# Patient Record
Sex: Female | Born: 1960 | Race: Black or African American | Hispanic: No | Smoking: Former smoker
Health system: Southern US, Community
[De-identification: ages and names within clinical notes are randomized; demographics above are authoritative.]

## PROBLEM LIST (undated history)

## (undated) DIAGNOSIS — I341 Nonrheumatic mitral (valve) prolapse: Secondary | ICD-10-CM

## (undated) DIAGNOSIS — R7303 Prediabetes: Secondary | ICD-10-CM

## (undated) DIAGNOSIS — G459 Transient cerebral ischemic attack, unspecified: Secondary | ICD-10-CM

## (undated) DIAGNOSIS — M5136 Other intervertebral disc degeneration, lumbar region: Secondary | ICD-10-CM

## (undated) DIAGNOSIS — I10 Essential (primary) hypertension: Secondary | ICD-10-CM

## (undated) DIAGNOSIS — M51369 Other intervertebral disc degeneration, lumbar region without mention of lumbar back pain or lower extremity pain: Secondary | ICD-10-CM

## (undated) DIAGNOSIS — K219 Gastro-esophageal reflux disease without esophagitis: Secondary | ICD-10-CM

## (undated) DIAGNOSIS — R569 Unspecified convulsions: Secondary | ICD-10-CM

## (undated) HISTORY — PX: JOINT REPLACEMENT: SHX530

## (undated) HISTORY — PX: BACK SURGERY: SHX140

## (undated) HISTORY — PX: ABDOMINAL HYSTERECTOMY: SHX81

## (undated) HISTORY — PX: KNEE SURGERY: SHX244

## (undated) HISTORY — PX: CHOLECYSTECTOMY: SHX55

---

## 1998-04-01 ENCOUNTER — Emergency Department (HOSPITAL_COMMUNITY): Admission: EM | Admit: 1998-04-01 | Discharge: 1998-04-01 | Payer: Self-pay | Admitting: Emergency Medicine

## 1998-04-13 ENCOUNTER — Other Ambulatory Visit: Admission: RE | Admit: 1998-04-13 | Discharge: 1998-04-13 | Payer: Self-pay | Admitting: Anesthesiology

## 1998-05-29 ENCOUNTER — Ambulatory Visit (HOSPITAL_COMMUNITY): Admission: RE | Admit: 1998-05-29 | Discharge: 1998-05-29 | Payer: Self-pay | Admitting: *Deleted

## 1998-06-04 ENCOUNTER — Encounter: Admission: RE | Admit: 1998-06-04 | Discharge: 1998-09-02 | Payer: Self-pay | Admitting: Family Medicine

## 1998-06-25 ENCOUNTER — Encounter: Payer: Self-pay | Admitting: Emergency Medicine

## 1998-06-25 ENCOUNTER — Emergency Department (HOSPITAL_COMMUNITY): Admission: EM | Admit: 1998-06-25 | Discharge: 1998-06-25 | Payer: Self-pay | Admitting: *Deleted

## 1998-11-10 ENCOUNTER — Emergency Department (HOSPITAL_COMMUNITY): Admission: EM | Admit: 1998-11-10 | Discharge: 1998-11-10 | Payer: Self-pay

## 1998-11-14 ENCOUNTER — Ambulatory Visit (HOSPITAL_COMMUNITY): Admission: RE | Admit: 1998-11-14 | Discharge: 1998-11-14 | Payer: Self-pay | Admitting: Family Medicine

## 1998-11-14 ENCOUNTER — Encounter: Payer: Self-pay | Admitting: Family Medicine

## 1998-12-10 ENCOUNTER — Ambulatory Visit (HOSPITAL_COMMUNITY): Admission: RE | Admit: 1998-12-10 | Discharge: 1998-12-10 | Payer: Self-pay | Admitting: Family Medicine

## 1999-05-31 ENCOUNTER — Ambulatory Visit (HOSPITAL_COMMUNITY): Admission: RE | Admit: 1999-05-31 | Discharge: 1999-06-01 | Payer: Self-pay | Admitting: Orthopaedic Surgery

## 1999-07-24 ENCOUNTER — Emergency Department (HOSPITAL_COMMUNITY): Admission: EM | Admit: 1999-07-24 | Discharge: 1999-07-24 | Payer: Self-pay | Admitting: Emergency Medicine

## 1999-07-24 ENCOUNTER — Encounter: Payer: Self-pay | Admitting: Emergency Medicine

## 1999-07-31 ENCOUNTER — Encounter: Admission: RE | Admit: 1999-07-31 | Discharge: 1999-10-29 | Payer: Self-pay | Admitting: Orthopaedic Surgery

## 1999-08-04 ENCOUNTER — Encounter: Payer: Self-pay | Admitting: Emergency Medicine

## 1999-08-04 ENCOUNTER — Emergency Department (HOSPITAL_COMMUNITY): Admission: EM | Admit: 1999-08-04 | Discharge: 1999-08-04 | Payer: Self-pay | Admitting: Emergency Medicine

## 2000-01-10 ENCOUNTER — Emergency Department (HOSPITAL_COMMUNITY): Admission: EM | Admit: 2000-01-10 | Discharge: 2000-01-10 | Payer: Self-pay | Admitting: Emergency Medicine

## 2000-01-10 ENCOUNTER — Encounter: Payer: Self-pay | Admitting: Emergency Medicine

## 2000-05-28 ENCOUNTER — Emergency Department (HOSPITAL_COMMUNITY): Admission: EM | Admit: 2000-05-28 | Discharge: 2000-05-28 | Payer: Self-pay | Admitting: Emergency Medicine

## 2001-07-17 ENCOUNTER — Encounter: Payer: Self-pay | Admitting: *Deleted

## 2001-07-17 ENCOUNTER — Emergency Department (HOSPITAL_COMMUNITY): Admission: EM | Admit: 2001-07-17 | Discharge: 2001-07-17 | Payer: Self-pay | Admitting: *Deleted

## 2001-07-21 ENCOUNTER — Encounter: Admission: RE | Admit: 2001-07-21 | Discharge: 2001-08-18 | Payer: Self-pay | Admitting: Orthopaedic Surgery

## 2002-04-18 ENCOUNTER — Other Ambulatory Visit: Admission: RE | Admit: 2002-04-18 | Discharge: 2002-04-18 | Payer: Self-pay | Admitting: Internal Medicine

## 2002-08-06 ENCOUNTER — Encounter: Payer: Self-pay | Admitting: Emergency Medicine

## 2002-08-06 ENCOUNTER — Emergency Department (HOSPITAL_COMMUNITY): Admission: EM | Admit: 2002-08-06 | Discharge: 2002-08-06 | Payer: Self-pay | Admitting: Emergency Medicine

## 2003-03-01 ENCOUNTER — Encounter
Admission: RE | Admit: 2003-03-01 | Discharge: 2003-05-09 | Payer: Self-pay | Admitting: Physical Medicine and Rehabilitation

## 2003-03-20 ENCOUNTER — Emergency Department (HOSPITAL_COMMUNITY): Admission: EM | Admit: 2003-03-20 | Discharge: 2003-03-20 | Payer: Self-pay | Admitting: Emergency Medicine

## 2003-04-10 ENCOUNTER — Encounter: Payer: Self-pay | Admitting: Internal Medicine

## 2003-04-10 ENCOUNTER — Ambulatory Visit (HOSPITAL_COMMUNITY): Admission: RE | Admit: 2003-04-10 | Discharge: 2003-04-10 | Payer: Self-pay | Admitting: Internal Medicine

## 2003-05-27 ENCOUNTER — Emergency Department (HOSPITAL_COMMUNITY): Admission: EM | Admit: 2003-05-27 | Discharge: 2003-05-27 | Payer: Self-pay | Admitting: Emergency Medicine

## 2003-11-17 ENCOUNTER — Other Ambulatory Visit: Admission: RE | Admit: 2003-11-17 | Discharge: 2003-11-17 | Payer: Self-pay | Admitting: Family Medicine

## 2003-11-21 ENCOUNTER — Encounter
Admission: RE | Admit: 2003-11-21 | Discharge: 2003-11-21 | Payer: Self-pay | Admitting: Physical Medicine and Rehabilitation

## 2003-11-23 ENCOUNTER — Ambulatory Visit (HOSPITAL_COMMUNITY): Admission: RE | Admit: 2003-11-23 | Discharge: 2003-11-23 | Payer: Self-pay | Admitting: Family Medicine

## 2004-01-13 ENCOUNTER — Emergency Department (HOSPITAL_COMMUNITY): Admission: EM | Admit: 2004-01-13 | Discharge: 2004-01-14 | Payer: Self-pay | Admitting: Emergency Medicine

## 2004-03-15 ENCOUNTER — Emergency Department (HOSPITAL_COMMUNITY): Admission: EM | Admit: 2004-03-15 | Discharge: 2004-03-15 | Payer: Self-pay | Admitting: *Deleted

## 2004-04-14 ENCOUNTER — Emergency Department (HOSPITAL_COMMUNITY): Admission: EM | Admit: 2004-04-14 | Discharge: 2004-04-14 | Payer: Self-pay | Admitting: Emergency Medicine

## 2004-06-21 ENCOUNTER — Ambulatory Visit: Payer: Self-pay | Admitting: *Deleted

## 2004-08-05 ENCOUNTER — Ambulatory Visit: Payer: Self-pay | Admitting: Nurse Practitioner

## 2004-09-03 ENCOUNTER — Ambulatory Visit: Payer: Self-pay | Admitting: Nurse Practitioner

## 2004-09-03 ENCOUNTER — Ambulatory Visit: Payer: Self-pay | Admitting: *Deleted

## 2004-11-11 ENCOUNTER — Ambulatory Visit: Payer: Self-pay | Admitting: Nurse Practitioner

## 2004-11-19 ENCOUNTER — Ambulatory Visit: Payer: Self-pay | Admitting: Nurse Practitioner

## 2004-12-05 ENCOUNTER — Ambulatory Visit: Payer: Self-pay | Admitting: Nurse Practitioner

## 2004-12-24 ENCOUNTER — Ambulatory Visit: Payer: Self-pay | Admitting: Nurse Practitioner

## 2005-01-08 ENCOUNTER — Ambulatory Visit (HOSPITAL_COMMUNITY): Admission: RE | Admit: 2005-01-08 | Discharge: 2005-01-08 | Payer: Self-pay | Admitting: Family Medicine

## 2005-05-01 ENCOUNTER — Ambulatory Visit: Payer: Self-pay | Admitting: Nurse Practitioner

## 2005-05-03 ENCOUNTER — Ambulatory Visit (HOSPITAL_COMMUNITY): Admission: RE | Admit: 2005-05-03 | Discharge: 2005-05-03 | Payer: Self-pay | Admitting: Internal Medicine

## 2005-06-05 ENCOUNTER — Ambulatory Visit: Payer: Self-pay | Admitting: Nurse Practitioner

## 2005-06-11 ENCOUNTER — Ambulatory Visit: Payer: Self-pay | Admitting: Nurse Practitioner

## 2005-06-18 ENCOUNTER — Ambulatory Visit (HOSPITAL_COMMUNITY): Admission: RE | Admit: 2005-06-18 | Discharge: 2005-06-18 | Payer: Self-pay | Admitting: Internal Medicine

## 2005-06-27 ENCOUNTER — Ambulatory Visit: Payer: Self-pay | Admitting: Nurse Practitioner

## 2005-07-21 ENCOUNTER — Encounter: Admission: RE | Admit: 2005-07-21 | Discharge: 2005-10-19 | Payer: Self-pay | Admitting: Sports Medicine

## 2005-09-04 ENCOUNTER — Ambulatory Visit: Payer: Self-pay | Admitting: Nurse Practitioner

## 2005-09-30 ENCOUNTER — Ambulatory Visit: Payer: Self-pay | Admitting: Nurse Practitioner

## 2005-10-04 ENCOUNTER — Ambulatory Visit (HOSPITAL_COMMUNITY): Admission: RE | Admit: 2005-10-04 | Discharge: 2005-10-04 | Payer: Self-pay | Admitting: Internal Medicine

## 2005-10-08 ENCOUNTER — Ambulatory Visit: Payer: Self-pay | Admitting: Nurse Practitioner

## 2006-01-06 ENCOUNTER — Ambulatory Visit: Payer: Self-pay | Admitting: Nurse Practitioner

## 2006-05-22 ENCOUNTER — Ambulatory Visit: Payer: Self-pay | Admitting: Nurse Practitioner

## 2006-05-24 ENCOUNTER — Emergency Department (HOSPITAL_COMMUNITY): Admission: EM | Admit: 2006-05-24 | Discharge: 2006-05-24 | Payer: Self-pay | Admitting: Emergency Medicine

## 2006-05-26 ENCOUNTER — Ambulatory Visit: Payer: Self-pay | Admitting: Nurse Practitioner

## 2006-06-06 ENCOUNTER — Emergency Department (HOSPITAL_COMMUNITY): Admission: EM | Admit: 2006-06-06 | Discharge: 2006-06-06 | Payer: Self-pay | Admitting: Emergency Medicine

## 2006-07-07 ENCOUNTER — Ambulatory Visit: Payer: Self-pay | Admitting: Nurse Practitioner

## 2006-08-12 ENCOUNTER — Ambulatory Visit: Payer: Self-pay | Admitting: Family Medicine

## 2006-09-11 ENCOUNTER — Ambulatory Visit: Payer: Self-pay | Admitting: Internal Medicine

## 2006-10-29 ENCOUNTER — Ambulatory Visit (HOSPITAL_COMMUNITY): Admission: RE | Admit: 2006-10-29 | Discharge: 2006-10-29 | Payer: Self-pay | Admitting: Obstetrics and Gynecology

## 2006-11-02 ENCOUNTER — Ambulatory Visit: Payer: Self-pay | Admitting: Obstetrics and Gynecology

## 2006-11-02 ENCOUNTER — Inpatient Hospital Stay (HOSPITAL_COMMUNITY): Admission: RE | Admit: 2006-11-02 | Discharge: 2006-11-04 | Payer: Self-pay | Admitting: Obstetrics and Gynecology

## 2006-11-02 ENCOUNTER — Encounter (INDEPENDENT_AMBULATORY_CARE_PROVIDER_SITE_OTHER): Payer: Self-pay | Admitting: Specialist

## 2006-11-11 ENCOUNTER — Ambulatory Visit: Payer: Self-pay | Admitting: Nurse Practitioner

## 2006-11-18 ENCOUNTER — Ambulatory Visit: Payer: Self-pay | Admitting: Obstetrics and Gynecology

## 2006-11-23 ENCOUNTER — Ambulatory Visit: Payer: Self-pay | Admitting: Nurse Practitioner

## 2006-12-07 ENCOUNTER — Emergency Department (HOSPITAL_COMMUNITY): Admission: EM | Admit: 2006-12-07 | Discharge: 2006-12-07 | Payer: Self-pay | Admitting: *Deleted

## 2006-12-16 ENCOUNTER — Ambulatory Visit: Payer: Self-pay | Admitting: Obstetrics & Gynecology

## 2007-02-10 ENCOUNTER — Ambulatory Visit: Payer: Self-pay | Admitting: Nurse Practitioner

## 2007-02-26 DIAGNOSIS — J309 Allergic rhinitis, unspecified: Secondary | ICD-10-CM | POA: Insufficient documentation

## 2007-02-26 DIAGNOSIS — F419 Anxiety disorder, unspecified: Secondary | ICD-10-CM | POA: Insufficient documentation

## 2007-02-26 DIAGNOSIS — K219 Gastro-esophageal reflux disease without esophagitis: Secondary | ICD-10-CM | POA: Insufficient documentation

## 2007-03-11 ENCOUNTER — Ambulatory Visit: Payer: Self-pay | Admitting: Internal Medicine

## 2007-04-05 ENCOUNTER — Encounter: Admission: RE | Admit: 2007-04-05 | Discharge: 2007-05-13 | Payer: Self-pay | Admitting: Sports Medicine

## 2007-05-11 ENCOUNTER — Ambulatory Visit: Payer: Self-pay | Admitting: Internal Medicine

## 2007-05-12 ENCOUNTER — Ambulatory Visit: Payer: Self-pay | Admitting: Family Medicine

## 2007-06-02 ENCOUNTER — Encounter (INDEPENDENT_AMBULATORY_CARE_PROVIDER_SITE_OTHER): Payer: Self-pay | Admitting: *Deleted

## 2007-06-04 ENCOUNTER — Ambulatory Visit: Payer: Self-pay | Admitting: Internal Medicine

## 2007-07-12 ENCOUNTER — Ambulatory Visit: Payer: Self-pay | Admitting: Family Medicine

## 2007-08-09 ENCOUNTER — Ambulatory Visit: Payer: Self-pay | Admitting: Internal Medicine

## 2007-09-05 ENCOUNTER — Emergency Department (HOSPITAL_COMMUNITY): Admission: EM | Admit: 2007-09-05 | Discharge: 2007-09-05 | Payer: Self-pay | Admitting: Emergency Medicine

## 2007-09-12 ENCOUNTER — Emergency Department (HOSPITAL_COMMUNITY): Admission: EM | Admit: 2007-09-12 | Discharge: 2007-09-12 | Payer: Self-pay | Admitting: Emergency Medicine

## 2007-10-21 ENCOUNTER — Ambulatory Visit: Payer: Self-pay | Admitting: Internal Medicine

## 2007-11-10 ENCOUNTER — Ambulatory Visit: Payer: Self-pay | Admitting: Family Medicine

## 2007-12-31 ENCOUNTER — Ambulatory Visit: Payer: Self-pay | Admitting: Internal Medicine

## 2008-06-23 ENCOUNTER — Ambulatory Visit: Payer: Self-pay | Admitting: *Deleted

## 2008-06-23 ENCOUNTER — Observation Stay (HOSPITAL_COMMUNITY): Admission: EM | Admit: 2008-06-23 | Discharge: 2008-06-24 | Payer: Self-pay | Admitting: Emergency Medicine

## 2008-07-02 ENCOUNTER — Emergency Department (HOSPITAL_COMMUNITY): Admission: EM | Admit: 2008-07-02 | Discharge: 2008-07-02 | Payer: Self-pay | Admitting: Emergency Medicine

## 2008-07-20 ENCOUNTER — Emergency Department (HOSPITAL_COMMUNITY): Admission: EM | Admit: 2008-07-20 | Discharge: 2008-07-20 | Payer: Self-pay | Admitting: Family Medicine

## 2008-08-07 ENCOUNTER — Ambulatory Visit: Payer: Self-pay | Admitting: Family Medicine

## 2008-08-07 ENCOUNTER — Encounter: Payer: Self-pay | Admitting: Family Medicine

## 2008-08-07 DIAGNOSIS — I10 Essential (primary) hypertension: Secondary | ICD-10-CM | POA: Insufficient documentation

## 2008-08-07 LAB — CONVERTED CEMR LAB
ALT: 13 units/L (ref 0–35)
AST: 12 units/L (ref 0–37)
Albumin: 4.8 g/dL (ref 3.5–5.2)
Alkaline Phosphatase: 61 units/L (ref 39–117)
BUN: 13 mg/dL (ref 6–23)
CO2: 24 meq/L (ref 19–32)
Calcium: 9.7 mg/dL (ref 8.4–10.5)
Chloride: 101 meq/L (ref 96–112)
Cholesterol: 245 mg/dL — ABNORMAL HIGH (ref 0–200)
Creatinine, Ser: 0.7 mg/dL (ref 0.40–1.20)
Glucose, Bld: 100 mg/dL — ABNORMAL HIGH (ref 70–99)
HDL: 45 mg/dL (ref >39)
LDL Cholesterol: 154 mg/dL — ABNORMAL HIGH (ref 0–99)
Potassium: 4.3 meq/L (ref 3.5–5.3)
Sodium: 138 meq/L (ref 135–145)
Total Bilirubin: 0.6 mg/dL (ref 0.3–1.2)
Total CHOL/HDL Ratio: 5.4
Total Protein: 7.3 g/dL (ref 6.0–8.3)
Triglycerides: 232 mg/dL — ABNORMAL HIGH (ref <150)
VLDL: 46 mg/dL — ABNORMAL HIGH (ref 0–40)

## 2008-08-14 ENCOUNTER — Telehealth: Payer: Self-pay | Admitting: Family Medicine

## 2008-08-14 ENCOUNTER — Encounter: Payer: Self-pay | Admitting: *Deleted

## 2008-08-17 ENCOUNTER — Telehealth: Payer: Self-pay | Admitting: Family Medicine

## 2008-08-18 ENCOUNTER — Emergency Department (HOSPITAL_COMMUNITY): Admission: EM | Admit: 2008-08-18 | Discharge: 2008-08-18 | Payer: Self-pay | Admitting: Emergency Medicine

## 2008-08-30 ENCOUNTER — Encounter: Payer: Self-pay | Admitting: Family Medicine

## 2008-09-13 ENCOUNTER — Encounter: Payer: Self-pay | Admitting: Family Medicine

## 2008-10-03 ENCOUNTER — Telehealth: Payer: Self-pay | Admitting: *Deleted

## 2008-10-06 ENCOUNTER — Ambulatory Visit (HOSPITAL_COMMUNITY): Admission: RE | Admit: 2008-10-06 | Discharge: 2008-10-06 | Payer: Self-pay | Admitting: Family Medicine

## 2009-01-08 ENCOUNTER — Ambulatory Visit: Payer: Self-pay | Admitting: Family Medicine

## 2009-02-13 ENCOUNTER — Ambulatory Visit: Payer: Self-pay | Admitting: Family Medicine

## 2009-02-13 DIAGNOSIS — E669 Obesity, unspecified: Secondary | ICD-10-CM | POA: Insufficient documentation

## 2009-02-15 DIAGNOSIS — M129 Arthropathy, unspecified: Secondary | ICD-10-CM | POA: Insufficient documentation

## 2009-02-16 ENCOUNTER — Ambulatory Visit: Payer: Self-pay | Admitting: Cardiology

## 2009-02-23 ENCOUNTER — Encounter: Payer: Self-pay | Admitting: Cardiology

## 2009-02-23 ENCOUNTER — Ambulatory Visit: Payer: Self-pay

## 2009-03-16 ENCOUNTER — Telehealth (INDEPENDENT_AMBULATORY_CARE_PROVIDER_SITE_OTHER): Payer: Self-pay | Admitting: Family Medicine

## 2009-03-23 ENCOUNTER — Ambulatory Visit: Payer: Self-pay | Admitting: Family Medicine

## 2009-05-03 ENCOUNTER — Ambulatory Visit: Payer: Self-pay | Admitting: Family Medicine

## 2009-05-03 DIAGNOSIS — R51 Headache: Secondary | ICD-10-CM | POA: Insufficient documentation

## 2009-05-03 DIAGNOSIS — R519 Headache, unspecified: Secondary | ICD-10-CM | POA: Insufficient documentation

## 2009-07-06 ENCOUNTER — Telehealth: Payer: Self-pay | Admitting: *Deleted

## 2010-06-05 ENCOUNTER — Ambulatory Visit: Payer: Self-pay | Admitting: Family Medicine

## 2010-06-05 DIAGNOSIS — N951 Menopausal and female climacteric states: Secondary | ICD-10-CM | POA: Insufficient documentation

## 2010-06-06 ENCOUNTER — Encounter: Payer: Self-pay | Admitting: Family Medicine

## 2010-06-06 LAB — CONVERTED CEMR LAB
Progesterone: 1 ng/mL
TSH: 2.576 microintl units/mL (ref 0.350–4.500)

## 2010-06-13 ENCOUNTER — Telehealth: Payer: Self-pay | Admitting: Family Medicine

## 2010-07-03 ENCOUNTER — Ambulatory Visit: Payer: Self-pay | Admitting: Family Medicine

## 2010-07-03 DIAGNOSIS — H538 Other visual disturbances: Secondary | ICD-10-CM | POA: Insufficient documentation

## 2010-07-05 ENCOUNTER — Encounter: Payer: Self-pay | Admitting: Family Medicine

## 2010-07-11 ENCOUNTER — Ambulatory Visit: Payer: Self-pay | Admitting: Family Medicine

## 2010-07-11 DIAGNOSIS — G4700 Insomnia, unspecified: Secondary | ICD-10-CM | POA: Insufficient documentation

## 2010-07-11 HISTORY — DX: Insomnia, unspecified: G47.00

## 2010-07-26 ENCOUNTER — Encounter: Payer: Self-pay | Admitting: Family Medicine

## 2010-07-26 ENCOUNTER — Telehealth: Payer: Self-pay | Admitting: Family Medicine

## 2010-09-20 ENCOUNTER — Ambulatory Visit: Admission: RE | Admit: 2010-09-20 | Discharge: 2010-09-20 | Payer: Self-pay | Source: Home / Self Care

## 2010-09-20 DIAGNOSIS — H1045 Other chronic allergic conjunctivitis: Secondary | ICD-10-CM | POA: Insufficient documentation

## 2010-10-01 ENCOUNTER — Ambulatory Visit: Admit: 2010-10-01 | Payer: Self-pay | Admitting: Family Medicine

## 2010-10-06 ENCOUNTER — Encounter: Payer: Self-pay | Admitting: Neurosurgery

## 2010-10-06 ENCOUNTER — Encounter: Payer: Self-pay | Admitting: Internal Medicine

## 2010-10-06 ENCOUNTER — Encounter: Payer: Self-pay | Admitting: Family Medicine

## 2010-10-06 ENCOUNTER — Encounter: Payer: Self-pay | Admitting: *Deleted

## 2010-10-14 ENCOUNTER — Encounter
Admission: RE | Admit: 2010-10-14 | Discharge: 2010-10-15 | Payer: Self-pay | Source: Home / Self Care | Attending: Sports Medicine | Admitting: Sports Medicine

## 2010-10-17 NOTE — Miscellaneous (Signed)
Summary: VIP  Patient: Mariah [name] Note: All result statuses are Final unless otherwise noted.  Tests: (1) VIP (Medications)   LLIMPORTMEDS              "Result Below..."       RESULT: PROTONIX TBEC 40 MG*TAKE ONE (1) TABLET BY MOUTH EVERY DAY*07/13/2007*Last Refill: [id number]*******   LLIMPORTALLS              "Result Below..."       RESULT: SULFITE, SULFATE***  Note: An exclamation mark (!) indicates a result that was not dispersed into the flowsheet. Document Creation Date: 07/15/2007 3:05 PM _______________________________________________________________________  (1) Order result status: Final Collection or observation date-time: 06/02/2007 Requested date-time: 06/02/2007 Receipt date-time:  Reported date-time: 06/02/2007 Referring Physician:   Ordering Physician:   Specimen Source:  Source: [facility] [place] Order Number:  Lab site:

## 2010-10-17 NOTE — Assessment & Plan Note (Signed)
Summary: nutr. appt per saunders/eo   Vital Signs:  Patient profile:   50 year old female Height:      65 inches Weight:      226 pounds BMI:     37.74  Vitals Entered By: Wyona Almas PHD (July 11, 2010 2:03 PM)  Primary Care Provider:  Angelena Sole   History of Present Illness: 1. F/U on obesity and nutrition.  Spent > 30 minutes in counseling, education, and discussing the treatment plan.  She has been compliant with the diet for the most part over the past 7 days  24 hour recall: Breakfast: 4 egg whites / 2 slices bacon + water + coffee with [place] creamer Snack: Pistachios Lunch: Skipped Snack: None Dinner: baked chicken with vegetables  Cravings: starchy foods  2. Cough:  She has also had a cough over the past couple days and is looking for something to help break up the congestion in her chest  ROS: denies wheezing, fever, or shortness of breath  3. Insomnia:  Pt has trouble staying asleep at night.  She has no problem falling asleep but wakes up in the middle of the night because of her back pain and has trouble falling back asleep.  ROS: denies racing thoughts  Current Problems (verified): 1)  Cough  (ICD-786.2) 2)  Insomnia Unspecified  (ICD-780.52) 3)  Shoulder Pain, Right  (ICD-719.41) 4)  Other Specified Visual Disturbances  (ICD-368.8) 5)  Sinusitis, Chronic  (ICD-473.9) 6)  Hot Flashes  (ICD-627.2) 7)  Headache  (ICD-784.0) 8)  Hypertension, Benign  (ICD-401.1) 9)  Arthritis  (ICD-716.90) 10)  Obesity  (ICD-278.00) 11)  Gerd  (ICD-530.81) 12)  Neck Pain, Chronic  (ICD-723.1) 13)  Low Back Pain, Chronic  (ICD-724.2) 14)  Anxiety State Nec  (ICD-300.09) 15)  Allergic Rhinitis, Seasonal, Mild  (ICD-477.9)  Current Medications (verified): 1)  Xanax 1 Mg  Tabs (Alprazolam) .... Three Times A Day As Needed For Anxiety 2)  Flonase 50 Mcg/act  Susp (Fluticasone Propionate) .Marland Kitchen.. 1 Squirt Each Nares Two Times A Day As Needed For Allergies 3)   Hydrochlorothiazide 25 Mg Tabs (Hydrochlorothiazide) .... Take 1 Tab By Mouth in The Morning 4)  Atenolol 50 Mg Tabs (Atenolol) .... Take One Tab By Mouth Daily 5)  Flexeril 10 Mg Tabs (Cyclobenzaprine Hcl) .... Take 1 Tab By Mouth Three Times A Day As Needed For Muscle Spasm 6)  Omeprazole 20 Mg Tbec (Omeprazole) .... Take 1-2 Tabs By Mouth Daily 7)  Multivitamins  Tabs (Multiple Vitamin) .... Take 1 Tab By Mouth Daily 8)  Claritin 10 Mg Tabs (Loratadine) 9)  Wal-Tussin 100 Mg/11ml Syrp (Guaifenesin) .Marland Kitchen.. 1 Teaspoon By Mouth Twice A Day As Needed For Cough Dispo: 1 Bottle  Allergies: 1)  ! Sulfa 2)  ! * Avelox 3)  ! * Humibid 4)  ! Lisinopril (Lisinopril)  Social History: Reviewed history from 07/03/2010 and no changes required. Daughter [place] Not employed Limited exercise - use to walk 3 miles per day but cannot now because of pain Tobacco Use - No.  Alcohol Use - yes Disabled   Physical Exam  General:  Alert, well-hydrated, and obese  Lungs:  normal respiratory effort.  normal breath sounds, no crackles, and no wheezes.   Heart:  normal rate and regular rhythm.   Abdomen:  obese Extremities:  No lower extremity edema   Impression & Recommendations:  Problem # 1:  OBESITY (ICD-278.00) Assessment Improved  3 lb weight loss since last week with the  diet.  Made some further recommendations.  Please see instructions.  Orders: [facility]- Est  Level 4 ([address])  Problem # 2:  COUGH (ICD-786.2) Assessment: New  Minor cough.  No red flags.  Conservative management.  Orders: [facility]- Est  Level 4 ([address])  Problem # 3:  INSOMNIA UNSPECIFIED (ICD-780.52) Assessment: New  Recommended her to not take the Percocet before bed but wait until she wakes up because of pain and then take it.  Hopefully this will help her fall asleep sooner.  Orders: [facility]- Est  Level 4 ([address])  Complete Medication List: 1)  Xanax 1 Mg Tabs (Alprazolam) .... Three times a day as needed for anxiety 2)   Flonase 50 Mcg/act Susp (Fluticasone propionate) .Marland Kitchen.. 1 squirt each nares two times a day as needed for allergies 3)  Hydrochlorothiazide 25 Mg Tabs (Hydrochlorothiazide) .... Take 1 tab by mouth in the morning 4)  Atenolol 50 Mg Tabs (Atenolol) .... Take one tab by mouth daily 5)  Flexeril 10 Mg Tabs (Cyclobenzaprine hcl) .... Take 1 tab by mouth three times a day as needed for muscle spasm 6)  Omeprazole 20 Mg Tbec (Omeprazole) .... Take 1-2 tabs by mouth daily 7)  Multivitamins Tabs (Multiple vitamin) .... Take 1 tab by mouth daily 8)  Claritin 10 Mg Tabs (Loratadine) 9)  Wal-tussin 100 Mg/33ml Syrp (Guaifenesin) .Marland Kitchen.. 1 teaspoon by mouth twice a day as needed for cough dispo: 1 bottle  Patient Instructions: 1)  Good job on your diet.  I'm proud of your weight loss. 2)  Keep it up. 3)  We have recommended some changes to your diet including: 4)  -Eating meals more frequently 5)  -Here is your new meal schedule: breakfast 7:30, snack 10:00, lunch 1:00, Snack 4:00, and Dinner 7:00 6)  Do not skip lunch 7)  Try and cut out the creamer and juice 8)  One meal a week you can eat what you want 9)  Please schedule a follow up in 2 weeks to check on your weight loss Prescriptions: WAL-TUSSIN 100 MG/5ML SYRP (GUAIFENESIN) 1 teaspoon by mouth twice a day as needed for cough Dispo: 1 bottle  #1 x 3   Entered and Authorized by:   Angelena Sole MD   Signed by:   Angelena Sole MD on 07/11/2010   Method used:   Electronically to        [facility] [phone]* (retail)       [address]       [place], [place]  [address]       Ph: [phone]       Fax: [phone]   RxID:   [id number]    Orders Added: 1)  [facility]- Est  Level 4 [[address]]

## 2010-10-17 NOTE — Progress Notes (Signed)
Summary: pharmacy info  Medications Added OMEPRAZOLE 20 MG TBEC (OMEPRAZOLE) Take 1-2 tabs by mouth daily      medicaid denied nexium for pt. denial form to md chart box. she did not meet criteria.Kennon Rounds CALDWELL RN  August 17, 2008 11:14 AM  Phone Note Outgoing Call Call back at [facility] Phone [phone]   Call placed by: Angelena Sole MD,  August 21, 2008 2:02 PM Summary of Call: Told pt. about medicaid denial form and that I would be switching her Nexium to a generic form of that.  Requested her to call the clinic with the pharmacy that she uses and that I will then send the prescription there.  Follow-up for Phone Call        [facility] [address] Follow-up by: De Nurse,  August 21, 2008 2:09 PM  Additional Follow-up for Phone Call Additional follow up Details #1::        Please inform pt. that prescription has been sent to her pharmacy    New/Updated Medications: OMEPRAZOLE 20 MG TBEC (OMEPRAZOLE) Take 1-2 tabs by mouth daily   Prescriptions: OMEPRAZOLE 20 MG TBEC (OMEPRAZOLE) Take 1-2 tabs by mouth daily  #60 x 2   Entered and Authorized by:   Angelena Sole MD   Signed by:   Angelena Sole MD on 08/22/2008   Method used:   Electronically to        [facility] [phone]* (retail)       [address]       [place], [place]  [address]       Ph: [phone]       Fax: [phone]   RxID:   [id number]

## 2010-10-17 NOTE — Assessment & Plan Note (Signed)
Summary: DIZZINESS/ & PAIN OVER LF EYE OFF AND ON/[facility]   Vital Signs:  Patient profile:   50 year old female Height:      65 inches Weight:      225.8 pounds BMI:     37.71 Temp:     98.2 degrees F oral Pulse rate:   71 / minute BP sitting:   126 / 69  (left arm) Cuff size:   large  Vitals Entered By: Garen Grams LPN (September 20, 2010 9:06 AM) CC: frequent headaches Is Patient Diabetic? No Pain Assessment Patient in pain? no        Primary Care Provider:  Angelena Sole  CC:  frequent headaches.  History of Present Illness: 1. Frequent headaches:  Pt has been having headaches off and on for the past couple of weeks.  Located on the left side of her head.  They come and go.  Only last for about 4 minutes and then go away on there own.  Sometimes she has to take BC's and they help.  She has been under a lot of stress because she has recently closed on a house.  ROS: denies numbness, weakness, gait problems, being off balance.  Denies photophobia, phonophobia, n/v  2. Left eye complaints:  She complains of waking up with "junk" in her left eye and feeling like her left eye is puffy.  It is normal today but she is not sure why it keeps happening.   She was having visual disturbances in that eye at last visit and was sent to an opthalmologist but she hasn't had any problems since then.  ROS: denies vision changes  Habits & Providers  Alcohol-Tobacco-Diet     Tobacco Status: never     Year Quit: 1997     Pack years: 20  Current Medications (verified): 1)  Xanax 1 Mg  Tabs (Alprazolam) .... Three Times A Day As Needed For Anxiety 2)  Flonase 50 Mcg/act  Susp (Fluticasone Propionate) .Marland Kitchen.. 1 Squirt Each Nares Two Times A Day As Needed For Allergies 3)  Hydrochlorothiazide 25 Mg Tabs (Hydrochlorothiazide) .... Take 1 Tab By Mouth in The Morning 4)  Atenolol 50 Mg Tabs (Atenolol) .... Take One Tab By Mouth Daily 5)  Flexeril 10 Mg Tabs (Cyclobenzaprine Hcl) .... Take 1 Tab By  Mouth Three Times A Day As Needed For Muscle Spasm 6)  Omeprazole 20 Mg Tbec (Omeprazole) .... Take 1-2 Tabs By Mouth Daily 7)  Multivitamins  Tabs (Multiple Vitamin) .... Take 1 Tab By Mouth Daily 8)  Eye Allergy Relief 0.025-0.3 % Soln (Naphazoline-Pheniramine) .... 2 Drops Into Affected Eye Twice A Day  Allergies: 1)  ! Sulfa 2)  ! * Avelox 3)  ! * Humibid 4)  ! Lisinopril (Lisinopril)  Past History:  Past Medical History: Reviewed history from 02/16/2009 and no changes required. Current Problems:  PALPITATIONS (ICD-785.1) HYPERTENSION, BENIGN (ICD-401.1) Mild hyperlipidemia ARTHRITIS (ICD-716.90) OBESITY (ICD-278.00) ACUTE BRONCHITIS (ICD-466.0) OTHER SCREENING MAMMOGRAM (ICD-V76.12) SCREENING FOR DIABETES MELLITUS (ICD-V77.1) GERD (ICD-530.81) NECK PAIN, CHRONIC (ICD-723.1) LOW BACK PAIN, CHRONIC (ICD-724.2) ANXIETY STATE NEC (ICD-300.09) ALLERGIC RHINITIS, SEASONAL, MILD (ICD-477.9) mumur  Mitral valve prolapse  Social History: Reviewed history from 07/03/2010 and no changes required. Daughter [place] Not employed Limited exercise - use to walk 3 miles per day but cannot now because of pain Tobacco Use - No.  Alcohol Use - yes Disabled   Physical Exam  General:  Alert, well-hydrated, and obese. No acute distress Head:  normocephalic and  atraumatic.   Eyes:  vision grossly intact.  PERRL.  Fundoscopic exam benign Ears:  R ear normal and L ear normal.   Nose:  no external deformity and no nasal discharge.   Mouth:  good dentition and pharynx pink and moist.   Neck:  supple, full ROM, and no masses.  No carotid bruits Lungs:  normal respiratory effort.  normal breath sounds, no crackles, and no wheezes.   Heart:  normal rate and regular rhythm.   Neurologic:  alert & oriented X3, cranial nerves II-XII intact, strength normal in all extremities, sensation intact to light touch, and gait normal.     Impression & Recommendations:  Problem # 1:  HEADACHE  (ICD-784.0) Assessment New  Migraine vs tension type headaches.  She has been under a lot of stress recently with closing on a house.  No red flags.  Tylenol / Ibuprofen as needed. Her updated medication list for this problem includes:    Atenolol 50 Mg Tabs (Atenolol) .Marland Kitchen... Take one tab by mouth daily  Orders: [facility]- Est  Level 4 ([address])  Problem # 2:  OTHER CHRONIC ALLERGIC CONJUNCTIVITIS (ICD-372.14) Assessment: New  Unsure if this is an allergic conjunctivitis or not.  Will treat with antihistamine eye drops.    Orders: [facility]- Est  Level 4 ([address])  Problem # 3:  OTHER SPECIFIED VISUAL DISTURBANCES (ICD-368.8) Assessment: Improved She has not had more of the monocular visual disturbance but the ophtlamologist wanted to get carotid dopplers so will send her for this.  Will also have her follow up with the Opthalmologist as requested. Orders: Carotid Doppler (Carotid doppler) [facility]- Est  Level 4 ([address])  Complete Medication List: 1)  Xanax 1 Mg Tabs (Alprazolam) .... Three times a day as needed for anxiety 2)  Flonase 50 Mcg/act Susp (Fluticasone propionate) .Marland Kitchen.. 1 squirt each nares two times a day as needed for allergies 3)  Hydrochlorothiazide 25 Mg Tabs (Hydrochlorothiazide) .... Take 1 tab by mouth in the morning 4)  Atenolol 50 Mg Tabs (Atenolol) .... Take one tab by mouth daily 5)  Flexeril 10 Mg Tabs (Cyclobenzaprine hcl) .... Take 1 tab by mouth three times a day as needed for muscle spasm 6)  Omeprazole 20 Mg Tbec (Omeprazole) .... Take 1-2 tabs by mouth daily 7)  Multivitamins Tabs (Multiple vitamin) .... Take 1 tab by mouth daily 8)  Eye Allergy Relief 0.025-0.3 % Soln (Naphazoline-pheniramine) .... 2 drops into affected eye twice a day  Patient Instructions: 1)  It was nice to see you today 2)  I am not sure what is causing your headache and left eye complaints 3)  Try and get back on your diet 4)  I am going to send in a prescription for eye drops 5)  I am also going  to send you for a doppler exam of your neck to make sure that your carotid arteries are okay 6)  The ophtalmologist wanted to see you back as a follow up so please call and schedule an appointment after you have the carotid study done 7)  Please schedule an appointment in 4-6 weeks to check on you Prescriptions: EYE ALLERGY RELIEF 0.025-0.3 % SOLN (NAPHAZOLINE-PHENIRAMINE) 2 drops into affected eye twice a day  #1 x 1   Entered and Authorized by:   Angelena Sole MD   Signed by:   Angelena Sole MD on 09/20/2010   Method used:   Electronically to        [facility] [phone]* (  retail)       [address]       [place], [place]  [address]       Ph: [phone]       Fax: [phone]   RxID:   [id number] EYE ALLERGY RELIEF 0.025-0.3 % SOLN (NAPHAZOLINE-PHENIRAMINE) 2 drops into affected eye twice a day  #1 x 1   Entered and Authorized by:   Angelena Sole MD   Signed by:   Angelena Sole MD on 09/20/2010   Method used:   Print then Give to Patient   RxID:   [id number] Prudy Feeler 1 MG  TABS (ALPRAZOLAM) three times a day as needed for anxiety  #30 x 0   Entered and Authorized by:   Angelena Sole MD   Signed by:   Angelena Sole MD on 09/20/2010   Method used:   Print then Give to Patient   RxID:   [id number]    Orders Added: 1)  Carotid Doppler [Carotid doppler] 2)  [facility]- Est  Level 4 [[address]]

## 2010-10-17 NOTE — Assessment & Plan Note (Signed)
Summary: f/u eo   Vital Signs:  Patient profile:   50 year old female Height:      65 inches Weight:      229.5 pounds BMI:     38.33 Temp:     97.9 degrees F oral Pulse rate:   59 / minute BP sitting:   148 / 74  (left arm) Cuff size:   large  Vitals Entered By: Garen Grams LPN (July 03, 2010 9:45 AM) CC: f/u hot flashes, weight gain, sinusitis, vision problems Is Patient Diabetic? No Pain Assessment Patient in pain? no        Primary Care Provider:  Angelena Sole  CC:  f/u hot flashes, weight gain, sinusitis, and vision problems.  History of Present Illness: 1. Hot flashes:  They are persistent.  Still getting them a couple of times a week.    2. Obesity:  Continues to gain weight.  Has tried cutting back on potatoes and bread but still eating them.  Doesn't exercise  3. Right shoulder pain:  Has had this for years.  Sees Dr. Farris Has (Ortho) for her chronic neck pain and has told him about this.  States that x-rays just show arthritis.  4. Vision changes:  Sometimes she sees starts and occassionally she has had a vail come into her left peripheral field.  5. Chronic sinusitis:  she is still feeling sinus congestion and pain.  She tried the Afrin and Saline nasal spray without any benefit.  ROS:  Denies fevers, weight loss, chills, hearing loss, sore throat, teeth pain           Endorses headaches  Habits & Providers  Alcohol-Tobacco-Diet     Tobacco Status: never     Year Quit: 1997     Pack years: 20  Current Medications (verified): 1)  Xanax 1 Mg  Tabs (Alprazolam) .... Three Times A Day As Needed For Anxiety 2)  Flonase 50 Mcg/act  Susp (Fluticasone Propionate) .Marland Kitchen.. 1 Squirt Each Nares Two Times A Day As Needed For Allergies 3)  Hydrochlorothiazide 25 Mg Tabs (Hydrochlorothiazide) .... Take 1 Tab By Mouth in The Morning 4)  Atenolol 50 Mg Tabs (Atenolol) .... Take One Tab By Mouth Daily 5)  Flexeril 10 Mg Tabs (Cyclobenzaprine Hcl) .... Take 1 Tab By  Mouth Three Times A Day As Needed For Muscle Spasm 6)  Omeprazole 20 Mg Tbec (Omeprazole) .... Take 1-2 Tabs By Mouth Daily 7)  Multivitamins  Tabs (Multiple Vitamin) .... Take 1 Tab By Mouth Daily 8)  Claritin 10 Mg Tabs (Loratadine) 9)  Amoxicillin-Pot Clavulanate 1000-62.5 Mg Xr12h-Tab (Amoxicillin-Pot Clavulanate) .Marland Kitchen.. 1 Tab By Mouth Twice A Day For 10 Days  Allergies: 1)  ! Sulfa 2)  ! * Avelox 3)  ! * Humibid 4)  ! Lisinopril (Lisinopril)  Past History:  Past Medical History: Reviewed history from 02/16/2009 and no changes required. Current Problems:  PALPITATIONS (ICD-785.1) HYPERTENSION, BENIGN (ICD-401.1) Mild hyperlipidemia ARTHRITIS (ICD-716.90) OBESITY (ICD-278.00) ACUTE BRONCHITIS (ICD-466.0) OTHER SCREENING MAMMOGRAM (ICD-V76.12) SCREENING FOR DIABETES MELLITUS (ICD-V77.1) GERD (ICD-530.81) NECK PAIN, CHRONIC (ICD-723.1) LOW BACK PAIN, CHRONIC (ICD-724.2) ANXIETY STATE NEC (ICD-300.09) ALLERGIC RHINITIS, SEASONAL, MILD (ICD-477.9) mumur  Mitral valve prolapse  Social History: Reviewed history from 02/16/2009 and no changes required. Daughter [place] Not employed Limited exercise - use to walk 3 miles per day but cannot now because of pain Tobacco Use - No.  Alcohol Use - yes Disabled   Physical Exam  General:  Vitals reviewed.  alert, well-hydrated, and  overweight-appearing.   Head:  normocephalic and atraumatic.  Eyes:  vision grossly intact.  PERRL.  Fundoscopic exam benign.  Retina appears intact. Ears:  R ear normal and L ear normal.   Nose:  no external deformity and no nasal discharge.  Mildly TTP to the maxillary and frontal sinuses. Mouth:  good dentition and pharynx pink and moist.   Neck:  supple, full ROM, and no masses.   Lungs:  normal respiratory effort.  normal breath sounds, no crackles, and no wheezes.   Heart:  normal rate and regular rhythm.   Extremities:  No lower extremity edema Neurologic:  alert & oriented X3 and cranial nerves  II-XII intact.   Psych:  not anxious appearing and not depressed appearing.     Impression & Recommendations:  Problem # 1:  HOT FLASHES (ICD-627.2) Assessment Unchanged  Still persists.  Likely from too much estrogen related to her obesity.  Will continue to work on losing weight.  May benefit from some phytoestrogens.  Orders: [facility]- Est  Level 4 ([address])  Problem # 2:  SINUSITIS, CHRONIC (ICD-473.9) Assessment: Unchanged  Persists.  Will try antibiotics in case there is a chronic infection.   The following medications were removed from the medication list:    12 Hour Nasal Spray 0.05 % Soln (Oxymetazoline hcl) .Marland Kitchen... 2-3 sprays in each nostril for 4 days    [place] Spray 0.65 % Soln (Saline) .Marland Kitchen... 2-3 sprays three times a day, start in 5 days Her updated medication list for this problem includes:    Flonase 50 Mcg/act Susp (Fluticasone propionate) .Marland Kitchen... 1 squirt each nares two times a day as needed for allergies    Amoxicillin-pot Clavulanate 1000-62.5 Mg Xr12h-tab (Amoxicillin-pot clavulanate) .Marland Kitchen... 1 tab by mouth twice a day for 10 days  Orders: [facility]- Est  Level 4 ([address])  Problem # 3:  OTHER SPECIFIED VISUAL DISTURBANCES (ICD-368.8) Assessment: New Fundoscopic exam looks okay.  Will refer to Ophthalmology for evaluation and treatment. Orders: Ophthalmology Referral (Ophthalmology) [facility]- Est  Level 4 ([address])  Problem # 4:  OBESITY (ICD-278.00) Assessment: Deteriorated Still gaining weight.  Recommended a stricter diet.  Will have her come back and see me in nutrition clinic where we can focus more on the diet. Orders: [facility]- Est  Level 4 ([address])  Complete Medication List: 1)  Xanax 1 Mg Tabs (Alprazolam) .... Three times a day as needed for anxiety 2)  Flonase 50 Mcg/act Susp (Fluticasone propionate) .Marland Kitchen.. 1 squirt each nares two times a day as needed for allergies 3)  Hydrochlorothiazide 25 Mg Tabs (Hydrochlorothiazide) .... Take 1 tab by mouth in the morning 4)  Atenolol  50 Mg Tabs (Atenolol) .... Take one tab by mouth daily 5)  Flexeril 10 Mg Tabs (Cyclobenzaprine hcl) .... Take 1 tab by mouth three times a day as needed for muscle spasm 6)  Omeprazole 20 Mg Tbec (Omeprazole) .... Take 1-2 tabs by mouth daily 7)  Multivitamins Tabs (Multiple vitamin) .... Take 1 tab by mouth daily 8)  Claritin 10 Mg Tabs (Loratadine) 9)  Amoxicillin-pot Clavulanate 1000-62.5 Mg Xr12h-tab (Amoxicillin-pot clavulanate) .Marland Kitchen.. 1 tab by mouth twice a day for 10 days  Patient Instructions: 1)  It was good to see you again today 2)  We will continue to work on your weight loss 3)  Try the following diet 4)  -Breakfast: Egg whites 5)  -Lunch: Salad with chicken 6)  -Dinner: Vegetables with meat of choic 7)  -Snack: Almonds or pistachios 8)  We  will get you set up to see an eye doctor 9)  We will also try an antibiotic to help you with your sinuses 10)  Please schedule an appointment with me in the Nutrition Clinic on October 27th Prescriptions: AMOXICILLIN-POT CLAVULANATE 1000-62.5 MG XR12H-TAB (AMOXICILLIN-POT CLAVULANATE) 1 tab by mouth twice a day for 10 days  #20 x 0   Entered and Authorized by:   Angelena Sole MD   Signed by:   Angelena Sole MD on 07/03/2010   Method used:   Electronically to        [facility] [phone]* (retail)       [address]       [place], [place]  [address]       Ph: [phone]       Fax: [phone]   RxID:   [id number]    Orders Added: 1)  Ophthalmology Referral [Ophthalmology] 2)  [facility]- Est  Level 4 [[address]]

## 2010-10-17 NOTE — Consult Note (Signed)
Summary: [facility] Ophthalmology   Imported By: Knox Royalty 07/18/2010 10:02:46  _____________________________________________________________________  External Attachment:    Type:   Image     Comment:   External Document

## 2010-10-17 NOTE — Assessment & Plan Note (Signed)
Summary: f/u visit/bmc   Vital Signs:  Patient profile:   50 year old female Height:      65 inches Weight:      224.6 pounds BMI:     37.51 Temp:     98.5 degrees F oral Pulse rate:   72 / minute BP sitting:   132 / 85  (left arm) Cuff size:   large  Vitals Entered By: Garen Grams LPN (June 05, 2010 3:08 PM) Is Patient Diabetic? No Pain Assessment Patient in pain? no        Primary Care Jaclyne Haverstick:  Angelena Sole   History of Present Illness: 1. Anxiety - Overall it is well controlled - She does need a refill on her Xanax - Stress fairly well controlled - Her daughter and granddaughter just moved out  Denies excessive energy or compulsive behavior  2. Hot flashes - Had TAH / BSO in 2007 - Has had hot flashes since then - Periods where she gets real hot and sweats  ROS: denies heart racing  3. Chronic Sinusitis: - Worse in the morning - Been dealing with this for months - Has to blow her nose in the morning  ROS: denies fevers, headaches, or sinus pain  4. Finger nodules - Noticed nodules on both middle fingers a couple of months ago. - They come and go - They are not painful, red, or limit her motion  ROS: denies other joint pain or skin issues  5. Obesity - Has been gaining weight - Would like to lose some weight - Has been eating a lot of potatoes and bread  Habits & Providers  Alcohol-Tobacco-Diet     Tobacco Status: never     Year Quit: 1997     Pack years: 20  Current Medications (verified): 1)  Xanax 1 Mg  Tabs (Alprazolam) .... Three Times A Day As Needed For Anxiety 2)  Flonase 50 Mcg/act  Susp (Fluticasone Propionate) .Marland Kitchen.. 1 Squirt Each Nares Two Times A Day As Needed For Allergies 3)  Hydrochlorothiazide 25 Mg Tabs (Hydrochlorothiazide) .... Take 1 Tab By Mouth in The Morning 4)  Atenolol 50 Mg Tabs (Atenolol) .... Take One Tab By Mouth Daily 5)  Flexeril 10 Mg Tabs (Cyclobenzaprine Hcl) .... Take 1 Tab By Mouth Three Times A  Day As Needed For Muscle Spasm 6)  Omeprazole 20 Mg Tbec (Omeprazole) .... Take 1-2 Tabs By Mouth Daily 7)  Multivitamins  Tabs (Multiple Vitamin) .... Take 1 Tab By Mouth Daily 8)  Claritin 10 Mg Tabs (Loratadine) 9)  12 Hour Nasal Spray 0.05 % Soln (Oxymetazoline Hcl) .... 2-3 Sprays in Each Nostril For 4 Days 10)  Altamist Spray 0.65 % Soln (Saline) .... 2-3 Sprays Three Times A Day, Start in 5 Days  Allergies: 1)  ! Sulfa 2)  ! * Avelox 3)  ! * Humibid 4)  ! Lisinopril (Lisinopril)  Social History: Reviewed history from 02/16/2009 and no changes required. Daughter [place] Not employed Limited exercise - use to walk 3 miles per day but cannot now because of pain pt smokes weed Tobacco Use - No.  Alcohol Use - yes Disabled   Physical Exam  General:  Vitals reviewed.  alert, well-hydrated, and overweight-appearing.   Eyes:  normal conjunctiva Ears:  R ear normal and L ear normal.   Nose:  no nasal pain on palpation.  no nasal discharge.  some nasal erythema Mouth:  good dentition and pharynx pink and moist.   Neck:  No  deformities, masses, or tenderness noted. Lungs:  normal respiratory effort.   Heart:  normal rate and regular rhythm.   Abdomen:  soft and non-tender.   Obese Extremities:  No lower extremity edema Skin:  two small cysts at the DIP joint of both middle fingers.  Appear pretty hard, not fluid filled. Psych:  Not anxious or depressed appearing   Impression & Recommendations:  Problem # 1:  HOT FLASHES (ICD-627.2) Assessment New Persists since 2007.  Will check hormone levels.  Pt would benefit from a low GI diet to help restore hormone balance.  Will work on treating symptoms if hormone levels are off. Orders: Miscellaneous Lab Charge-[facility] [phone]) TSH-[facility] [id number]) [facility]- Est  Level 4 (91478)  Problem # 2:  ANXIETY STATE NEC (ICD-300.09) Assessment: Unchanged well controlled with occassional Xanax. Her updated medication list for this problem  includes:    Xanax 1 Mg Tabs (Alprazolam) .Marland Kitchen... Three times a day as needed for anxiety  Problem # 3:  SINUSITIS, CHRONIC (ICD-473.9) Assessment: New  she has been using her Flonase.  Will add Afrin for the next 5 days and then go to a saline nasal spray. Her updated medication list for this problem includes:    Flonase 50 Mcg/act Susp (Fluticasone propionate) .Marland Kitchen... 1 squirt each nares two times a day as needed for allergies    12 Hour Nasal Spray 0.05 % Soln (Oxymetazoline hcl) .Marland Kitchen... 2-3 sprays in each nostril for 4 days    Altamist Spray 0.65 % Soln (Saline) .Marland Kitchen... 2-3 sprays three times a day, start in 5 days  Orders: [facility]- Est  Level 4 (29562)  Problem # 4:  GANGLION CYST (ICD-727.43) Assessment: New  Offered pt steroid injection.  She denied.  Will continue to monitor.  Orders: [facility]- Est  Level 4 (13086)  Problem # 5:  OBESITY (ICD-278.00) Assessment: Deteriorated  Advised pt to cut out any potatoes and bread and we will reevaluate at next visit.  Orders: [facility]- Est  Level 4 (99214)  Complete Medication List: 1)  Xanax 1 Mg Tabs (Alprazolam) .... Three times a day as needed for anxiety 2)  Flonase 50 Mcg/act Susp (Fluticasone propionate) .Marland Kitchen.. 1 squirt each nares two times a day as needed for allergies 3)  Hydrochlorothiazide 25 Mg Tabs (Hydrochlorothiazide) .... Take 1 tab by mouth in the morning 4)  Atenolol 50 Mg Tabs (Atenolol) .... Take one tab by mouth daily 5)  Flexeril 10 Mg Tabs (Cyclobenzaprine hcl) .... Take 1 tab by mouth three times a day as needed for muscle spasm 6)  Omeprazole 20 Mg Tbec (Omeprazole) .... Take 1-2 tabs by mouth daily 7)  Multivitamins Tabs (Multiple vitamin) .... Take 1 tab by mouth daily 8)  Claritin 10 Mg Tabs (Loratadine) 9)  12 Hour Nasal Spray 0.05 % Soln (Oxymetazoline hcl) .... 2-3 sprays in each nostril for 4 days 10)  Altamist Spray 0.65 % Soln (Saline) .... 2-3 sprays three times a day, start in 5 days  Patient Instructions: 1)  It  was good to see you again today 2)  I have prescribed Afrin for your sinuses.  Use this for the next 4 days and then start using a Saline nasal spray after that 3)  I will also check your hormone levels to see why you are still having the hot flashes 4)  I am not sure what is causing the bumps on your fingers, but I do not think that they are anything to worry about.  We will just  keep an eye on them for now.  If they get bigger or more bothersome we can send you to a dermatologist for evaluation 5)  Please schedule a follow up appointment in 4 weeks Prescriptions: ALTAMIST SPRAY 0.65 % SOLN (SALINE) 2-3 sprays three times a day, start in 5 days  #1 x 3   Entered and Authorized by:   Angelena Sole MD   Signed by:   Angelena Sole MD on 06/05/2010   Method used:   Print then Give to Patient   RxID:   [id number] 12 HOUR NASAL SPRAY 0.05 % SOLN (OXYMETAZOLINE HCL) 2-3 sprays in each nostril for 4 days  #1 x 0   Entered and Authorized by:   Angelena Sole MD   Signed by:   Angelena Sole MD on 06/05/2010   Method used:   Print then Give to Patient   RxID:   [id number] Prudy Feeler 1 MG  TABS (ALPRAZOLAM) three times a day as needed for anxiety  #30 x 0   Entered and Authorized by:   Angelena Sole MD   Signed by:   Angelena Sole MD on 06/05/2010   Method used:   Print then Give to Patient   RxID:   [id number]

## 2010-10-17 NOTE — Assessment & Plan Note (Signed)
Summary: np6/ PALP/PT HAS MEDICADE/G D   Primary Provider:  Angelena Sole  CC:  lightheaded.  History of Present Illness: 50 yo female with a past medical history of mitral valve relapse who last July with her palpitations. She did have her last echocardiogram performed in August of 2003. At that time her LV function was normal. There is mild left atrial enlargement. Mitral valve prolapse could not be excluded. There was mild mitral regurgitation. The patient states that over the past several months she has had intermittent palpitations. These are described as a "skip". They last for several minutes and resolve spontaneously. There is no associated chest pain, shortness of breath, presyncope or syncope. She does not have sustained palpitations. She also has noticed mild pedal edema but she denies any dyspnea on exertion, orthopnea, PND or exertional chest pain. Because of the above we were asked to further evaluate.  Current Medications (verified): 1)  Xanax 1 Mg  Tabs (Alprazolam) .... Three Times A Day As Needed For Anxiety 2)  Flonase 50 Mcg/act  Susp (Fluticasone Propionate) .Marland Kitchen.. 1 Squirt Each Nares Two Times A Day As Needed For Allergies 3)  Percocet 10-325 Mg Tabs (Oxycodone-Acetaminophen) .... Take 1 Tab By Mouth 2-3 Times/day 4)  Hydrochlorothiazide 25 Mg Tabs (Hydrochlorothiazide) .... Take 1 Tab By Mouth in The Morning 5)  Atenolol 50 Mg Tabs (Atenolol) .... Take 1 Tab By Mouth Every Day 6)  Flexeril 10 Mg Tabs (Cyclobenzaprine Hcl) .... Take 1 Tab By Mouth Three Times A Day As Needed For Muscle Spasm 7)  Omeprazole 20 Mg Tbec (Omeprazole) .... Take 1-2 Tabs By Mouth Daily  Allergies: 1)  ! Sulfa 2)  ! * Avelox 3)  ! * Humibid 4)  ! Lisinopril (Lisinopril)  Past History:  Past Medical History: Current Problems:  PALPITATIONS (ICD-785.1) HYPERTENSION, BENIGN (ICD-401.1) Mild hyperlipidemia ARTHRITIS (ICD-716.90) OBESITY (ICD-278.00) ACUTE BRONCHITIS (ICD-466.0) OTHER  SCREENING MAMMOGRAM (ICD-V76.12) SCREENING FOR DIABETES MELLITUS (ICD-V77.1) GERD (ICD-530.81) NECK PAIN, CHRONIC (ICD-723.1) LOW BACK PAIN, CHRONIC (ICD-724.2) ANXIETY STATE NEC (ICD-300.09) ALLERGIC RHINITIS, SEASONAL, MILD (ICD-477.9) mumur  Mitral valve prolapse  Past Surgical History: R knee arthroscopy C3-5 Fusion L5-S1 Laminectomy  tubal ligation   hysterectomy  Cholecystectomy Tonsillectomy  Family History: Reviewed history from 08/07/2008 and no changes required. Dad: Lung Cancer Mom: HTN, DMII, DJD  Social History: Reviewed history from 08/07/2008 and no changes required. Daughter [place] Not employed Limited exercise - use to walk 3 miles per day but cannot now because of pain pt smokes weed Tobacco Use - No.  Alcohol Use - yes Disabled   Review of Systems       She does have occasional back pain but no fevers or chills, productive cough, hemoptysis, dysphasia, odynophagia, melena, hematochezia, dysuria, hematuria, rash, seizure activity, orthopnea, PND, pedal edema, claudication. Remaining systems are negative.   Vital Signs:  Patient profile:   50 year old female Height:      65 inches Weight:      225 pounds BMI:     37.58 Pulse rate:   67 / minute BP sitting:   130 / 90  (left arm)  Vitals Entered By: Kem Parkinson (February 16, 2009 11:22 AM)  Physical Exam  General:  Well developed/well nourished in NAD Skin warm/dry, tattoo noted. Patient not depressed No peripheral clubbing Back-status post surgery HEENT-normal/normal eyelids Neck supple/normal carotid upstroke bilaterally; no bruits; no JVD; no thyromegaly chest - CTA/ normal expansion CV - RRR/normal S1 and S2; 1-2/6 systolic murmur at  the left sternal border. Mild increase with Valsalva. No rubs or gallops.  PMI nondisplaced Abdomen -NT/ND, no HSM, no mass, + bowel sounds, no bruit 2+ femoral pulses, no bruits Ext-no edema, chords, 2+ DP Neuro-grossly  nonfocal     EKG  Procedure date:  02/16/2009  Findings:      Normal sinus rhythm at a rate of 67. Axis is normal. Nonspecific T-wave changes.  Impression & Recommendations:  Problem # 1:  PALPITATIONS (ICD-785.1) The patient sounds to be having potentially premature atrial contractions or ventricular contractions. We will increase her atenolol to 75 mg p.o. daily. If her symptoms persist we will schedule a CardioNet monitor. Her updated medication list for this problem includes:    Atenolol 50 Mg Tabs (Atenolol) .Marland Kitchen... Take one and one half tablets by mouth daily  Problem # 2:  MITRAL VALVE PROLAPSE (ICD-424.0) Repeat echocardiogram. Her updated medication list for this problem includes:    Hydrochlorothiazide 25 Mg Tabs (Hydrochlorothiazide) .Marland Kitchen... Take 1 tab by mouth in the morning    Atenolol 50 Mg Tabs (Atenolol) .Marland Kitchen... Take one and one half tablets by mouth daily  Orders: Echocardiogram (Echo)  Problem # 3:  HYPERTENSION, BENIGN (ICD-401.1) Blood pressure mildly elevated. We are increasing the atenolol. Her updated medication list for this problem includes:    Hydrochlorothiazide 25 Mg Tabs (Hydrochlorothiazide) .Marland Kitchen... Take 1 tab by mouth in the morning    Atenolol 50 Mg Tabs (Atenolol) .Marland Kitchen... Take one and one half tablets by mouth daily  Orders: EKG w/ Interpretation (93000)  Problem # 4:  GERD (ICD-530.81)  Her updated medication list for this problem includes:    Omeprazole 20 Mg Tbec (Omeprazole) .Marland Kitchen... Take 1-2 tabs by mouth daily  Problem # 5:  ANXIETY STATE NEC (ICD-300.09) Management per primary care.  Patient Instructions: 1)  Your physician has requested that you have an echocardiogram.  Echocardiography is a painless test that uses sound waves to create images of your heart. It provides your doctor with information about the size and shape of your heart and how well your heart's chambers and valves are working.  This procedure takes approximately one hour.  There are no restrictions for this procedure. 2)  Your physician recommends that you schedule a follow-up appointment in: 6 weeks 3)  Your physician has recommended you make the following change in your medication: Increase Atenolol to 75 mg daily Prescriptions: ATENOLOL 50 MG TABS (ATENOLOL) Take one and one half tablets by mouth daily  #45 x 6   Entered by:   Dossie Arbour, RN, BSN   Authorized by:   Ferman Hamming, MD, [facility]   Signed by:   Dossie Arbour, RN, BSN on 02/16/2009   Method used:   Electronically to        [facility] [phone]* (retail)       [address]       [place], [place]  [address]       Ph: [phone]       Fax: [phone]   RxID:   [id number]

## 2010-10-17 NOTE — Assessment & Plan Note (Signed)
Summary: bpck,tcb   Vital Signs:  Patient profile:   50 year old female Height:      65.5 inches Weight:      220 pounds BMI:     36.18 BSA:     2.07 Temp:     98.2 degrees F Pulse rate:   96 / minute BP sitting:   130 / 83  Vitals Entered By: Jone Baseman CMA (January 08, 2009 10:07 AM) CC: bp meds refill, sinus congestion, anxiety med refill Is Patient Diabetic? No Pain Assessment Patient in pain? no       Last HDL:  45 (08/07/2008 8:27:00 PM) HDL Next Due:  1 yr Last LDL:  154 (08/07/2008 8:27:00 PM) LDL Next Due:  1 yr   History of Present Illness: HTN: doing well on meds.  no side effects.  no chest pain, no sob, no edema.  no palpitations.  upto date on kidney function screening.  Sinus congstion: started with pollen coming out.  does have a productive cough at times.  feels like she has congestion deep in her chest and in her sinuses.  not taking any meds to help.  denies fevers or sick contacts.  has been on zyrtec in the past but hasn't taken any since this started.  denies runny nose or tooth pain.   anxiety med: needs refill on xanax.  rarely uses - not even every week but is afraid to not have any on hand and has run out since last refill.  works well when needed.  would like refill today.  Current Medications (verified): 1)  Xanax 1 Mg  Tabs (Alprazolam) .... Three Times A Day As Needed For Anxiety 2)  Flonase 50 Mcg/act  Susp (Fluticasone Propionate) .Marland Kitchen.. 1 Squirt Each Nares Two Times A Day As Needed For Allergies 3)  Percocet 10-325 Mg Tabs (Oxycodone-Acetaminophen) .... Take 1 Tab By Mouth 2-3 Times/day 4)  Hydrochlorothiazide 25 Mg Tabs (Hydrochlorothiazide) .... Take 1 Tab By Mouth in The Morning 5)  Atenolol 50 Mg Tabs (Atenolol) .... Take 1 Tab By Mouth Every Day 6)  Flexeril 10 Mg Tabs (Cyclobenzaprine Hcl) .... Take 1 Tab By Mouth Three Times A Day As Needed For Muscle Spasm 7)  Omeprazole 20 Mg Tbec (Omeprazole) .... Take 1-2 Tabs By Mouth  Daily  Allergies (verified): 1)  ! Sulfa 2)  ! * Avelox 3)  ! * Humibid 4)  ! Lisinopril (Lisinopril)  Past History:  Past medical, surgical, family and social histories (including risk factors) reviewed, and no changes noted (except as noted below).  Past Medical History:    Reviewed history from 08/07/2008 and no changes required:    HTN    Degenerative Disc Disease    Mitral Valve Prolapse    Chronic neck, low back, right knee pain    GERD  Past Surgical History:    Reviewed history from 08/07/2008 and no changes required:    R knee arthroscopy    C3-5 Fusion    L5-S1 Laminectomy  Family History:    Reviewed history from 08/07/2008 and no changes required:       Dad: Lung Cancer       Mom: HTN, DMII, DJD  Social History:    Reviewed history from 08/07/2008 and no changes required:       Daughter [place]       Not employed       Limited exercise - use to walk 3 miles per day but cannot now because of  pain       Does not Smoke  Review of Systems       per HPI  Physical Exam  General:  alert and overweight-appearing.   Head:  Normocephalic and atraumatic without obvious abnormalities. No apparent alopecia or balding. Eyes:  No corneal or conjunctival inflammation noted. EOMI. Perrla. Vision grossly normal. Ears:  no external deformities.   Nose:  External nasal examination shows no deformity or inflammation. Nasal mucosa are pink and moist without lesions or exudates. Mouth:  Oral mucosa and oropharynx without lesions or exudates.  Teeth in good repair. Neck:  No deformities, masses, or tenderness noted. Lungs:  Normal respiratory effort, chest expands symmetrically. Lungs are clear to auscultation, no crackles or wheezes. Heart:  RRR.  2/6 Systolic murmur heard throughout precordium with midsystolic click. Extremities:  no edema   Impression & Recommendations:  Problem # 1:  HYPERTENSION, BENIGN (ICD-401.1) Assessment Unchanged  refilled meds.  doing well.  f/u in 3 months with dr Lelon Perla.  Her updated medication list for this problem includes:    Hydrochlorothiazide 25 Mg Tabs (Hydrochlorothiazide) .Marland Kitchen... Take 1 tab by mouth in the morning    Atenolol 50 Mg Tabs (Atenolol) .Marland Kitchen... Take 1 tab by mouth every day  Orders: [facility]- Est  Level 4 ([address]109)  Problem # 2:  ALLERGIC RHINITIS, SEASONAL, MILD (ICD-477.9) Assessment: Deteriorated  i think this is the cause for her congestion/sinus problems.  advised zyrtec otc and mucinex for 2 weeks.    Her updated medication list for this problem includes:    Flonase 50 Mcg/act Susp (Fluticasone propionate) .Marland Kitchen... 1 squirt each nares two times a day as needed for allergies  Orders: [facility]- Est  Level 4 (60454)  Problem # 3:  ANXIETY STATE NEC (ICD-300.09) Assessment: Unchanged  refilled xanax for #30.   Her updated medication list for this problem includes:    Xanax 1 Mg Tabs (Alprazolam) .Marland Kitchen... Three times a day as needed for anxiety  Orders: [facility]- Est  Level 4 (09811)  Complete Medication List: 1)  Xanax 1 Mg Tabs (Alprazolam) .... Three times a day as needed for anxiety 2)  Flonase 50 Mcg/act Susp (Fluticasone propionate) .Marland Kitchen.. 1 squirt each nares two times a day as needed for allergies 3)  Percocet 10-325 Mg Tabs (Oxycodone-acetaminophen) .... Take 1 tab by mouth 2-3 times/day 4)  Hydrochlorothiazide 25 Mg Tabs (Hydrochlorothiazide) .... Take 1 tab by mouth in the morning 5)  Atenolol 50 Mg Tabs (Atenolol) .... Take 1 tab by mouth every day 6)  Flexeril 10 Mg Tabs (Cyclobenzaprine hcl) .... Take 1 tab by mouth three times a day as needed for muscle spasm 7)  Omeprazole 20 Mg Tbec (Omeprazole) .... Take 1-2 tabs by mouth daily  Patient Instructions: 1)  Please follow up in 3 months with Dr Lelon Perla to see how things are going with your blood pressure. 2)  for your sinus and cough, start taking zyrtec (generic is fine) daily for the next 2 weeks and mucinex (generic is also fine) as directed on the  bottle for the next 2 weeks.  Then you can just take it as needed.  Be sure to drink plenty of water with the mucinex - this is how it works best. 3)  I have sent your refills to [facility] on [address]   4)  It was great to see you today! Prescriptions: XANAX 1 MG  TABS (ALPRAZOLAM) three times a day as needed for anxiety  #30 x 0  Entered and Authorized by:   Ancil Boozer  MD   Signed by:   Ancil Boozer  MD on 01/08/2009   Method used:   Print then Give to Patient   RxID:   [id number] ATENOLOL 50 MG TABS (ATENOLOL) take 1 tab by mouth every day  #30 x 3   Entered and Authorized by:   Ancil Boozer  MD   Signed by:   Ancil Boozer  MD on 01/08/2009   Method used:   Electronically to        [facility] [phone]* (retail)       [address]       [place], [place]  [address]       Ph: [phone]       Fax: [phone]   RxID:   [id number] HYDROCHLOROTHIAZIDE 25 MG TABS (HYDROCHLOROTHIAZIDE) take 1 tab by mouth in the morning  #30 x 3   Entered and Authorized by:   Ancil Boozer  MD   Signed by:   Ancil Boozer  MD on 01/08/2009   Method used:   Electronically to        [facility] [phone]* (retail)       [address]       [place], [place]  [address]       Ph: [phone]       Fax: [phone]   RxID:   [id number]

## 2010-10-17 NOTE — Miscellaneous (Signed)
  Clinical Lists Changes  Problems: Removed problem of COUGH (ICD-786.2) Removed problem of SHOULDER PAIN, RIGHT (ICD-719.41) Removed problem of SINUSITIS, CHRONIC (ICD-473.9) Removed problem of NECK PAIN, CHRONIC (ICD-723.1) Removed problem of LOW BACK PAIN, CHRONIC (ICD-724.2)

## 2010-10-17 NOTE — Progress Notes (Signed)
Summary: Mariah [name]  Phone Note Call from Patient   Caller: Patient Summary of Call: pt had appt this AM and had to Mariah [name] due to conflict. Initial call taken by: De Nurse,  March 16, 2009 9:54 AM

## 2010-10-17 NOTE — Assessment & Plan Note (Signed)
Summary: f/u,df   Vital Signs:  Patient profile:   50 year old female Height:      65.5 inches Weight:      222.1 pounds BMI:     36.53 Temp:     98.2 degrees F oral Pulse rate:   60 / minute BP sitting:   136 / 84  (left arm) Cuff size:   large  Vitals Entered By: Garen Grams LPN (February 13, 4258 2:31 PM) CC: sinus and chest congestion   Primary Care Mariah [name]:  Angelena Sole  CC:  sinus and chest congestion.  History of Present Illness: 1. Palpitations: Pt. has been having intermittent palpitations that she noticed last week.  They were there for 4 days and happened every day.  It lasted for 2 minutes.  Felt like her heart was racing.  Denies any chest pain, shortness of breath, light-headedness, or dizziness.  Denies weight loss.  Pt. does have a hx. or HTN and atypical chest pain (negative CEs)  2. Sinus and chest congestion: Pt. got out of the shower last week and got chilled by having wet hair.  Since then she has had sinus and chest congestion.  She has also had a dry cough and coughing spells.  She denies any fevers, shortness of breath, pleuritic chest pain, fatigue.  Has tried Mucinex and Halls which do not seem to help.  Denies sore throat, rhinorrhea, nausea, vomitting.  3. Obesity: Has been gaining weight.  Plans to increase her physical activity now that [place] is able to exercise as well.  Plans to start walking.  Also is going to cut out all carbs.  Habits & Providers  Alcohol-Tobacco-Diet     Tobacco Status: never  Current Medications (verified): 1)  Xanax 1 Mg  Tabs (Alprazolam) .... Three Times A Day As Needed For Anxiety 2)  Flonase 50 Mcg/act  Susp (Fluticasone Propionate) .Marland Kitchen.. 1 Squirt Each Nares Two Times A Day As Needed For Allergies 3)  Percocet 10-325 Mg Tabs (Oxycodone-Acetaminophen) .... Take 1 Tab By Mouth 2-3 Times/day 4)  Hydrochlorothiazide 25 Mg Tabs (Hydrochlorothiazide) .... Take 1 Tab By Mouth in The Morning 5)  Atenolol 50 Mg Tabs (Atenolol)  .... Take 1 Tab By Mouth Every Day 6)  Flexeril 10 Mg Tabs (Cyclobenzaprine Hcl) .... Take 1 Tab By Mouth Three Times A Day As Needed For Muscle Spasm 7)  Omeprazole 20 Mg Tbec (Omeprazole) .... Take 1-2 Tabs By Mouth Daily  Allergies: 1)  ! Sulfa 2)  ! * Avelox 3)  ! * Humibid 4)  ! Lisinopril (Lisinopril)  Past History:  Family History: Last updated: 08/07/2008 Dad: Lung Cancer Mom: HTN, DMII, DJD  Social History: Smoking Status:  never  Physical Exam  General:  alert and overweight-appearing.   Head:  normocephalic and atraumatic.   Eyes:  vision grossly intact, pupils equal, and pupils round.   Nose:  no external deformity and no nasal discharge.   Mouth:  pharynx pink and moist.   Lungs:  normal respiratory effort, normal breath sounds, no dullness, and no crackles.   Heart:  normal rate and regular rhythm.   Abdomen:  obese Extremities:  No edema Psych:  not depressed appearing.     Impression & Recommendations:  Problem # 1:  PALPITATIONS (ICD-785.1) Assessment New Denies current palpitations so will not put on Holter monitor or get EKG.  Hx. of atypical chest pain.  Will refer to Cardiology Her updated medication list for this problem includes:  Atenolol 50 Mg Tabs (Atenolol) .Marland Kitchen... Take 1 tab by mouth every day  Orders: Cardiology Referral (Cardiology) [facility]- Est  Level 4 ([address]782)  Problem # 2:  ACUTE BRONCHITIS (ICD-466.0) Assessment: New  Should resolve on its own.  Pt. told to RTC if she develops fevers, chest pain, or shortness of breath.  Orders: [facility]- Est  Level 4 (95621)  Problem # 3:  OBESITY (ICD-278.00) Assessment: New  Spent 15 minutes counseling pt. on diet and exercise.  Will recheck in 4 weeks.  Orders: [facility]- Est  Level 4 ([address]214)  Complete Medication List: 1)  Xanax 1 Mg Tabs (Alprazolam) .... Three times a day as needed for anxiety 2)  Flonase 50 Mcg/act Susp (Fluticasone propionate) .Marland Kitchen.. 1 squirt each nares two times a day as needed  for allergies 3)  Percocet 10-325 Mg Tabs (Oxycodone-acetaminophen) .... Take 1 tab by mouth 2-3 times/day 4)  Hydrochlorothiazide 25 Mg Tabs (Hydrochlorothiazide) .... Take 1 tab by mouth in the morning 5)  Atenolol 50 Mg Tabs (Atenolol) .... Take 1 tab by mouth every day 6)  Flexeril 10 Mg Tabs (Cyclobenzaprine hcl) .... Take 1 tab by mouth three times a day as needed for muscle spasm 7)  Omeprazole 20 Mg Tbec (Omeprazole) .... Take 1-2 tabs by mouth daily  Patient Instructions: 1)  Please schedule a follow up appt. in 4 weeks 2)  See the Cardiologist for your palpitations 3)  The chest congestions should get better in the next couple of weeks. 4)  Try limiting the amount of carbs you eat and switch to good carbs like brown rice, sweet potatoes, and oat meal.

## 2010-10-17 NOTE — Letter (Signed)
Summary: Lipid Letter  [name]  [address]   [place], [place] [address]   Phone: [phone]  Fax: [phone]    08/14/2008  Mariah [name] [address] [place], [place]  [address]  Dear Mariah [name]:  We have carefully reviewed your last lipid profile from  and the results are noted below with a summary of recommendations for lipid management.    Cholesterol:   245         Goal: < 200   HDL "good" Cholesterol: 46       Goal: > 40   LDL "bad" Cholesterol: 154       Goal: < 130   Triglycerides:   232         Goal: < 150        TLC Diet (Therapeutic Lifestyle Change): Saturated Fats & Transfatty acids should be kept < 7% of total calories ***Reduce Saturated Fats Polyunstaurated Fat can be up to 10% of total calories Monounsaturated Fat Fat can be up to 20% of total calories Total Fat should be no greater than 25-35% of total calories Carbohydrates should be 50-60% of total calories Protein should be approximately 15% of total calories Fiber should be at least 20-30 grams a day ***Increased fiber may help lower LDL Total Cholesterol should be < 200mg /day Consider adding plant stanol/sterols to diet (example: Benacol spread) ***A higher intake of unsaturated fat may reduce Triglycerides and Increase HDL    Adjunctive Measures (may lower LIPIDS and reduce risk of Heart Attack) include: Aerobic Exercise (20-30 minutes 3-4 times a week) Limit Alcohol Consumption Weight Reduction Aspirin 75-81 mg a day by mouth (if not allergic or contraindicated) Vitamin E 400 IU a day by mouth Folic Acid 1mg  a day by mouth Dietary Fiber 20-30 grams a day by mouth     Current Medications: 1)    Nexium 40 Mg  Cpdr (Esomeprazole magnesium) .Marland Kitchen.. 1 by mouth once daily 2)    Xanax 1 Mg  Tabs (Alprazolam) .... Three times a day as needed for anxiety 3)    Flonase 50 Mcg/act  Susp (Fluticasone propionate) .Marland Kitchen.. 1 squirt each nares two times a day as needed for allergies 4)     Percocet 10-325 Mg Tabs (Oxycodone-acetaminophen) .... Take 1 tab by mouth 2-3 times/day 5)    Hydrochlorothiazide 25 Mg Tabs (Hydrochlorothiazide) .... Take 1 tab by mouth in the morning 6)    Atenolol 50 Mg Tabs (Atenolol) .... Take 1 tab by mouth every day 7)    Flexeril 10 Mg Tabs (Cyclobenzaprine hcl) .... Take 1 tab by mouth three times a day as needed for muscle spasm  If you have any questions, please call. We appreciate being able to work with you.   Sincerely,   Angelena Sole, MD  [name]   Appended Document: Lipid Letter mailed.

## 2010-10-17 NOTE — Miscellaneous (Signed)
Summary: [facility]  Records from previous office are in your box.  Please mark the pages you would like scanned, then return to front office Admin Team.

## 2010-10-17 NOTE — Letter (Signed)
Summary: [facility] Records  [facility]   Imported By: Bradly Bienenstock 09/13/2008 16:12:49  _____________________________________________________________________  External Attachment:    Type:   Image     Comment:   External Document

## 2010-10-17 NOTE — Progress Notes (Signed)
Summary: Refill  Phone Note Refill Request Call back at [phone]   Refills Requested: Medication #1:  HYDROCHLOROTHIAZIDE 25 MG TABS take 1 tab by mouth in the morning Pt's pharmacy sent a fax to request refill for med.  Pt also want to have a longer supply prescribed for the Atenolol and HCTZ.    Initial call taken by: Abundio Miu,  July 26, 2010 2:49 PM    Prescriptions: ATENOLOL 50 MG TABS (ATENOLOL) Take one tab by mouth daily  #90 x 3   Entered and Authorized by:   Angelena Sole MD   Signed by:   Angelena Sole MD on 07/26/2010   Method used:   Electronically to        [facility] [phone]* (retail)       [address]       [place], [place]  [address]       Ph: [phone]       Fax: [phone]   RxID:   [id number] HYDROCHLOROTHIAZIDE 25 MG TABS (HYDROCHLOROTHIAZIDE) take 1 tab by mouth in the morning  #90 x 3   Entered and Authorized by:   Angelena Sole MD   Signed by:   Angelena Sole MD on 07/26/2010   Method used:   Electronically to        [facility] [phone]* (retail)       [address]       [place], [place]  [address]       Ph: [phone]       Fax: [phone]   RxID:   [id number]

## 2010-10-17 NOTE — Progress Notes (Signed)
   Phone Note Outgoing Call Call back at [facility] Phone [phone]   Call placed by: Angelena Sole MD,  August 14, 2008 12:26 PM Reason for Call: Discuss lab or test results Summary of Call: Discussed cholesterol lab results and that her total cholesterol and LDL were too high.  I recommended starting a medication but she wanted to try diet before starting another medication.  I told her that I would send her some information about the right things to eat and that we would recheck her lipids in 6 weeks. She also said that she thinks that she may be getting a UTI so I told her to call and schedule a work-in appt.

## 2010-10-17 NOTE — Assessment & Plan Note (Signed)
Summary: f/up,tcb   Vital Signs:  Patient profile:   50 year old female Height:      65 inches Weight:      225.1 pounds BMI:     37.59 Temp:     97.8 degrees F oral Pulse rate:   62 / minute BP sitting:   138 / 90  (left arm) Cuff size:   large  Vitals Entered By: Garen Grams LPN (March 23, 1609 9:14 AM) CC: Palpitations, Obesity Pain Assessment Patient in pain? no        Primary Care Provider:  Angelena Sole  CC:  Palpitations and Obesity.  History of Present Illness: 1. Palpitations:  Pt. here for follow up on palpitations.  Was having them more frequently than normal at last visit.  Sent to Cardiology for evaluation.  Echo was done and was normal.  Was not put on a monitor.  They increased her Atenolol to 75 mg daily but she didn't take the increased dose because she was afraid that her prescription would run out and Medicare wouldn't cover a refill.  She hasn't had any palpitations since her visit to the Cardiologist.  Denies chest pain, DOE, weight loss.  2. Obesity:  She is trying the diet recommendations that we discussed last time.  Her weight continues to go up.  She is eating a lot of fruit.  Has switched to more complex carbs.  Eating carbs in the evening.  Has not started exercising regularly yet.  3. Sinus congestion:  Improved.  Not having as much cough and congestion as she was.  Denies fevers, productive cough, shortness of breath, wheezing.   Physical Exam: Vital signs reviewed Gen: alert in no acute distress, obese CV: regular rate and rhythm without murmurs Resp: clear to auscultation bilaterally, normal work of breathing Abd: soft, non-tender, non-distended Ext: no lower extremity edema Psych: not depressed appearing   Habits & Providers  Alcohol-Tobacco-Diet     Tobacco Status: never  Allergies: 1)  ! Sulfa 2)  ! * Avelox 3)  ! * Humibid 4)  ! Lisinopril (Lisinopril)   Impression & Recommendations:  Problem # 1:  PALPITATIONS  (ICD-785.1) Assessment Improved  Pt. only taking the 50 mg of Atenolol daily eventhough Cards recommended increasing to 75mg .  Denies any palpitations for the past couple of weeks.  Will increase to 75mg  if palpitations recur Her updated medication list for this problem includes:    Atenolol 50 Mg Tabs (Atenolol) .Marland Kitchen... Take one tab by mouth daily  Orders: [facility]- Est  Level 4 ([address])  Problem # 2:  ACUTE BRONCHITIS (ICD-466.0) Assessment: Improved  Orders: [facility]- Est  Level 4 ([address])  Problem # 3:  OBESITY (ICD-278.00) Assessment: Deteriorated  Encouraged pt. to decrease the amount of fruit because of the sugar, to start taking a multivitamin, and to cut out carbs in the evening.  Orders: [facility]- Est  Level 4 ([address])  Medications Added to Medication List This Visit: 1)  Atenolol 50 Mg Tabs (Atenolol) .... Take one tab by mouth daily 2)  Multivitamins Tabs (Multiple vitamin) .... Take 1 tab by mouth daily  Complete Medication List: 1)  Xanax 1 Mg Tabs (Alprazolam) .... Three times a day as needed for anxiety 2)  Flonase 50 Mcg/act Susp (Fluticasone propionate) .Marland Kitchen.. 1 squirt each nares two times a day as needed for allergies 3)  Percocet 10-325 Mg Tabs (Oxycodone-acetaminophen) .... Take 1 tab by mouth 2-3 times/day 4)  Hydrochlorothiazide 25 Mg Tabs (Hydrochlorothiazide) .Marland KitchenMarland KitchenMarland Kitchen  Take 1 tab by mouth in the morning 5)  Atenolol 50 Mg Tabs (Atenolol) .... Take one tab by mouth daily 6)  Flexeril 10 Mg Tabs (Cyclobenzaprine hcl) .... Take 1 tab by mouth three times a day as needed for muscle spasm 7)  Omeprazole 20 Mg Tbec (Omeprazole) .... Take 1-2 tabs by mouth daily 8)  Multivitamins Tabs (Multiple vitamin) .... Take 1 tab by mouth daily

## 2010-10-17 NOTE — Assessment & Plan Note (Signed)
Summary: new pt/daughter sees Dr. Oneta Rack refill/[facility]   Vital Signs:  Patient Profile:   50 Years Old Female Height:     66 inches Weight:      212 pounds Temp:     98.2 degrees F Pulse rate:   66 / minute BP sitting:   135 / 72  Vitals Entered By: Jone Baseman CMA (August 07, 2008 9:17 AM)                 PCP:  Angelena Sole  Chief Complaint:  New patient visit for blood pressure.  History of Present Illness: Pt is here to discuss a preventive care and current problems  1. Preventive care:  Pt. wants to discuss mammogram, cervical cancer screening, weight loss and exercise.  Pt. will be given information for mammogram.  Pt. had a total hysterectomy in '07 so she does not need a pap smear.  Pt. has not been able to exercise because of leg and back pain.  She used to walk 3 miles a day.  Told her to start walking as much as she can tolerate (she thinks about 1.5 miles).  She is also in the process of starting water aerobics ([facility] is processing the Rx.)  2. Blood pressure:  Pt. has been taking her blood pressure medications as prescribed from [facility] without any problems.  She doesn't check her blood pressure regularly.  Today it was 135/72.  She denies any chest pain, headache, edema, shortness of breath.  3. GERD: Pt. has had GERD controlled with Nexium.  She has been taking and tolerating her medications without problems.  She denies any current abdominal pain or acid taste in her mouth.  4. Chronic neck, low back, and knee pain:  She is followed by [facility] for these problems.  Her medications include Percocet and Flexeril that are prescribed there.  Her pain is constant.  Her prior operations were reviewed.  She has had joint injections in both knees.  Will continue to have her follow up there.    Current Allergies: ! SULFA ! * AVELOX ! * HUMIBID ! LISINOPRIL (LISINOPRIL)  Past Medical History:    HTN    Degenerative Disc Disease  Mitral Valve Prolapse    Chronic neck, low back, right knee pain    GERD  Past Surgical History:    R knee arthroscopy    C3-5 Fusion    L5-S1 Laminectomy   Family History:    Dad: Lung Cancer    Mom: HTN, DMII, DJD  Social History:    Daughter [place]    Not employed    Limited exercise - use to walk 3 miles per day but cannot now because of pain    Does not Smoke   Risk Factors:  Tobacco use:  quit    Year quit:  1997    Pack-years:  20  PAP Smear History:     Date of Last PAP Smear:  08/07/2008  PAP Smear History:     Date of Last PAP Smear:  08/07/2008    Results:  hx. of hysterectomy    Physical Exam  General:     alert and overweight-appearing.   Eyes:     vision grossly intact, pupils equal, pupils round, and pupils reactive to light.   Nose:     no external deformity and no nasal discharge.   Neck:     No deformities, masses, or tenderness noted. Chest Wall:     No deformities,  masses, or tenderness noted. Lungs:     Normal respiratory effort, chest expands symmetrically. Lungs are clear to auscultation, no crackles or wheezes. Heart:     Normal rate and regular rhythm. S1 and S2 normal without gallop, murmur, click, rub or other extra sounds. Abdomen:     Obese, soft, non-tender, + bowel sounds Msk:     Tender to palpation over cervical and lumbar spine.  R knee and L knee with full ROM, no crepitus Pulses:     Dorsalis pedis equal bilaterally Skin:     Pt. has areas of Acanthosis Nigracans under her arms, on her abdomen, and back.    Impression & Recommendations:  Problem # 1:  HYPERTENSION, BENIGN (ICD-401.1) Assessment: Unchanged Will continue current medications.  Will check CMET and Lipid panel. Her updated medication list for this problem includes:    Hydrochlorothiazide 25 Mg Tabs (Hydrochlorothiazide) .Marland Kitchen... Take 1 tab by mouth in the morning    Atenolol 50 Mg Tabs (Atenolol) .Marland Kitchen... Take 1 tab by mouth every day  Orders: Comp  Met-[facility] [phone]) Lipid-[facility] ([address]1478)   Problem # 2:  SCREENING FOR DIABETES MELLITUS (ICD-V77.1) Assessment: New Pt. has a family history of DMII as well as areas of acanthosis nigracans concerning for DMII or prediabetes. Orders: A1C-[facility] ([address])   Problem # 3:  GERD (ICD-530.81) Assessment: Unchanged Continue Current treatment Her updated medication list for this problem includes:    Nexium 40 Mg Cpdr (Esomeprazole magnesium) .Marland Kitchen... 1 by mouth once daily   Problem # 4:  Screening Breast Cancer (ICD-V76.10) Assessment: New Will give pt. information to schedule Mammogram  Problem # 5:  LOW BACK PAIN, CHRONIC (ICD-724.2) Assessment: Unchanged Will continue current treatment per Ortho The following medications were removed from the medication list:    Vicodin Es 7.5-750 Mg Tabs (Hydrocodone-acetaminophen) .Marland Kitchen... 1 by mouth three times a day for pain  Her updated medication list for this problem includes:    Percocet 10-325 Mg Tabs (Oxycodone-acetaminophen) .Marland Kitchen... Take 1 tab by mouth 2-3 times/day    Flexeril 10 Mg Tabs (Cyclobenzaprine hcl) .Marland Kitchen... Take 1 tab by mouth three times a day as needed for muscle spasm   Problem # 6:  Preventive Health Care (ICD-V70.0) Assessment: Comment Only Pt. will get mammogram, no need for pap smear given total hysterectomy, not yet at age for colonoscopy.  Will check CMET, Lipids, HgA1C.  Continue to encourage exercise and proper diet.  Pt. should continue to walk as tolerated and start water aerobics when Rx. is processed.  Complete Medication List: 1)  Nexium 40 Mg Cpdr (Esomeprazole magnesium) .Marland Kitchen.. 1 by mouth once daily 2)  Xanax 1 Mg Tabs (Alprazolam) .... Three times a day as needed for anxiety 3)  Flonase 50 Mcg/act Susp (Fluticasone propionate) .Marland Kitchen.. 1 squirt each nares two times a day as needed for allergies 4)  Percocet 10-325 Mg Tabs (Oxycodone-acetaminophen) .... Take 1 tab by mouth 2-3 times/day 5)  Hydrochlorothiazide 25 Mg Tabs  (Hydrochlorothiazide) .... Take 1 tab by mouth in the morning 6)  Atenolol 50 Mg Tabs (Atenolol) .... Take 1 tab by mouth every day 7)  Flexeril 10 Mg Tabs (Cyclobenzaprine hcl) .... Take 1 tab by mouth three times a day as needed for muscle spasm   Patient Instructions: 1)  Please schedule a follow-up appointment in 6 months. 2)  Limit your Sodium (Salt). 3)  Check your Blood Pressure regularly. If it is above 150: you should make an appointment. 4)  It is important that  you exercise regularly at least 20 minutes 5 times a week. If you develop chest pain, have severe difficulty breathing, or feel very tired , stop exercising immediately and seek medical attention. 5)  Schedule your mammogram.   Prescriptions: ATENOLOL 50 MG TABS (ATENOLOL) take 1 tab by mouth every day  #30 x 2   Entered and Authorized by:   Angelena Sole MD   Signed by:   Angelena Sole MD on 08/07/2008   Method used:   Electronically to        [facility]* (retail)       [address]       [place], [place]  [address]       Ph: [phone]       Fax: [phone]   RxID:   [id number] HYDROCHLOROTHIAZIDE 25 MG TABS (HYDROCHLOROTHIAZIDE) take 1 tab by mouth in the morning  #30 x 2   Entered and Authorized by:   Angelena Sole MD   Signed by:   Angelena Sole MD on 08/07/2008   Method used:   Electronically to        [facility]* (retail)       [address]       [place], [place]  [address]       Ph: [phone]       Fax: [phone]   RxID:   [id number] NEXIUM 40 MG  CPDR (ESOMEPRAZOLE MAGNESIUM) 1 by mouth once daily  #30 x 2   Entered and Authorized by:   Angelena Sole MD   Signed by:   Angelena Sole MD on 08/07/2008   Method used:   Electronically to        [facility]* (retail)       [address]       [place], [place]  [address]       Ph: [phone]       Fax: [phone]   RxID:   [id number]  ] PAP Result Date:  08/07/2008 PAP Result:  hx. of  hysterectomy PAP Next Due:  Not Indicated

## 2010-10-17 NOTE — Progress Notes (Signed)
Summary: mammogram  Phone Note Call from Patient Call back at [facility] Phone [phone]   Summary of Call: Pt tried scheduling a mammogram and states they told her we would have to make it for her. Initial call taken by: Haydee Salter,  October 03, 2008 10:13 AM  Follow-up for Phone Call        LMOVM of pt to have her call back. Where did she call to get mammogram so I can call and clarify. Follow-up by: [facility] CMA,  October 03, 2008 11:15 AM  Additional Follow-up for Phone Call Additional follow up Details #1::        [facility] Additional Follow-up by: Haydee Salter,  October 03, 2008 11:18 AM  New Problems: OTHER SCREENING MAMMOGRAM (ICD-V76.12)   Additional Follow-up for Phone Call Additional follow up Details #2::    appt made.  See order Follow-up by: JESSICA [name] CMA,  October 03, 2008 11:46 AM  New Problems: OTHER SCREENING MAMMOGRAM (ICD-V76.12)

## 2010-10-17 NOTE — Progress Notes (Signed)
Summary: refill  Phone Note Refill Request Message from:  Patient  Refills Requested: Medication #1:  HYDROCHLOROTHIAZIDE 25 MG TABS take 1 tab by mouth in the morning pt states that pharmacy has faxed 2 times  Initial call taken by: De Nurse,  July 06, 2009 8:50 AM  Follow-up for Phone Call        filled. unable to reach pt to tell her it has been done Follow-up by: Golden Circle RN,  July 06, 2009 8:56 AM    Prescriptions: HYDROCHLOROTHIAZIDE 25 MG TABS (HYDROCHLOROTHIAZIDE) take 1 tab by mouth in the morning  #30 x 1   Entered by:   Golden Circle RN   Authorized by:   Angelena Sole MD   Signed by:   Golden Circle RN on 07/06/2009   Method used:   Electronically to        [facility] [phone]* (retail)       [address]       [place], [place]  [address]       Ph: [phone]       Fax: [phone]   RxID:   [id number]

## 2010-10-17 NOTE — Progress Notes (Signed)
  Phone Note Outgoing Call   Summary of Call: LM for patient about lab results.  Requested for her to call and schedule an appointment Initial call taken by: Angelena Sole MD,  June 13, 2010 9:50 AM     Appended Document:  pt has an 10/19.  does she need to come in sooner?  I told her I would call her back when I hear from you.  Appended Document:  No that appointment is fine

## 2010-12-26 ENCOUNTER — Telehealth: Payer: Self-pay | Admitting: Family Medicine

## 2010-12-26 NOTE — Telephone Encounter (Signed)
Pt need  something called to pharmacy for sinus congestion.  Have a rx on file for Flonase, but is concerned that she may have developed an immunity to meds.

## 2010-12-27 NOTE — Telephone Encounter (Signed)
She can take OTC meds.  If she wants something called in, she will need an appt.

## 2011-01-01 ENCOUNTER — Ambulatory Visit: Payer: Self-pay | Admitting: Family Medicine

## 2011-01-03 ENCOUNTER — Encounter: Payer: Self-pay | Admitting: Family Medicine

## 2011-01-03 ENCOUNTER — Ambulatory Visit (INDEPENDENT_AMBULATORY_CARE_PROVIDER_SITE_OTHER): Payer: PRIVATE HEALTH INSURANCE | Admitting: Family Medicine

## 2011-01-03 DIAGNOSIS — M542 Cervicalgia: Secondary | ICD-10-CM | POA: Insufficient documentation

## 2011-01-03 DIAGNOSIS — J309 Allergic rhinitis, unspecified: Secondary | ICD-10-CM

## 2011-01-03 DIAGNOSIS — M545 Low back pain, unspecified: Secondary | ICD-10-CM

## 2011-01-03 DIAGNOSIS — J329 Chronic sinusitis, unspecified: Secondary | ICD-10-CM

## 2011-01-03 MED ORDER — FLUTICASONE PROPIONATE 50 MCG/ACT NA SUSP: 1.0000 | Freq: Every day | NASAL | Status: DC

## 2011-01-03 MED ORDER — AZITHROMYCIN 250 MG PO TABS: ORAL_TABLET | ORAL | Status: DC

## 2011-01-03 MED ORDER — CYCLOBENZAPRINE HCL 10 MG PO TABS: 10.0000 mg | ORAL_TABLET | Freq: Three times a day (TID) | ORAL | Status: DC | PRN

## 2011-01-03 MED ORDER — CETIRIZINE HCL 10 MG PO TABS: 10.0000 mg | ORAL_TABLET | Freq: Every day | ORAL | Status: DC

## 2011-01-03 NOTE — Assessment & Plan Note (Signed)
Continue Zyrtec and Flonase.

## 2011-01-03 NOTE — Patient Instructions (Signed)
We will get x-rays of your back and neck I have sent in a refill for Flexeril I have also sent in a prescription for Azithromycin for sinusitis and Flonase for allergies. Take Zyrtec and Saline mist everyday Please schedule a follow up in 1 month to check on your back and neck

## 2011-01-03 NOTE — Assessment & Plan Note (Signed)
Pt with a hx of sinusitis.  She feels like she has another infection.  Will treat with Azithromycin.
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
## 2011-01-31 NOTE — Group Therapy Note (Signed)
NAME:  TIARI, ANDRINGA NO.:  [id number]   MEDICAL RECORD NO.:  [id number]          PATIENT TYPE:  WOC   LOCATION:  [facility]                   FACILITY:  [facility]   PHYSICIAN:  Argentina Donovan, MD        DATE OF BIRTH:  1961/02/05   DATE OF SERVICE:  11/18/2006                                  CLINIC NOTE   The patient is a 50 year old African-American female who underwent a  vaginal hysterectomy for adenomyosis 2 weeks ago.  Has an unremarkable  postoperative course with exception that she has had right frontal and  temporal headaches continually since the surgery.  She wakes up with it,  goes to bed with it and ibuprofen does not seem to help. I am going to  empirically treat her as a sinus headache and then if that does not to  help we are going to give her a Medrol pack, ampicillin, Afrin spray.  We are going to see back in 2 weeks for her routine follow-up and pelvic  exam and be able to tell whether or not she is any better at that time.   IMPRESSION:  Satisfactory progress post hysterectomy and possible  sinusitis.           ______________________________  Argentina Donovan, MD     PR/MEDQ  D:  11/18/2006  T:  11/18/2006  Job:  [id number]

## 2011-01-31 NOTE — H&P (Signed)
Mariah [name], Mariah [name]             ACCOUNT NO.:  [id number]   MEDICAL RECORD NO.:  [id number]           PATIENT TYPE:  AMB   LOCATION:                                [facility]   PHYSICIAN:  Phil D. Okey Dupre, M.D.     DATE OF BIRTH:  06-09-1961   DATE OF ADMISSION:  10/29/2006  DATE OF DISCHARGE:                              HISTORY & PHYSICAL   CHIEF COMPLAINT:  Severe pain with periods, continual low abdominal  pain, depression illness. The patient is a 50 year old African-American  gravida 5 para 2-0-3-2 who is having progressively worse abdominal pain  with her menstrual cycles. She has regular periods every month. When she  has her period, there is not much bleeding until she stands up and walks  and then it pours out. She has severe pain in the lower abdomen,  especially with her period but even between and it aches deep into her  thighs and down into the vaginal area and is very uncomfortable and  makes it difficult for her to stand. Her last Pap smear was in February  07 and was normal and she is being admitted and scheduled for a total  abdominal hysterectomy and possible vaginal hysterectomy.   OBSTETRICAL HISTORY:  She is gravida 5 para 2-0-3-2 and has 2 vaginal  deliveries. The youngest child is 61 years old.   PAST SURGICAL HISTORY:  She has had back surgery x3, one on C3, 4, and 5  and on at C6, also on L5-S1. She had a tubal ligation in 1992. Has  laparoscopic cholecystectomy in 1992. She had a meniscus knee repair in  2007.   FAMILY HISTORY:  Her mother and grandmother both had diabetes.  Grandfather had coronary artery disease. Her father had lung cancer was  a heavy smoker.   SOCIAL HISTORY:  She does not smoke tobacco but does smoke marijuana.  She drinks socially, 3-4 drinks a week but no illicit drugs other than  marijuana.   PAST MEDICAL HISTORY:  She has had some complaints of arthritis. Was  told she had a heart murmur; although, I heard none.   ALLERGIES:  SHE IS ALLERGIC TO SULFA.   MEDICATIONS:  The patient is currently on Xanax, hydrocodone, Zyrtec,  Flonase, and Percocet.   PHYSICAL EXAMINATION:  VITAL SIGNS:  Blood pressure is 120/77, pulse 77  per minute, temperature 98.9. The patient weighs 209 pounds and is 5  feet 4 inches tall.  GENERAL:  Well-developed, well-nourished African-American female in no  acute distress.  HEENT:  Within normal limits.  NECK:  Supple. Thyroid symmetrical with no dominant masses.  BACK:  Erect.  LUNGS:  Clear to auscultation and percussion.  HEART:  No murmur. Normal sinus rhythm.  ABDOMEN:  Soft, flat, nontender with no obvious masses.  PELVIC EXAM:  Normal. External genitalia is normal. BUS within normal  limits. The vagina is clean and well rugaeted. The cervix is clean and  parous. The uterus is retroverted and seems to be about 12-15 week size.  Difficult to really outline because of the abdominal girth and is  irregular noticed most in the posterior cul-de-sac. No adnexal masses  could be palpated because of the habitus of the patient.  EXTREMITIES:  No edema. No varices.  NEUROLOGIC:  DTRs within normal limits.   IMPRESSION:  Symptomatic leiomyomata uteri.   PLAN:  Total abdominal hysterectomy. Did a long consult with the patient  today. We discussed possible complications, especially those related to  anesthesia, injury to urinary tract or bowel, hemorrhage, and  postoperative infection. We also discussed whether or not to take out  the ovaries and we decided to keep them unless they tend to be diseased.           ______________________________  Javier Glazier Okey Dupre, M.D.     PDR/MEDQ  D:  10/29/2006  T:  10/29/2006  Job:  [id number]

## 2011-01-31 NOTE — Group Therapy Note (Signed)
NAME:  Mariah [name], Mariah [name]             ACCOUNT NO.:  [id number]   MEDICAL RECORD NO.:  [id number]          PATIENT TYPE:  WOC   LOCATION:  [facility]                   FACILITY:  [facility]   PHYSICIAN:  Montey Hora, M.D.    DATE OF BIRTH:  10-15-1960   DATE OF SERVICE:  08/12/2006                                  CLINIC NOTE   CHIEF COMPLAINT:  Low abdominal pain and fibroids.   HISTORY OF PRESENT ILLNESS:  This is a 50 year old lady who, over the  last few months, has noticed a progressively worse abdominal pain with  her menstrual cycles.  Her LMP was August 05, 2006.  She has been  having regular cycles every month.  The bleeding is light to moderate,  lasting only 3 to 4 days.  The pain, however, starts a day or two prior  to the cycle and lasts for a day or two after.  It aches deeply into her  thighs and her vaginal area, and her abdomen is very uncomfortable.  She  denies any spotting during the interval between menstrual cycles and no  bleeding with intercourse.  Her last Pap was February 2007 at  [facility] and was normal.  She has a remote history of an abnormal  Pap, but did not require any procedures, and she has had many normal  Paps since then.  She was aware that she had fibroids, but they really  had not been causing any problems until recently.   MENSTRUAL HISTORY:  Significant for onset at age 54.  She has always had  severe dysmenorrhea.  Contraception right now bilateral tubal ligation  as well as condoms.   OBSTETRICAL HISTORY:  She is a G5, P2, 0-3-2.  She had two vaginal  deliveries.   PAST SURGICAL HISTORY:  Remarkable for back surgery x3, one on C3-4-5,  one on C6, and also on L5-S1.  She had a tubal ligation in 1992, a  laparoscopic cholecystectomy in 1992, and a meniscal laparoscopic knee  repair in 2007.   FAMILY HISTORY:  Significant for diabetes in her mother and grandmother.  Paternal grandmother had a heart attack.  Father had lung cancer and was  a smoker.  She had a brother who had a blood clot and died of a  pulmonary embolus.   SOCIAL HISTORY:  No smoking, but she does drink 3 or 4 drinks a week,  and does drink 4 to 5 caffeinated beverages a week.  She denies any  sexual or physical abuse, no IV or addicting drug use other than the  medicines that are prescribed for her by the pain clinic.   PAST MEDICAL HISTORY:  Significant for a heart murmur and high  cholesterol.  She also has some arthritis.   CURRENT MEDICATIONS:  Xanax, hydrocodone, Zyrtec, Flonase and Percocet.  Pain medicines as above written by Dr. Ethelene Hal at the pain clinic.   ALLERGIES:  SULFA.   PHYSICAL EXAMINATION:  Pulse is 77, blood pressure is 128/77, weight is  209.  GENERAL:  WD,     WN, NAD.  HEART:  RRR with no murmur.  LUNGS:  CTAB, normal  respiratory effort.  ABDOMEN:  Soft and nontender, no HSM.  No obvious masses through the  abdomen.  PELVIC:  Exam shows a normal multiparous cervix in an anterior position.  The uterus is retroverted and is 12 to 15 week size.  It is noticeably  more irregular on the posterior surface of the uterus.  There is also  some suggestion of an anterior mass beneath the bladder ovary.  There  are no adnexal masses or tenderness bilaterally.   ASSESSMENT:  Severe dysmenorrhea with obviously fibroid uterus.  A  previous CT scan failed to delineate the actual size of the uterus.  We  will order a pelvic ultrasound to rule out any other ovarian or  associated pathology, and to have better measurements of the size of the  uterus.  The patient smoke today with Dr. Okey Dupre regarding a probable  abdominal hysterectomy.  He gave her some of the pros and cons of having  her ovaries removed at the same time.  She was instructed to think about  that prior to her next appointment.   PLAN:  To have the surgery scheduled and have her return to the clinic  for evaluation prior to the surgery.            ______________________________  Montey Hora, M.D.     KR/MEDQ  D:  08/12/2006  T:  08/13/2006  Job:  [id number]

## 2011-01-31 NOTE — Op Note (Signed)
Mariah [name], [name]             ACCOUNT NO.:  [id number]   MEDICAL RECORD NO.:  [id number]          PATIENT TYPE:  INP   LOCATION:  9319                          [facility]ACILITY:  [facility]   PHYSICIAN:  Mariah [name], M.D.     DATE OF BIRTH:  01-Dec-1960   DATE OF PROCEDURE:  11/02/2006  DATE OF DISCHARGE:                               OPERATIVE REPORT   PREOPERATIVE DIAGNOSIS:  Symptomatic fibroid uterus.   POSTOPERATIVE DIAGNOSIS:  Symptomatic fibroid uterus.   PROCEDURE:  Total vaginal hysterectomy.   SURGEON:  Javier Glazier. Okey [name], M.D.   ASSISTANT:  Shelbie Proctor. Shawnie Pons, M.D.   ANESTHESIA:  General and local.   SPECIMENS:  Uterus to pathology.   ESTIMATED BLOOD LOSS:  200 mL.   COMPLICATIONS:  None.   REASON FOR PRESENTATION:  The patient is a 50 year old gravida 5, para 2-  0-3-2, with two previous vaginal deliveries, who had increasing lower  abdominal pain and dysmenorrhea.  The patient also had some abnormal  vaginal bleeding as well that was heavy at times.  The patient desired  permanent treatment.   PROCEDURE:  The patient was taken to the operating room and placed in  dorsal lithotomy in Scurry stirrups.  She was prepped and draped in the  usual sterile fashion.  A weighted speculum was placed inside the  vagina, cervix visualized, grasped with two double-toothed tenaculums  superiorly and inferiorly.  Lidocaine with epinephrine 20 mL were then  injected circumferentially about the cervix.  A circumferential incision  was then made with a knife and the vaginal mucosa dissected off the  cervix bluntly.  The posterior peritoneum was entered sharply with Mayo  scissors and the posterior peritoneum tagged to the vagina with figure-  of-eight.  The uterosacral ligaments were identified, clamped, cut, and  suture ligated with a Heaney-type stitch and held.  The Gyrus was then  used to take several bites on either side of the broad ligament to  include the uterine arteries.  Attempt was  made to flip the uterus;  however, it could not easily be brought out of the incision and there  were multiple fibroids noted, so a coring method of the uterus was  undertaken.  The patient's left tubo-ovarian pedicle was the first  identified, a free tie placed about this and then the Gyrus used to  cauterize that pedicle, and this was cut.  Excellent hemostasis was  noted.  There was a large fibroid coming off the fundus to the patient's  right side, which made that side more difficult to get to the tubo-  ovarian pedicle, so a free tie was placed about this but the pedicle was  so large that a Heaney clamp was used to grasp this pedicle, which was  cut and the uterus was freed.  The uterus weighed 223 g and the specimen  was sent to pathology.  A Heaney-type stitch was then used to secure the  tubo-ovarian pedicle on that side and it was felt to be hemostatic.  The  uterosacral on the patient's left side, the suture had inadvertently  been cut  and so a tonsil was used to re-grab it and a figure-of-eight  used to achieve hemostasis when the uterosacral was tied to the vaginal  cuff at the end.  Some locked running sutures were used to achieve  hemostasis, which was done.  All pedicles were looked at again prior to  the end of the case.  They were all felt to be hemostatic and all  sutures were cut.  The vagina was closed, incorporating the uterosacral  ligaments on either end of the incision horizontally.  Locked running #1  chromic suture was used for this process.  At the end excellent  hemostasis was noted again.  A Foley catheter was placed inside the  bladder and clear yellow urine noted.  The vagina was packed with  iodoform gauze.  The patient tolerated the procedure well.  All  instrument, needle and lap counts were correct x2.  The patient was  awakened and taken to recovery in stable condition.     ______________________________  Shelbie Proctor Shawnie Pons, M.D.     ______________________________  Javier Glazier. Okey [name], M.D.    TSP/MEDQ  D:  11/02/2006  T:  11/02/2006  Job:  [id number]

## 2011-01-31 NOTE — Discharge Summary (Signed)
NAMEJORENE, Mariah [name]             ACCOUNT NO.:  [id number]   MEDICAL RECORD NO.:  [id number]          PATIENT TYPE:  INP   LOCATION:  9319                          [facility]   PHYSICIAN:  Phil D. Okey Dupre, M.D.     DATE OF BIRTH:  [birth year]   DATE OF ADMISSION:  11/02/2006  DATE OF DISCHARGE:  11/04/2006                               DISCHARGE SUMMARY   The patient is a 50 year old African-American female who underwent total  vaginal hysterectomy for symptomatic fibroids on the day of admission  and did extremely well postoperatively with the exception of some  elevations in blood pressure with the systolics in the 150s to 160s,  although for the most part the diastolic never got above 90.  She was  started then on hydrochlorothiazide.  Her postoperative course was  afebrile.  On physical examination on discharge her lungs were clear to  auscultation and percussion.  The heart no murmur, normal sinus rhythm.  Abdomen was soft, flat, nontender, no masses, no organomegaly, no  rebounds, no guarding, no genital bleeding.  Extremities were normal  with no Homans sign.  The patient has been given strict followup  directions as to activity, followup in the clinic, diet, and has been  discharged on Slow Fe for hemoglobin of 11, Percocet and Motrin 600 for  pain, and hydrochlorothiazide.  Will recheck her blood pressure when she  comes in in 2 weeks to the GYN clinic as appointment has been made, and  adjust medication as needed.  We also counseled her to get an internal  medicine specialist, which she plans on doing.   DISCHARGE DIAGNOSIS:  Pathology pending, symptomatic fibroid tumors.           ______________________________  Javier Glazier Okey Dupre, M.D.     PDR/MEDQ  D:  11/05/2006  T:  11/05/2006  Job:  [id number]

## 2011-01-31 NOTE — Group Therapy Note (Signed)
NAME:  Mariah [name], Mariah [name]             ACCOUNT NO.:  [id number]   MEDICAL RECORD NO.:  [id number]          PATIENT TYPE:  WOC   LOCATION:  [facility]                   FACILITY:  [facility]   PHYSICIAN:  Elsie Lincoln, MD      DATE OF BIRTH:  [birth year]   DATE OF SERVICE:  12/16/2006                                  CLINIC NOTE   The patient is a 50 year old female who presents status post  hysterectomy for bad uterine abdominal pain.  The patient had a benign  postoperative course.  Her pathology was benign.  She has never had any  freezing or burning of her cervix or abnormal Pap smear to her  knowledge, though she no longer needs Pap smears.  The patient is eager  to go back to sexual activity if she can today, because she had a great  benign exam.  The patient has some chronic headaches that are now  relieved when she is on blood pressure medication.  She is due for a  mammogram, her last 1 was in 2006, and we will order that today.   PHYSICAL EXAM:  Pulse 88, blood pressure 118/66, weight 204.4, height 5  feet 5 inches.  GENERAL:  Well-developed, well-nourished in no acute distress.  GENITOURINARY:  Tanner V genitalia.  Vaginal pink.  Normal rugae.  Vaginal apex and cuff intact.  On bimanual, there are no masses.  Nontender.   ASSESSMENT AND PLAN:  A 50 year old female with normal postoperative  exam.  1. The patient has mild menopausal symptoms.  Does not want any      medications to help her through this.  2. Mammogram ordered.  3. Return to clinic in a year.           ______________________________  Elsie Lincoln, MD     KL/MEDQ  D:  12/16/2006  T:  12/16/2006  Job:  [id number]

## 2011-03-11 ENCOUNTER — Encounter: Payer: Self-pay | Admitting: Family Medicine

## 2011-03-11 ENCOUNTER — Ambulatory Visit (INDEPENDENT_AMBULATORY_CARE_PROVIDER_SITE_OTHER): Payer: Medicaid Other | Admitting: Family Medicine

## 2011-03-11 VITALS — BP 139/84 | HR 49 | Temp 97.9°F | Wt 223.0 lb

## 2011-03-11 DIAGNOSIS — J309 Allergic rhinitis, unspecified: Secondary | ICD-10-CM

## 2011-03-11 DIAGNOSIS — I10 Essential (primary) hypertension: Secondary | ICD-10-CM

## 2011-03-11 DIAGNOSIS — K219 Gastro-esophageal reflux disease without esophagitis: Secondary | ICD-10-CM

## 2011-03-11 MED ORDER — OMEPRAZOLE 20 MG PO CPDR: DELAYED_RELEASE_CAPSULE | ORAL | Status: DC

## 2011-03-11 MED ORDER — HYDROCHLOROTHIAZIDE 25 MG PO TABS: 25.0000 mg | ORAL_TABLET | Freq: Every day | ORAL | Status: DC

## 2011-03-11 MED ORDER — ALPRAZOLAM 1 MG PO TABS: 1.0000 mg | ORAL_TABLET | Freq: Three times a day (TID) | ORAL | Status: DC | PRN

## 2011-03-11 MED ORDER — FLUTICASONE PROPIONATE 50 MCG/ACT NA SUSP: 1.0000 | Freq: Every day | NASAL | Status: DC

## 2011-03-11 MED ORDER — ATENOLOL 50 MG PO TABS: 50.0000 mg | ORAL_TABLET | Freq: Every day | ORAL | Status: DC

## 2011-03-11 NOTE — Assessment & Plan Note (Signed)
Improved.  Now asymptomatic

## 2011-03-11 NOTE — Assessment & Plan Note (Signed)
>>  ASSESSMENT AND PLAN FOR HYPERTENSION, BENIGN WRITTEN ON 03/11/2011 10:32 AM BY Jonnie KindSAUNDERS, WESTON W, MD  Currently at goal.  No changes today.

## 2011-03-11 NOTE — Assessment & Plan Note (Signed)
Currently at goal.  No changes today.

## 2011-03-11 NOTE — Assessment & Plan Note (Signed)
Worse since stopping the Omeprazole.  Will restart

## 2011-03-11 NOTE — Progress Notes (Signed)
  Subjective:    Patient ID: Mariah [name], female    DOB: 06-24-1961, 50 y.o.   MRN: [id number]  HPI 1.  HTN:  She is taking her medications as prescribed.  She doesn't check her BP regularly.   2. Allergies:  Improved with the Zyrtec and saline nasal spray.  She can breath comfortably.  Doesn't have any sinus pain / pressure  3. GERD:  She hasn't been taking the Omeprazole she feels like it has gotten worse since she stopped taking the medicine.   Review of Systems Denies chest pain, shortness of breath, headache, n/v/d    Objective:   Physical Exam  Constitutional: No distress.       Obese  HENT:  Nose: Nose normal.  Mouth/Throat: Oropharynx is clear and moist. No oropharyngeal exudate.       Non-tender sinuses  Eyes: Conjunctivae and EOM are normal. Pupils are equal, round, and reactive to light. Right eye exhibits no discharge. Left eye exhibits no discharge. No scleral icterus.  Neck: Normal range of motion. Neck supple.  Cardiovascular: Normal rate, regular rhythm and normal heart sounds.   Pulmonary/Chest: Effort normal and breath sounds normal. No respiratory distress. She has no wheezes.  Abdominal: Soft. Bowel sounds are normal. She exhibits no distension. There is no tenderness. There is no guarding.  Musculoskeletal: Normal range of motion. She exhibits no edema.  Skin: Skin is warm and dry. No rash noted. She is not diaphoretic. No erythema.  Psychiatric: She has a normal mood and affect.          Assessment & Plan:

## 2011-06-04 ENCOUNTER — Encounter: Payer: Self-pay | Admitting: Family Medicine

## 2011-06-04 ENCOUNTER — Ambulatory Visit (INDEPENDENT_AMBULATORY_CARE_PROVIDER_SITE_OTHER): Payer: Medicaid Other | Admitting: Family Medicine

## 2011-06-04 DIAGNOSIS — I10 Essential (primary) hypertension: Secondary | ICD-10-CM

## 2011-06-04 DIAGNOSIS — R109 Unspecified abdominal pain: Secondary | ICD-10-CM

## 2011-06-04 DIAGNOSIS — E669 Obesity, unspecified: Secondary | ICD-10-CM

## 2011-06-04 DIAGNOSIS — R198 Other specified symptoms and signs involving the digestive system and abdomen: Secondary | ICD-10-CM | POA: Insufficient documentation

## 2011-06-04 DIAGNOSIS — J309 Allergic rhinitis, unspecified: Secondary | ICD-10-CM

## 2011-06-04 DIAGNOSIS — R51 Headache: Secondary | ICD-10-CM

## 2011-06-04 DIAGNOSIS — Z1239 Encounter for other screening for malignant neoplasm of breast: Secondary | ICD-10-CM

## 2011-06-04 MED ORDER — FLUTICASONE PROPIONATE 50 MCG/ACT NA SUSP: 1.0000 | Freq: Every day | NASAL | Status: DC

## 2011-06-04 MED ORDER — ALPRAZOLAM 1 MG PO TABS: 1.0000 mg | ORAL_TABLET | Freq: Three times a day (TID) | ORAL | Status: DC | PRN

## 2011-06-04 MED ORDER — OMEPRAZOLE 20 MG PO CPDR: DELAYED_RELEASE_CAPSULE | ORAL | Status: DC

## 2011-06-04 NOTE — Assessment & Plan Note (Signed)
New onset, no red flags. Likely secondary to stress given her recent changes in her social life, location of HA and relief with NSAIDs. Pt agrees. She was taking BC powders so I recommend she switch to Tylenol or IBU to avoid analgesic rebound headache. She will let us know if headache does not improve, and she was made aware of red flags to look out for.

## 2011-06-04 NOTE — Assessment & Plan Note (Signed)
Well controlled. Continue current regimen. Will check CMet.

## 2011-06-04 NOTE — Assessment & Plan Note (Signed)
Pt's weight was 221 lbs today and is obese. She is ready to make a change. Her last cholesterol was elevated, and we will recheck it one morning when she is fasting. We will ask Dr. Gerilyn Pilgrim to see her for nutritional assessment and counseling. As far as her exercise goes, she states she cannot walk as far as she once could due to back pain. Recommended recumbent bike or water exercises. I gave her a Rx to take the to [facility] to see if she could get water aerobics for a discounted price. She will let us know if we need to fill out any additional paperwork for her.

## 2011-06-04 NOTE — Assessment & Plan Note (Signed)
>>  ASSESSMENT AND PLAN FOR HYPERTENSION, BENIGN WRITTEN ON 06/04/2011  4:50 PM BY HAIRFORD, AMBER M, MD  Well controlled. Continue current regimen. Will check CMet.

## 2011-06-04 NOTE — Assessment & Plan Note (Signed)
Mammogram due. Given number to schedule.
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
## 2011-06-04 NOTE — Assessment & Plan Note (Addendum)
Likely GI viral illness since she has sick contacts. Pt states it is not interfering with her life right now. Continue to drink plenty of fluids and let us know if it gets worse.

## 2011-06-04 NOTE — Patient Instructions (Signed)
It was so nice to see you today!  I sent your refills to the pharmacy. Please come back one morning before you eat to have your cholesterol and blood work checked. I will send you a letter with those results  For your headaches, it appears they may be associated with stress (or also known as a tension headache.) I know the BC powders helped, but we do not want to you take TOO much of those because they can cause headaches. Instead, taking ibuprofen or tylenol scheduled may prevent your headaches.  I hope your belly feels better! If you do not have resolution of your symptoms, please let me know.  I will arrange for Dr. Gerilyn Pilgrim to call you to make a nutrition appointment. We will also get your mammogram scheduled for you.  Come back to see me in about 3 months!  Take care [employer]. Taisa Deloria, M.D.

## 2011-06-16 LAB — COMPREHENSIVE METABOLIC PANEL
ALT: 16
AST: 14
Albumin: 3.8
Alkaline Phosphatase: 61
BUN: 10
CO2: 28
Calcium: 9.2
Chloride: 102
Creatinine, Ser: 0.88
GFR calc Af Amer: >60
GFR calc non Af Amer: >60
Glucose, Bld: 91
Potassium: 3.3 — ABNORMAL LOW
Sodium: 137
Total Bilirubin: 0.4
Total Protein: 6.7

## 2011-06-16 LAB — CBC
HCT: 37.6
HCT: 40.5
Hemoglobin: 12.3
Hemoglobin: 13.2
MCHC: 32.7
MCHC: 32.7
MCV: 87.4
MCV: 87.7
Platelets: 235
Platelets: 256
RBC: 4.3
RBC: 4.62
RDW: 14.2
RDW: 14.6
WBC: 8.4
WBC: 8.9

## 2011-06-16 LAB — URINALYSIS, ROUTINE W REFLEX MICROSCOPIC
Bilirubin Urine: NEGATIVE
Glucose, UA: NEGATIVE
Hgb urine dipstick: NEGATIVE
Ketones, ur: NEGATIVE
Nitrite: NEGATIVE
Protein, ur: NEGATIVE
Specific Gravity, Urine: 1.01
Urobilinogen, UA: 0.2
pH: 6

## 2011-06-16 LAB — CARDIAC PANEL(CRET KIN+CKTOT+MB+TROPI)
CK, MB: 1.2
CK, MB: 1.2
Relative Index: 1.1
Relative Index: 1.2
Total CK: 104
Total CK: 113
Troponin I: 0.07 — ABNORMAL HIGH
Troponin I: 0.08 — ABNORMAL HIGH

## 2011-06-16 LAB — DIFFERENTIAL
Basophils Absolute: 0
Basophils Relative: 0
Eosinophils Absolute: 0.2
Eosinophils Relative: 3
Lymphocytes Relative: 35
Lymphs Abs: 3
Monocytes Absolute: 0.6
Monocytes Relative: 7
Neutro Abs: 4.6
Neutrophils Relative %: 55

## 2011-06-16 LAB — BASIC METABOLIC PANEL
BUN: 9
CO2: 25
Calcium: 8.6
Chloride: 102
Creatinine, Ser: 0.59
GFR calc Af Amer: >60
GFR calc non Af Amer: >60
Glucose, Bld: 96
Potassium: 3.9
Sodium: 132 — ABNORMAL LOW

## 2011-06-16 LAB — ETHANOL: Alcohol, Ethyl (B): <5

## 2011-06-16 LAB — CK TOTAL AND CKMB (NOT AT ARMC)
CK, MB: 1.2
Relative Index: 0.9
Total CK: 135

## 2011-06-16 LAB — PROTIME-INR
INR: 1
Prothrombin Time: 12.9

## 2011-06-16 LAB — RAPID URINE DRUG SCREEN, HOSP PERFORMED
Amphetamines: NOT DETECTED
Barbiturates: NOT DETECTED
Benzodiazepines: POSITIVE — AB
Cocaine: NOT DETECTED
Opiates: NOT DETECTED
Tetrahydrocannabinol: POSITIVE — AB

## 2011-06-16 LAB — LIPID PANEL
Cholesterol: 187
HDL: 27 — ABNORMAL LOW
LDL Cholesterol: 116 — ABNORMAL HIGH
Total CHOL/HDL Ratio: 6.9
Triglycerides: 220 — ABNORMAL HIGH
VLDL: 44 — ABNORMAL HIGH

## 2011-06-16 LAB — TSH: TSH: 1.693

## 2011-06-16 LAB — HEMOGLOBIN A1C
Hgb A1c MFr Bld: 6.2 — ABNORMAL HIGH
Mean Plasma Glucose: 131

## 2011-06-16 LAB — SEDIMENTATION RATE: Sed Rate: 25 — ABNORMAL HIGH

## 2011-06-16 LAB — MAGNESIUM: Magnesium: 2.3

## 2011-06-16 LAB — B-NATRIURETIC PEPTIDE (CONVERTED LAB): Pro B Natriuretic peptide (BNP): <32

## 2011-06-16 LAB — C-REACTIVE PROTEIN: CRP: 0.4 — ABNORMAL LOW (ref <0.6)

## 2011-06-16 LAB — TROPONIN I: Troponin I: 0.07 — ABNORMAL HIGH

## 2011-06-16 LAB — APTT: aPTT: 28

## 2011-06-20 ENCOUNTER — Inpatient Hospital Stay (INDEPENDENT_AMBULATORY_CARE_PROVIDER_SITE_OTHER)
Admission: RE | Admit: 2011-06-20 | Discharge: 2011-06-20 | Disposition: A | Discharge status: Home / Self Care | Payer: Medicaid Other | Source: Ambulatory Visit | Attending: Emergency Medicine | Admitting: Emergency Medicine

## 2011-06-20 DIAGNOSIS — M542 Cervicalgia: Secondary | ICD-10-CM

## 2011-06-20 DIAGNOSIS — M62838 Other muscle spasm: Secondary | ICD-10-CM

## 2011-06-20 LAB — URINALYSIS, ROUTINE W REFLEX MICROSCOPIC
Bilirubin Urine: NEGATIVE
Glucose, UA: NEGATIVE mg/dL
Ketones, ur: NEGATIVE mg/dL
Nitrite: NEGATIVE
Protein, ur: NEGATIVE mg/dL
Specific Gravity, Urine: 1.025 (ref 1.005–1.030)
Urobilinogen, UA: 0.2 mg/dL (ref 0.0–1.0)
pH: 6 (ref 5.0–8.0)

## 2011-06-20 LAB — URINE MICROSCOPIC-ADD ON

## 2011-06-20 LAB — GC/CHLAMYDIA PROBE AMP, GENITAL
Chlamydia, DNA Probe: NEGATIVE
GC Probe Amp, Genital: NEGATIVE

## 2011-06-20 LAB — WET PREP, GENITAL
Trich, Wet Prep: NONE SEEN
Yeast Wet Prep HPF POC: NONE SEEN

## 2011-06-24 ENCOUNTER — Ambulatory Visit: Payer: Medicaid Other | Admitting: Family Medicine

## 2011-08-05 ENCOUNTER — Telehealth: Payer: Self-pay | Admitting: Family Medicine

## 2011-08-05 MED ORDER — ALPRAZOLAM 1 MG PO TABS: 1.0000 mg | ORAL_TABLET | Freq: Three times a day (TID) | ORAL | Status: DC | PRN

## 2011-08-05 NOTE — Telephone Encounter (Signed)
PCP not available. Will refill alprazolam x 1 month.

## 2011-09-25 ENCOUNTER — Ambulatory Visit: Payer: Medicaid Other | Admitting: Family Medicine

## 2011-10-01 ENCOUNTER — Ambulatory Visit (INDEPENDENT_AMBULATORY_CARE_PROVIDER_SITE_OTHER): Payer: Medicaid Other | Admitting: Family Medicine

## 2011-10-01 ENCOUNTER — Encounter: Payer: Self-pay | Admitting: Family Medicine

## 2011-10-01 VITALS — BP 138/83 | HR 68 | Temp 98.6°F | Ht 65.0 in | Wt 233.3 lb

## 2011-10-01 DIAGNOSIS — T7491XA Unspecified adult maltreatment, confirmed, initial encounter: Secondary | ICD-10-CM

## 2011-10-01 DIAGNOSIS — N63 Unspecified lump in unspecified breast: Secondary | ICD-10-CM

## 2011-10-01 DIAGNOSIS — L259 Unspecified contact dermatitis, unspecified cause: Secondary | ICD-10-CM

## 2011-10-01 DIAGNOSIS — L309 Dermatitis, unspecified: Secondary | ICD-10-CM

## 2011-10-01 DIAGNOSIS — F410 Panic disorder [episodic paroxysmal anxiety] without agoraphobia: Secondary | ICD-10-CM

## 2011-10-01 MED ORDER — ALPRAZOLAM 1 MG PO TABS: 1.0000 mg | ORAL_TABLET | Freq: Three times a day (TID) | ORAL | Status: DC | PRN
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
## 2011-10-16 ENCOUNTER — Ambulatory Visit
Admission: RE | Admit: 2011-10-16 | Discharge: 2011-10-16 | Disposition: A | Discharge status: Home / Self Care | Payer: Medicaid Other | Source: Ambulatory Visit | Attending: Family Medicine | Admitting: Family Medicine

## 2011-10-16 ENCOUNTER — Other Ambulatory Visit: Payer: Self-pay | Admitting: Family Medicine

## 2011-10-16 DIAGNOSIS — N63 Unspecified lump in unspecified breast: Secondary | ICD-10-CM

## 2011-10-17 ENCOUNTER — Ambulatory Visit (HOSPITAL_COMMUNITY)
Admission: RE | Admit: 2011-10-17 | Discharge: 2011-10-17 | Disposition: A | Discharge status: Home / Self Care | Payer: Medicaid Other | Source: Ambulatory Visit | Attending: Family Medicine | Admitting: Family Medicine

## 2011-10-17 DIAGNOSIS — T7491XA Unspecified adult maltreatment, confirmed, initial encounter: Secondary | ICD-10-CM

## 2011-10-17 DIAGNOSIS — M25539 Pain in unspecified wrist: Secondary | ICD-10-CM | POA: Insufficient documentation

## 2011-11-28 ENCOUNTER — Ambulatory Visit (INDEPENDENT_AMBULATORY_CARE_PROVIDER_SITE_OTHER): Payer: Medicaid Other | Admitting: Family Medicine

## 2011-11-28 ENCOUNTER — Encounter: Payer: Self-pay | Admitting: Family Medicine

## 2011-11-28 VITALS — BP 128/84 | HR 64 | Temp 98.2°F | Ht 65.0 in | Wt 224.0 lb

## 2011-11-28 DIAGNOSIS — K219 Gastro-esophageal reflux disease without esophagitis: Secondary | ICD-10-CM

## 2011-11-28 DIAGNOSIS — F411 Generalized anxiety disorder: Secondary | ICD-10-CM

## 2011-11-28 DIAGNOSIS — J309 Allergic rhinitis, unspecified: Secondary | ICD-10-CM

## 2011-11-28 DIAGNOSIS — I82409 Acute embolism and thrombosis of unspecified deep veins of unspecified lower extremity: Secondary | ICD-10-CM

## 2011-11-28 DIAGNOSIS — J069 Acute upper respiratory infection, unspecified: Secondary | ICD-10-CM

## 2011-11-28 MED ORDER — RIVAROXABAN 15 MG PO TABS: 15.0000 mg | ORAL_TABLET | Freq: Two times a day (BID) | ORAL | Status: DC

## 2011-11-28 MED ORDER — OMEPRAZOLE 20 MG PO CPDR: DELAYED_RELEASE_CAPSULE | ORAL | Status: DC

## 2011-11-28 NOTE — Patient Instructions (Addendum)
Dear Mariah [name],   It was great to see you today. Thank you for coming to clinic. Please read below regarding the issues that we discussed.   1. You have an upper respiratory infection (cough, congestion, runny nose). I think the congestion is running down the back of your throat and making you cough. i want you to try a Neti Pot to try to clear out some congestion and I think that will help your cough. You can try a teaspoon of honey at bedtime as well.  2. I would like for you to follow up with Dr. Mikel Cella in the next few weeks to talk about a refill on your xanax, possibly a getting a prescription for water aerobics.  3. Keep up the great work on baking instead of frying. You have lost 10 lbs in 3 months and that is excellent.  4. We refilled your reflux medicine today. I want you to try to just take it once in the morning and see if that helps throughout the day. You can take it before dinner as well if that is not helping.   Please follow up in clinic in 2 weeks . Please call earlier if you have any questions or concerns.   Sincerely,  Dr. Tana Conch

## 2011-11-29 DIAGNOSIS — J069 Acute upper respiratory infection, unspecified: Secondary | ICD-10-CM | POA: Insufficient documentation

## 2011-11-29 NOTE — Progress Notes (Signed)
  Subjective:    Patient ID: Mariah [name], female    DOB: September 01, 1961, 51 y.o.   MRN: [id number]  HPI 51 year old famale with allergic rhinitis, GERD, anxiety presenting with URI symptoms and for refills.   1. URI symptoms-patient with cough, congestion, body aches for 2-3 days. Afebrile during this time and without chills. Cough midly productive with clear sputum. Has been taking alka seltzer cold, orange juice, and cough drops. Granddaughter has been sick with similar symptoms. Feels like she "cant cough stuff up" like she wants to.   2. Reviewed results of wrist x-ray with patient from visit a month ago  3. Anxiety-requested Xanax refill-deferred to PCP  4. GERD-patient says she ran out of prilosec and she has had a lot of discomfort after meals. Requests refills. Discussed trying to use just once a day in AM and patient agreeable.   Review of Systems negative except as noted in HPI  Objective:   Physical Exam  Constitutional: She is oriented to person, place, and time. She appears well-developed and well-nourished. No distress.  HENT:  Head: Normocephalic and atraumatic.  Right Ear: Tympanic membrane normal.  Left Ear: Tympanic membrane normal.  Mouth/Throat: Mucous membranes are normal. Oropharyngeal exudate present.  Eyes: Conjunctivae and EOM are normal. Pupils are equal, round, and reactive to light.  Neck: Normal range of motion. Neck supple.  Cardiovascular: Normal rate and regular rhythm.  Exam reveals no gallop and no friction rub.   No murmur heard. Pulmonary/Chest: Effort normal and breath sounds normal. No respiratory distress. She has no wheezes. She has no rales.  Abdominal: Soft. Bowel sounds are normal. She exhibits no distension. There is no rebound and no guarding.  Musculoskeletal: Normal range of motion. She exhibits no tenderness.  Lymphadenopathy:    She has no cervical adenopathy.  Neurological: She is alert and oriented to person, place, and time.  Skin:  Skin is warm and dry. She is not diaphoretic.   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 Discussed with patient and cancelled order. Called [facility] to cancel prescription.

## 2011-11-29 NOTE — Assessment & Plan Note (Signed)
Requested refill of Xanax. Deferred to PCP.

## 2011-11-29 NOTE — Assessment & Plan Note (Signed)
Symptomatic treatment per AVS 

## 2011-11-29 NOTE — Assessment & Plan Note (Signed)
Worsened off of prilosec. Refilled medication and encouraged once daily dosing and if not improved with AM dose to consider PM dose.

## 2011-11-29 NOTE — Assessment & Plan Note (Addendum)
Continues to take flonase and cetirizine. Patient believes these  Symptoms have been well controlled on this regimen.

## 2011-12-17 ENCOUNTER — Encounter (HOSPITAL_COMMUNITY): Payer: Self-pay | Admitting: *Deleted

## 2011-12-17 ENCOUNTER — Emergency Department (HOSPITAL_COMMUNITY): Payer: Medicaid Other

## 2011-12-17 ENCOUNTER — Other Ambulatory Visit: Payer: Self-pay

## 2011-12-17 ENCOUNTER — Emergency Department (HOSPITAL_COMMUNITY)
Admission: EM | Admit: 2011-12-17 | Discharge: 2011-12-17 | Disposition: A | Discharge status: Home / Self Care | Payer: Medicaid Other | Attending: Emergency Medicine | Admitting: Emergency Medicine

## 2011-12-17 DIAGNOSIS — R079 Chest pain, unspecified: Secondary | ICD-10-CM | POA: Insufficient documentation

## 2011-12-17 DIAGNOSIS — K219 Gastro-esophageal reflux disease without esophagitis: Secondary | ICD-10-CM | POA: Insufficient documentation

## 2011-12-17 DIAGNOSIS — R1013 Epigastric pain: Secondary | ICD-10-CM | POA: Insufficient documentation

## 2011-12-17 DIAGNOSIS — Z79899 Other long term (current) drug therapy: Secondary | ICD-10-CM | POA: Insufficient documentation

## 2011-12-17 HISTORY — DX: Gastro-esophageal reflux disease without esophagitis: K21.9

## 2011-12-17 LAB — CBC
HCT: 41.9 % (ref 36.0–46.0)
Hemoglobin: 13.7 g/dL (ref 12.0–15.0)
MCH: 27.9 pg (ref 26.0–34.0)
MCHC: 32.7 g/dL (ref 30.0–36.0)
MCV: 85.3 fL (ref 78.0–100.0)
Platelets: 287 10*3/uL (ref 150–400)
RBC: 4.91 MIL/uL (ref 3.87–5.11)
RDW: 14.6 % (ref 11.5–15.5)
WBC: 7.3 10*3/uL (ref 4.0–10.5)

## 2011-12-17 LAB — BASIC METABOLIC PANEL
BUN: 9 mg/dL (ref 6–23)
CO2: 24 mEq/L (ref 19–32)
Calcium: 9.3 mg/dL (ref 8.4–10.5)
Chloride: 103 mEq/L (ref 96–112)
Creatinine, Ser: 0.6 mg/dL (ref 0.50–1.10)
GFR calc Af Amer: >90 mL/min (ref >90)
GFR calc non Af Amer: >90 mL/min (ref >90)
Glucose, Bld: 101 mg/dL — ABNORMAL HIGH (ref 70–99)
Potassium: 4.5 mEq/L (ref 3.5–5.1)
Sodium: 137 mEq/L (ref 135–145)

## 2011-12-17 LAB — TROPONIN I: Troponin I: <0.3 ng/mL (ref <0.30)

## 2011-12-17 MED ORDER — GI COCKTAIL ~~LOC~~
30.0000 mL | Freq: Once | ORAL | Status: AC
  Administered 2011-12-17: 30 mL via ORAL
  Filled 2011-12-17: qty 30

## 2011-12-17 MED ORDER — ASPIRIN 81 MG PO CHEW
324.0000 mg | CHEWABLE_TABLET | Freq: Once | ORAL | Status: AC
  Administered 2011-12-17: 324 mg via ORAL
  Filled 2011-12-17: qty 4

## 2011-12-17 MED ORDER — OMEPRAZOLE 20 MG PO CPDR: 20.0000 mg | DELAYED_RELEASE_CAPSULE | Freq: Every day | ORAL | Status: DC

## 2011-12-17 NOTE — ED Notes (Signed)
Pt c/o epigastric pain that radiates to her back.  She has a hx of GERD.  She reports that it is more irritation with nausea and small amounts of emesis that contained food particles. She saw her PCP last week and her doctor prescribed her prilosec which she has not yet started.  Awaiting prescription. She denies financial difficulty in obtaining her medication,.

## 2011-12-17 NOTE — ED Provider Notes (Signed)
Medical screening examination/treatment/procedure(s) were performed by non-physician practitioner and as supervising physician I was immediately available for consultation/collaboration.  Ethelda Chick, MD 12/17/11 [phone]

## 2011-12-17 NOTE — Discharge Instructions (Signed)
Diet for GERD or PUD  Nutrition therapy can help ease the discomfort of gastroesophageal reflux disease (GERD) and peptic ulcer disease (PUD).   HOME CARE INSTRUCTIONS    Eat your meals slowly, in a relaxed setting.   Eat 5 to 6 small meals per day.   If a food causes distress, stop eating it for a period of time.  FOODS TO AVOID   Coffee, regular or decaffeinated.   Cola beverages, regular or low calorie.   Tea, regular or decaffeinated.   Pepper.   Cocoa.   High fat foods, including meats.   Butter, margarine, hydrogenated oil (trans fats).   Peppermint or spearmint (if you have GERD).   Fruits and vegetables if not tolerated.   Alcohol.   Nicotine (smoking or chewing). This is one of the most potent stimulants to acid production in the gastrointestinal tract.   Any food that seems to aggravate your condition.  If you have questions regarding your diet, ask your caregiver or a registered dietitian.  TIPS   Lying flat may make symptoms worse. Keep the head of your bed raised 6 to 9 inches (15 to 23 cm) by using a foam wedge or blocks under the legs of the bed.   Do not lay down until 3 hours after eating a meal.   Daily physical activity may help reduce symptoms.  MAKE SURE YOU:    Understand these instructions.   Will watch your condition.   Will get help right away if you are not doing well or get worse.  Document Released: 09/01/2005 Document Revised: 08/21/2011 Document Reviewed: 07/18/2011  [employer] Patient Information 2012 [employer]    Gastroesophageal Reflux Disease, Adult  Gastroesophageal reflux disease (GERD) happens when acid from your stomach flows up into the esophagus. When acid comes in contact with the esophagus, the acid causes soreness (inflammation) in the esophagus. Over time, GERD may create small holes (ulcers) in the lining of the esophagus.  CAUSES    Increased body weight. This puts pressure on the stomach, making acid rise from the stomach into the  esophagus.   Smoking. This increases acid production in the stomach.   Drinking alcohol. This causes decreased pressure in the lower esophageal sphincter (valve or ring of muscle between the esophagus and stomach), allowing acid from the stomach into the esophagus.   Late evening meals and a full stomach. This increases pressure and acid production in the stomach.   A malformed lower esophageal sphincter.  Sometimes, no cause is found.  SYMPTOMS    Burning pain in the lower part of the mid-chest behind the breastbone and in the mid-stomach area. This may occur twice a week or more often.   Trouble swallowing.   Sore throat.   Dry cough.   Asthma-like symptoms including chest tightness, shortness of breath, or wheezing.  DIAGNOSIS   Your caregiver may be able to diagnose GERD based on your symptoms. In some cases, X-rays and other tests may be done to check for complications or to check the condition of your stomach and esophagus.  TREATMENT   Your caregiver may recommend over-the-counter or prescription medicines to help decrease acid production. Ask your caregiver before starting or adding any new medicines.   HOME CARE INSTRUCTIONS    Change the factors that you can control. Ask your caregiver for guidance concerning weight loss, quitting smoking, and alcohol consumption.   Avoid foods and drinks that make your symptoms worse, such as:   Caffeine or   alcoholic drinks.   Chocolate.   Peppermint or mint flavorings.   Garlic and onions.   Spicy foods.   Citrus fruits, such as oranges, lemons, or limes.   Tomato-based foods such as sauce, chili, salsa, and pizza.   Fried and fatty foods.   Avoid lying down for the 3 hours prior to your bedtime or prior to taking a nap.   Eat small, frequent meals instead of large meals.   Wear loose-fitting clothing. Do not wear anything tight around your waist that causes pressure on your stomach.   Raise the head of your bed 6 to 8 inches with wood blocks to  help you sleep. Extra pillows will not help.   Only take over-the-counter or prescription medicines for pain, discomfort, or fever as directed by your caregiver.   Do not take aspirin, ibuprofen, or other nonsteroidal anti-inflammatory drugs (NSAIDs).  SEEK IMMEDIATE MEDICAL CARE IF:    You have pain in your arms, neck, jaw, teeth, or back.   Your pain increases or changes in intensity or duration.   You develop nausea, vomiting, or sweating (diaphoresis).   You develop shortness of breath, or you faint.   Your vomit is green, yellow, black, or looks like coffee grounds or blood.   Your stool is red, bloody, or black.  These symptoms could be signs of other problems, such as heart disease, gastric bleeding, or esophageal bleeding.  MAKE SURE YOU:    Understand these instructions.   Will watch your condition.   Will get help right away if you are not doing well or get worse.  Document Released: 06/11/2005 Document Revised: 08/21/2011 Document Reviewed: 03/21/2011  [employer] Patient Information 2012 [employer].

## 2011-12-17 NOTE — ED Notes (Signed)
Pt returned from radiology w/o incident. 

## 2011-12-17 NOTE — ED Notes (Signed)
Patient transported to X-ray 

## 2011-12-17 NOTE — ED Notes (Signed)
Pt reports complete relief from GI cocktail.

## 2011-12-17 NOTE — ED Provider Notes (Signed)
History     CSN: [id number]  Arrival date & time 12/17/11  4098   First MD Initiated Contact with Patient 12/17/11 0701      Chief Complaint  Patient presents with  . Chest Pain    (Consider location/radiation/quality/duration/timing/severity/associated sxs/prior treatment) HPI  Pt presents to the ED with complaints of chest pains. The pains started on Sunday and have been intermittent. The patient states she has a significant history for GERD and has run out of her Prilosec, which she needs to go pick up today along with her BP medications. She denies having had heart trouble before but has been told that she has a murmur. Her pain is epigastric in nature. She denies SOB and wheezing She admits to having friend fish and chicken right before these symptoms started, which she knows she is not supposed to eat because it upsets her stomach.  Past Medical History  Diagnosis Date  . GERD (gastroesophageal reflux disease)     Past Surgical History  Procedure Date  . Cholecystectomy   . Back surgery   . Abdominal hysterectomy     No family history on file.  History  Substance Use Topics  . Smoking status: Former Smoker    Quit date: 09/16/1995  . Smokeless tobacco: Never Used  . Alcohol Use: Yes    OB History    Grav Para Term Preterm Abortions TAB SAB Ect Mult Living                  Review of Systems  All other systems reviewed and are negative.    Allergies  Lisinopril; Moxifloxacin; and Sulfonamide derivatives  Home Medications   Current Outpatient Rx  Name Route Sig Dispense Refill  . ALPRAZOLAM 1 MG PO TABS Oral Take 1 tablet (1 mg total) by mouth 3 (three) times daily as needed. 30 tablet 0  . ATENOLOL 50 MG PO TABS Oral Take 1 tablet (50 mg total) by mouth daily. 30 tablet 6  . CETIRIZINE HCL 10 MG PO TABS Oral Take 1 tablet (10 mg total) by mouth daily. 30 tablet 11  . FLUTICASONE PROPIONATE 50 MCG/ACT NA SUSP Nasal Place 1 spray into the nose daily. 16  g 3  . HYDROCHLOROTHIAZIDE 25 MG PO TABS Oral Take 1 tablet (25 mg total) by mouth daily. 30 tablet 6  . MULTIVITAMINS PO CAPS Oral Take 1 capsule by mouth daily.      Marland Kitchen OMEPRAZOLE 20 MG PO CPDR  1-2 tablets daily 60 capsule 6  . OXYCODONE-ACETAMINOPHEN 10-325 MG PO TABS Oral Take 1 tablet by mouth every 4 (four) hours as needed. For pain      BP 147/82  Pulse 62  Temp(Src) 98 F (36.7 C) (Oral)  Resp 19  SpO2 99%  Physical Exam  Nursing note and vitals reviewed. Constitutional: She appears well-developed and well-nourished. No distress.  HENT:  Head: Normocephalic and atraumatic.  Eyes: Pupils are equal, round, and reactive to light.  Neck: Normal range of motion. Neck supple.  Cardiovascular: Normal rate and regular rhythm.   Pulmonary/Chest: Effort normal. No respiratory distress. She has no wheezes.  Abdominal: Soft. Bowel sounds are normal. She exhibits no distension. There is tenderness (epigastric region). There is no rebound and no guarding.  Neurological: She is alert.  Skin: Skin is warm and dry.    ED Course  Procedures (including critical care time)  Labs Reviewed  BASIC METABOLIC PANEL - Abnormal; Notable for the following:    Glucose,  Bld 101 (*)    All other components within normal limits  CBC  TROPONIN I   Dg Chest 2 View  12/17/2011  *RADIOLOGY REPORT*  Clinical Data: Chest pain  CHEST - 2 VIEW  Comparison: 06/23/2008  Findings: Heart size is normal.  Mediastinal shadows are normal. The lungs are clear.  No effusions.  Ordinary degenerative changes effect the spine.  IMPRESSION: No active disease  Original Report Authenticated By: Thomasenia Sales, M.D.     1. GERD (gastroesophageal reflux disease)       MDM     Date: 12/17/2011  Rate: 65  Rhythm: normal sinus rhythm  QRS Axis: normal  Intervals: normal  ST/T Wave abnormalities: nonspecific T wave changes  Conduction Disutrbances:none  Narrative Interpretation:   Old EKG Reviewed: none  available   Pt given GI cocktail and Aspirin 324mg . She states that shortly after the GI cocktail she belched twice and felt complete resolution of her symptoms. Chest xray and lab work normal. Pt resting comfortably and pain never returned since belching,  I have discussed results with the patient who promises to go pick up her medications right after leaving the hospital.  Pt has been advised of the symptoms that warrant their return to the ED. Patient has voiced understanding and has agreed to follow-up with the PCP or specialist.        Dorthula Matas, PA 12/17/11 [phone]

## 2011-12-17 NOTE — ED Notes (Signed)
Epigastric pain that burns and goes thru to back; nausea associated with it. Pt has cardiac murmur and GI hx.

## 2011-12-22 ENCOUNTER — Emergency Department (HOSPITAL_COMMUNITY)
Admission: EM | Admit: 2011-12-22 | Discharge: 2011-12-22 | Disposition: A | Discharge status: Home / Self Care | Payer: Medicaid Other | Attending: Emergency Medicine | Admitting: Emergency Medicine

## 2011-12-22 ENCOUNTER — Other Ambulatory Visit: Payer: Self-pay | Admitting: Family Medicine

## 2011-12-22 ENCOUNTER — Encounter (HOSPITAL_COMMUNITY): Payer: Self-pay | Admitting: Emergency Medicine

## 2011-12-22 DIAGNOSIS — Z87891 Personal history of nicotine dependence: Secondary | ICD-10-CM | POA: Insufficient documentation

## 2011-12-22 DIAGNOSIS — M549 Dorsalgia, unspecified: Secondary | ICD-10-CM | POA: Insufficient documentation

## 2011-12-22 DIAGNOSIS — K219 Gastro-esophageal reflux disease without esophagitis: Secondary | ICD-10-CM | POA: Insufficient documentation

## 2011-12-22 MED ORDER — HYDROMORPHONE HCL PF 1 MG/ML IJ SOLN
1.0000 mg | Freq: Once | INTRAMUSCULAR | Status: AC
  Administered 2011-12-22: 1 mg via INTRAMUSCULAR
  Filled 2011-12-22: qty 1

## 2011-12-22 MED ORDER — DIAZEPAM 5 MG/ML IJ SOLN
5.0000 mg | Freq: Once | INTRAMUSCULAR | Status: AC
  Administered 2011-12-22: 5 mg via INTRAMUSCULAR
  Filled 2011-12-22: qty 2

## 2011-12-22 MED ORDER — HYDROMORPHONE HCL PF 1 MG/ML IJ SOLN
1.0000 mg | Freq: Once | INTRAMUSCULAR | Status: AC
Start: 2011-12-22 — End: 2011-12-22
  Administered 2011-12-22: 1 mg via INTRAMUSCULAR
  Filled 2011-12-22: qty 1

## 2011-12-22 MED ORDER — OXYCODONE-ACETAMINOPHEN 5-325 MG PO TABS: 1.0000 | ORAL_TABLET | ORAL | Status: AC | PRN

## 2011-12-22 MED ORDER — IBUPROFEN 200 MG PO TABS
400.0000 mg | ORAL_TABLET | Freq: Once | ORAL | Status: AC
  Administered 2011-12-22: 400 mg via ORAL
  Filled 2011-12-22 (×2): qty 1

## 2011-12-22 NOTE — ED Notes (Signed)
The pt has asked x 4 how long beofre the doctor will see her.

## 2011-12-22 NOTE — ED Notes (Signed)
The pt has had  Pain under her rt scapula since Thursday.  She has had c-spine surgery previously

## 2011-12-22 NOTE — ED Provider Notes (Signed)
History    50yF with back pain. Gradual onset about a day ago. Cant remember what was doing when first noticed. Gradually worsening. Worse with movement. Denies trauma. No fever or chills. No n/v. No rash. No numbness, tingling or loss of strength.  Denies use of blood thinning medications. No bladder or bowel incontinence or retention. No change in sensation when wipes.  CSN: [id number]  Arrival date & time 12/22/11  1601   First MD Initiated Contact with Patient 12/22/11 1842      Chief Complaint  Patient presents with  . Back Pain    (Consider location/radiation/quality/duration/timing/severity/associated sxs/prior treatment) HPI  Past Medical History  Diagnosis Date  . GERD (gastroesophageal reflux disease)     Past Surgical History  Procedure Date  . Cholecystectomy   . Back surgery   . Abdominal hysterectomy     History reviewed. No pertinent family history.  History  Substance Use Topics  . Smoking status: Former Smoker    Quit date: 09/16/1995  . Smokeless tobacco: Never Used  . Alcohol Use: Yes    OB History    Grav Para Term Preterm Abortions TAB SAB Ect Mult Living                  Review of Systems   Review of symptoms negative unless otherwise noted in HPI.   Allergies  Lisinopril; Moxifloxacin; and Sulfonamide derivatives  Home Medications   Current Outpatient Rx  Name Route Sig Dispense Refill  . ALPRAZOLAM 1 MG PO TABS Oral Take 1 mg by mouth 3 (three) times daily as needed. For anxiety    . ATENOLOL 50 MG PO TABS Oral Take 1 tablet (50 mg total) by mouth daily. 30 tablet 6  . CETIRIZINE HCL 10 MG PO TABS Oral Take 1 tablet (10 mg total) by mouth daily. 30 tablet 11  . FLUTICASONE PROPIONATE 50 MCG/ACT NA SUSP Nasal Place 1 spray into the nose daily. 16 g 3  . HYDROCHLOROTHIAZIDE 25 MG PO TABS Oral Take 1 tablet (25 mg total) by mouth daily. 30 tablet 6  . MULTIVITAMINS PO CAPS Oral Take 1 capsule by mouth daily.      Marland Kitchen OMEPRAZOLE 20 MG  PO CPDR Oral Take 20-40 mg by mouth daily. 1-2 tablets daily    . OXYCODONE-ACETAMINOPHEN 10-325 MG PO TABS Oral Take 1 tablet by mouth every 4 (four) hours as needed. For pain      BP 126/72  Temp(Src) 97.7 F (36.5 C) (Oral)  Resp 18  SpO2 95%  Physical Exam  Nursing note and vitals reviewed. Constitutional: She is oriented to person, place, and time.       Laying in bed. Nad. Obese.  HENT:  Head: Normocephalic and atraumatic.  Eyes: Conjunctivae are normal. Right eye exhibits no discharge. Left eye exhibits no discharge.  Neck: Normal range of motion. Neck supple.  Cardiovascular: Normal rate, regular rhythm and normal heart sounds.  Exam reveals no gallop and no friction rub.   No murmur heard. Pulmonary/Chest: Effort normal and breath sounds normal. No respiratory distress.  Abdominal: Soft. She exhibits no distension. There is no tenderness.  Musculoskeletal: She exhibits no edema and no tenderness.       No midline spinal tenderness. Mild tenderness in R scapular region and L lumbar region. Overlying skin grossly normal.  Neurological: She is alert and oriented to person, place, and time. No cranial nerve deficit. She exhibits normal muscle tone. Coordination normal.  Patellar reflexes 2+ b/l. Sensation intact to light touch.  Skin: Skin is warm and dry.  Psychiatric: She has a normal mood and affect. Her behavior is normal. Thought content normal.    ED Course  Procedures (including critical care time)  Labs Reviewed - No data to display No results found.   1. Back pain       MDM  50yF with back pain. Suspect musculoskeletal. No evidence of cord compression on exam. Plan symptomatic tx. Return precautions dicussed. outpt fu.        Raeford Razor, MD 12/26/11 1452

## 2011-12-22 NOTE — Telephone Encounter (Signed)
Patient is calling because she needs a refill for Alprazolam to [facility] on [address].  She said that they have sent the refill request several times.  If there is a problem she would like a call.

## 2011-12-22 NOTE — Discharge Instructions (Signed)
Back Exercises Back exercises help treat and prevent back injuries. The goal of back exercises is to increase the strength of your abdominal and back muscles and the flexibility of your back. These exercises should be started when you no longer have back pain. Back exercises include:  Pelvic Tilt. Lie on your back with your knees bent. Tilt your pelvis until the lower part of your back is against the floor. Hold this position 5 to 10 sec and repeat 5 to 10 times.   Knee to Chest. Pull first 1 knee up against your chest and hold for 20 to 30 seconds, repeat this with the other knee, and then both knees. This may be done with the other leg straight or bent, whichever feels better.   Sit-Ups or Curl-Ups. Bend your knees 90 degrees. Start with tilting your pelvis, and do a partial, slow sit-up, lifting your trunk only 30 to 45 degrees off the floor. Take at least 2 to 3 seconds for each sit-up. Do not do sit-ups with your knees out straight. If partial sit-ups are difficult, simply do the above but with only tightening your abdominal muscles and holding it as directed.   Hip-Lift. Lie on your back with your knees flexed 90 degrees. Push down with your feet and shoulders as you raise your hips a couple inches off the floor; hold for 10 seconds, repeat 5 to 10 times.   Back arches. Lie on your stomach, propping yourself up on bent elbows. Slowly press on your hands, causing an arch in your low back. Repeat 3 to 5 times. Any initial stiffness and discomfort should lessen with repetition over time.   Shoulder-Lifts. Lie face down with arms beside your body. Keep hips and torso pressed to floor as you slowly lift your head and shoulders off the floor.  Do not overdo your exercises, especially in the beginning. Exercises may cause you some mild back discomfort which lasts for a few minutes; however, if the pain is more severe, or lasts for more than 15 minutes, do not continue exercises until you see your  caregiver. Improvement with exercise therapy for back problems is slow.   See your caregivers for assistance with developing a proper back exercise program. Document Released: 10/09/2004 Document Revised: 08/21/2011 Document Reviewed: 09/01/2005 [employer] Patient Information 2012 [employer].Back Pain, Adult Back pain is very common. The pain often gets better over time. The cause of back pain is usually not dangerous. Most people can learn to manage their back pain on their own.   HOME CARE    Stay active. Start with short walks on flat ground if you can. Try to walk farther each day.   Do not sit, drive, or stand in one place for more than 30 minutes. Do not stay in bed.   Do not avoid exercise or work. Activity can help your back heal faster.   Be careful when you bend or lift an object. Bend at your knees, keep the object close to you, and do not twist.   Sleep on a firm mattress. Lie on your side, and bend your knees. If you lie on your back, put a pillow under your knees.   Only take medicines as told by your doctor.   Put ice on the injured area.   Put ice in a plastic bag.   Place a towel between your skin and the bag.   Leave the ice on for 15 to 20 minutes, 3 to 4 times a day   2 to 3 days. After that, you can switch between ice and heat packs.   Ask your doctor about back exercises or massage.   Avoid feeling anxious or stressed. Find good ways to deal with stress, such as exercise.  GET HELP RIGHT AWAY IF:   Your pain does not go away with rest or medicine.   Your pain does not go away in 1 week.   You have new problems.   You do not feel well.   The pain spreads into your legs.   You cannot control when you poop (bowel movement) or pee (urinate).   Your arms or legs feel weak or lose feeling (numbness).   You feel sick to your stomach (nauseous) or throw up (vomit).   You have belly (abdominal) pain.   You feel like you may pass out (faint).    MAKE SURE YOU:   Understand these instructions.   Will watch your condition.   Will get help right away if you are not doing well or get worse.  Document Released: 02/18/2008 Document Revised: 08/21/2011 Document Reviewed: 01/20/2011 [facility] Patient Information 2012 [place], [place].  RESOURCE GUIDE  Dental Problems  Patients with Medicaid: [facility] [phone] [address]                                           [phone] [address] Phone:  [phone]                                                  Phone:  [phone]  If unable to pay or uninsured, contact:  [facility] or [facility]. to become qualified for the adult dental clinic.  Chronic Pain Problems Contact [facility]  [phone] Patients need to be referred by their primary care doctor.  Insufficient Money for Medicine Contact [employer]:  call "211" or [facility] [phone].  No Primary Care Doctor Call [facility]  [phone] Other agencies that provide inexpensive medical care    [facility]  [phone]    [facility]  [phone]    [facility]  [phone]    [facility]  [phone]    [facility]  [phone]    [facility]  [phone]  Psychological Services [facility]  [phone] [facility] Services  [phone] [facility]   2492584703 (emergency services 9140120805)  Substance Abuse Resources [facility]  [phone] [facility] [phone] [facility] [place] [phone] Floydene Flock 9394691515 Residential & Outpatient Substance Abuse Program  3163582465  Abuse/Neglect [facility] Child Abuse Hotline [phone]) 262-7553 [facility] Child Abuse Hotline (619) 037-7138 (After Hours)  Emergency Shelter [facility] 907-299-4629  Maternity Homes [facility] at the [place] [facility]  951-211-2188 [facility] [phone]) [phone]  MRSA Hotline #:   562-089-7485    [facility] [place]  [facility] Dept. [address]      [place] Phone:  [phone]                                   Phone:  [phone]                 Phone:  [place] Phone:  [place] [phone] (After Hours)

## 2011-12-22 NOTE — ED Notes (Addendum)
Pt reports back pain starting yesterday, worse today, can't lay down, pain with any movement radiating to different places. Sts she's not doing anything and the pain will just come on. Sts pain around the scapula and going down causing extreme pain and difficulty breathing. Sts had a cough a couple weeks ago that kind of cleared up and then came back

## 2011-12-22 NOTE — Telephone Encounter (Signed)
I have not seen this patient since September. She needed a follow-up in December, and according to her same day visit appointment with Dr. Durene Cal, he had asked her to make an appointment with me as well.  I understand she would like a refill, but I did not receive a refill request that I know of, and she really needs an office visit to have this refilled.  Please let her know to schedule an appointment with me. Thank you! Oluwanifemi Petitti M. Russ Looper, M.D.

## 2011-12-23 NOTE — Telephone Encounter (Signed)
I spoke with the patient and she did schedule to be seen on 4/19.  When she last came in, she saw Dr. Durene Cal because you were not available, and at that appt, she was told that for med refills, she would have to see you.  She is hoping for enough medication to last until her appt.

## 2011-12-24 MED ORDER — ALPRAZOLAM 1 MG PO TABS: 1.0000 mg | ORAL_TABLET | Freq: Three times a day (TID) | ORAL | Status: DC | PRN

## 2011-12-24 NOTE — Telephone Encounter (Signed)
Rx will be available for pick up this afternoon. Must keep next appointment for additional refill.  Please let patient know she can pick it up after 2:00pm at front desk.  Thanks! Estefania Kamiya M. Charistopher Rumble, M.D.

## 2012-01-02 ENCOUNTER — Ambulatory Visit (INDEPENDENT_AMBULATORY_CARE_PROVIDER_SITE_OTHER): Payer: Medicaid Other | Admitting: Family Medicine

## 2012-01-02 ENCOUNTER — Encounter: Payer: Self-pay | Admitting: Family Medicine

## 2012-01-02 VITALS — BP 123/70 | HR 76 | Ht 66.0 in | Wt 225.5 lb

## 2012-01-02 DIAGNOSIS — I10 Essential (primary) hypertension: Secondary | ICD-10-CM

## 2012-01-02 DIAGNOSIS — Z9889 Other specified postprocedural states: Secondary | ICD-10-CM

## 2012-01-02 DIAGNOSIS — J309 Allergic rhinitis, unspecified: Secondary | ICD-10-CM

## 2012-01-02 DIAGNOSIS — F411 Generalized anxiety disorder: Secondary | ICD-10-CM

## 2012-01-02 MED ORDER — OMEPRAZOLE 20 MG PO CPDR: 20.0000 mg | DELAYED_RELEASE_CAPSULE | Freq: Every day | ORAL | Status: DC

## 2012-01-02 MED ORDER — ALPRAZOLAM 1 MG PO TABS: 1.0000 mg | ORAL_TABLET | Freq: Every evening | ORAL | Status: DC | PRN

## 2012-01-02 MED ORDER — ALPRAZOLAM 1 MG PO TABS
1.0000 mg | ORAL_TABLET | Freq: Two times a day (BID) | ORAL | Status: DC | PRN
Start: 2012-01-02 — End: 2012-05-20

## 2012-01-02 MED ORDER — HYDROCHLOROTHIAZIDE 25 MG PO TABS: 25.0000 mg | ORAL_TABLET | Freq: Every day | ORAL | Status: DC

## 2012-01-02 MED ORDER — ATENOLOL 50 MG PO TABS: 50.0000 mg | ORAL_TABLET | Freq: Every day | ORAL | Status: DC

## 2012-01-02 MED ORDER — CETIRIZINE HCL 10 MG PO TABS: 10.0000 mg | ORAL_TABLET | Freq: Every day | ORAL | Status: DC

## 2012-01-02 MED ORDER — ALPRAZOLAM 1 MG PO TABS: 1.0000 mg | ORAL_TABLET | Freq: Three times a day (TID) | ORAL | Status: DC | PRN

## 2012-01-02 MED ORDER — FLUTICASONE PROPIONATE 50 MCG/ACT NA SUSP: 1.0000 | Freq: Every day | NASAL | Status: DC

## 2012-01-02 NOTE — Progress Notes (Signed)
Subjective:     Patient ID: Mariah [name], female   DOB: [birth year], 51 y.o.   MRN: [id number]  HPI Patient is a 51 yo F presenting for follow up appointment today for her chronic medical problems. Overall, patient is doing very well. She had shoulder surgery 2 days ago which she has been healing well from. Her orthopedist is following her and prescribing narcotics as needed for pain. Patient's blood pressure has been stable. She was seen by Dr. Durene Cal recently and has been doing well. She comes in today for refills on her medications. Patient was asked to see me as her PCP for these refills. Otherwise, she has no complaints. Denies headaches, dizziness, chest pain, abdominal pain or dysuria. No pain in shoulder at this time while in immobilizer. Good ROM and sensation of fingers on right side.  Review of Systems  Constitutional: Negative for fever and chills.  HENT: Negative for congestion.   Eyes: Negative for visual disturbance.  Respiratory: Negative for cough and shortness of breath.   Cardiovascular: Negative for chest pain and leg swelling.  Gastrointestinal: Negative for abdominal pain.  Genitourinary: Negative for dysuria.  Musculoskeletal: Negative for myalgias and arthralgias.  Skin: Negative for rash.  Neurological: Negative for headaches.       Objective:   BP 123/70  Pulse 76  Ht 5\' 6"  (1.676 m)  Wt 225 lb 8 oz (102.286 kg)  BMI 36.40 kg/m2 Physical Exam  Constitutional: She is oriented to person, place, and time. She appears well-developed and well-nourished. No distress.  HENT:  Head: Atraumatic.  Neck: Normal range of motion.  Cardiovascular: Normal rate, regular rhythm and normal heart sounds.   Pulmonary/Chest: Effort normal and breath sounds normal.  Abdominal: Soft.       Obese  Musculoskeletal: She exhibits no edema.       Right shoulder in immobilizer sling. No signs of erythema or edema. Good ROM of fingers.  Neurological: She is alert and oriented to  person, place, and time.       Assessment:     51 yo F presenting for follow up of chronic medical conditions    Plan:

## 2012-01-02 NOTE — Patient Instructions (Signed)
It was so good to see you today! I am glad your shoulder is doing well. I have called all of your prescriptions in for 6 months.  Also, I have given you 3 prescriptions for your Xanax to get you until your next appointment with me.  If you need anything, please let me know!  Take care! Jetson Pickrel M. Rodneshia Greenhouse, M.D.

## 2012-01-03 DIAGNOSIS — Z9889 Other specified postprocedural states: Secondary | ICD-10-CM | POA: Insufficient documentation

## 2012-01-03 NOTE — Assessment & Plan Note (Signed)
Patient's BP very well controlled. Will give refills for 6 months. Follow up with me in 3 months.

## 2012-01-03 NOTE — Assessment & Plan Note (Signed)
>>  ASSESSMENT AND PLAN FOR HYPERTENSION, BENIGN WRITTEN ON 01/03/2012 10:33 AM BY HAIRFORD, AMBER M, MD  Patient's BP very well controlled. Will give refills for 6 months. Follow up with me in 3 months.

## 2012-01-03 NOTE — Assessment & Plan Note (Signed)
Patient continues to have some anxiety. Has been on Xanax chronically and requesting refills. Discussed ways to help decrease her anxiety. Recent shoulder surgery seems to make it worse. Refilled Xanax x1 month with 2 future prescriptions as well. Will further address this at next appointment and consider starting SSRI for anxiety.

## 2012-01-03 NOTE — Assessment & Plan Note (Signed)
Patient doing extremely well 2 days post op. Still in immobilizer. Dressings intact. I anticipate patient will continue to do well. She will follow up with ortho as scheduled

## 2012-01-06 ENCOUNTER — Ambulatory Visit: Payer: Medicaid Other | Admitting: Physical Therapy

## 2012-01-07 ENCOUNTER — Ambulatory Visit: Payer: Medicaid Other | Attending: Orthopedic Surgery | Admitting: Physical Therapy

## 2012-01-07 DIAGNOSIS — IMO0001 Reserved for inherently not codable concepts without codable children: Secondary | ICD-10-CM | POA: Insufficient documentation

## 2012-01-07 DIAGNOSIS — M25519 Pain in unspecified shoulder: Secondary | ICD-10-CM | POA: Insufficient documentation

## 2012-01-15 ENCOUNTER — Ambulatory Visit: Payer: Medicaid Other | Admitting: Physical Therapy

## 2012-01-22 ENCOUNTER — Ambulatory Visit: Payer: Medicaid Other | Attending: Orthopedic Surgery | Admitting: Physical Therapy

## 2012-01-22 DIAGNOSIS — IMO0001 Reserved for inherently not codable concepts without codable children: Secondary | ICD-10-CM | POA: Insufficient documentation

## 2012-01-22 DIAGNOSIS — M25519 Pain in unspecified shoulder: Secondary | ICD-10-CM | POA: Insufficient documentation

## 2012-01-29 ENCOUNTER — Encounter: Payer: Medicaid Other | Admitting: Physical Therapy

## 2012-02-05 ENCOUNTER — Ambulatory Visit: Payer: Medicaid Other | Admitting: Physical Therapy

## 2012-02-18 ENCOUNTER — Encounter: Payer: Medicaid Other | Admitting: Physical Therapy

## 2012-02-23 ENCOUNTER — Ambulatory Visit: Payer: Medicaid Other | Attending: Orthopedic Surgery | Admitting: Physical Therapy

## 2012-02-23 DIAGNOSIS — IMO0001 Reserved for inherently not codable concepts without codable children: Secondary | ICD-10-CM | POA: Insufficient documentation

## 2012-02-23 DIAGNOSIS — M25519 Pain in unspecified shoulder: Secondary | ICD-10-CM | POA: Insufficient documentation

## 2012-03-04 ENCOUNTER — Ambulatory Visit: Payer: Medicaid Other | Admitting: Family Medicine

## 2012-05-18 ENCOUNTER — Telehealth: Payer: Self-pay | Admitting: Family Medicine

## 2012-05-18 NOTE — Telephone Encounter (Signed)
Patient is calling to find out who the Dermatology Referral was to.  She needs to know the name of the doctor and the phone number.

## 2012-05-19 NOTE — Telephone Encounter (Signed)
Pt has appt 9.5.13. [name], Mariah [name]

## 2012-05-20 ENCOUNTER — Encounter: Payer: Self-pay | Admitting: Family Medicine

## 2012-05-20 ENCOUNTER — Ambulatory Visit (INDEPENDENT_AMBULATORY_CARE_PROVIDER_SITE_OTHER): Payer: Medicaid Other | Admitting: Family Medicine

## 2012-05-20 ENCOUNTER — Telehealth: Payer: Self-pay | Admitting: *Deleted

## 2012-05-20 VITALS — BP 149/83 | HR 90 | Temp 97.9°F | Ht 66.0 in | Wt 229.0 lb

## 2012-05-20 DIAGNOSIS — Z9889 Other specified postprocedural states: Secondary | ICD-10-CM

## 2012-05-20 DIAGNOSIS — I10 Essential (primary) hypertension: Secondary | ICD-10-CM

## 2012-05-20 DIAGNOSIS — G4700 Insomnia, unspecified: Secondary | ICD-10-CM

## 2012-05-20 MED ORDER — ALPRAZOLAM 1 MG PO TABS: 1.0000 mg | ORAL_TABLET | Freq: Every evening | ORAL | Status: DC | PRN

## 2012-05-20 MED ORDER — TRAZODONE HCL 50 MG PO TABS: 25.0000 mg | ORAL_TABLET | Freq: Every day | ORAL | Status: DC

## 2012-05-20 MED ORDER — ALPRAZOLAM 1 MG PO TABS: 1.0000 mg | ORAL_TABLET | Freq: Two times a day (BID) | ORAL | Status: DC | PRN

## 2012-05-20 MED ORDER — ALPRAZOLAM 1 MG PO TABS: 1.0000 mg | ORAL_TABLET | Freq: Three times a day (TID) | ORAL | Status: DC | PRN

## 2012-05-20 NOTE — Progress Notes (Signed)
Subjective:     Patient ID: Mariah [name], female   DOB: [birth year], 51 y.o.   MRN: [id number]  HPI Patient presents for a follow-up. Not sleeping well.  1. Insomnia- Takes Xanax at night. Goes to bed at 9, wakes up at midnight and unable to fall asleep again. Falls asleep again around 4am. Does not drink caffeine. Does not watch TV or read before bed. Her biggest concern is not being able to stay asleep. Denies increased urination, depression or any changes in schedule.   2. Shoulder pain- Followed by ortho s/p surgery. Reports some swelling of shoulders bilaterally. No difficulty moving arms. Does take pain medication some. No fevers, chills, redness of joint.   3. HTN- BP improved today. NO headaches, changes in vision, CP, abd pain or leg swelling. Takes all medications as prescribed.  History reviewed: Nonsmoker  Review of Systems See HPI above    Objective:   Physical Exam Gen: Awake, alert, talkative. NAD HEENT: AT, [place] Heart: RRR Lungs: CTAB Ext: some fullness around neck, but no tenderness or redness. Able to move arms with little difficulty. No leg edema Neuro: Grossly intact    Assessment:     51 yo F follow up    Plan:     See problem list

## 2012-05-20 NOTE — Telephone Encounter (Signed)
Appt with Dr. Donzetta Starch for 06/08/12 @ 2pm.  Pt informed while in clinic. Shirl Ludington, Maryjo Rochester

## 2012-05-20 NOTE — Patient Instructions (Signed)
It was good to see you toady! Hug that sweet baby Tania for me!  For sleeping, remember good sleep hygiene helps. Start with the Trazodone 1/2 tab every night. Continue Xanax during the day only. Let me know that does not help.  I have refilled your Xanax. Let me know if you need any other refills!  Amber M. Hairford, M.D.

## 2012-05-21 NOTE — Assessment & Plan Note (Signed)
Neck fullness noted but no shoulder pain. Will continue to monitor but no acute intervention.

## 2012-05-21 NOTE — Assessment & Plan Note (Signed)
>>  ASSESSMENT AND PLAN FOR HYPERTENSION, BENIGN WRITTEN ON 05/21/2012 12:29 PM BY HAIRFORD, AMBER M, MD  Stable. Continue Atenolol and HCTZ. Will need labs at next visit. F/u in 3 months

## 2012-05-21 NOTE — Assessment & Plan Note (Signed)
Patient has been taking Xanax. Will prescribe Trazodone 25mg  qhs for sleep. She should only take Xanax during the day, not in addition to Trazodone. Hopefully this will last longer. Also, we discussed sleep hygiene and she will make some modifications. If needed, she can increase Trazodone to 50mg  qhs in a few weeks.

## 2012-05-21 NOTE — Assessment & Plan Note (Signed)
Stable. Continue Atenolol and HCTZ. Will need labs at next visit. F/u in 3 months

## 2012-06-07 ENCOUNTER — Ambulatory Visit (INDEPENDENT_AMBULATORY_CARE_PROVIDER_SITE_OTHER): Payer: Medicaid Other | Admitting: Family Medicine

## 2012-06-07 ENCOUNTER — Encounter: Payer: Self-pay | Admitting: Family Medicine

## 2012-06-07 VITALS — BP 152/74 | Temp 98.1°F | Wt 222.0 lb

## 2012-06-07 DIAGNOSIS — G4700 Insomnia, unspecified: Secondary | ICD-10-CM

## 2012-06-07 NOTE — Progress Notes (Signed)
Subjective:     Patient ID: Mariah [name], female   DOB: [birth year], 51 y.o.   MRN: [id number]  HPI Patient returned to clinic today for insomnia, which she has been experiencing since mid- August. She was seen at the clinic a few weeks ago and prescribed trazodone, which she admits she only took once because she was concerned about the medication side effects. We spoke indepthly about possible stressors, sleep hygiene, exercise, caffeine, medications, bed changes and work habits that could be affecting her normal sleep pattern. She admits to drinking ice tea later in the day to evening. She also admitted to getting her schedule altered by taking naps back in early August and with reoperation of her shoulder surgery months prior. She has not been eating as well and has been forgetting to eat; she has had an unintentional 7 pound weight loss in a few weeks.   After some talking she discovered she has been more stressed about the ending of her 4 year relationship with her significant other, which ended last April, than she realized. She reports it was not a good break up and they no longer even speak. He left her when she was having surgery and she had to take on the healing phase and recoperation on her own. Now she is having to deal with more financial situations she did not have to deal with prior and stress concerning dealing with everyday chores of the household that he use to take care of, such as the lawn and gutters.   Review of Systems See above HPI    Objective:   Physical Exam  Psychiatric: She has a normal mood and affect. Her speech is normal and behavior is normal. Judgment normal. Cognition and memory are normal.  She does not seem depressed. Gen: Extremely pleasant female. NAD. Happy personality. Does not appear overly tired. Dressed nicely. Concerned.  Heart: RRR. No murmur Lungs: CTAB

## 2012-06-07 NOTE — Assessment & Plan Note (Addendum)
-   Take xanax 2 times a day as scheduled for morning and afternoon for stress - Take trazodone 45 minutes prior to bedtime (9pm) - Only lay down when it is time for bed >9pm, no naps. - Do not drink ice tea after 1 pm (or other forms of caffeine: coffee, soda, chocolate) - Do not turn on TV in bedroom - Have blinds closed in bedroom - Eat at least two square meals a day - Keep this regimen for at least 2 weeks after schedule regulates - F/U: if symptoms remain after October, will consider cognitive behavior therapy at that time.

## 2012-06-07 NOTE — Patient Instructions (Addendum)

## 2012-07-28 ENCOUNTER — Emergency Department (HOSPITAL_COMMUNITY)
Admission: EM | Admit: 2012-07-28 | Discharge: 2012-07-28 | Disposition: A | Discharge status: Home / Self Care | Payer: Medicaid Other | Source: Home / Self Care | Attending: Emergency Medicine | Admitting: Emergency Medicine

## 2012-07-28 ENCOUNTER — Encounter (HOSPITAL_COMMUNITY): Payer: Self-pay

## 2012-07-28 DIAGNOSIS — H1130 Conjunctival hemorrhage, unspecified eye: Secondary | ICD-10-CM

## 2012-07-28 DIAGNOSIS — J310 Chronic rhinitis: Secondary | ICD-10-CM

## 2012-07-28 NOTE — ED Notes (Signed)
C/o blood shot left eye, daughter came over and noticed her eye was red today, she states that Sunday she did have a headache located on the top of her head and also felt as though she could feel the left side of her neck "pulsating" states that she noticed that the area near her neck was slightly swollen

## 2012-07-28 NOTE — ED Provider Notes (Signed)
History     CSN: [id number]  Arrival date & time 07/28/12  1304   First MD Initiated Contact with Patient 07/28/12 1310      Chief Complaint  Patient presents with  . Eye Problem    (Consider location/radiation/quality/duration/timing/severity/associated sxs/prior treatment) HPI Comments: Patient presents to urgent care this afternoon complaining that her right eye was redder today she states that Sunday she was having a headache on the left side of her head and her neck was hurting and pulsating. (Patient points to posterior neck region to the trapezium region). She describes that usually in the morning she does cough a lot and she's been having a congested runny nose and postnasal drainage. She uses Zyrtec for allergies and occasional Flonase as prescribed by her primary care doctor.  Her daughter came over today and with some what concerned that she had some bleeding of her right eye was concern about her having a stroke so decided to bring her in to be checked. Patient denies having any headaches at this point denied any numbness tingling or weakness to any of her upper or lower extremities, denies any speech problems, facial expression changes or speech difficulties. During the last few days. Patient denies any chest pains, shortness of breath or palpitations. Describes some soreness to her right upper eyelid area. "But it doesn't really hurt". She denies any trauma to her eyes she can recall. At this point the left side of her neck is not bothering her as much as Sunday where some of her relatives were massaging her neck area.  Patient is a 51 y.o. female presenting with eye problem. The history is provided by the patient.  Eye Problem  This is a new problem. The current episode started 2 days ago. The problem occurs constantly. The problem has not changed since onset.The right eye is affected.The pain is mild. There is no history of trauma to the eye. There is no known exposure to pink  eye. She does not wear contacts. Associated symptoms include eye redness. Pertinent negatives include no numbness, no blurred vision, no decreased vision, no discharge, no double vision, no foreign body sensation, no photophobia, no nausea, no vomiting, no tingling, no weakness and no itching.    Past Medical History  Diagnosis Date  . GERD (gastroesophageal reflux disease)     Past Surgical History  Procedure Date  . Cholecystectomy   . Back surgery   . Abdominal hysterectomy     History reviewed. No pertinent family history.  History  Substance Use Topics  . Smoking status: Former Smoker    Quit date: 09/16/1995  . Smokeless tobacco: Never Used  . Alcohol Use: Yes    OB History    Grav Para Term Preterm Abortions TAB SAB Ect Mult Living                  Review of Systems  Constitutional: Negative for fever, diaphoresis, appetite change and fatigue.  HENT: Positive for neck pain and neck stiffness. Negative for hearing loss, ear pain, congestion and rhinorrhea.   Eyes: Positive for redness. Negative for blurred vision, double vision, photophobia, pain, discharge, itching and visual disturbance.  Respiratory: Negative for shortness of breath.   Cardiovascular: Negative for chest pain, palpitations and leg swelling.  Gastrointestinal: Negative for nausea and vomiting.  Skin: Negative for itching.  Neurological: Positive for headaches. Negative for dizziness, tingling, tremors, speech difficulty, weakness and numbness.    Allergies  Lisinopril; Moxifloxacin; and Sulfonamide derivatives  Home Medications   Current Outpatient Rx  Name  Route  Sig  Dispense  Refill  . ALPRAZOLAM 1 MG PO TABS   Oral   Take 1 tablet (1 mg total) by mouth 3 (three) times daily as needed. For anxiety   30 tablet   0   . ATENOLOL 50 MG PO TABS   Oral   Take 1 tablet (50 mg total) by mouth daily.   30 tablet   5   . CETIRIZINE HCL 10 MG PO TABS   Oral   Take 1 tablet (10 mg total)  by mouth daily.   30 tablet   11   . FLUTICASONE PROPIONATE 50 MCG/ACT NA SUSP   Nasal   Place 1 spray into the nose daily.   16 g   5   . HYDROCHLOROTHIAZIDE 25 MG PO TABS   Oral   Take 1 tablet (25 mg total) by mouth daily.   30 tablet   5   . MULTIVITAMINS PO CAPS   Oral   Take 1 capsule by mouth daily.           Marland Kitchen OMEPRAZOLE 20 MG PO CPDR   Oral   Take 1-2 capsules (20-40 mg total) by mouth daily. 1-2 tablets daily   30 capsule   5   . OXYCODONE-ACETAMINOPHEN 10-325 MG PO TABS   Oral   Take 1 tablet by mouth every 4 (four) hours as needed. For pain         . TRAZODONE HCL 50 MG PO TABS   Oral   Take 0.5 tablets (25 mg total) by mouth at bedtime.   30 tablet   2     BP 131/75  Pulse 63  Temp 98.3 F (36.8 C) (Oral)  Resp 18  SpO2 95%  Physical Exam  Nursing note and vitals reviewed. Constitutional: She is oriented to person, place, and time. She appears well-developed and well-nourished.  HENT:  Head: Normocephalic and atraumatic.  Eyes: EOM and lids are normal. Pupils are equal, round, and reactive to light. No foreign bodies found. Right conjunctiva has a hemorrhage. No scleral icterus.    Neck: Neck supple. No JVD present. No tracheal deviation present. No thyromegaly present.    Cardiovascular: Normal rate, regular rhythm, normal heart sounds and normal pulses.  Exam reveals no gallop and no friction rub.   No murmur heard. Pulses:      Carotid pulses are 2+ on the right side, and 2+ on the left side. Pulmonary/Chest: Effort normal and breath sounds normal.  Musculoskeletal: She exhibits tenderness.  Lymphadenopathy:    She has no cervical adenopathy.  Neurological: She is alert and oriented to person, place, and time. No cranial nerve deficit. She exhibits normal muscle tone. Coordination normal.  Skin: No rash noted. No erythema.    ED Course  Procedures (including critical care time)  Labs Reviewed - No data to display No results  found.   1. Subconjunctival hemorrhage   2. Rhinitis      Handheld device will monitor her cardiac activity for 60 seconds with no observable arrhythmias. Patient with a normal sinus rhythm and a ventricular rate of 63 beats per minute no abnormal ST or T. changes. MDM   Patient most likely with a right subconjunctival hemorrhage on her right conjunctiva is results of a coughing spell she admits to frequently. Patient did not have any neurological symptoms at time of exam and her exam neck pain or discomfort was  most consistent with a muscular sprain/strain to her trapezium region. We discussed at length after a physical exam what symptoms should warrant further evaluation in the emergency department if any relative ourselves become concerned about potential stroke like symptoms. They have knowledge this instructions, degrees with treatment plan and assessment and followup care as necessary.    Jimmie Molly, MD 07/28/12 1536

## 2012-08-01 ENCOUNTER — Inpatient Hospital Stay (HOSPITAL_COMMUNITY)
Admission: AD | Admit: 2012-08-01 | Discharge: 2012-08-01 | Disposition: A | Discharge status: Home / Self Care | Payer: Medicaid Other | Source: Ambulatory Visit | Attending: Obstetrics and Gynecology | Admitting: Obstetrics and Gynecology

## 2012-08-01 ENCOUNTER — Encounter (HOSPITAL_COMMUNITY): Payer: Self-pay | Admitting: *Deleted

## 2012-08-01 DIAGNOSIS — N898 Other specified noninflammatory disorders of vagina: Secondary | ICD-10-CM

## 2012-08-01 DIAGNOSIS — N949 Unspecified condition associated with female genital organs and menstrual cycle: Secondary | ICD-10-CM | POA: Insufficient documentation

## 2012-08-01 DIAGNOSIS — N899 Noninflammatory disorder of vagina, unspecified: Secondary | ICD-10-CM

## 2012-08-01 DIAGNOSIS — IMO0002 Reserved for concepts with insufficient information to code with codable children: Secondary | ICD-10-CM | POA: Insufficient documentation

## 2012-08-01 LAB — URINALYSIS, ROUTINE W REFLEX MICROSCOPIC
Bilirubin Urine: NEGATIVE
Glucose, UA: NEGATIVE mg/dL
Ketones, ur: NEGATIVE mg/dL
Leukocytes, UA: NEGATIVE
Nitrite: NEGATIVE
Protein, ur: NEGATIVE mg/dL
Specific Gravity, Urine: >1.03 — ABNORMAL HIGH (ref 1.005–1.030)
Urobilinogen, UA: 0.2 mg/dL (ref 0.0–1.0)
pH: 6 (ref 5.0–8.0)

## 2012-08-01 LAB — URINE MICROSCOPIC-ADD ON

## 2012-08-01 NOTE — MAU Provider Note (Signed)
History     CSN: [id number]  Arrival date and time: 08/01/12 1931   First Provider Initiated Contact with Patient 08/01/12 1953      Chief Complaint  Patient presents with  . Vaginal Injury   HPI Mariah [name] is a 51 y.o. female who presents to MAU with vaginal irritation. The irritation started last night after intercourse. Patient thinks she may have a condom still in vaginal. Tried to get it out and scratched outside of vaginal area causing it to bleed. Thinks she still has condom inside and here to be evaluated.  OB History    Grav Para Term Preterm Abortions TAB SAB Ect Mult Living                  Past Medical History  Diagnosis Date  . GERD (gastroesophageal reflux disease)     Past Surgical History  Procedure Date  . Cholecystectomy   . Back surgery   . Abdominal hysterectomy     No family history on file.  History  Substance Use Topics  . Smoking status: Former Smoker    Quit date: 09/16/1995  . Smokeless tobacco: Never Used  . Alcohol Use: Yes    Allergies:  Allergies  Allergen Reactions  . Lisinopril     REACTION: COUGH  . Moxifloxacin     REACTION: N \\T \ V  . Sulfonamide Derivatives Other (See Comments)    Prescriptions prior to admission  Medication Sig Dispense Refill  . ALPRAZolam (XANAX) 1 MG tablet Take 1 tablet (1 mg total) by mouth 3 (three) times daily as needed. For anxiety  30 tablet  0  . atenolol (TENORMIN) 50 MG tablet Take 1 tablet (50 mg total) by mouth daily.  30 tablet  5  . cetirizine (ZYRTEC ALLERGY) 10 MG tablet Take 1 tablet (10 mg total) by mouth daily.  30 tablet  11  . fluticasone (FLONASE) 50 MCG/ACT nasal spray Place 1 spray into the nose daily.  16 g  5  . hydrochlorothiazide (HYDRODIURIL) 25 MG tablet Take 1 tablet (25 mg total) by mouth daily.  30 tablet  5  . Multiple Vitamin (MULTIVITAMIN) capsule Take 1 capsule by mouth daily.        Marland Kitchen omeprazole (PRILOSEC) 20 MG capsule Take 1-2 capsules (20-40 mg total)  by mouth daily. 1-2 tablets daily  30 capsule  5  . oxyCODONE-acetaminophen (PERCOCET) 10-325 MG per tablet Take 1 tablet by mouth every 4 (four) hours as needed. For pain      . traZODone (DESYREL) 50 MG tablet Take 0.5 tablets (25 mg total) by mouth at bedtime.  30 tablet  2    Review of Systems  Constitutional: Negative for fever and chills.  Eyes: Negative for blurred vision.  Respiratory: Negative for cough and wheezing.   Cardiovascular: Negative for chest pain and palpitations.  Gastrointestinal: Negative for nausea and abdominal pain.  Genitourinary: Negative for dysuria, urgency and frequency.       Vaginal irritation. ? Foreign body.  Musculoskeletal: Negative for back pain.  Neurological: Negative for dizziness and headaches.  Psychiatric/Behavioral: Negative for depression. The patient is not nervous/anxious.    Physical Exam   Blood pressure 126/64, pulse 65, temperature 98 F (36.7 C), temperature source Oral, resp. rate 18, height 5' 5.5" (1.664 m), weight 223 lb 6.4 oz (101.334 kg), SpO2 100.00%.  Physical Exam  Nursing note and vitals reviewed. Constitutional: She is oriented to person, place, and time. She appears well-developed and  well-nourished. No distress.  HENT:  Head: Normocephalic and atraumatic.  Eyes: EOM are normal.  Neck: Neck supple.  Cardiovascular: Normal rate.   Respiratory: Effort normal.  GI: Soft. There is no tenderness.  Genitourinary:       External genitalia with abrasions noted mucous membranes bilateral labia major and urethra. No foreign body visualized or palpated.  Musculoskeletal: Normal range of motion.  Neurological: She is alert and oriented to person, place, and time.  Skin: Skin is warm and dry.  Psychiatric: She has a normal mood and affect. Her behavior is normal. Judgment and thought content normal.   Assessment: 51 y.o. female with vaginal irritation   Abrasions of external genitalia  Plan:  Bacitracin ointment   Follow  up with PCP, return as needed Discussed with the patient and all questioned fully answered. She will return if any problems arise.   Medication List     As of 08/03/2012  7:20 AM    CONTINUE taking these medications         ALPRAZolam 1 MG tablet   Commonly known as: XANAX      atenolol 50 MG tablet   Commonly known as: TENORMIN   Take 1 tablet (50 mg total) by mouth daily.      cetirizine 10 MG tablet   Commonly known as: ZYRTEC   Take 1 tablet (10 mg total) by mouth daily.      fluticasone 50 MCG/ACT nasal spray   Commonly known as: FLONASE   Place 1 spray into the nose daily.      hydrochlorothiazide 25 MG tablet   Commonly known as: HYDRODIURIL   Take 1 tablet (25 mg total) by mouth daily.      multivitamin capsule      oxyCODONE-acetaminophen 10-325 MG per tablet   Commonly known as: PERCOCET         MAU Course  Procedures   NEESE,HOPE, RN, FNP,BC 08/01/2012, 7:53 PM

## 2012-08-01 NOTE — MAU Note (Signed)
Pt states believes a condom is left inside her from having intercourse on Saturday. Denies pain at this time. Had some vaginal spotting after trying to feel for the condom. Denies vaginal discharge.

## 2012-08-02 ENCOUNTER — Ambulatory Visit (INDEPENDENT_AMBULATORY_CARE_PROVIDER_SITE_OTHER): Payer: Medicaid Other | Admitting: Family Medicine

## 2012-08-02 ENCOUNTER — Encounter: Payer: Self-pay | Admitting: Family Medicine

## 2012-08-02 VITALS — BP 124/80 | HR 65 | Temp 98.2°F | Ht 65.5 in | Wt 222.0 lb

## 2012-08-02 DIAGNOSIS — M25562 Pain in left knee: Secondary | ICD-10-CM | POA: Insufficient documentation

## 2012-08-02 DIAGNOSIS — M25569 Pain in unspecified knee: Secondary | ICD-10-CM

## 2012-08-02 MED ORDER — OMEPRAZOLE 20 MG PO CPDR: 20.0000 mg | DELAYED_RELEASE_CAPSULE | Freq: Every day | ORAL | Status: DC

## 2012-08-02 MED ORDER — MELOXICAM 15 MG PO TABS: 15.0000 mg | ORAL_TABLET | Freq: Every day | ORAL | Status: DC

## 2012-08-02 NOTE — Assessment & Plan Note (Signed)
Most likely secondary to osteoarthritis. No signs of DVT or injury. Will treat with Mobic po daily and continue Percocet. May also use OTC muscle rub for local relief. Elevate leg as much as possible. RTC in 1 month for follow up, or sooner if needed

## 2012-08-02 NOTE — Progress Notes (Signed)
Patient ID: Mariah [name], female   DOB: 1961-04-17, 51 y.o.   MRN: [id number] [facility] Gisela Lea M. Sharlett Lienemann, MD Phone: [phone]   Subjective: HPI: Patient is a 52 y.o. female presenting to clinic today for same day appointment for knee pain. Pain started in left knee 5 days ago. She denies any injury but states she could have ran into something. Started swelling 2 days ago and has difficulty walking. She elevates the leg and the swelling improved. She also used ice pack and Percocet prn. No redness in knee, no swelling in leg, or red  History Reviewed: Non-smoker. Health Maintenance: Declines flu shot  ROS: Please see HPI above.  Objective: Office vital signs reviewed.  Physical Examination:  General: Awake, alert. NAD Pulm: CTAB, no wheezes Cardio: RRR, no murmurs appreciated Extremities: No edema. Right knee, mild swelling medial knee. No crepitus, no erythema. Normal gait.  Neuro: Grossly intact  Assessment: 51 yo F with left knee osteoarthritis  Plan: See Problem List and After Visit Summary

## 2012-08-02 NOTE — Patient Instructions (Signed)
It was good to see you today!   I have sent in a prescription for an anti-inflammatory to take daily. You can also use a muscle rub as needed.  Let me know if you need anything else!  Anjel Pardo M. Ohm Dentler, M.D.

## 2012-08-05 NOTE — MAU Provider Note (Signed)
Attestation of Attending Supervision of Advanced Practitioner: Evaluation and management procedures were performed by the PA/NP/CNM/OB Fellow under my supervision/collaboration. Chart reviewed and agree with management and plan.  Othel Dicostanzo V 08/05/2012 5:18 AM

## 2012-09-01 ENCOUNTER — Other Ambulatory Visit: Payer: Self-pay | Admitting: *Deleted

## 2012-09-01 ENCOUNTER — Telehealth: Payer: Self-pay | Admitting: Family Medicine

## 2012-09-01 MED ORDER — ATENOLOL 50 MG PO TABS: 50.0000 mg | ORAL_TABLET | Freq: Every day | ORAL | Status: DC

## 2012-09-01 MED ORDER — ALPRAZOLAM 1 MG PO TABS: 1.0000 mg | ORAL_TABLET | Freq: Three times a day (TID) | ORAL | Status: DC | PRN

## 2012-09-01 MED ORDER — HYDROCHLOROTHIAZIDE 25 MG PO TABS: 25.0000 mg | ORAL_TABLET | Freq: Every day | ORAL | Status: DC

## 2012-09-01 NOTE — Telephone Encounter (Signed)
Advised pt of Rx at front desk. Pt voiced understanding.

## 2012-09-01 NOTE — Telephone Encounter (Signed)
Please let Ms. Sharol Harness know that she will need to pick up her Xanax 1mg  Rx. I have placed it at the front desk but I cannot send it to the pharmacy.  Thanks! Latriece Anstine M. Christie Copley, M.D.

## 2012-09-21 ENCOUNTER — Encounter: Payer: Self-pay | Admitting: Family Medicine

## 2012-09-21 ENCOUNTER — Ambulatory Visit (INDEPENDENT_AMBULATORY_CARE_PROVIDER_SITE_OTHER): Payer: Medicaid Other | Admitting: Family Medicine

## 2012-09-21 VITALS — BP 125/78 | HR 64 | Temp 98.4°F | Ht 66.0 in

## 2012-09-21 DIAGNOSIS — R040 Epistaxis: Secondary | ICD-10-CM | POA: Insufficient documentation

## 2012-09-21 DIAGNOSIS — I10 Essential (primary) hypertension: Secondary | ICD-10-CM

## 2012-09-21 NOTE — Assessment & Plan Note (Signed)
well controlled  

## 2012-09-21 NOTE — Progress Notes (Signed)
  Subjective:    Patient ID: Mariah [name], female    DOB: 1960/12/16, 52 y.o.   MRN: [id number]  HPI After coughing spell last week she developed bleeding from the right nare that she treated with pressure and lying back. It has recurred twice since, one with blowing nose and next spontaneous. It hasn't been large amounts and she hasn't had bleeding from other places. No current aspirin or NSAID's. She has a mildly scratchy throat, but no recent sneezing. Is using her Flonase and Cetirizine.    Review of Systems     Objective:   Physical Exam ENT normal. No bleeding sites in anterior nares. Chest clear.        Assessment & Plan:

## 2012-09-21 NOTE — Patient Instructions (Addendum)
Please follow the instructions on the information sheet.   Avoid taking the Cetirizine if not sneezing or too congested.

## 2012-09-21 NOTE — Assessment & Plan Note (Signed)
>>  ASSESSMENT AND PLAN FOR HYPERTENSION, BENIGN WRITTEN ON 09/21/2012  9:35 PM BY HALE, Claudean KindsWAYNE ANDREW, MD  well controlled

## 2012-09-21 NOTE — Assessment & Plan Note (Signed)
Due to URI (resolving), allergic rhinitis, dry air. No current need for cauterization  I reviewed patient education sheet with her.

## 2012-09-22 ENCOUNTER — Ambulatory Visit: Payer: Medicaid Other | Admitting: Family Medicine

## 2012-12-13 ENCOUNTER — Emergency Department (HOSPITAL_COMMUNITY)
Admission: EM | Admit: 2012-12-13 | Discharge: 2012-12-14 | Disposition: A | Discharge status: Home / Self Care | Payer: Medicaid Other | Attending: Emergency Medicine | Admitting: Emergency Medicine

## 2012-12-13 ENCOUNTER — Encounter (HOSPITAL_COMMUNITY): Payer: Self-pay | Admitting: Emergency Medicine

## 2012-12-13 DIAGNOSIS — Z8679 Personal history of other diseases of the circulatory system: Secondary | ICD-10-CM | POA: Insufficient documentation

## 2012-12-13 DIAGNOSIS — I10 Essential (primary) hypertension: Secondary | ICD-10-CM | POA: Insufficient documentation

## 2012-12-13 DIAGNOSIS — R05 Cough: Secondary | ICD-10-CM | POA: Insufficient documentation

## 2012-12-13 DIAGNOSIS — Z8739 Personal history of other diseases of the musculoskeletal system and connective tissue: Secondary | ICD-10-CM | POA: Insufficient documentation

## 2012-12-13 DIAGNOSIS — R059 Cough, unspecified: Secondary | ICD-10-CM | POA: Insufficient documentation

## 2012-12-13 DIAGNOSIS — M549 Dorsalgia, unspecified: Secondary | ICD-10-CM

## 2012-12-13 DIAGNOSIS — G8929 Other chronic pain: Secondary | ICD-10-CM | POA: Insufficient documentation

## 2012-12-13 DIAGNOSIS — Z87891 Personal history of nicotine dependence: Secondary | ICD-10-CM | POA: Insufficient documentation

## 2012-12-13 DIAGNOSIS — M51369 Other intervertebral disc degeneration, lumbar region without mention of lumbar back pain or lower extremity pain: Secondary | ICD-10-CM | POA: Insufficient documentation

## 2012-12-13 DIAGNOSIS — Z8719 Personal history of other diseases of the digestive system: Secondary | ICD-10-CM | POA: Insufficient documentation

## 2012-12-13 DIAGNOSIS — I341 Nonrheumatic mitral (valve) prolapse: Secondary | ICD-10-CM | POA: Insufficient documentation

## 2012-12-13 DIAGNOSIS — M546 Pain in thoracic spine: Secondary | ICD-10-CM | POA: Insufficient documentation

## 2012-12-13 DIAGNOSIS — J3489 Other specified disorders of nose and nasal sinuses: Secondary | ICD-10-CM | POA: Insufficient documentation

## 2012-12-13 DIAGNOSIS — IMO0002 Reserved for concepts with insufficient information to code with codable children: Secondary | ICD-10-CM | POA: Insufficient documentation

## 2012-12-13 DIAGNOSIS — Z79899 Other long term (current) drug therapy: Secondary | ICD-10-CM | POA: Insufficient documentation

## 2012-12-13 DIAGNOSIS — M5136 Other intervertebral disc degeneration, lumbar region: Secondary | ICD-10-CM | POA: Insufficient documentation

## 2012-12-13 HISTORY — DX: Essential (primary) hypertension: I10

## 2012-12-13 HISTORY — DX: Other intervertebral disc degeneration, lumbar region without mention of lumbar back pain or lower extremity pain: M51.369

## 2012-12-13 HISTORY — DX: Nonrheumatic mitral (valve) prolapse: I34.1

## 2012-12-13 HISTORY — DX: Other intervertebral disc degeneration, lumbar region: M51.36

## 2012-12-13 NOTE — ED Notes (Signed)
Patient complaining of mid and upper back pain that started yesterday; patient reports that pain increases in severity when she coughs -- reports that she coughs all the time due to seasonal allergies.  Patient has history of degenerative disc disease and history of two back surgeries.  Denies shortness of breath and chest pain.

## 2012-12-14 ENCOUNTER — Emergency Department (HOSPITAL_COMMUNITY): Payer: Medicaid Other

## 2012-12-14 MED ORDER — IBUPROFEN 800 MG PO TABS: 800.0000 mg | ORAL_TABLET | Freq: Three times a day (TID) | ORAL | Status: DC

## 2012-12-14 MED ORDER — DIAZEPAM 5 MG PO TABS
5.0000 mg | ORAL_TABLET | Freq: Once | ORAL | Status: AC
  Administered 2012-12-14: 5 mg via ORAL
  Filled 2012-12-14: qty 1

## 2012-12-14 MED ORDER — KETOROLAC TROMETHAMINE 60 MG/2ML IM SOLN
60.0000 mg | Freq: Once | INTRAMUSCULAR | Status: AC
  Administered 2012-12-14: 60 mg via INTRAMUSCULAR
  Filled 2012-12-14: qty 2

## 2012-12-14 MED ORDER — ONDANSETRON HCL 4 MG/2ML IJ SOLN
INTRAMUSCULAR | Status: AC
  Filled 2012-12-14: qty 2

## 2012-12-14 MED ORDER — DIAZEPAM 5 MG PO TABS: 5.0000 mg | ORAL_TABLET | Freq: Two times a day (BID) | ORAL | Status: DC

## 2012-12-14 NOTE — ED Notes (Signed)
Pt back from x-ray.

## 2012-12-14 NOTE — ED Notes (Signed)
Patient transported to X-ray 

## 2012-12-14 NOTE — ED Provider Notes (Signed)
History     CSN: [id number]  Arrival date & time 12/13/12  2300   None     Chief Complaint  Patient presents with  . Back Pain    (Consider location/radiation/quality/duration/timing/severity/associated sxs/prior treatment) HPI History provided by pt.   Pt has had pain bilateral thoracic region since yesterday.  Aggravated by movement and deep inspiration and improves w/ percocet, when she takes for chronic cervical/lumbar spine pain attributed to DJD.  Associated w/ nasal congestion and cough.  Denies fever, CP, SOB, extremity weakness/paresthsias and bowel/bladder dysfunction.  Denies trauma.  Concerned she could have walking pneumonia.  Past Medical History  Diagnosis Date  . GERD (gastroesophageal reflux disease)   . Degenerative disc disease, lumbar   . Hypertension   . Mitral valve prolapse     Past Surgical History  Procedure Laterality Date  . Cholecystectomy    . Back surgery    . Abdominal hysterectomy    . Abdominal hysterectomy      History reviewed. No pertinent family history.  History  Substance Use Topics  . Smoking status: Former Smoker    Quit date: 09/16/1995  . Smokeless tobacco: Never Used  . Alcohol Use: Yes    OB History   Grav Para Term Preterm Abortions TAB SAB Ect Mult Living   2 2              Review of Systems  All other systems reviewed and are negative.    Allergies  Lisinopril; Moxifloxacin; and Sulfonamide derivatives  Home Medications   Current Outpatient Rx  Name  Route  Sig  Dispense  Refill  . atenolol (TENORMIN) 50 MG tablet   Oral   Take 1 tablet (50 mg total) by mouth daily.   30 tablet   5   . cetirizine (ZYRTEC ALLERGY) 10 MG tablet   Oral   Take 1 tablet (10 mg total) by mouth daily.   30 tablet   11   . fluticasone (FLONASE) 50 MCG/ACT nasal spray   Nasal   Place 1 spray into the nose daily.   16 g   5   . hydrochlorothiazide (HYDRODIURIL) 25 MG tablet   Oral   Take 1 tablet (25 mg total) by  mouth daily.   30 tablet   5   . Multiple Vitamin (MULTIVITAMIN WITH MINERALS) TABS   Oral   Take 1 tablet by mouth daily.         Marland Kitchen oxyCODONE-acetaminophen (PERCOCET) 10-325 MG per tablet   Oral   Take 1 tablet by mouth every 4 (four) hours as needed. For pain           BP 121/60  Pulse 65  Temp(Src) 97.7 F (36.5 C) (Oral)  Resp 20  SpO2 98%  Physical Exam  Nursing note and vitals reviewed. Constitutional: She is oriented to person, place, and time. She appears well-developed and well-nourished. No distress.  HENT:  Head: Normocephalic and atraumatic.  Eyes:  Normal appearance  Neck: Normal range of motion.  Cardiovascular: Normal rate and regular rhythm.   Pulmonary/Chest: Effort normal and breath sounds normal. No respiratory distress.  Musculoskeletal: Normal range of motion.  Palpation of thoracic spine and surrounding soft tissues improves pain.  All four extremities w/ nml ROM and w/out NV deficits. Nml patellar reflexes.   Neurological: She is alert and oriented to person, place, and time.  Skin: Skin is warm and dry. No rash noted.  Psychiatric: She has a normal mood and  affect. Her behavior is normal.    ED Course  Procedures (including critical care time)  Labs Reviewed - No data to display Dg Chest 2 View  12/14/2012  *RADIOLOGY REPORT*  Clinical Data: Bilateral thoracic pain with cough.  Sinus drainage.  CHEST - 2 VIEW  Comparison: 12/17/2011  Findings: The heart size and pulmonary vascularity are normal. The lungs appear clear and expanded without focal air space disease or consolidation. No blunting of the costophrenic angles.  No pneumothorax.  Mediastinal contours appear intact.  Degenerative changes in the spine.  Surgical clips in the right upper quadrant. No significant change since previous study.  IMPRESSION: No evidence of active pulmonary disease.   Original Report Authenticated By: Burman Nieves, M.D.      1. Back pain       MDM   [phone] F w/ cervical and lumbar DJD, presents w/ diffuse, non-traumatic thoracic back pain since yesterday.  Associated w/ cough and pt concerned she could have walking pneumonia.  On exam, afebrile, no respiratory distress, nml breath sounds, no tenderness of thoracic spine or soft tissues, no NV deficits and nml ROM of extremities.  CXR pending.  Pt has received po valium and IM toradol.  Suspect muscle strain.  1:51 AM   Pt reports improvement in pain.  CXR neg.  Results discussed w/ pt.  Pt d/c'd home w/ valium and I recommended NSAID, rest and heat as well.  Return precautions discussed. 3:32 AM         Otilio Miu, PA-C 12/14/12 [id number]

## 2012-12-14 NOTE — ED Provider Notes (Signed)
Medical screening examination/treatment/procedure(s) were performed by non-physician practitioner and as supervising physician I was immediately available for consultation/collaboration.   Kihanna Kamiya L Sylvanus Telford, MD 12/14/12 0658 

## 2012-12-14 NOTE — ED Notes (Signed)
Pt c/o mid upper back pain onset yesterday.  Pt denies cold symptoms but st's she coughs every am.  Pt st's pain increases with breathing.

## 2012-12-14 NOTE — ED Notes (Signed)
Pt requesting Pecription of motrin due to the medicaid will pay for it. PA notified

## 2012-12-23 ENCOUNTER — Encounter: Payer: Self-pay | Admitting: Family Medicine

## 2012-12-23 ENCOUNTER — Other Ambulatory Visit (HOSPITAL_COMMUNITY): Payer: Self-pay | Admitting: Family Medicine

## 2012-12-23 ENCOUNTER — Ambulatory Visit (HOSPITAL_COMMUNITY)
Admission: RE | Admit: 2012-12-23 | Discharge: 2012-12-23 | Disposition: A | Discharge status: Home / Self Care | Payer: Medicaid Other | Source: Ambulatory Visit | Attending: Family Medicine | Admitting: Family Medicine

## 2012-12-23 ENCOUNTER — Ambulatory Visit (INDEPENDENT_AMBULATORY_CARE_PROVIDER_SITE_OTHER): Payer: Medicaid Other | Admitting: Family Medicine

## 2012-12-23 VITALS — BP 134/80 | HR 60 | Ht 66.0 in | Wt 224.0 lb

## 2012-12-23 DIAGNOSIS — M25561 Pain in right knee: Secondary | ICD-10-CM

## 2012-12-23 DIAGNOSIS — M79609 Pain in unspecified limb: Secondary | ICD-10-CM

## 2012-12-23 DIAGNOSIS — E669 Obesity, unspecified: Secondary | ICD-10-CM | POA: Insufficient documentation

## 2012-12-23 DIAGNOSIS — M79661 Pain in right lower leg: Secondary | ICD-10-CM

## 2012-12-23 DIAGNOSIS — M79669 Pain in unspecified lower leg: Secondary | ICD-10-CM | POA: Insufficient documentation

## 2012-12-23 NOTE — Progress Notes (Signed)
Subjective:     Patient ID: Mariah [name], female   DOB: 1961-06-25, 52 y.o.   MRN: [id number]  HPI Right calf pain: 2 wk ago she had right calf swelling,worse with ambulation,feels like pulling with walking and at times aching with rest,this is associated with right foot swelling,pain and swelling later went away but since yesterday she started to have right calf pain again.She denies any recent trauma to her calf, pain is about 5/10 in severity, no redness of her calf although it is swollen. She does not get enough exercise due to nerve pain.She has been using percocet for pain.She is concern because her brother had DVT and later died from PE.  Past Medical History  Diagnosis Date  . GERD (gastroesophageal reflux disease)   . Degenerative disc disease, lumbar   . Hypertension   . Mitral valve prolapse       Review of Systems  Eyes: Negative for visual disturbance.  Respiratory: Negative.   Cardiovascular: Negative.   Genitourinary: Negative for decreased urine volume.  Musculoskeletal:       Right calf pain  All other systems reviewed and are negative.    Filed Vitals:   12/23/12 1149  BP: 134/80  Pulse: 60  Height: 5\' 6"  (1.676 m)  Weight: 224 lb (101.606 kg)       Objective:   Physical Exam  Nursing note and vitals reviewed. Constitutional: She appears well-developed. No distress.  Cardiovascular: Normal rate, regular rhythm and normal heart sounds.   No murmur heard. Pulmonary/Chest: Effort normal and breath sounds normal. No respiratory distress. She has no wheezes. She exhibits no tenderness.  Musculoskeletal: Normal range of motion. She exhibits tenderness. She exhibits no edema.       Legs:      Assessment:     Calf pain: Likely muscle sprain but I will like to r/o dvt due to sedentary lifestyle and hx of DVT in brother.     Plan:     Motrin recommended prn pain. U/S right LL ordered to r/o DVT. RTC in 1 wk for reassessment.

## 2012-12-23 NOTE — Progress Notes (Signed)
Right lower extremity venous duplex completed.  Right:  No evidence of DVT, superficial thrombosis, or Baker's cyst.  Left:  Negative for DVT in the common femoral vein.  

## 2012-12-23 NOTE — Patient Instructions (Signed)
Leg Pain  It was nice to see you today,I am also sorry about your calf pain,it might be muscle sprain,but I will like to rule out DVT. I will call you with test result.If pain worsen please call or go to the [facility] in 1 wk.

## 2012-12-23 NOTE — Assessment & Plan Note (Signed)
Calf pain: Likely muscle sprain but I will like to r/o dvt due to sedentary lifestyle and hx of DVT in brother.  Motrin recommended prn pain. U/S right LL ordered to r/o DVT. RTC in 1 wk for reassessment.

## 2013-01-21 ENCOUNTER — Telehealth: Payer: Self-pay | Admitting: Family Medicine

## 2013-01-21 NOTE — Telephone Encounter (Signed)
Did not see any requests from pharmacy.  Will forward to PCP to review.

## 2013-01-21 NOTE — Telephone Encounter (Signed)
Patient is calling because her pharmacy asked her to call because they have faxed over refill request for Alprazolam, twice, but have not heard back.  I do not see any record of this.  Patient would like a call when this has been taken care of.

## 2013-01-24 NOTE — Telephone Encounter (Signed)
I have not had any refill requests for Alprazolam. Please have patient make an appointment for a prescription refill.  Thank you. Damondre Pfeifle M. Melenda Bielak, M.D.

## 2013-01-24 NOTE — Telephone Encounter (Signed)
Pt states she has appt 5/13 and will discuss w/Dr Hairford  then

## 2013-01-25 ENCOUNTER — Ambulatory Visit (INDEPENDENT_AMBULATORY_CARE_PROVIDER_SITE_OTHER): Payer: Medicaid Other | Admitting: Family Medicine

## 2013-01-25 VITALS — BP 115/78 | HR 74 | Ht 66.0 in | Wt 219.0 lb

## 2013-01-25 DIAGNOSIS — M549 Dorsalgia, unspecified: Secondary | ICD-10-CM

## 2013-01-25 DIAGNOSIS — I10 Essential (primary) hypertension: Secondary | ICD-10-CM

## 2013-01-25 DIAGNOSIS — J309 Allergic rhinitis, unspecified: Secondary | ICD-10-CM

## 2013-01-25 DIAGNOSIS — Z79899 Other long term (current) drug therapy: Secondary | ICD-10-CM

## 2013-01-25 DIAGNOSIS — F411 Generalized anxiety disorder: Secondary | ICD-10-CM

## 2013-01-25 LAB — CBC
HCT: 37.6 % (ref 36.0–46.0)
Hemoglobin: 12.7 g/dL (ref 12.0–15.0)
MCH: 27.5 pg (ref 26.0–34.0)
MCHC: 33.8 g/dL (ref 30.0–36.0)
MCV: 81.4 fL (ref 78.0–100.0)
Platelets: 271 10*3/uL (ref 150–400)
RBC: 4.62 MIL/uL (ref 3.87–5.11)
RDW: 15.2 % (ref 11.5–15.5)
WBC: 8.5 10*3/uL (ref 4.0–10.5)

## 2013-01-25 MED ORDER — HYDROCHLOROTHIAZIDE 25 MG PO TABS: 25.0000 mg | ORAL_TABLET | Freq: Every day | ORAL | Status: DC

## 2013-01-25 MED ORDER — FLUTICASONE PROPIONATE 50 MCG/ACT NA SUSP: 2.0000 | Freq: Every day | NASAL | Status: DC

## 2013-01-25 MED ORDER — ALPRAZOLAM 1 MG PO TABS: 1.0000 mg | ORAL_TABLET | Freq: Three times a day (TID) | ORAL | Status: DC | PRN

## 2013-01-25 MED ORDER — CETIRIZINE HCL 10 MG PO TABS: 10.0000 mg | ORAL_TABLET | Freq: Every day | ORAL | Status: DC

## 2013-01-25 MED ORDER — ATENOLOL 50 MG PO TABS: 50.0000 mg | ORAL_TABLET | Freq: Every day | ORAL | Status: DC

## 2013-01-25 NOTE — Patient Instructions (Signed)
It was good to see you today! I am sorry your back is acting up.  Please stop by the lab to get your blood work. If anything is really abnormal, I will call you but otherwise I will send you a letter.   If your contact needs anything from me for your rehab, please let me know.  Mariah [name] M. Mariah [name], M.D.

## 2013-01-25 NOTE — Progress Notes (Signed)
Patient ID: Mariah [name], female   DOB: 10-09-1960, 52 y.o.   MRN: [id number]  [facility] Devona Holmes M. Hasana Alcorta, MD Phone: [phone]   Subjective: HPI: Patient is a 52 y.o. female presenting to clinic today for CPE. Concerns today include refills and back pain  1. CPE - Last labs were in April 2013. Last mammo was in Jan 2013, due for that now. Colonoscopy is due, but has Medicaid. UTD on immunizations.  2. Back pain- Has pain, followed by Dr. Remigio Eisenmenger. She states that she has some DJD and 2 discs are bad enough to put pressure on nerves. She is being referred to Dr. Jeral Fruit who she saw yesterday and he is recommending surgery. Medicaid does not cover her recovery and that is stressful for her. This is putting a lot of anxiety on her. She has spoken with a community resource that can help her get a home health aid.   3. Medication refills- Patient requesting refills including her Xanax which she is using only prn. Patient states that with the financial stressors in her like she feels like this is the only thing that helps. We discussed other ways to reduce stress, and patient agrees.  History Reviewed: Former smoker.  ROS: Please see HPI above.  Objective: Office vital signs reviewed. BP 115/78  Pulse 74  Ht 5\' 6"  (1.676 m)  Wt 219 lb (99.338 kg)  BMI 35.36 kg/m2  Physical Examination:  General: Awake, alert. NAD, resting with feet up in chair. HEENT: Atraumatic, normocephalic. MMM Pulm: CTAB, no wheezes Cardio: RRR, no murmurs appreciated Abdomen: Obese. +BS, soft, nontender, nondistended Back: Diffusely tender. Pt able to stand up on her own rather quickly without using her arms Extremities: No edema. Healed scars on right shoulder.  Neuro: Grossly intact  Assessment: 52 y.o. female CPE  Plan: See Problem List and After Visit Summary

## 2013-01-26 ENCOUNTER — Encounter: Payer: Self-pay | Admitting: Family Medicine

## 2013-01-26 LAB — COMPREHENSIVE METABOLIC PANEL
ALT: 15 U/L (ref 0–35)
AST: 15 U/L (ref 0–37)
Albumin: 4.6 g/dL (ref 3.5–5.2)
Alkaline Phosphatase: 71 U/L (ref 39–117)
BUN: 15 mg/dL (ref 6–23)
CO2: 24 mEq/L (ref 19–32)
Calcium: 9.8 mg/dL (ref 8.4–10.5)
Chloride: 101 mEq/L (ref 96–112)
Creat: 0.74 mg/dL (ref 0.50–1.10)
Glucose, Bld: 86 mg/dL (ref 70–99)
Potassium: 3.9 mEq/L (ref 3.5–5.3)
Sodium: 134 mEq/L — ABNORMAL LOW (ref 135–145)
Total Bilirubin: 0.3 mg/dL (ref 0.3–1.2)
Total Protein: 7 g/dL (ref 6.0–8.3)

## 2013-01-27 DIAGNOSIS — M549 Dorsalgia, unspecified: Secondary | ICD-10-CM | POA: Insufficient documentation

## 2013-01-27 NOTE — Assessment & Plan Note (Addendum)
Anxiety worsened by back pain and financial stress. Refilled Xanax but pt told to not take more than prescribed and encouraged to find other ways to deal with stress. F/u in 1-2 months

## 2013-01-27 NOTE — Assessment & Plan Note (Signed)
At goal. Will check labs today. Continue current medications.

## 2013-01-27 NOTE — Assessment & Plan Note (Signed)
Followed by neurosurgery. Pt will continue to follow up with them to discuss surgery. She has a Tourist information centre manager she is working with to discuss how to afford the rehab from her surgery. If I can be of any assistance, she will let me know.

## 2013-01-27 NOTE — Assessment & Plan Note (Signed)
>>  ASSESSMENT AND PLAN FOR HYPERTENSION, BENIGN WRITTEN ON 01/27/2013 10:56 AM BY HAIRFORD, AMBER M, MD  At goal. Will check labs today. Continue current medications.

## 2013-01-27 NOTE — Assessment & Plan Note (Signed)
Zyrtec and Flonase refilled 

## 2013-02-22 ENCOUNTER — Ambulatory Visit (INDEPENDENT_AMBULATORY_CARE_PROVIDER_SITE_OTHER): Payer: Medicaid Other | Admitting: Family Medicine

## 2013-02-22 ENCOUNTER — Encounter: Payer: Self-pay | Admitting: Family Medicine

## 2013-02-22 VITALS — BP 147/91 | HR 59 | Temp 98.5°F | Ht 66.0 in | Wt 221.0 lb

## 2013-02-22 DIAGNOSIS — L84 Corns and callosities: Secondary | ICD-10-CM

## 2013-02-22 DIAGNOSIS — B070 Plantar wart: Secondary | ICD-10-CM | POA: Insufficient documentation

## 2013-02-22 MED ORDER — IBUPROFEN 800 MG PO TABS: 800.0000 mg | ORAL_TABLET | Freq: Three times a day (TID) | ORAL | Status: DC

## 2013-02-22 NOTE — Progress Notes (Signed)
Patient ID: PATSEY PITSTICK, female   DOB: 1961/08/08, 53 y.o.   MRN: [id number]  [facility] Argelia Formisano M. Julus Kelley, MD Phone: [phone]   Subjective: HPI: Patient is a 52 y.o. female presenting to clinic today for office visit for right sore foot.  Reports pain to the top of her right foot. Reports swelling on the top medial side of foot, pain worse with walking. Started a few weeks ago as an ache, has not changed since it started. (Does report having a similar pain a while back that resolved.) Wears sandals with arch support which helps. Does not walk barefoot so unable to tell if that makes it worse. She states her foot is the most sore in the morning but does not go away during the day. Does have episodes of sharp shooting pain. Reports no injury to her foot. Reports a dark callus on the bottom of her foot that is painful. It has been getting bigger and more sore. Never remembers it looking like a wart. No other areas like this on her foot.  History Reviewed: Former smoker. Health Maintenance: Needs mammogram  ROS: Please see HPI above.  Objective: Office vital signs reviewed. BP 147/91  Pulse 59  Temp(Src) 98.5 F (36.9 C) (Oral)  Wt 221 lb (100.245 kg)  BMI 35.69 kg/m2  Physical Examination:  General: Awake, alert. NAD. Very pleasant Extremities: No edema of legs. Right foot with small amount of swelling distal to medial ankle. Ankle joint stable. No definitive point tenderness of foot. 2 cm hyperpigemented, thickened area with central punctate 5 cm proximal to base of great right toe with TTP. No erythema surrounding area. Neuro: Grossly intact  Assessment: 52 y.o. female with right foot pain and callus  Plan: See Problem List and After Visit Summary

## 2013-02-22 NOTE — Patient Instructions (Addendum)
It was good to see you today.   We are going to send you to podiatry to work on the callus first to see if that will help with your pain.  I will see you back in about 2 months, or sooner if you need anything. Please schedule your mammogram as well.  Mariah [name], M.D.

## 2013-02-22 NOTE — Assessment & Plan Note (Signed)
Foot pain likely secondary to callus on the bottom of foot causing her to change her gait. Unsure of etiology of callus, but could have started as a plantar wart. Will refer to podiatry for evaluation and treatment. Discussed with Dr. Jennette Kettle who agrees with plan. F/u with me as needed. For now, ibuprofen for pain and comfortable shoes as much as possible.

## 2013-03-25 ENCOUNTER — Ambulatory Visit (INDEPENDENT_AMBULATORY_CARE_PROVIDER_SITE_OTHER): Payer: Medicaid Other | Admitting: Sports Medicine

## 2013-03-25 ENCOUNTER — Encounter: Payer: Self-pay | Admitting: Sports Medicine

## 2013-03-25 VITALS — BP 114/78 | HR 82 | Temp 97.9°F | Ht 66.0 in | Wt 225.3 lb

## 2013-03-25 DIAGNOSIS — J309 Allergic rhinitis, unspecified: Secondary | ICD-10-CM

## 2013-03-25 DIAGNOSIS — J329 Chronic sinusitis, unspecified: Secondary | ICD-10-CM

## 2013-03-25 MED ORDER — CEPHALEXIN 500 MG PO CAPS: 500.0000 mg | ORAL_CAPSULE | Freq: Three times a day (TID) | ORAL | Status: DC

## 2013-03-25 MED ORDER — FLUCONAZOLE 150 MG PO TABS: 150.0000 mg | ORAL_TABLET | Freq: Once | ORAL | Status: DC

## 2013-03-25 NOTE — Assessment & Plan Note (Signed)
Sx with progressive decline over 2 weeks Counseled on correct flonase/saline use Rx for ABX + diflucan > f/u flonase & saline use

## 2013-03-25 NOTE — Patient Instructions (Addendum)
It was nice to see you today.   Today we discussed: 1. Sinus infection Please take: - cephALEXin (KEFLEX) 500 MG capsule; Take 1 capsule (500 mg total) by mouth 3 (three) times daily.  Dispense: 21 capsule; Refill: 0 - fluconazole (DIFLUCAN) 150 MG tablet; Take 1 tablet (150 mg total) by mouth once.  Dispense: 1 tablet; Refill: 0  If you have any facial pressure or pain you can take Advil to help with this  2. ALLERGIC RHINITIS, SEASONAL, MILD Remember we talked about regarding your Flonase and saline You should use your saline first and below your nose thoroughly.  Cross hands and use your Flonase while inhaling 2 sprays on each side.  Do this once per day.   Please plan to return as needed  If you need anything prior to seeing me please call the clinic.  Please Bring all medications with you to each appointment.

## 2013-03-25 NOTE — Assessment & Plan Note (Signed)
Counts all correct Flonase use Continue use of Flonase and Zyrtec.

## 2013-03-25 NOTE — Progress Notes (Signed)
  [facility]  Patient name: Mariah [name] MRN [id number]  Date of birth: Oct 14, 1960  CC & HPI:  Mariah [name] is a 52 y.o. female presenting today for:  # Acute RESPIRATORY Symptoms: Major Sxs:  nasal congestion, facial pressure  Character  cough, night time pressure  Onset  2 weeks, progressive worsening  Fevers/Chills  no  Rigors  no  N/V  no  Diarrhea    Weight Loss    Sick Contacts  yes - grandchildren's birthday party  Other  Not been using flonase correctly; not inhaling, rinsing with saline and blowing after use  Therapy Tried  Flonase, Saline    ROS:  Per HPI  Pertinent History Reviewed:  Medical & Surgical Hx:  Reviewed: Significant for HTN, MVP, Obesity - Seasonal Allergies Medications: Reviewed & Updated - see associated section Social History: Reviewed -  reports that she quit smoking about 17 years ago. She has never used smokeless tobacco.  Objective Findings:  Vitals: BP 114/78  Pulse 82  Temp(Src) 97.9 F (36.6 C) (Oral)  Ht 5\' 6"  (1.676 m)  Wt 225 lb 4.8 oz (102.195 kg)  BMI 36.38 kg/m2  PE: GENERAL: Adult  female. In no discomfort; no respiratory distress  PSYCH:  alert and appropriate, good insight   HNEENT:  H&N: AT/[place], trachea midline, anterior cervical lymphadenopathy   Eyes: no scleral icterus, no exudate  Ears:  bilateral effusions without tympanic membrane erythema,   Oropharynx: MMM, mild pharyngeal erythema without pharyngeal exudate.    Dentention:   Nose:  normal nasal mucosa with minimal exudate     Heart:  RRR, S1/S2, no murmur  LUNGS:  CTA B, no wheezes, no crackles  ABDOMEN:    GU:   SKIN:   NEUROMSK:          Assessment & Plan:

## 2013-05-30 ENCOUNTER — Encounter (HOSPITAL_COMMUNITY): Payer: Self-pay | Admitting: *Deleted

## 2013-05-30 ENCOUNTER — Emergency Department (HOSPITAL_COMMUNITY)
Admission: EM | Admit: 2013-05-30 | Discharge: 2013-05-30 | Disposition: A | Discharge status: Home / Self Care | Payer: Medicaid Other | Attending: Emergency Medicine | Admitting: Emergency Medicine

## 2013-05-30 DIAGNOSIS — M25561 Pain in right knee: Secondary | ICD-10-CM

## 2013-05-30 DIAGNOSIS — Z79899 Other long term (current) drug therapy: Secondary | ICD-10-CM | POA: Insufficient documentation

## 2013-05-30 DIAGNOSIS — M549 Dorsalgia, unspecified: Secondary | ICD-10-CM | POA: Insufficient documentation

## 2013-05-30 DIAGNOSIS — Z87891 Personal history of nicotine dependence: Secondary | ICD-10-CM | POA: Insufficient documentation

## 2013-05-30 DIAGNOSIS — Z9889 Other specified postprocedural states: Secondary | ICD-10-CM | POA: Insufficient documentation

## 2013-05-30 DIAGNOSIS — I10 Essential (primary) hypertension: Secondary | ICD-10-CM | POA: Insufficient documentation

## 2013-05-30 DIAGNOSIS — M25569 Pain in unspecified knee: Secondary | ICD-10-CM | POA: Insufficient documentation

## 2013-05-30 DIAGNOSIS — Z8719 Personal history of other diseases of the digestive system: Secondary | ICD-10-CM | POA: Insufficient documentation

## 2013-05-30 DIAGNOSIS — R52 Pain, unspecified: Secondary | ICD-10-CM | POA: Insufficient documentation

## 2013-05-30 MED ORDER — MELOXICAM 7.5 MG PO TABS: 7.5000 mg | ORAL_TABLET | Freq: Every day | ORAL | Status: DC

## 2013-05-30 NOTE — ED Provider Notes (Signed)
CSN: [id number]     Arrival date & time 05/30/13  1308 History  This chart was scribed for Rhea Bleacher, PA, working with Celene Kras, MD by Blanchard Kelch, ED Scribe. This patient was seen in room TR07C/TR07C and the patient's care was started at 9:42 AM.    Chief Complaint  Patient presents with  . Leg Pain   Patient is a 52 y.o. female presenting with leg pain. The history is provided by the patient. No language interpreter was used.  Leg Pain Associated symptoms: back pain   Associated symptoms: no neck pain     HPI Comments: Mariah [name] is a 52 y.o. female who presents to the Emergency Department complaining of right sided foot pain with associated swelling that radiates to her knee and began about two months ago. The pain is intermittent and episodes last about 15 seconds. She reports that upon waking this morning it felt "like a weight was on her leg." She denies pain currently but says it feels tight. She went to her PCP for foot pain and was diagnosed with warts. She has pain from walking on them and may have been walking differently to alleviate that pain. She complains of baseline back pain. She had a prior right knee surgery by Dr. Thurston Hole. She takes pain medication (oxycodone) for her back but denies taking any this morning. She hasn't traveled long distance recently. No recent surgeries or immobilizations. No h/o DVT. She reports that her brother and cousin died from blood clots.   Past Medical History  Diagnosis Date  . GERD (gastroesophageal reflux disease)   . Degenerative disc disease, lumbar   . Hypertension   . Mitral valve prolapse    Past Surgical History  Procedure Laterality Date  . Cholecystectomy    . Back surgery    . Abdominal hysterectomy    . Abdominal hysterectomy    . Knee surgery     No family history on file. History  Substance Use Topics  . Smoking status: Former Smoker    Quit date: 09/16/1995  . Smokeless tobacco: Never Used  . Alcohol  Use: Yes   OB History   Grav Para Term Preterm Abortions TAB SAB Ect Mult Living   2 2             Review of Systems  Constitutional: Negative for activity change.  HENT: Negative for neck pain.   Musculoskeletal: Positive for back pain and arthralgias. Negative for joint swelling.  Skin: Negative for wound.       Warts on bottom right foot  Neurological: Negative for weakness and numbness.    Allergies  Lisinopril; Moxifloxacin; and Sulfonamide derivatives  Home Medications   Current Outpatient Rx  Name  Route  Sig  Dispense  Refill  . ALPRAZolam (XANAX) 1 MG tablet   Oral   Take 1 mg by mouth at bedtime as needed for sleep or anxiety.         Marland Kitchen atenolol (TENORMIN) 50 MG tablet   Oral   Take 1 tablet (50 mg total) by mouth daily.   90 tablet   3   . fluticasone (FLONASE) 50 MCG/ACT nasal spray   Nasal   Place 2 sprays into the nose daily.   16 g   5   . hydrochlorothiazide (HYDRODIURIL) 25 MG tablet   Oral   Take 1 tablet (25 mg total) by mouth daily.   90 tablet   3   . Multiple Vitamin (  MULTIVITAMIN WITH MINERALS) TABS   Oral   Take 1 tablet by mouth daily.         Marland Kitchen oxyCODONE-acetaminophen (PERCOCET) 10-325 MG per tablet   Oral   Take 1 tablet by mouth every 4 (four) hours as needed for pain. For pain          Triage Vitals: BP 126/66  Pulse 63  Temp(Src) 97.9 F (36.6 C) (Oral)  Resp 22  Ht 5\' 6"  (1.676 m)  Wt 218 lb (98.884 kg)  BMI 35.2 kg/m2  SpO2 100%  Physical Exam  Nursing note and vitals reviewed. Constitutional: She appears well-developed and well-nourished.  HENT:  Head: Normocephalic and atraumatic.  Eyes: Pupils are equal, round, and reactive to light.  Neck: Normal range of motion. Neck supple.  Cardiovascular: Exam reveals no decreased pulses.   Pulses:      Dorsalis pedis pulses are 2+ on the right side, and 2+ on the left side.       Posterior tibial pulses are 2+ on the right side, and 2+ on the left side.   Musculoskeletal: She exhibits tenderness. She exhibits no edema.       Right hip: Normal.       Right knee: She exhibits swelling. She exhibits normal range of motion.       Left knee: She exhibits swelling. She exhibits normal range of motion. Tenderness found. Lateral joint line tenderness noted. No medial joint line, no MCL and no LCL tenderness noted.       Right ankle: Normal.       Right upper leg: Normal.       Right lower leg: Normal. She exhibits no tenderness, no swelling and no edema.       Right foot: She exhibits normal range of motion and no tenderness.  Neurological: She is alert. No sensory deficit.  Motor, sensation, and vascular distal to the injury is fully intact.   Skin: Skin is warm and dry.  Psychiatric: She has a normal mood and affect.    ED Course  Procedures (including critical care time)  DIAGNOSTIC STUDIES: Oxygen Saturation is 100% on room air, normal by my interpretation.    COORDINATION OF CARE:  9:47 AM - Patient verbalizes understanding and agrees with treatment plan.  10:12 AM Patient seen and examined.    Vital signs reviewed and are as follows: Filed Vitals:   05/30/13 0913  BP: 126/66  Pulse: 63  Temp: 97.9 F (36.6 C)  Resp: 22   Patient was counseled on RICE protocol and told to rest injury, use ice for no longer than 15 minutes every hour, compress the area, and elevate above the level of their heart as much as possible to reduce swelling.  Questions answered.  Patient verbalized understanding.    Pt to continue home pain medications. She has ortho with which to f/u.   Labs Review Labs Reviewed - No data to display Imaging Review No results found.  MDM   1. Knee pain, acute, right    Pain seems to be focused on knee. Worse with movement. 2+ pedal pulses. Recent history of gait change due to warts on feet. NSAIDs, RICE, ortho f/u indicated.   No sx to suggest DVT: no calf swelling or tenderness, no risk factors, pain is  intermittent (15 seconds duration) and worse with movement. Do not suspect DVT.     I personally performed the services described in this documentation, which was scribed in my presence. The  recorded information has been reviewed and is accurate.    Renne Crigler, PA-C 05/30/13 1014

## 2013-05-30 NOTE — ED Provider Notes (Signed)
Medical screening examination/treatment/procedure(s) were performed by non-physician practitioner and as supervising physician I was immediately available for consultation/collaboration.    Celene Kras, MD 05/30/13 [phone]

## 2013-05-30 NOTE — ED Notes (Signed)
Pt c/o R foot pain x 1 month that is travelling up to her R knee.  Now experiencing R knee pain and swelling.

## 2013-05-30 NOTE — ED Notes (Signed)
C/o pain on the top of right foot x 2 months. States went to her PCP who referred her to a Podiatrist. Treated her for warts on the bottom of her foot. Now also has right knee pain.

## 2013-06-15 ENCOUNTER — Ambulatory Visit (INDEPENDENT_AMBULATORY_CARE_PROVIDER_SITE_OTHER): Payer: Medicaid Other | Admitting: Family Medicine

## 2013-06-15 ENCOUNTER — Encounter: Payer: Self-pay | Admitting: Family Medicine

## 2013-06-15 VITALS — BP 138/83 | HR 59 | Temp 97.6°F | Wt 219.0 lb

## 2013-06-15 DIAGNOSIS — R609 Edema, unspecified: Secondary | ICD-10-CM

## 2013-06-15 DIAGNOSIS — R600 Localized edema: Secondary | ICD-10-CM

## 2013-06-15 DIAGNOSIS — L84 Corns and callosities: Secondary | ICD-10-CM

## 2013-06-15 MED ORDER — ALPRAZOLAM 1 MG PO TABS: 1.0000 mg | ORAL_TABLET | Freq: Every evening | ORAL | Status: DC | PRN

## 2013-06-15 NOTE — Progress Notes (Signed)
Patient ID: LAINE FONNER, female   DOB: 12/30/1960, 52 y.o.   MRN: [id number]  [facility] Lenor Provencher M. Chandra Feger, MD Phone: [phone]   Subjective: HPI: Patient is a 52 y.o. female presenting to clinic today for follow up appointment. Concerns today include leg swelling  1. Leg swelling- Went to the ED for right leg swelling. Still having some swelling, but going down with elevation/rest. Seen by Dr. Farris Has for knee (s/p replacement) who gave her cortisone injection. F/u with Dr. Farris Has in 2 weeks.   2. Wart on foot- Went to podiatry. Was cut off in his office, which is now back. He gave her OTC acid drops to use BID with duct tape. Feels a little better with softened skin, and she is able to trim the skin off. She does not want to go back to the podiatrist she saw.  History Reviewed: Former smoker. Health Maintenance: Needs flu shot  ROS: Please see HPI above.  Objective: Office vital signs reviewed. BP 138/83  Pulse 59  Temp(Src) 97.6 F (36.4 C) (Oral)  Wt 219 lb (99.338 kg)  BMI 35.36 kg/m2  Physical Examination:  General: Awake, alert. NAD HEENT: Atraumatic, normocephalic Pulm: CTAB, no wheezes Cardio: RRR, no murmurs appreciated Abdomen: obese, +BS, soft, nontender, nondistended Extremities: Trace edema. R knee with mild effusion but good ROM. Callus on right medial sole is hyperpigmented and firm with mild TTP Neuro: Grossly intact  Assessment: 52 y.o. female follow up  Plan: See Problem List and After Visit Summary

## 2013-06-15 NOTE — Patient Instructions (Addendum)
Keep the skin on your foot as soft as possible. Cut or file the skin away if possible.  Keep your legs elevated when you are at home.  Your blood pressure looks great! Keep taking your medications as needed.  Mariah [name] M. [place] Crance, M.D.

## 2013-06-16 DIAGNOSIS — R600 Localized edema: Secondary | ICD-10-CM | POA: Insufficient documentation

## 2013-06-16 NOTE — Assessment & Plan Note (Signed)
Podiatry states it is due to wart. Con't OTC treatment and keep skin soft. No need for further intervention at this time. Pt agrees.

## 2013-06-16 NOTE — Assessment & Plan Note (Signed)
Much improved. Followed by ortho. Elevate legs prn and return if it worsens. Will need routine follow up in 3 months for chronic medical problems.

## 2013-08-16 ENCOUNTER — Encounter: Payer: Self-pay | Admitting: Family Medicine

## 2013-08-16 ENCOUNTER — Ambulatory Visit (HOSPITAL_COMMUNITY)
Admission: RE | Admit: 2013-08-16 | Discharge: 2013-08-16 | Disposition: A | Discharge status: Home / Self Care | Payer: Medicaid Other | Source: Ambulatory Visit | Attending: Family Medicine | Admitting: Family Medicine

## 2013-08-16 ENCOUNTER — Other Ambulatory Visit: Payer: Self-pay | Admitting: Family Medicine

## 2013-08-16 ENCOUNTER — Ambulatory Visit (INDEPENDENT_AMBULATORY_CARE_PROVIDER_SITE_OTHER): Payer: Medicaid Other | Admitting: Family Medicine

## 2013-08-16 VITALS — BP 148/84 | HR 59 | Temp 98.1°F | Ht 66.0 in | Wt 214.0 lb

## 2013-08-16 DIAGNOSIS — L84 Corns and callosities: Secondary | ICD-10-CM

## 2013-08-16 DIAGNOSIS — M5020 Other cervical disc displacement, unspecified cervical region: Secondary | ICD-10-CM | POA: Insufficient documentation

## 2013-08-16 DIAGNOSIS — G453 Amaurosis fugax: Secondary | ICD-10-CM | POA: Insufficient documentation

## 2013-08-16 DIAGNOSIS — K219 Gastro-esophageal reflux disease without esophagitis: Secondary | ICD-10-CM

## 2013-08-16 DIAGNOSIS — I10 Essential (primary) hypertension: Secondary | ICD-10-CM

## 2013-08-16 DIAGNOSIS — H3400 Transient retinal artery occlusion, unspecified eye: Secondary | ICD-10-CM

## 2013-08-16 DIAGNOSIS — G459 Transient cerebral ischemic attack, unspecified: Secondary | ICD-10-CM | POA: Insufficient documentation

## 2013-08-16 DIAGNOSIS — Z1231 Encounter for screening mammogram for malignant neoplasm of breast: Secondary | ICD-10-CM

## 2013-08-16 LAB — LIPID PANEL
Cholesterol: 208 mg/dL — ABNORMAL HIGH (ref 0–200)
HDL: 34 mg/dL — ABNORMAL LOW (ref >39)
LDL Cholesterol: 129 mg/dL — ABNORMAL HIGH (ref 0–99)
Total CHOL/HDL Ratio: 6.1 Ratio
Triglycerides: 226 mg/dL — ABNORMAL HIGH (ref <150)
VLDL: 45 mg/dL — ABNORMAL HIGH (ref 0–40)

## 2013-08-16 LAB — BASIC METABOLIC PANEL
BUN: 9 mg/dL (ref 6–23)
CO2: 27 mEq/L (ref 19–32)
Calcium: 9.7 mg/dL (ref 8.4–10.5)
Chloride: 101 mEq/L (ref 96–112)
Creat: 0.61 mg/dL (ref 0.50–1.10)
Glucose, Bld: 113 mg/dL — ABNORMAL HIGH (ref 70–99)
Potassium: 3.9 mEq/L (ref 3.5–5.3)
Sodium: 136 mEq/L (ref 135–145)

## 2013-08-16 LAB — POCT GLYCOSYLATED HEMOGLOBIN (HGB A1C): Hemoglobin A1C: 6.1

## 2013-08-16 LAB — CBC
HCT: 38.8 % (ref 36.0–46.0)
Hemoglobin: 13.1 g/dL (ref 12.0–15.0)
MCH: 27.3 pg (ref 26.0–34.0)
MCHC: 33.8 g/dL (ref 30.0–36.0)
MCV: 81 fL (ref 78.0–100.0)
Platelets: 298 10*3/uL (ref 150–400)
RBC: 4.79 MIL/uL (ref 3.87–5.11)
RDW: 15.4 % (ref 11.5–15.5)
WBC: 9.3 10*3/uL (ref 4.0–10.5)

## 2013-08-16 LAB — TSH: TSH: 1.269 u[IU]/mL (ref 0.350–4.500)

## 2013-08-16 MED ORDER — IMIQUIMOD 5 % EX CREA: TOPICAL_CREAM | CUTANEOUS | Status: DC

## 2013-08-16 MED ORDER — ASPIRIN 325 MG PO TABS
325.0000 mg | ORAL_TABLET | Freq: Once | ORAL | Status: AC
  Administered 2013-08-16: 325 mg via ORAL

## 2013-08-16 MED ORDER — ASPIRIN EC 81 MG PO TBEC: 81.0000 mg | DELAYED_RELEASE_TABLET | Freq: Every day | ORAL | Status: DC

## 2013-08-16 NOTE — Assessment & Plan Note (Signed)
Discussed with Dr. McDiarmid. Although this happened 5 days ago, it should not be ignored especially since it is a recurrent episode. Will work up as if TIA vs. Stroke with lipid, TSH, CBC, Bmet, A1c labs today. Brain MRI ordered, as well as carotid dopplers. Will refer urgently to Opthalmology for evaluation of eye. (Acuity in clinic as well as cranial nerves were all within normal limits.)  Discussed with patient our concern, and although she is feeling well, we would prefer her have the full work up and she agrees. Will start ASA 81mg  today, await lipid panel but likely start statin as well. Given red flag symptoms to prompt immediate evaluation including any neurologic problems and patient agrees to go to ED or call 9-1-1.

## 2013-08-16 NOTE — Assessment & Plan Note (Signed)
This is a wart that is not improving. Patient refusing to go back to derm. Will start Aldara three times per week. Follow up within 1 week.

## 2013-08-16 NOTE — Patient Instructions (Addendum)
Fat and Cholesterol Control Diet  Fat and cholesterol levels in your blood and organs are influenced by your diet. High levels of fat and cholesterol may lead to diseases of the heart, small and large blood vessels, gallbladder, liver, and pancreas.  CONTROLLING FAT AND CHOLESTEROL WITH DIET  Although exercise and lifestyle factors are important, your diet is key. That is because certain foods are known to raise cholesterol and others to lower it. The goal is to balance foods for their effect on cholesterol and more importantly, to replace saturated and trans fat with other types of fat, such as monounsaturated fat, polyunsaturated fat, and omega-3 fatty acids.  On average, a person should consume no more than 15 to 17 g of saturated fat daily. Saturated and trans fats are considered "bad" fats, and they will raise LDL cholesterol. Saturated fats are primarily found in animal products such as meats, butter, and cream. However, that does not mean you need to give up all your favorite foods. Today, there are good tasting, low-fat, low-cholesterol substitutes for most of the things you like to eat. Choose low-fat or nonfat alternatives. Choose round or loin cuts of red meat. These types of cuts are lowest in fat and cholesterol. Chicken (without the skin), fish, veal, and ground turkey breast are great choices. Eliminate fatty meats, such as hot dogs and salami. Even shellfish have little or no saturated fat. Have a 3 oz (85 g) portion when you eat lean meat, poultry, or fish.  Trans fats are also called "partially hydrogenated oils." They are oils that have been scientifically manipulated so that they are solid at room temperature resulting in a longer shelf life and improved taste and texture of foods in which they are added. Trans fats are found in stick margarine, some tub margarines, cookies, crackers, and baked goods.   When baking and cooking, oils are a great substitute for butter. The monounsaturated oils are  especially beneficial since it is believed they lower LDL and raise HDL. The oils you should avoid entirely are saturated tropical oils, such as coconut and palm.   Remember to eat a lot from food groups that are naturally free of saturated and trans fat, including fish, fruit, vegetables, beans, grains (barley, rice, couscous, bulgur wheat), and pasta (without cream sauces).   IDENTIFYING FOODS THAT LOWER FAT AND CHOLESTEROL   Soluble fiber may lower your cholesterol. This type of fiber is found in fruits such as apples, vegetables such as broccoli, potatoes, and carrots, legumes such as beans, peas, and lentils, and grains such as barley. Foods fortified with plant sterols (phytosterol) may also lower cholesterol. You should eat at least 2 g per day of these foods for a cholesterol lowering effect.   Read package labels to identify low-saturated fats, trans fat free, and low-fat foods at the supermarket. Select cheeses that have only 2 to 3 g saturated fat per ounce. Use a heart-healthy tub margarine that is free of trans fats or partially hydrogenated oil. When buying baked goods (cookies, crackers), avoid partially hydrogenated oils. Breads and muffins should be made from whole grains (whole-wheat or whole oat flour, instead of "flour" or "enriched flour"). Buy non-creamy canned soups with reduced salt and no added fats.   FOOD PREPARATION TECHNIQUES   Never deep-fry. If you must fry, either stir-fry, which uses very little fat, or use non-stick cooking sprays. When possible, broil, bake, or roast meats, and steam vegetables. Instead of putting butter or margarine on vegetables, use lemon   and herbs, applesauce, and cinnamon (for squash and sweet potatoes). Use nonfat yogurt, salsa, and low-fat dressings for salads.   LOW-SATURATED FAT / LOW-FAT FOOD SUBSTITUTES  Meats / Saturated Fat (g)  · Avoid: Steak, marbled (3 oz/85 g) / 11 g  · Choose: Steak, lean (3 oz/85 g) / 4 g  · Avoid: Hamburger (3 oz/85 g) / 7  g  · Choose: Hamburger, lean (3 oz/85 g) / 5 g  · Avoid: Ham (3 oz/85 g) / 6 g  · Choose: Ham, lean cut (3 oz/85 g) / 2.4 g  · Avoid: Chicken, with skin, dark meat (3 oz/85 g) / 4 g  · Choose: Chicken, skin removed, dark meat (3 oz/85 g) / 2 g  · Avoid: Chicken, with skin, light meat (3 oz/85 g) / 2.5 g  · Choose: Chicken, skin removed, light meat (3 oz/85 g) / 1 g  Dairy / Saturated Fat (g)  · Avoid: Whole milk (1 cup) / 5 g  · Choose: Low-fat milk, 2% (1 cup) / 3 g  · Choose: Low-fat milk, 1% (1 cup) / 1.5 g  · Choose: Skim milk (1 cup) / 0.3 g  · Avoid: Hard cheese (1 oz/28 g) / 6 g  · Choose: Skim milk cheese (1 oz/28 g) / 2 to 3 g  · Avoid: Cottage cheese, 4% fat (1 cup) / 6.5 g  · Choose: Low-fat cottage cheese, 1% fat (1 cup) / 1.5 g  · Avoid: Ice cream (1 cup) / 9 g  · Choose: Sherbet (1 cup) / 2.5 g  · Choose: Nonfat frozen yogurt (1 cup) / 0.3 g  · Choose: Frozen fruit bar / trace  · Avoid: Whipped cream (1 tbs) / 3.5 g  · Choose: Nondairy whipped topping (1 tbs) / 1 g  Condiments / Saturated Fat (g)  · Avoid: Mayonnaise (1 tbs) / 2 g  · Choose: Low-fat mayonnaise (1 tbs) / 1 g  · Avoid: Butter (1 tbs) / 7 g  · Choose: Extra light margarine (1 tbs) / 1 g  · Avoid: Coconut oil (1 tbs) / 11.8 g  · Choose: Olive oil (1 tbs) / 1.8 g  · Choose: Corn oil (1 tbs) / 1.7 g  · Choose: Safflower oil (1 tbs) / 1.2 g  · Choose: Sunflower oil (1 tbs) / 1.4 g  · Choose: Soybean oil (1 tbs) / 2.4 g  · Choose: Canola oil (1 tbs) / 1 g  Document Released: 09/01/2005 Document Revised: 12/27/2012 Document Reviewed: 02/20/2011  [employer]® Patient Information ©2014 [employer].

## 2013-08-16 NOTE — Progress Notes (Signed)
Patient ID: Mariah [name], female   DOB: 12/20/1960, 52 y.o.   MRN: [id number]    Subjective: HPI: Patient is a 52 y.o. female presenting to clinic today for follow up appointment. Concerns today include foot pain, and mention of vision loss.  1. Callus- Was seen by podiatry many months ago, who shaved it down she has used acid drop since then. Now its sore around the callus. She is doing well keeping it moist but the pain is getting much worse. She states it gets swollen and makes it difficult for her to walk. She does not file down the hard callus due to pain.  2. Vision changes- She reports a "dark shade coming down over her left eye." This happened 5 days ago, and lasted about 30 minutes. She states she was seeing dark spots out of the eye. It resolved and has not happened since then. She states this happened before a few years ago and she went to the ED, but her workup was unremarkable. Last time she saw eye doctor was 3-4 years ago. No numbness/tingling, no new headaches. No blurred vision. She does not take an aspirin or statin.  History Reviewed: Former smoker. Health Maintenance: Declines flu shot.  ROS: Please see HPI above.  Objective: Office vital signs reviewed. BP 148/84  Pulse 59  Temp(Src) 98.1 F (36.7 C) (Oral)  Ht 5\' 6"  (1.676 m)  Wt 214 lb (97.07 kg)  BMI 34.56 kg/m2  Physical Examination:  General: Awake, alert. NAD. Pleasant and happy HEENT: Atraumatic, normocephalic. MMM. RR appreciated bilaterally, fundoscopy unremarkable on non-dilated eyes. Neck: No masses palpated. No LAD Pulm: CTAB, no wheezes Cardio: RRR, no murmurs appreciated Extremities: No edema. Moves all extremities. Neuro: Grossly intact. CN 2-12 intact without focal deficit  Assessment: 52 y.o. female follow up  Plan: See Problem List and After Visit Summary

## 2013-08-17 ENCOUNTER — Ambulatory Visit (HOSPITAL_COMMUNITY)
Admission: RE | Admit: 2013-08-17 | Discharge: 2013-08-17 | Disposition: A | Discharge status: Home / Self Care | Payer: Medicaid Other | Source: Ambulatory Visit | Attending: Family Medicine | Admitting: Family Medicine

## 2013-08-17 DIAGNOSIS — H3400 Transient retinal artery occlusion, unspecified eye: Secondary | ICD-10-CM | POA: Insufficient documentation

## 2013-08-17 DIAGNOSIS — I059 Rheumatic mitral valve disease, unspecified: Secondary | ICD-10-CM | POA: Insufficient documentation

## 2013-08-17 DIAGNOSIS — I6529 Occlusion and stenosis of unspecified carotid artery: Secondary | ICD-10-CM | POA: Insufficient documentation

## 2013-08-17 DIAGNOSIS — I658 Occlusion and stenosis of other precerebral arteries: Secondary | ICD-10-CM | POA: Insufficient documentation

## 2013-08-17 DIAGNOSIS — I341 Nonrheumatic mitral (valve) prolapse: Secondary | ICD-10-CM

## 2013-08-17 DIAGNOSIS — G453 Amaurosis fugax: Secondary | ICD-10-CM

## 2013-08-17 NOTE — Progress Notes (Signed)
Bilateral carotid artery duplex:  1-39% ICA stenosis.  Vertebral artery flow is antegrade.     

## 2013-08-22 ENCOUNTER — Encounter: Payer: Self-pay | Admitting: Family Medicine

## 2013-08-22 NOTE — Progress Notes (Signed)
Patient updated on results. No acute stroke on MRI. Carotid normal. Elevated cholesterol, discussed medication and she does not want to take it at this time. Will do diet and exercise.  Mariah [name] M. Leonell Lobdell, M.D.

## 2013-08-29 ENCOUNTER — Ambulatory Visit (INDEPENDENT_AMBULATORY_CARE_PROVIDER_SITE_OTHER): Payer: Medicaid Other | Admitting: Family Medicine

## 2013-08-29 NOTE — Progress Notes (Signed)
Patient ID: Mariah [name], female   DOB: Jan 05, 1961, 52 y.o.   MRN: [id number]  Entered in error - Patient rescheduled appointment.

## 2013-08-29 NOTE — Progress Notes (Deleted)
Patient ID: MYKAILA BLUNCK, female   DOB: 1961-02-14, 52 y.o.   MRN: [id number]    Subjective: HPI: Patient is a 52 y.o. female presenting to clinic today for follow up appointment. She was seen a few weeks ago with amaurosis fugax; had a full work up at that time and was updated on the results. Following up today on her symptoms.   History Reviewed: *** smoker. Health Maintenance: ***  ROS: Please see HPI above.  Objective: Office vital signs reviewed. There were no vitals taken for this visit.  Physical Examination:  General: Awake, alert. NAD HEENT: Atraumatic, normocephalic Neck: No masses palpated. No LAD Pulm: CTAB, no wheezes Cardio: RRR, no murmurs appreciated Abdomen:+BS, soft, nontender, nondistended Extremities: No edema Neuro: Grossly intact  Assessment: 52 y.o. female ***  Plan: See Problem List and After Visit Summary

## 2013-09-02 ENCOUNTER — Ambulatory Visit: Payer: Self-pay | Admitting: Family Medicine

## 2013-09-05 ENCOUNTER — Ambulatory Visit: Payer: Self-pay | Admitting: Family Medicine

## 2013-09-12 ENCOUNTER — Ambulatory Visit: Payer: Self-pay | Admitting: Family Medicine

## 2013-09-23 ENCOUNTER — Ambulatory Visit
Admission: RE | Admit: 2013-09-23 | Discharge: 2013-09-23 | Disposition: A | Discharge status: Home / Self Care | Payer: Medicaid Other | Source: Ambulatory Visit | Attending: Family Medicine | Admitting: Family Medicine

## 2013-09-23 DIAGNOSIS — Z1231 Encounter for screening mammogram for malignant neoplasm of breast: Secondary | ICD-10-CM

## 2013-09-26 ENCOUNTER — Telehealth: Payer: Self-pay | Admitting: Family Medicine

## 2013-09-26 DIAGNOSIS — R600 Localized edema: Secondary | ICD-10-CM

## 2013-09-26 NOTE — Telephone Encounter (Signed)
Patient has been a patient at [facility] for years but now they are requiring a referral from us. Patient has an appt there on 1/9 at 9 am, She has also made an appt with [facility] for 1/21 at 9:45 am.

## 2013-09-27 NOTE — Telephone Encounter (Signed)
NPI  number given. Mariah [name], Mariah [name]

## 2013-09-27 NOTE — Telephone Encounter (Signed)
New referral ordered.  I assume this is still for her leg edema, but it may be for her shoulder. If I need to change diagnosis, please let me know  Thanks! Casara Perrier M. Griffin Dewilde, M.D.

## 2013-09-27 NOTE — Telephone Encounter (Signed)
Will forward to MD for approval to call and give NPI number (as I assume this is what they need). Mariah [name], Mariah [name]

## 2013-10-05 ENCOUNTER — Encounter: Payer: Self-pay | Admitting: Family Medicine

## 2013-10-05 ENCOUNTER — Ambulatory Visit (INDEPENDENT_AMBULATORY_CARE_PROVIDER_SITE_OTHER): Payer: Medicaid Other | Admitting: Family Medicine

## 2013-10-05 VITALS — BP 134/81 | HR 56 | Temp 98.4°F | Wt 212.0 lb

## 2013-10-05 DIAGNOSIS — G453 Amaurosis fugax: Secondary | ICD-10-CM

## 2013-10-05 DIAGNOSIS — H8110 Benign paroxysmal vertigo, unspecified ear: Secondary | ICD-10-CM

## 2013-10-05 DIAGNOSIS — J309 Allergic rhinitis, unspecified: Secondary | ICD-10-CM

## 2013-10-05 DIAGNOSIS — H3400 Transient retinal artery occlusion, unspecified eye: Secondary | ICD-10-CM

## 2013-10-05 MED ORDER — FLUTICASONE PROPIONATE 50 MCG/ACT NA SUSP: 2.0000 | Freq: Every day | NASAL | Status: DC

## 2013-10-05 MED ORDER — ALPRAZOLAM 1 MG PO TABS: 1.0000 mg | ORAL_TABLET | Freq: Every evening | ORAL | Status: DC | PRN

## 2013-10-06 DIAGNOSIS — H8110 Benign paroxysmal vertigo, unspecified ear: Secondary | ICD-10-CM | POA: Insufficient documentation

## 2013-10-06 NOTE — Assessment & Plan Note (Signed)
Description most consistent with BPPV. Only lasting seconds. She does have allergies and sinus issues. Con't to use Flonase (refilled today) and montior symptoms. If worsens, she will let me know. I have given her a handout with epley maneuver to use at home as needed as well.

## 2013-10-06 NOTE — Progress Notes (Signed)
Patient ID: Mariah CornwallCynthia Y [name], female   DOB: 04-Mar-1961, 53 y.o.   MRN: [id number]    Subjective: HPI: Patient is a 53 y.o. female presenting to clinic today for follow up and medication refills. Concerns today include dizziness  1. Vision change- No further episodes. Her work up was negative for stroke. No numbness, tingling, weakness or syncope. She states she feels well with no concerns other than her dizziness. Started cholesterol medicine based on elevated LDL noted in work up. Tolerating well.  2. Dizziness- The symptoms started several weeks ago and have been intermittent. The attacks occur occasionally and last a few seconds. Positions that worsen symptoms: any motion. Previous workup/treatments: none. Associated ear symptoms: none. Associated CNS symptoms: none. Recent infections: none. Head trauma: denied. Drug ingestion: none. Noise exposure: no occupational exposure. Family history: none.  History Reviewed: Former smoker. Health Maintenance: Needs colonoscopy and pap  ROS: Please see HPI above.  Objective: Office vital signs reviewed. BP 134/81  Pulse 56  Temp(Src) 98.4 F (36.9 C) (Oral)  Wt 212 lb (96.163 kg)  Physical Examination:  General: Awake, alert. NAD HEENT: Atraumatic, normocephalic. MMM. Neck: No masses palpated. No LAD Pulm: CTAB, no wheezes Cardio: RRR, no murmurs appreciated Abdomen:+BS, soft, nontender, nondistended Extremities: No edema. Moves all extremities Neuro: Grossly intact. No deficits. No nystagmus  Assessment: 53 y.o. female follow up  Plan: See Problem List and After Visit Summary

## 2013-10-06 NOTE — Assessment & Plan Note (Signed)
Resolved. Cont ASA and statin. Given red flags that should prompt her return. Pt agrees.

## 2013-12-15 ENCOUNTER — Telehealth: Payer: Self-pay | Admitting: Family Medicine

## 2013-12-15 DIAGNOSIS — M2550 Pain in unspecified joint: Secondary | ICD-10-CM

## 2013-12-15 NOTE — Telephone Encounter (Signed)
Has appt with Dr Nicholes CalamityJames Kramer-orthopaedic specialist She needs a referral to make an appt with him Could she have a standing order with him? Please advise

## 2013-12-15 NOTE — Telephone Encounter (Signed)
Referral faxed to [facility].  [phone].  Konstantinos Cordoba,CMA

## 2013-12-15 NOTE — Telephone Encounter (Signed)
All I can do is place a referral in the system. I do not know if this is a standing order, it depends on her insurance.  Order placed.  Kindell Strada M. Matayah Reyburn, M.D.

## 2013-12-28 ENCOUNTER — Ambulatory Visit: Payer: Self-pay | Admitting: Family Medicine

## 2014-02-13 ENCOUNTER — Ambulatory Visit (INDEPENDENT_AMBULATORY_CARE_PROVIDER_SITE_OTHER): Payer: Medicaid Other | Admitting: Family Medicine

## 2014-02-13 ENCOUNTER — Encounter: Payer: Self-pay | Admitting: Family Medicine

## 2014-02-13 ENCOUNTER — Other Ambulatory Visit: Payer: Self-pay | Admitting: *Deleted

## 2014-02-13 VITALS — BP 142/85 | HR 61 | Temp 98.4°F | Ht 66.0 in | Wt 203.0 lb

## 2014-02-13 DIAGNOSIS — I10 Essential (primary) hypertension: Secondary | ICD-10-CM

## 2014-02-13 DIAGNOSIS — L84 Corns and callosities: Secondary | ICD-10-CM

## 2014-02-13 MED ORDER — ESOMEPRAZOLE MAGNESIUM 40 MG PO CPDR: 40.0000 mg | DELAYED_RELEASE_CAPSULE | Freq: Every day | ORAL | Status: DC

## 2014-02-13 MED ORDER — CYCLOBENZAPRINE HCL 5 MG PO TABS: 5.0000 mg | ORAL_TABLET | Freq: Three times a day (TID) | ORAL | Status: DC | PRN

## 2014-02-13 MED ORDER — ALPRAZOLAM 1 MG PO TABS: 1.0000 mg | ORAL_TABLET | Freq: Every evening | ORAL | Status: DC | PRN

## 2014-02-13 NOTE — Patient Instructions (Signed)
Congratulations on your weight loss!  I will see you back Wed at 9:30. Ask them to schedule that at the front desk.  I sent your refills to the pharmacy.  I will try to find you a foot doctor.  Aarna Mihalko M. Teddrick Mallari, M.D.

## 2014-02-13 NOTE — Assessment & Plan Note (Signed)
>>  ASSESSMENT AND PLAN FOR HYPERTENSION, BENIGN WRITTEN ON 02/13/2014 12:28 PM BY HAIRFORD, AMBER M, MD  A: BP above goal but not on medication. No red flags on history or exam  P: - Restart medications - Check BP at drug store - F/u within 1-2 months, or sooner if needed

## 2014-02-13 NOTE — Assessment & Plan Note (Signed)
A: BP above goal but not on medication. No red flags on history or exam  P: - Restart medications - Check BP at drug store - F/u within 1-2 months, or sooner if needed

## 2014-02-13 NOTE — Progress Notes (Signed)
Patient ID: Mariah [name], female   DOB: 20-Oct-1960, 53 y.o.   MRN: [id number]    Subjective: HPI: Patient is a 53 y.o. female presenting to clinic today for follow up appointment. Concerns today include refills  1. Plantar warts Saw podiatry once, still keeping it moist and covered. She states her pain is severe. She does not want to see the same doctor as before because he hurt her when he cut the last wart. She wears tennis shoes all the time. No improvement.  2. Hypertension Blood pressure today: 142/85 Taking Meds: Has not had BP pills for last 3 days.  Side effects: No. But patient states she has been more stressed with her mother in hospice ROS: Denies headache, visual changes, nausea, vomiting, chest pain, abdominal pain or shortness of breath.  3. Medications Needs refills on Nexium, Xanax and Flexeril. Stable on all of these medications  History Reviewed: Former smoker. Health Maintenance: UTD  ROS: Please see HPI above.  Objective: Office vital signs reviewed. BP 142/85  Pulse 61  Temp(Src) 98.4 F (36.9 C) (Oral)  Ht 5\' 6"  (1.676 m)  Wt 203 lb (92.08 kg)  BMI 32.78 kg/m2  Physical Examination:  General: Awake, alert. NAD HEENT: Atraumatic, normocephalic Neck: No masses palpated. No LAD Pulm: CTAB, no wheezes Cardio: RRR, no murmurs appreciated Abdomen:+BS, soft, nontender, nondistended Extremities: No edema. Right foot with hyperpigmented thickened calluses at base of 3rd toe and arch of foot.  Neuro: Grossly intact  Assessment: 53 y.o. female follow up  Plan: See Problem List and After Visit Summary

## 2014-02-13 NOTE — Assessment & Plan Note (Signed)
A: Callus formation due to plantar warts. Getting progressively worse and more painful.  P: - Con't to keep it covered and moist. File down skin if gets too thick - Will refer back to podiatry.  - F/u as needed

## 2014-02-15 ENCOUNTER — Ambulatory Visit: Payer: Self-pay | Admitting: Family Medicine

## 2014-02-21 ENCOUNTER — Encounter: Payer: Self-pay | Admitting: Family Medicine

## 2014-02-21 ENCOUNTER — Other Ambulatory Visit (HOSPITAL_COMMUNITY)
Admission: RE | Admit: 2014-02-21 | Discharge: 2014-02-21 | Disposition: A | Discharge status: Home / Self Care | Payer: Medicaid Other | Source: Ambulatory Visit | Attending: Family Medicine | Admitting: Family Medicine

## 2014-02-21 ENCOUNTER — Ambulatory Visit (INDEPENDENT_AMBULATORY_CARE_PROVIDER_SITE_OTHER): Payer: Medicaid Other | Admitting: Family Medicine

## 2014-02-21 VITALS — BP 151/94 | HR 69 | Ht 66.0 in | Wt 197.0 lb

## 2014-02-21 DIAGNOSIS — N898 Other specified noninflammatory disorders of vagina: Secondary | ICD-10-CM | POA: Insufficient documentation

## 2014-02-21 DIAGNOSIS — J309 Allergic rhinitis, unspecified: Secondary | ICD-10-CM

## 2014-02-21 DIAGNOSIS — M549 Dorsalgia, unspecified: Secondary | ICD-10-CM

## 2014-02-21 DIAGNOSIS — Z113 Encounter for screening for infections with a predominantly sexual mode of transmission: Secondary | ICD-10-CM | POA: Insufficient documentation

## 2014-02-21 DIAGNOSIS — B070 Plantar wart: Secondary | ICD-10-CM

## 2014-02-21 DIAGNOSIS — Z20828 Contact with and (suspected) exposure to other viral communicable diseases: Secondary | ICD-10-CM

## 2014-02-21 LAB — RPR

## 2014-02-21 MED ORDER — PREDNISONE 20 MG PO TABS: 40.0000 mg | ORAL_TABLET | Freq: Every day | ORAL | Status: DC

## 2014-02-21 NOTE — Patient Instructions (Signed)
Take prednisone for the next 5 days. Use flonase every day, no matter how you feel.  We will call you if anything comes back. No news is good news.  Mariah [name] M. Daryll Spisak, M.D.

## 2014-02-21 NOTE — Assessment & Plan Note (Signed)
A: STD screen.  P: - GC/CT, Wet prep, HIV and RPR sent - Will let her know results.

## 2014-02-21 NOTE — Progress Notes (Signed)
Patient ID: Mariah [name], female   DOB: December 12, 1960, 53 y.o.   MRN: [id number]    Subjective: HPI: Patient is a 53 y.o. female presenting to clinic today for same day appointment. Concerns today include back pain and sinus pain  1. BACK PAIN Location: Lower back Quality: Achy Onset: 3-4 days Worse with: Exertional activity  Better with: Rest and pain medication  Radiation: None Trauma: None Followed by Ortho. Had surgery by Dr. Joanna Hews Flags Fecal/urinary incontinence: no  Numbness/Weakness: no  Fever/chills/sweats: no   Unexplained weight loss: no  No relief with bedrest: no  h/o cancer/immunosuppression: no  IV drug use: no    2. SINUS PROBLEM Feels pain when lying down. Has drainage out of nose and down back of throat. Worse at night. In the mornings, takes about 35 minutes to get mucus cleared. Coughs so hard it hurts her back. No fevers, drainage is clear. Has history of allergic rhinitis. Using Flonase, nasal saline and Zyrtec. Has some associated SOB with cough.    3. STD CHECK Patient had condom break. Would like STD screen. S/p hysterectomy, no chance of pregnancy. No abdominal pain, no discharge, no odor, no dysuria, no fevers.  History Reviewed: Former smoker.   ROS: Please see HPI above.   Objective: Office vital signs reviewed. BP 151/94  Pulse 69  Ht 5\' 6"  (1.676 m)  Wt 197 lb (89.359 kg)  BMI 31.81 kg/m2  SpO2 95%  Physical Examination:  General: Awake, alert. NAD HEENT: Atraumatic, normocephalic. MMM. Nasal mucosal edema and erythema. Pulm: CTAB, no wheezes Cardio: RRR, no murmurs appreciated Back: Midline scar. Mild TTP. Good ROM. Abdomen:+BS, soft, nontender, nondistended GU: Normal external genitalia. No discharge appreciated. Cervix surgically absent. Extremities: No edema. Neuro: Gait normal. Strength and sensation lower extremities.  Assessment: 53 y.o. female follow up  Plan: See Problem List and After Visit Summary

## 2014-02-21 NOTE — Assessment & Plan Note (Signed)
A: Acute on chronic back pain without known inciting event  P: - Continue to be active - Prednisone 40mg  x5 days - Declined Xray - Encouraged to follow up with ortho since she had surgery in the past

## 2014-02-21 NOTE — Assessment & Plan Note (Signed)
A: Worsening. Most likely cause of sinus pain rather than sinusitis.  P: - Con't zyrtec - Flonase daily - Prednisone given for back may help  - F/u prn

## 2014-02-22 LAB — HIV ANTIBODY (ROUTINE TESTING W REFLEX): HIV 1&2 Ab, 4th Generation: NONREACTIVE

## 2014-02-28 ENCOUNTER — Ambulatory Visit: Payer: Self-pay

## 2014-03-07 ENCOUNTER — Ambulatory Visit: Payer: Self-pay

## 2014-03-24 ENCOUNTER — Ambulatory Visit: Payer: Self-pay

## 2014-04-03 ENCOUNTER — Other Ambulatory Visit: Payer: Self-pay | Admitting: Family Medicine

## 2014-04-03 NOTE — Telephone Encounter (Signed)
Refills given. Patient should be advised to return for follow-up visit in the next month for her blood pressure.

## 2014-04-03 NOTE — Telephone Encounter (Signed)
Pt informed.  She is actually in clinic today with her boyfriend.  She will schedule appt before she leaves today. [name], Mariah [name]

## 2014-04-13 ENCOUNTER — Ambulatory Visit: Payer: Self-pay | Admitting: Family Medicine

## 2014-05-17 ENCOUNTER — Telehealth: Payer: Self-pay | Admitting: Family Medicine

## 2014-05-17 NOTE — Telephone Encounter (Signed)
Pt called because when she was out of town she had her Xanax filled at a [facility]. The problem now is she still has one refill on it and the [facility] here will not transfer back to her primary location. [facility] said that the doctor would have to call them and okay this. jw

## 2014-05-18 NOTE — Telephone Encounter (Signed)
Could [facility] please [facility] for me an give a verbal order that [facility]. Thanks.

## 2014-05-18 NOTE — Telephone Encounter (Signed)
Have attempted to call several time throughout the afternoon.  Every time receiving a busy signal.  Will try again tomorrow. Ciena Sampley, Maryjo [facility]

## 2014-05-18 NOTE — Telephone Encounter (Signed)
Please send to [facility] [address]

## 2014-05-18 NOTE — Telephone Encounter (Signed)
Patient calling again asking about rx

## 2014-05-19 ENCOUNTER — Ambulatory Visit: Payer: Self-pay | Admitting: Family Medicine

## 2014-05-19 NOTE — Telephone Encounter (Signed)
[facility] informed it was ok to fill rx, pt informed. Daffney Greenly, Maryjo [facility]

## 2014-05-19 NOTE — Telephone Encounter (Signed)
LM for [facility] with message from MD.  Asked them to call us back to confirm they received message. Richele Strand,CMA

## 2014-05-26 ENCOUNTER — Ambulatory Visit: Payer: Self-pay | Admitting: Family Medicine

## 2014-06-09 ENCOUNTER — Ambulatory Visit: Payer: Self-pay | Admitting: Family Medicine

## 2014-06-14 ENCOUNTER — Ambulatory Visit (INDEPENDENT_AMBULATORY_CARE_PROVIDER_SITE_OTHER): Payer: Medicaid Other | Admitting: Family Medicine

## 2014-06-14 ENCOUNTER — Encounter: Payer: Self-pay | Admitting: Family Medicine

## 2014-06-14 VITALS — BP 118/69 | HR 61 | Temp 98.1°F | Ht 66.0 in | Wt 200.4 lb

## 2014-06-14 DIAGNOSIS — H8110 Benign paroxysmal vertigo, unspecified ear: Secondary | ICD-10-CM

## 2014-06-14 NOTE — Progress Notes (Signed)
    Subjective   Mariah CornwallCynthia Y [name] is a 53 y.o. female that presents for a same day visit  Vertigo: This is a recurrent problem. Symptoms started one week ago. Symptoms are intermittent. Symptoms resolve after about a minute after onset. No neurological symptoms associated. She last had similar symptoms earlier this year but she does not know what improved her symptoms.   History  Substance Use Topics  . Smoking status: Former Smoker    Quit date: 09/16/1995  . Smokeless tobacco: Never Used  . Alcohol Use: Yes    ROS Per HPI  Objective   BP 118/69  Pulse 61  Temp(Src) 98.1 F (36.7 C) (Oral)  Ht 5\' 6"  (1.676 m)  Wt 200 lb 6.4 oz (90.901 kg)  BMI 32.36 kg/m2   General: Well appearing, in no distress HEENT: No nystagmus. Dix-hallpike performed without nystagmus noted, however, symptoms reproduced. Neuro: Cranial nerves intact. No facial asymmetry  5/5 strength of upper/lower extremities.  Assessment and Plan   Please refer to problem based charting of assessment and plan

## 2014-06-14 NOTE — Assessment & Plan Note (Signed)
Similar to last episode. Will provide information for Epley maneuvers. Follow-up if symptoms worsen.

## 2014-06-14 NOTE — Patient Instructions (Addendum)
Thank you for coming to see me today. It was a pleasure. Today we talked about:   Vertigo: This seems to be a recurrence of your symptoms from earlier this year. I have given you some information for maneuvers to help with your symptoms.  Please make an appointment to see Dr. Birdie Sons for follow-up.  If you have any questions or concerns, please do not hesitate to call the office at [phone].  Sincerely,  Jacquelin Hawking, MD   Epley Maneuver Self-Care WHAT IS THE EPLEY MANEUVER? The Epley maneuver is an exercise you can do to relieve symptoms of benign paroxysmal positional vertigo (BPPV). This condition is often just referred to as vertigo. BPPV is caused by the movement of tiny crystals (canaliths) inside your inner ear. The accumulation and movement of canaliths in your inner ear causes a sudden spinning sensation (vertigo) when you move your head to certain positions. Vertigo usually lasts about 30 seconds. BPPV usually occurs in just one ear. If you get vertigo when you lie on your left side, you probably have BPPV in your left ear. Your health care provider can tell you which ear is involved.  BPPV may be caused by a head injury. Many people older than 50 get BPPV for unknown reasons. If you have been diagnosed with BPPV, your health care provider may teach you how to do this maneuver. BPPV is not life threatening (benign) and usually goes away in time.  WHEN SHOULD I PERFORM THE EPLEY MANEUVER? You can do this maneuver at home whenever you have symptoms of vertigo. You may do the Epley maneuver up to 3 times a day until your symptoms of vertigo go away. HOW SHOULD I DO THE EPLEY MANEUVER? 1. Sit on the edge of a bed or table with your back straight. Your legs should be extended or hanging over the edge of the bed or table.  2. Turn your head halfway toward the affected ear.  3. Lie backward quickly with your head turned until you are lying flat on your back. You may want to  position a pillow under your shoulders.  4. Hold this position for 30 seconds. You may experience an attack of vertigo. This is normal. Hold this position until the vertigo stops. 5. Then turn your head to the opposite direction until your unaffected ear is facing the floor.  6. Hold this position for 30 seconds. You may experience an attack of vertigo. This is normal. Hold this position until the vertigo stops. 7. Now turn your whole body to the same side as your head. Hold for another 30 seconds.  8. You can then sit back up. ARE THERE RISKS TO THIS MANEUVER? In some cases, you may have other symptoms (such as changes in your vision, weakness, or numbness). If you have these symptoms, stop doing the maneuver and call your health care provider. Even if doing these maneuvers relieves your vertigo, you may still have dizziness. Dizziness is the sensation of light-headedness but without the sensation of movement. Even though the Epley maneuver may relieve your vertigo, it is possible that your symptoms will return within 5 years. WHAT SHOULD I DO AFTER THIS MANEUVER? After doing the Epley maneuver, you can return to your normal activities. Ask your doctor if there is anything you should do at home to prevent vertigo. This may include:  Sleeping with two or more pillows to keep your head elevated.  Not sleeping on the side of your affected ear.  Getting  up slowly from bed.  Avoiding sudden movements during the day.  Avoiding extreme head movement, like looking up or bending over.  Wearing a cervical collar to prevent sudden head movements. WHAT SHOULD I DO IF MY SYMPTOMS GET WORSE? Call your health care provider if your vertigo gets worse. Call your provider right way if you have other symptoms, including:   Nausea.  Vomiting.  Headache.  Weakness.  Numbness.  Vision changes. Document Released: 09/06/2013 Document Reviewed: 09/06/2013 [facility] Patient Information 2015 [employer]  [place]. This information is not intended to replace advice given to you by your health care provider. Make sure you discuss any questions you have with your health care provider.

## 2014-06-21 ENCOUNTER — Telehealth: Payer: Self-pay | Admitting: Family Medicine

## 2014-06-21 DIAGNOSIS — M5136 Other intervertebral disc degeneration, lumbar region: Secondary | ICD-10-CM

## 2014-06-21 NOTE — Telephone Encounter (Signed)
Referral placed for ortho appointment tomorrow.

## 2014-06-21 NOTE — Telephone Encounter (Signed)
Patient need referral to Dr. Blenda BridegroomKramer's office for her appt tomorrow at 4:15.  Also would like to have a standing referral placed in her record so she won't have to keep calling for one.

## 2014-06-21 NOTE — Telephone Encounter (Signed)
Referral given.  Pt informed.  Dr. De NurseSonneberg,  Pt is requesting an appointment with you to discuss her vertigo (having to pull off the side of the road when driving) and to get med refills (alprazolam).  You have nothing until 07/17/14 and other MDs are limited as well, other than same day (pt is aware that this appointment would not be appropriate from same day).  Please advise. Mariah [name], Mariah [name]

## 2014-06-21 NOTE — Telephone Encounter (Signed)
Scheduled with Dr Allene DillonKaramaelegos. [name], Maryjo RochesterJessica Dawn

## 2014-06-21 NOTE — Telephone Encounter (Signed)
She needs to be seen in the office for both of those issues. She can see anyone that is available prior to then.

## 2014-06-21 NOTE — Telephone Encounter (Signed)
Will forward to MD to place ortho referral.  [facility]  [phone]. [name],CMA

## 2014-06-27 ENCOUNTER — Ambulatory Visit: Payer: Self-pay | Admitting: Family Medicine

## 2014-07-03 ENCOUNTER — Ambulatory Visit (INDEPENDENT_AMBULATORY_CARE_PROVIDER_SITE_OTHER): Payer: Medicaid Other | Admitting: Family Medicine

## 2014-07-03 ENCOUNTER — Encounter: Payer: Self-pay | Admitting: Family Medicine

## 2014-07-03 VITALS — BP 123/60 | HR 60 | Temp 98.4°F | Resp 18 | Wt 205.0 lb

## 2014-07-03 DIAGNOSIS — F411 Generalized anxiety disorder: Secondary | ICD-10-CM

## 2014-07-03 DIAGNOSIS — H8110 Benign paroxysmal vertigo, unspecified ear: Secondary | ICD-10-CM

## 2014-07-03 NOTE — Patient Instructions (Signed)
Epley Maneuver Self-Care WHAT IS THE EPLEY MANEUVER? The Epley maneuver is an exercise you can do to relieve symptoms of benign paroxysmal positional vertigo (BPPV). This condition is often just referred to as vertigo. BPPV is caused by the movement of tiny crystals (canaliths) inside your inner ear. The accumulation and movement of canaliths in your inner ear causes a sudden spinning sensation (vertigo) when you move your head to certain positions. Vertigo usually lasts about 30 seconds. BPPV usually occurs in just one ear. If you get vertigo when you lie on your left side, you probably have BPPV in your left ear. Your health care provider can tell you which ear is involved.  BPPV may be caused by a head injury. Many people older than 50 get BPPV for unknown reasons. If you have been diagnosed with BPPV, your health care provider may teach you how to do this maneuver. BPPV is not life threatening (benign) and usually goes away in time.  WHEN SHOULD I PERFORM THE EPLEY MANEUVER? You can do this maneuver at home whenever you have symptoms of vertigo. You may do the Epley maneuver up to 3 times a day until your symptoms of vertigo go away. HOW SHOULD I DO THE EPLEY MANEUVER? 1. Sit on the edge of a bed or table with your back straight. Your legs should be extended or hanging over the edge of the bed or table.  2. Turn your head halfway toward the affected ear.  3. Lie backward quickly with your head turned until you are lying flat on your back. You may want to position a pillow under your shoulders.  4. Hold this position for 30 seconds. You may experience an attack of vertigo. This is normal. Hold this position until the vertigo stops. 5. Then turn your head to the opposite direction until your unaffected ear is facing the floor.  6. Hold this position for 30 seconds. You may experience an attack of vertigo. This is normal. Hold this position until the vertigo stops. 7. Now turn your whole body to  the same side as your head. Hold for another 30 seconds.  8. You can then sit back up. ARE THERE RISKS TO THIS MANEUVER? In some cases, you may have other symptoms (such as changes in your vision, weakness, or numbness). If you have these symptoms, stop doing the maneuver and call your health care provider. Even if doing these maneuvers relieves your vertigo, you may still have dizziness. Dizziness is the sensation of light-headedness but without the sensation of movement. Even though the Epley maneuver may relieve your vertigo, it is possible that your symptoms will return within 5 years. WHAT SHOULD I DO AFTER THIS MANEUVER? After doing the Epley maneuver, you can return to your normal activities. Ask your doctor if there is anything you should do at home to prevent vertigo. This may include:  Sleeping with two or more pillows to keep your head elevated.  Not sleeping on the side of your affected ear.  Getting up slowly from bed.  Avoiding sudden movements during the day.  Avoiding extreme head movement, like looking up or bending over.  Wearing a cervical collar to prevent sudden head movements. WHAT SHOULD I DO IF MY SYMPTOMS GET WORSE? Call your health care provider if your vertigo gets worse. Call your provider right way if you have other symptoms, including:   Nausea.  Vomiting.  Headache.  Weakness.  Numbness.  Vision changes. Document Released: 09/06/2013 Document Reviewed: 09/06/2013 [employer]   Patient Information 2015 [employer]. This information is not intended to replace advice given to you by your health care provider. Make sure you discuss any questions you have with your health care provider.  

## 2014-07-17 ENCOUNTER — Encounter: Payer: Self-pay | Admitting: Family Medicine

## 2014-07-17 ENCOUNTER — Ambulatory Visit (INDEPENDENT_AMBULATORY_CARE_PROVIDER_SITE_OTHER): Payer: Medicaid Other | Admitting: Family Medicine

## 2014-07-17 VITALS — BP 138/82 | HR 61 | Temp 97.8°F | Ht 66.0 in | Wt 204.0 lb

## 2014-07-17 DIAGNOSIS — I10 Essential (primary) hypertension: Secondary | ICD-10-CM

## 2014-07-17 DIAGNOSIS — H8110 Benign paroxysmal vertigo, unspecified ear: Secondary | ICD-10-CM

## 2014-07-17 DIAGNOSIS — F419 Anxiety disorder, unspecified: Secondary | ICD-10-CM

## 2014-07-17 LAB — BASIC METABOLIC PANEL
BUN: 7 mg/dL (ref 6–23)
CO2: 26 mEq/L (ref 19–32)
Calcium: 9.2 mg/dL (ref 8.4–10.5)
Chloride: 104 mEq/L (ref 96–112)
Creat: 0.74 mg/dL (ref 0.50–1.10)
Glucose, Bld: 92 mg/dL (ref 70–99)
Potassium: 4.6 mEq/L (ref 3.5–5.3)
Sodium: 138 mEq/L (ref 135–145)

## 2014-07-17 MED ORDER — MECLIZINE HCL 25 MG PO TABS: 25.0000 mg | ORAL_TABLET | Freq: Three times a day (TID) | ORAL | Status: DC | PRN

## 2014-07-17 MED ORDER — ALPRAZOLAM 1 MG PO TABS: 1.0000 mg | ORAL_TABLET | Freq: Every evening | ORAL | Status: DC | PRN

## 2014-07-17 MED ORDER — HYDROCHLOROTHIAZIDE 25 MG PO TABS: ORAL_TABLET | ORAL | Status: DC

## 2014-07-17 MED ORDER — ATENOLOL 50 MG PO TABS: ORAL_TABLET | ORAL | Status: DC

## 2014-07-17 NOTE — Patient Instructions (Addendum)
Nice to meet you. Please continue to do the epley maneuver exercises.  We will refer you to vestibular rehab. They will call you to set up an appointment.  If you have change in vision, weakness, or numbness please go to the ED.

## 2014-07-18 NOTE — Assessment & Plan Note (Signed)
>>  ASSESSMENT AND PLAN FOR HYPERTENSION, BENIGN WRITTEN ON 07/18/2014  8:38 PM BY SONNENBERG, ERIC G, MD  At goal on recheck. Will continue current medications. Refills given. F/u in 3 months for this issue.

## 2014-07-18 NOTE — Assessment & Plan Note (Signed)
Stable on current xanax dose. Refills given.

## 2014-07-18 NOTE — Progress Notes (Signed)
Patient ID: Mariah [name], female   DOB: 03-25-1961, 53 y.o.   MRN: [id number]  Mariah AlarEric Lynden Flemmer, MD Phone: [phone]  Mariah [name] is a 53 y.o. female who presents today for f/u.  HYPERTENSION Disease Monitoring Home BP Monitoring not checking daily, last check at [facility] was 120/70 Chest pain- no    Dyspnea- no Medications Compliance-  Taking HCTZ and atenolol.    Lightheadedness-  no  Edema- no  Anxiety: taking xanax for this. Notes this works well for her. She only takes this when she feels panic sensation that comes on in specific situations. Sometimes does not have to take the medication for 1-2 weeks. Not interested in switching medication.   Vertigo: this is a recurrent issue since 2000. Her symptoms have improved since her visit on 06/14/14. She has been doing the epley maneuver. She does not one episode last week that occurred while driving, she noted it felt as though her left eye was pulling to the left, though had no change in vision with this. She notes it happens when she sits up. She has not tried medication for this or vestibular rehab. 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  Patient is a former smoker.   ROS: Per HPI   Physical Exam Filed Vitals:   07/17/14 1101  BP: 138/82  Pulse:   Temp:     Gen: Well NAD HEENT: PERRL,  MMM, dix-hallpike with mild reproduction of symptoms, though no nystagmus. Lungs: CTABL Nl WOB Heart: RRR no MRG Neuro: CN 2-12 intact, 5/5 strength in bilateral biceps, triceps, grip, quads, hamstrings, plantar and dorsiflexion, sensation to light touch intact in bilateral UE and LE, normal gait, 2+ patellar reflexes Exts: Non edematous BL  LE, warm and well perfused.    Assessment/Plan: Please see individual problem list.  # Healthcare maintenance: need to discuss colonoscopy at next visit  Mariah AlarEric Cathalina Barcia, MD [facility]

## 2014-07-18 NOTE — Assessment & Plan Note (Signed)
At goal on recheck. Will continue current medications. Refills given. F/u in 3 months for this issue.

## 2014-07-18 NOTE — Assessment & Plan Note (Addendum)
Symptoms improving with home epley maneuver. No neuro deficits on exam. Will give meclizine for prn use. Referral made to vestibular rehab. Given return precautions.

## 2014-07-20 NOTE — Progress Notes (Signed)
   Subjective:    Patient ID: Mariah [name], female    DOB: 1960/12/16, 53 y.o.   MRN: [id number]  HPI  vertigo: has been seen multiple times for complaint, has rx for meclizine.  Does not do exercises.  Denies difficulty ambulating, no fecal/urinary incontinence.  No visual changes.  No head trauma or headaches.   Review of Systems  Constitutional: Negative for chills and diaphoresis.  Respiratory: Negative for cough and shortness of breath.   Cardiovascular: Negative for leg swelling.  Gastrointestinal: Negative for abdominal pain, diarrhea, constipation and anal bleeding.  Endocrine: Negative for cold intolerance and heat intolerance.  Genitourinary: Negative for dysuria, urgency, decreased urine volume and difficulty urinating.  Musculoskeletal: Positive for arthralgias (knee pain).  Neurological: Positive for dizziness. Negative for headaches.  Psychiatric/Behavioral: Negative for suicidal ideas, self-injury and agitation. The patient is nervous/anxious.        Objective:   Physical Exam  Constitutional: She appears well-developed and well-nourished. No distress.  HENT:  Head: Normocephalic and atraumatic.  Eyes: Conjunctivae are normal. Pupils are equal, round, and reactive to light.  Neck: Normal range of motion. Neck supple. No thyromegaly present.  Cardiovascular: Normal rate.   Pulmonary/Chest: Effort normal. No respiratory distress.  Abdominal: Soft. She exhibits no distension. There is no tenderness.  Musculoskeletal: She exhibits no edema.  Skin: Skin is warm and dry. No rash noted. She is not diaphoretic. No erythema.          Assessment & Plan:  Vertigo: I personally went over Epley maneuvers with patient, we did them multiple times and patient agreed to do them at home.  No red flag signs or need for imaging at this time, discussed with patient and she now feels more reassured  Anxiety: no rx for benzos today, patient adamantly declines any SSRIs or other  treatment modalities, "who do I need to see that will prescribe it to me?"  Although patient remained calm she did become agitated and repeatedly declined any other medication options or treatment plans.   I declined refill.  Knee pain: follow up at next visit with PCP  Perry MountACOSTA,Marino Rogerson ROCIO, MD 9:21 AM

## 2014-08-24 ENCOUNTER — Ambulatory Visit: Payer: Self-pay | Admitting: Family Medicine

## 2014-08-25 ENCOUNTER — Encounter: Payer: Self-pay | Admitting: Family Medicine

## 2014-08-25 ENCOUNTER — Ambulatory Visit (INDEPENDENT_AMBULATORY_CARE_PROVIDER_SITE_OTHER): Payer: Medicaid Other | Admitting: Family Medicine

## 2014-08-25 VITALS — BP 142/70 | HR 64 | Temp 98.1°F | Ht 66.0 in | Wt 205.5 lb

## 2014-08-25 DIAGNOSIS — J069 Acute upper respiratory infection, unspecified: Secondary | ICD-10-CM | POA: Insufficient documentation

## 2014-08-25 MED ORDER — ALBUTEROL SULFATE HFA 108 (90 BASE) MCG/ACT IN AERS: 2.0000 | INHALATION_SPRAY | Freq: Four times a day (QID) | RESPIRATORY_TRACT | Status: DC | PRN

## 2014-08-25 NOTE — Patient Instructions (Signed)
It was a pleasure to see you today. I believe you are getting over a viral infection that is causing your symptoms.   INCREASE MUCINEX TO 1200mg  BY MOUTH EVERY 12 HOURS.  Plenty of water.  RESUME USE OF FLONASE NASAL SPRAY, 2 sprays/nostril one time each morning. Remember to gargle afterward.   Albuterol for cough (2 sprays every 6 hours) at night if the cough is not better with the increased mucinex and addition of the flonase.   HOT STEAM from shower or humidifier.

## 2014-08-25 NOTE — Progress Notes (Signed)
   Subjective:    Patient ID: Mariah [name], female    DOB: 09/15/1961, 53 y.o.   MRN: [id number]  HPI Patient for SDA for nighttime cough and nasal congestion that has been ongoing since 11/25. Started as a cough and chest congestion, migrated to face/head. Yellowish non-bloody phlegm from chest and nose. No fevers or chills. No nausea/vomiting/diarrhea. No shortness of breath. No sick contacts.   Has taken nasal saline spray with some relief. Mucinex 600mg  twice daily. Halls cough drops without adequate relief.   Social Hx; She quit smoking in 1997, has never returned to smoking.    Review of Systems     Objective:   Physical Exam Generally well appearing, no apparent distress HEENT Neck supple, no cervical nodes. TMs clear bilaterally. No frontal or maxillary sinus tenderness.  Nasal mucosa with watery thin rhinorrhea, no purulence and no blood. Mucus membranes in nose pale.  COR Regular S1S2 PULM Clear bilaterally, no rales or wheezes       Assessment & Plan:

## 2014-10-17 ENCOUNTER — Encounter (HOSPITAL_COMMUNITY): Payer: Self-pay | Admitting: Emergency Medicine

## 2014-10-17 ENCOUNTER — Emergency Department (HOSPITAL_COMMUNITY)
Admission: EM | Admit: 2014-10-17 | Discharge: 2014-10-17 | Disposition: A | Discharge status: Home / Self Care | Payer: Medicaid Other | Attending: Emergency Medicine | Admitting: Emergency Medicine

## 2014-10-17 ENCOUNTER — Emergency Department (HOSPITAL_COMMUNITY): Payer: Medicaid Other

## 2014-10-17 DIAGNOSIS — K219 Gastro-esophageal reflux disease without esophagitis: Secondary | ICD-10-CM | POA: Insufficient documentation

## 2014-10-17 DIAGNOSIS — Z7951 Long term (current) use of inhaled steroids: Secondary | ICD-10-CM | POA: Insufficient documentation

## 2014-10-17 DIAGNOSIS — Z87891 Personal history of nicotine dependence: Secondary | ICD-10-CM | POA: Insufficient documentation

## 2014-10-17 DIAGNOSIS — M542 Cervicalgia: Secondary | ICD-10-CM | POA: Diagnosis not present

## 2014-10-17 DIAGNOSIS — I10 Essential (primary) hypertension: Secondary | ICD-10-CM | POA: Diagnosis not present

## 2014-10-17 DIAGNOSIS — G8911 Acute pain due to trauma: Secondary | ICD-10-CM | POA: Diagnosis not present

## 2014-10-17 DIAGNOSIS — Z7982 Long term (current) use of aspirin: Secondary | ICD-10-CM | POA: Diagnosis not present

## 2014-10-17 DIAGNOSIS — Z791 Long term (current) use of non-steroidal anti-inflammatories (NSAID): Secondary | ICD-10-CM | POA: Diagnosis not present

## 2014-10-17 DIAGNOSIS — R079 Chest pain, unspecified: Secondary | ICD-10-CM | POA: Diagnosis not present

## 2014-10-17 DIAGNOSIS — M5134 Other intervertebral disc degeneration, thoracic region: Secondary | ICD-10-CM | POA: Insufficient documentation

## 2014-10-17 DIAGNOSIS — Z79899 Other long term (current) drug therapy: Secondary | ICD-10-CM | POA: Insufficient documentation

## 2014-10-17 DIAGNOSIS — M79602 Pain in left arm: Secondary | ICD-10-CM | POA: Diagnosis not present

## 2014-10-17 DIAGNOSIS — Z981 Arthrodesis status: Secondary | ICD-10-CM | POA: Diagnosis not present

## 2014-10-17 DIAGNOSIS — Z8739 Personal history of other diseases of the musculoskeletal system and connective tissue: Secondary | ICD-10-CM

## 2014-10-17 LAB — BASIC METABOLIC PANEL
Anion gap: 4 — ABNORMAL LOW (ref 5–15)
BUN: 12 mg/dL (ref 6–23)
CO2: 29 mmol/L (ref 19–32)
Calcium: 8.8 mg/dL (ref 8.4–10.5)
Chloride: 100 mmol/L (ref 96–112)
Creatinine, Ser: 0.7 mg/dL (ref 0.50–1.10)
GFR calc Af Amer: >90 mL/min (ref >90)
GFR calc non Af Amer: >90 mL/min (ref >90)
Glucose, Bld: 100 mg/dL — ABNORMAL HIGH (ref 70–99)
Potassium: 7.3 mmol/L (ref 3.5–5.1)
Sodium: 133 mmol/L — ABNORMAL LOW (ref 135–145)

## 2014-10-17 LAB — I-STAT TROPONIN, ED: Troponin i, poc: 0.01 ng/mL (ref 0.00–0.08)

## 2014-10-17 LAB — CBC
HCT: 41.3 % (ref 36.0–46.0)
Hemoglobin: 13.5 g/dL (ref 12.0–15.0)
MCH: 28 pg (ref 26.0–34.0)
MCHC: 32.7 g/dL (ref 30.0–36.0)
MCV: 85.7 fL (ref 78.0–100.0)
Platelets: 248 10*3/uL (ref 150–400)
RBC: 4.82 MIL/uL (ref 3.87–5.11)
RDW: 14.6 % (ref 11.5–15.5)
WBC: 6.9 10*3/uL (ref 4.0–10.5)

## 2014-10-17 MED ORDER — TRAMADOL HCL 50 MG PO TABS: 50.0000 mg | ORAL_TABLET | Freq: Four times a day (QID) | ORAL | Status: DC | PRN

## 2014-10-17 MED ORDER — ASPIRIN 325 MG PO TABS
325.0000 mg | ORAL_TABLET | Freq: Once | ORAL | Status: AC
  Administered 2014-10-17: 325 mg via ORAL
  Filled 2014-10-17: qty 1

## 2014-10-17 NOTE — ED Notes (Signed)
Patient transported to X-ray without distress.  

## 2014-10-17 NOTE — ED Notes (Signed)
To ED via private vehicle with c/o chest and back pressure, started last night, had been feeling tired and "not right" since Thursday.
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
## 2014-10-31 ENCOUNTER — Emergency Department (HOSPITAL_COMMUNITY)
Admission: EM | Admit: 2014-10-31 | Discharge: 2014-10-31 | Disposition: A | Discharge status: Home / Self Care | Payer: Medicaid Other | Attending: Emergency Medicine | Admitting: Emergency Medicine

## 2014-10-31 ENCOUNTER — Encounter (HOSPITAL_COMMUNITY): Payer: Self-pay

## 2014-10-31 DIAGNOSIS — Z87891 Personal history of nicotine dependence: Secondary | ICD-10-CM | POA: Diagnosis not present

## 2014-10-31 DIAGNOSIS — Z791 Long term (current) use of non-steroidal anti-inflammatories (NSAID): Secondary | ICD-10-CM | POA: Insufficient documentation

## 2014-10-31 DIAGNOSIS — K219 Gastro-esophageal reflux disease without esophagitis: Secondary | ICD-10-CM | POA: Insufficient documentation

## 2014-10-31 DIAGNOSIS — G8929 Other chronic pain: Secondary | ICD-10-CM | POA: Insufficient documentation

## 2014-10-31 DIAGNOSIS — Z79899 Other long term (current) drug therapy: Secondary | ICD-10-CM | POA: Diagnosis not present

## 2014-10-31 DIAGNOSIS — M5410 Radiculopathy, site unspecified: Secondary | ICD-10-CM | POA: Diagnosis not present

## 2014-10-31 DIAGNOSIS — Z7951 Long term (current) use of inhaled steroids: Secondary | ICD-10-CM | POA: Insufficient documentation

## 2014-10-31 DIAGNOSIS — M79604 Pain in right leg: Secondary | ICD-10-CM | POA: Diagnosis present

## 2014-10-31 DIAGNOSIS — Z7982 Long term (current) use of aspirin: Secondary | ICD-10-CM | POA: Diagnosis not present

## 2014-10-31 DIAGNOSIS — I10 Essential (primary) hypertension: Secondary | ICD-10-CM | POA: Insufficient documentation

## 2014-10-31 MED ORDER — GABAPENTIN 300 MG PO CAPS
300.0000 mg | ORAL_CAPSULE | Freq: Once | ORAL | Status: AC
  Administered 2014-10-31: 300 mg via ORAL
  Filled 2014-10-31: qty 1

## 2014-10-31 MED ORDER — HYDROMORPHONE HCL 1 MG/ML IJ SOLN
2.0000 mg | Freq: Once | INTRAMUSCULAR | Status: AC
  Administered 2014-10-31: 2 mg via INTRAMUSCULAR
  Filled 2014-10-31: qty 2

## 2014-10-31 MED ORDER — METHOCARBAMOL 500 MG PO TABS
1000.0000 mg | ORAL_TABLET | Freq: Once | ORAL | Status: AC
  Administered 2014-10-31: 1000 mg via ORAL
  Filled 2014-10-31: qty 2

## 2014-10-31 MED ORDER — NAPROXEN 250 MG PO TABS
500.0000 mg | ORAL_TABLET | Freq: Once | ORAL | Status: AC
  Administered 2014-10-31: 500 mg via ORAL
  Filled 2014-10-31: qty 2

## 2014-10-31 MED ORDER — ONDANSETRON 4 MG PO TBDP
8.0000 mg | ORAL_TABLET | Freq: Once | ORAL | Status: AC
  Administered 2014-10-31: 8 mg via ORAL
  Filled 2014-10-31: qty 2

## 2014-10-31 MED ORDER — METHYLPREDNISOLONE 4 MG PO KIT: PACK | ORAL | Status: DC

## 2014-10-31 MED ORDER — OXYCODONE-ACETAMINOPHEN 5-325 MG PO TABS
2.0000 | ORAL_TABLET | Freq: Once | ORAL | Status: AC
  Administered 2014-10-31: 2 via ORAL
  Filled 2014-10-31: qty 2

## 2014-10-31 MED ORDER — GABAPENTIN 300 MG PO CAPS: 300.0000 mg | ORAL_CAPSULE | Freq: Three times a day (TID) | ORAL | Status: DC

## 2014-10-31 MED ORDER — METHOCARBAMOL 750 MG PO TABS: 750.0000 mg | ORAL_TABLET | Freq: Four times a day (QID) | ORAL | Status: DC

## 2014-10-31 NOTE — ED Notes (Signed)
Pt states pain in R hip has been increasing since the 6th of Feb, pt denies injury to hip, states she has sciatica in left leg, states pain is so bad she can not walk

## 2014-10-31 NOTE — ED Provider Notes (Signed)
CSN: [id number]     Arrival date & time 10/31/14  0447 History   First MD Initiated Contact with Patient 10/31/14 0518     Chief Complaint  Patient presents with  . Leg Pain     (Consider location/radiation/quality/duration/timing/severity/associated sxs/prior Treatment) HPI 54 year old female presents to the emergency department with complaint of right leg pain.  Symptoms have been ongoing since February 6, 10 days ago.  She denies any trauma to the area.  Pain is a deep seated ache and will occasionally throbbing get worse.  Patient takes Percocet for chronic neck pain.  She has not taken the Percocet for her leg pain and she does not want to mask any symptoms.  Patient with right knee swelling about a week prior to onset of right hip and thigh pain.  Patient was seen by her orthopedist who suggested an injection.  She denies any weakness numbness.  She reports that occasionally she has tingling in her toes.  Patient took Aleve without improvement in symptoms.  She reports in the last 2 days the symptoms have gotten worse in any sort of movement or ambulation or prolonged standing worsens the pain.  Patient has history of degenerative disc disease, has had prior cervical and lumbar surgeries.  She is followed by a local orthopedist group.  She has not yet followed up with them for this ongoing issue. Past Medical History  Diagnosis Date  . GERD (gastroesophageal reflux disease)   . Degenerative disc disease, lumbar   . Hypertension   . Mitral valve prolapse    Past Surgical History  Procedure Laterality Date  . Cholecystectomy    . Back surgery    . Abdominal hysterectomy    . Abdominal hysterectomy    . Knee surgery     No family history on file. History  Substance Use Topics  . Smoking status: Former Smoker    Quit date: 09/16/1995  . Smokeless tobacco: Never Used  . Alcohol Use: Yes   OB History    Gravida Para Term Preterm AB TAB SAB Ectopic Multiple Living   2 2             Review of Systems   See History of Present Illness; otherwise all other systems are reviewed and negative  Allergies  Lisinopril; Moxifloxacin; and Sulfonamide derivatives  Home Medications   Prior to Admission medications   Medication Sig Start Date End Date Taking? Authorizing Provider  albuterol (PROVENTIL HFA;VENTOLIN HFA) 108 (90 BASE) MCG/ACT inhaler Inhale 2 puffs into the lungs every 6 (six) hours as needed for wheezing or shortness of breath. 08/25/14  Yes Barbaraann Barthel, MD  ALPRAZolam Prudy Feeler) 1 MG tablet Take 1 tablet (1 mg total) by mouth at bedtime as needed for sleep or anxiety. 07/17/14  Yes Glori Luis, MD  aspirin EC 81 MG tablet Take 1 tablet (81 mg total) by mouth daily. 08/16/13  Yes Amber Nydia Bouton, MD  atenolol (TENORMIN) 50 MG tablet TAKE ONE TABLET BY MOUTH ONCE DAILY 07/17/14  Yes Glori Luis, MD  cetirizine (ZYRTEC) 10 MG tablet Take 10 mg by mouth daily.   Yes Historical Provider, MD  fluticasone (FLONASE) 50 MCG/ACT nasal spray Place 2 sprays into both nostrils daily. 10/05/13  Yes Amber Nydia Bouton, MD  hydrochlorothiazide (HYDRODIURIL) 25 MG tablet TAKE ONE TABLET BY MOUTH ONCE DAILY 07/17/14  Yes Glori Luis, MD  Multiple Vitamin (MULTIVITAMIN WITH MINERALS) TABS Take 1 tablet by mouth daily. Women's Multivitamin  Yes Historical Provider, MD  oxyCODONE-acetaminophen (PERCOCET) 10-325 MG per tablet Take 1 tablet by mouth every 4 (four) hours as needed for pain.   Yes Historical Provider, MD  cyclobenzaprine (FLEXERIL) 5 MG tablet Take 1 tablet (5 mg total) by mouth 3 (three) times daily as needed for muscle spasms. Patient not taking: Reported on 10/31/2014 02/13/14   Hilarie FredricksonAmber M Hairford, MD  esomeprazole (NEXIUM) 40 MG capsule Take 1 capsule (40 mg total) by mouth daily. Patient not taking: Reported on 10/31/2014 02/13/14   Hilarie FredricksonAmber M Hairford, MD  imiquimod Mathis Dad(ALDARA) 5 % cream Apply topically 3 (three) times a week. Patient not taking: Reported on  10/31/2014 08/16/13   Hilarie FredricksonAmber M Hairford, MD  meclizine (ANTIVERT) 25 MG tablet Take 1 tablet (25 mg total) by mouth 3 (three) times daily as needed for dizziness. Patient not taking: Reported on 10/17/2014 07/17/14   Glori LuisEric G Sonnenberg, MD  meloxicam (MOBIC) 7.5 MG tablet Take 1 tablet (7.5 mg total) by mouth daily. Patient not taking: Reported on 10/17/2014 05/30/13   Renne CriglerJoshua Geiple, PA-C  traMADol (ULTRAM) 50 MG tablet Take 1 tablet (50 mg total) by mouth every 6 (six) hours as needed. Patient not taking: Reported on 10/31/2014 10/17/14   Junius FinnerErin O'Malley, PA-C   BP 111/57 mmHg  Pulse 87  Temp(Src) 97.8 F (36.6 C)  Resp 16  Ht 5\' 6"  (1.676 m)  Wt 198 lb (89.812 kg)  BMI 31.97 kg/m2  SpO2 100% Physical Exam  Constitutional: She is oriented to person, place, and time. She appears well-developed and well-nourished.  HENT:  Head: Normocephalic and atraumatic.  Nose: Nose normal.  Mouth/Throat: Oropharynx is clear and moist.  Eyes: Conjunctivae and EOM are normal. Pupils are equal, round, and reactive to light.  Neck: Normal range of motion. Neck supple. No JVD present. No tracheal deviation present. No thyromegaly present.  Cardiovascular: Normal rate, regular rhythm, normal heart sounds and intact distal pulses.  Exam reveals no gallop and no friction rub.   No murmur heard. Pulmonary/Chest: Effort normal and breath sounds normal. No stridor. No respiratory distress. She has no wheezes. She has no rales. She exhibits no tenderness.  Abdominal: Soft. Bowel sounds are normal. She exhibits no distension and no mass. There is no tenderness. There is no rebound and no guarding.  Musculoskeletal: Normal range of motion. She exhibits tenderness (tender to palpation along right gluteus without crepitus, step-off, or overlying skin changes.SI and right). She exhibits no edema.  Lymphadenopathy:    She has no cervical adenopathy.  Neurological: She is alert and oriented to person, place, and time. She displays  normal reflexes. No cranial nerve deficit. She exhibits normal muscle tone. Coordination normal.  Negative straight leg raise  Skin: Skin is warm and dry. No rash noted. No erythema. No pallor.  Psychiatric: She has a normal mood and affect. Her behavior is normal. Judgment and thought content normal.  Nursing note and vitals reviewed.   ED Course  Procedures (including critical care time) Labs Review Labs Reviewed - No data to display  Imaging Review No results found.   EKG Interpretation None      MDM   Final diagnoses:  Radiculopathy of leg    54 yo female with right hip/buttock/thigh pain, appears radicular in nature.  Plan for treatment of pain with robaxin, medrol dose pack, nsaids, and close f/u with ortho/pcm.    Olivia Mackielga M Shaundrea Carrigg, MD 10/31/14 [id number]

## 2014-10-31 NOTE — Discharge Instructions (Signed)
Take medications as prescribed.  Please follow up with your orthopedist for further workup and treatment of your ongoing pain.   Radicular Pain Radicular pain in either the arm or leg is usually from a bulging or herniated disk in the spine. A piece of the herniated disk may press against the nerves as the nerves exit the spine. This causes pain which is felt at the tips of the nerves down the arm or leg. Other causes of radicular pain may include:  Fractures.  Heart disease.  Cancer.  An abnormal and usually degenerative state of the nervous system or nerves (neuropathy). Diagnosis may require CT or MRI scanning to determine the primary cause.  Nerves that start at the neck (nerve roots) may cause radicular pain in the outer shoulder and arm. It can spread down to the thumb and fingers. The symptoms vary depending on which nerve root has been affected. In most cases radicular pain improves with conservative treatment. Neck problems may require physical therapy, a neck collar, or cervical traction. Treatment may take many weeks, and surgery may be considered if the symptoms do not improve.  Conservative treatment is also recommended for sciatica. Sciatica causes pain to radiate from the lower back or buttock area down the leg into the foot. Often there is a history of back problems. Most patients with sciatica are better after 2 to 4 weeks of rest and other supportive care. Short term bed rest can reduce the disk pressure considerably. Sitting, however, is not a good position since this increases the pressure on the disk. You should avoid bending, lifting, and all other activities which make the problem worse. Traction can be used in severe cases. Surgery is usually reserved for patients who do not improve within the first months of treatment. Only take over-the-counter or prescription medicines for pain, discomfort, or fever as directed by your caregiver. Narcotics and muscle relaxants may help by  relieving more severe pain and spasm and by providing mild sedation. Cold or massage can give significant relief. Spinal manipulation is not recommended. It can increase the degree of disc protrusion. Epidural steroid injections are often effective treatment for radicular pain. These injections deliver medicine to the spinal nerve in the space between the protective covering of the spinal cord and back bones (vertebrae). Your caregiver can give you more information about steroid injections. These injections are most effective when given within two weeks of the onset of pain.  You should see your caregiver for follow up care as recommended. A program for neck and back injury rehabilitation with stretching and strengthening exercises is an important part of management.  SEEK IMMEDIATE MEDICAL CARE IF:  You develop increased pain, weakness, or numbness in your arm or leg.  You develop difficulty with bladder or bowel control.  You develop abdominal pain. Document Released: 10/09/2004 Document Revised: 11/24/2011 Document Reviewed: 12/25/2008 [facility] Patient Information 2015 [employer], [place]. This information is not intended to replace advice given to you by your health care provider. Make sure you discuss any questions you have with your health care provider.

## 2014-11-01 ENCOUNTER — Ambulatory Visit (INDEPENDENT_AMBULATORY_CARE_PROVIDER_SITE_OTHER): Payer: Medicaid Other | Admitting: Family Medicine

## 2014-11-01 ENCOUNTER — Telehealth: Payer: Self-pay | Admitting: Family Medicine

## 2014-11-01 ENCOUNTER — Encounter: Payer: Self-pay | Admitting: Family Medicine

## 2014-11-01 VITALS — BP 134/67 | HR 60 | Temp 98.1°F | Ht 66.0 in | Wt 206.0 lb

## 2014-11-01 DIAGNOSIS — E875 Hyperkalemia: Secondary | ICD-10-CM

## 2014-11-01 DIAGNOSIS — M79604 Pain in right leg: Secondary | ICD-10-CM

## 2014-11-01 DIAGNOSIS — M5441 Lumbago with sciatica, right side: Secondary | ICD-10-CM

## 2014-11-01 LAB — COMPREHENSIVE METABOLIC PANEL
ALT: 16 U/L (ref 0–35)
AST: 17 U/L (ref 0–37)
Albumin: 4.3 g/dL (ref 3.5–5.2)
Alkaline Phosphatase: 59 U/L (ref 39–117)
BUN: 14 mg/dL (ref 6–23)
CO2: 29 mEq/L (ref 19–32)
Calcium: 9.4 mg/dL (ref 8.4–10.5)
Chloride: 100 mEq/L (ref 96–112)
Creat: 0.73 mg/dL (ref 0.50–1.10)
Glucose, Bld: 95 mg/dL (ref 70–99)
Potassium: 3.8 mEq/L (ref 3.5–5.3)
Sodium: 135 mEq/L (ref 135–145)
Total Bilirubin: 0.4 mg/dL (ref 0.2–1.2)
Total Protein: 6.6 g/dL (ref 6.0–8.3)

## 2014-11-01 MED ORDER — PREDNISONE 50 MG PO TABS: ORAL_TABLET | ORAL | Status: DC

## 2014-11-01 NOTE — Assessment & Plan Note (Addendum)
Sounds like right SI joint dysfunction with positive FABER and tenderness over SI joint. Also some tenderness over greater trochanter. Some sciatica. No red flag symptoms. - Hold ibuprofen/mobic/naproxen/advil while taking medrol dosepak. - Tylenol PRN, ice and heat - Discussed knee to chest ,knee to opposite shoulder, standing leg rotation exercises daily. - Venous duplex ordered. Unlikely DVT by exam and hx but patient with some calf reported pain and brother with h/o VTE. - F/u 3-4 weeks; if no improvement, consider SM referral / SI or greater trochanter joint injection. - Return precautions reviewed; Consider MRI if continues worsening or acute change

## 2014-11-01 NOTE — Telephone Encounter (Signed)
Please call patient and let her know that I realized after our visit that the ED prescribed her steroids already (medrol dosepak). She does not need to take the prednisone I prescribed but instead can just take the medrol dosepak. It will do the same thing.  Mariah SingletonMaria T Heddy Vidana, MD

## 2014-11-01 NOTE — Patient Instructions (Addendum)
This  sounds like some sciatica but also right SI joint dysfunction. There may be an element of trochanteric bursitis. Do not put weight on that right side or massage directly on it. Take prednisone for 5 days. Do not take ibuprofen/meloxicam/naproxen/advil while taking this. Take tylenol as needed. Do exercises we discussed (knee to chest, knee to opposite shoulder, standing leg rotation) daily. Follow up in 3-4 weeks. Use heat or ice - whatever feels better. If you have any of the red flags we discussed, seek immediate care.   Best,  Mariah SingletonMaria T Rayaan Lorah, MD
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
## 2014-11-02 ENCOUNTER — Ambulatory Visit (HOSPITAL_COMMUNITY)
Admission: RE | Admit: 2014-11-02 | Discharge: 2014-11-02 | Disposition: A | Discharge status: Home / Self Care | Payer: Medicaid Other | Source: Ambulatory Visit | Attending: Family Medicine | Admitting: Family Medicine

## 2014-11-02 DIAGNOSIS — M79609 Pain in unspecified limb: Secondary | ICD-10-CM | POA: Diagnosis not present

## 2014-11-02 DIAGNOSIS — M79604 Pain in right leg: Secondary | ICD-10-CM

## 2014-11-02 NOTE — Telephone Encounter (Signed)
LM for patient ok per ROI with details of message from MD.  She mentioned to me yesterday while waiting for her labs that she wasn't interested in taking steroids anyway. Syrah Daughtrey,CMA

## 2014-11-02 NOTE — Progress Notes (Signed)
ADDENDUM: Vascular lab paged stating patient's LE duplex negative for clot and they will inform patient.  Mariah SingletonMaria T Loetta Connelley, MD

## 2014-11-02 NOTE — Progress Notes (Signed)
*  PRELIMINARY RESULTS* Vascular Ultrasound Right lower extremity venous duplex has been completed.  Preliminary findings: no evidence of DVT  Farrel DemarkJill Eunice, RDMS, RVT  11/02/2014, 12:21 PM

## 2014-11-03 ENCOUNTER — Encounter: Payer: Self-pay | Admitting: Family Medicine

## 2014-11-05 ENCOUNTER — Encounter: Payer: Self-pay | Admitting: Family Medicine

## 2014-11-12 ENCOUNTER — Other Ambulatory Visit: Payer: Self-pay | Admitting: Family Medicine

## 2014-11-21 ENCOUNTER — Other Ambulatory Visit: Payer: Self-pay | Admitting: Family Medicine

## 2014-11-21 NOTE — Telephone Encounter (Signed)
Refills given. Patient needs follow-up for her blood pressure in the next 1-2 months. Please inform the patient. Thanks.

## 2014-11-22 NOTE — Telephone Encounter (Signed)
Pt has an appt on 11-23-14. Mariah [name],CMA

## 2014-11-23 ENCOUNTER — Ambulatory Visit (INDEPENDENT_AMBULATORY_CARE_PROVIDER_SITE_OTHER): Payer: Medicaid Other | Admitting: Family Medicine

## 2014-11-23 DIAGNOSIS — I10 Essential (primary) hypertension: Secondary | ICD-10-CM

## 2014-12-02 NOTE — Progress Notes (Signed)
Opened in error

## 2014-12-07 ENCOUNTER — Telehealth: Payer: Self-pay | Admitting: Family Medicine

## 2014-12-07 NOTE — Telephone Encounter (Signed)
Will forward to MD to place a new referral for this and put in notes that we are waiting on notes from a previous ortho provider. Mariah [name],CMA

## 2014-12-07 NOTE — Telephone Encounter (Signed)
Pt called and said that her ortho doctor is faxing her records to us so that we can give another referral to [facility] for the patient since he can not do the referral. jw

## 2014-12-11 ENCOUNTER — Telehealth: Payer: Self-pay | Admitting: Family Medicine

## 2014-12-11 DIAGNOSIS — M5020 Other cervical disc displacement, unspecified cervical region: Secondary | ICD-10-CM

## 2014-12-11 NOTE — Telephone Encounter (Signed)
Will forward to PCP for review. Laneah Luft, CMA. 

## 2014-12-11 NOTE — Telephone Encounter (Signed)
Called patient to follow-up on her previous call. She wanted to know if we had received the records from Dr Cassandria SanteeBotero's office. I advised her that I just looked in my box and at the fax machine and did not see records. She wanted to know what the problem was and why they were not here. She states she went to Dr [facility] Stores[facility]ero's office and spoke with someone who said they would fax the records right then. She states she saw them go to the back of the office with her records. She wants to know why people are lying to her and not doing what they say they are going to do. She states people are being unprofessional and not doing their jobs even though they are getting paid. She asked what the referral process is and why it is taking so long and why she has been bounced around from office to office to get this done. I advised the patient that given that she has medicaid she would need to be referred by her PCP, which is our office, and this is why she was sent back to us for a referral to [facility]. I advised that I placed an order for referral to [facility] neurosurgery and that this order goes to a person in our office that coordinates referrals. The referral would then need to be approved by her insurance company and then set up for an appointment. I apologized that she had been sent back to our office for the referral, though informed her that the referral order had been placed and would be worked on to get her scheduled. I advised that she should check back with Dr Cassandria SanteeBotero's office to see if they sent the records and that I would check with our office to see if we received any records. She voiced understanding. Will forward this message to Tia to try to expedite the referral process.

## 2014-12-11 NOTE — Telephone Encounter (Signed)
Will forward to PCP for advice. Karmela Bram, CMA. 

## 2014-12-11 NOTE — Telephone Encounter (Signed)
Pt has an appt on 12/15/14 with pcp.  Will wait for her to come in and sign a release of information for both of these providers. Lakyn Mantione,CMA

## 2014-12-11 NOTE — Telephone Encounter (Signed)
Called Dr Cassandria SanteeBotero's office regarding this patient. A nurse at his practice was able to touch base with Dr Jeral FruitBotero as he is in surgery at this time. She stated he felt that the patient needed surgery at this time and that the patient was requesting to [facility]. This is a different report than the patient gave to me when we spoke earlier. The nurse advised that Dr Jeral FruitBotero would call me when he was able to given his surgery schedule. Will await his call.

## 2014-12-11 NOTE — Telephone Encounter (Signed)
Called and spoke to patient regarding her previous phone request. 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 Patient states that Dr Farris HasKramer referred the patient to Dr Jeral FruitBotero, who the patient reports felt needed surgery though patient reports taht Dr Jeral FruitBotero wanted a second opinion from [facility] prior to having the patient undergo surgery and that Dr Jeral FruitBotero told her that the referral would need to come from her PCP. She states she was advised to get a brace to fit from her neck to her back to help stabilize her spine until she was able to be evaluated. She does note having numbness and tingling up and down her legs and arms. She denies weakness, loss of bowel and bladder continence, and saddle anesthesia. I will place the referral, though will also request records from Dr Cassandria SanteeBotero's office and Dr Farris HasKramer to confirm the findings and this request for referral.

## 2014-12-11 NOTE — Telephone Encounter (Signed)
Needs referral to [facility] for a "second opinion" for immediate "emergency" surgery to make sure that the surgery that the neuro specialist thinks she needs is the correct one / please call patient if more information is needed and to let her know when/if done / thanks HoneywellSadie Reynolds, ASA

## 2014-12-11 NOTE — Telephone Encounter (Signed)
Pt called again. Her drs office is faxing over the records needed. She wants dr Birdie Sonssonnenberg to call her today before he leaves today

## 2014-12-11 NOTE — Telephone Encounter (Signed)
Will forward to MD to place referral.  Tried to call patient and let her know this but number was not working. Addi Pak,CMA

## 2014-12-11 NOTE — Telephone Encounter (Signed)
Pt called asking about the status of her referral. When I asked what kind of surgery was she having that had to be done"ASAP", she got very angry and said I wasn't helping her.

## 2014-12-12 NOTE — Telephone Encounter (Signed)
Patient presented to the office this afternoon just prior to 5 pm demanding to speak with me. She states she got the records from Dr [employer] [facility]'s office today. She notes she called our office and spoke with Mariah [name] earlier to ensure that we had received the documents. She states Mariah [name] checked the fax machine, my box, and with the person who handles faxes and that the records had no made it to our office. The patient presented with the cover sheet of the records and stated that there was confirmation that they had been sent based off of the date and time stamp on the cover sheet saying 12/12/14 11:50:59 am, though there is apparently no confirmation page stating that the fax was received by our fax machine. She states this is confirmation and that some one in our office is not doing their job as the records should be here. I apologized that we did not have the records and explained that there was likely an issue with transmission between the fax machines as we had not received them as of yet. I advised her that now that we have the records we could work on the referral to [facility] for her second opinion and that the records would be faxed to them. She asked if she would be contacted by us or [facility] regarding the appointment and I advised her that I was unsure of that aspect and that I would ask our referral person this question. Will send this message to [facility] and advise her that the records are in my box so they can be fax[facility] to [facility]. I will ask her to let the patient know that the records have been faxed and to let the patient know if she is going to hear from us or [facility] regarding the appointment.

## 2014-12-12 NOTE — Telephone Encounter (Signed)
Pt called again-upset and d yelling. There was no fax from dr botero's office. She insists Dr Antony HasteSonnenburg call her today before he leaves

## 2014-12-12 NOTE — Telephone Encounter (Signed)
I have already called and got the fax number to [facility]. I just need the records, once you receive the records, we can move forward from there.

## 2014-12-13 ENCOUNTER — Encounter: Payer: Self-pay | Admitting: Family Medicine

## 2014-12-13 NOTE — Progress Notes (Signed)
Patient would like to know if you have already faxed the referral.  She wants to find out what dr you are referring her to and the date and time that you faxed the form.  Please call her at [phone].

## 2014-12-13 NOTE — Progress Notes (Signed)
Spoke with patient and let her know referral was faxed today by coordinator.  We will not know the name of physician she is seeing until they review her notes and set her up with an appt.  She voiced understanding of this and was appreciative.

## 2014-12-15 ENCOUNTER — Ambulatory Visit: Payer: Self-pay | Admitting: Family Medicine

## 2014-12-22 DIAGNOSIS — G959 Disease of spinal cord, unspecified: Secondary | ICD-10-CM | POA: Insufficient documentation

## 2015-01-04 ENCOUNTER — Emergency Department (HOSPITAL_COMMUNITY)
Admission: EM | Admit: 2015-01-04 | Discharge: 2015-01-04 | Disposition: A | Discharge status: Home / Self Care | Payer: Medicaid Other | Attending: Emergency Medicine | Admitting: Emergency Medicine

## 2015-01-04 ENCOUNTER — Encounter (HOSPITAL_COMMUNITY): Payer: Self-pay | Admitting: Emergency Medicine

## 2015-01-04 DIAGNOSIS — I10 Essential (primary) hypertension: Secondary | ICD-10-CM | POA: Diagnosis not present

## 2015-01-04 DIAGNOSIS — Z7982 Long term (current) use of aspirin: Secondary | ICD-10-CM | POA: Insufficient documentation

## 2015-01-04 DIAGNOSIS — Z7951 Long term (current) use of inhaled steroids: Secondary | ICD-10-CM | POA: Insufficient documentation

## 2015-01-04 DIAGNOSIS — R198 Other specified symptoms and signs involving the digestive system and abdomen: Secondary | ICD-10-CM

## 2015-01-04 DIAGNOSIS — R200 Anesthesia of skin: Secondary | ICD-10-CM | POA: Diagnosis present

## 2015-01-04 DIAGNOSIS — Z87891 Personal history of nicotine dependence: Secondary | ICD-10-CM | POA: Diagnosis not present

## 2015-01-04 DIAGNOSIS — Z79899 Other long term (current) drug therapy: Secondary | ICD-10-CM | POA: Insufficient documentation

## 2015-01-04 DIAGNOSIS — Z8669 Personal history of other diseases of the nervous system and sense organs: Secondary | ICD-10-CM | POA: Diagnosis not present

## 2015-01-04 DIAGNOSIS — R195 Other fecal abnormalities: Secondary | ICD-10-CM | POA: Diagnosis not present

## 2015-01-04 DIAGNOSIS — I341 Nonrheumatic mitral (valve) prolapse: Secondary | ICD-10-CM | POA: Insufficient documentation

## 2015-01-04 DIAGNOSIS — K219 Gastro-esophageal reflux disease without esophagitis: Secondary | ICD-10-CM | POA: Insufficient documentation

## 2015-01-04 DIAGNOSIS — Z9289 Personal history of other medical treatment: Secondary | ICD-10-CM

## 2015-01-04 NOTE — ED Notes (Addendum)
States many issues with back and scheduled for surgery soon. Was instructed to come to ER if any incontinence issues. States she felt she could not feel stool coming out and has rectal and vaginal numbness. States hurried to restroom and did not feel she had control over BM. Pt is in [place] neck collar and body brace to limit movement.

## 2015-01-04 NOTE — ED Provider Notes (Signed)
CSN: [id number]     Arrival date & time 01/04/15  1249 History   First MD Initiated Contact with Patient 01/04/15 1505     Chief Complaint  Patient presents with  . Numbness     (Consider location/radiation/quality/duration/timing/severity/associated sxs/prior Treatment) HPI  This is a 54 year old female, with a history of degenerative disc disease, status post fusion of the lumbar spine, remote history of 4 level anterior cervical fusion from C3-C7, non-instrumented, recent MRI of the cervical and lumbar spines, which revealed disc bulge at C2-C3, causing significant stenosis, cord compression, and cord signal change, as well as disc bulging at L4-5 and L5-S1, causing foraminal narrowing, being seen by neurosurgery here for this, [facility] (Dr. Samuella CotaPrice), presenting today with concern for change in bowel movement. This occurred at 1:00 PM, at home.  She was sitting, relaxing, suddenly felt as though she might have a bowel movement. She ambulated quickly to the restroom, had a bowel movement. She said she could barely control it. Stool was firm at that time. She states that afterwards, when wiping her anus and vagina, her anus and vagina both felt completely numb. Otherwise, negative for weakness, numbness, tingling of the lower extremity, change and lower back pain, weakness, numbness, tingling of the upper extremities, change in speech. Negative for hematochezia or melena. Negative for chest pain, shortness of breath. Negative for fever or chills. The presenting symptoms have resolved, although the patient is unaware of how long it took for these symptoms to resolve.  Past Medical History  Diagnosis Date  . GERD (gastroesophageal reflux disease)   . Degenerative disc disease, lumbar   . Hypertension   . Mitral valve prolapse    Past Surgical History  Procedure Laterality Date  . Cholecystectomy    . Back surgery    . Abdominal hysterectomy    . Abdominal hysterectomy    .  Knee surgery     History reviewed. No pertinent family history. History  Substance Use Topics  . Smoking status: Former Smoker    Quit date: 09/16/1995  . Smokeless tobacco: Never Used  . Alcohol Use: Yes   OB History    Gravida Para Term Preterm AB TAB SAB Ectopic Multiple Living   2 2             Review of Systems  Constitutional: Negative for fever and chills.  HENT: Negative for facial swelling.   Eyes: Negative for photophobia and pain.  Respiratory: Negative for cough and shortness of breath.   Cardiovascular: Negative for chest pain and leg swelling.  Gastrointestinal: Positive for diarrhea (resolved). Negative for nausea, vomiting and abdominal pain.  Genitourinary: Negative for dysuria.  Musculoskeletal: Negative for arthralgias.  Skin: Negative for rash and wound.  Neurological: Positive for numbness (Resolved). Negative for seizures and weakness.  Hematological: Negative for adenopathy.      Allergies  Lisinopril; Moxifloxacin; and Sulfonamide derivatives  Home Medications   Prior to Admission medications   Medication Sig Start Date End Date Taking? Authorizing Provider  ALPRAZolam Prudy Feeler(XANAX) 1 MG tablet Take 1 tablet (1 mg total) by mouth at bedtime as needed for sleep or anxiety. 07/17/14  Yes Glori LuisEric G Sonnenberg, MD  aspirin EC 81 MG tablet Take 1 tablet (81 mg total) by mouth daily. 08/16/13  Yes Amber Nydia BoutonM Hairford, MD  atenolol (TENORMIN) 50 MG tablet TAKE ONE TABLET BY MOUTH ONCE DAILY 11/13/14  Yes Glori LuisEric G Sonnenberg, MD  cetirizine (ZYRTEC) 10 MG tablet Take 10 mg by  mouth daily as needed for allergies.    Yes Historical Provider, MD  cyclobenzaprine (FLEXERIL) 5 MG tablet Take 1 tablet (5 mg total) by mouth 3 (three) times daily as needed for muscle spasms. 02/13/14  Yes Amber Nydia Bouton, MD  fluticasone (FLONASE) 50 MCG/ACT nasal spray Place 2 sprays into both nostrils daily. Patient taking differently: Place 2 sprays into both nostrils daily as needed for  allergies.  10/05/13  Yes Amber Nydia Bouton, MD  hydrochlorothiazide (HYDRODIURIL) 25 MG tablet TAKE ONE TABLET BY MOUTH ONCE DAILY 11/21/14  Yes Glori Luis, MD  Multiple Vitamin (MULTIVITAMIN WITH MINERALS) TABS Take 1 tablet by mouth daily. Women's Multivitamin   Yes Historical Provider, MD  oxyCODONE-acetaminophen (PERCOCET) 10-325 MG per tablet Take 1 tablet by mouth every 4 (four) hours as needed for pain.   Yes Historical Provider, MD  albuterol (PROVENTIL HFA;VENTOLIN HFA) 108 (90 BASE) MCG/ACT inhaler Inhale 2 puffs into the lungs every 6 (six) hours as needed for wheezing or shortness of breath. Patient not taking: Reported on 01/04/2015 08/25/14   Barbaraann Barthel, MD  esomeprazole (NEXIUM) 40 MG capsule Take 1 capsule (40 mg total) by mouth daily. Patient not taking: Reported on 10/31/2014 02/13/14   Hilarie Fredrickson, MD  gabapentin (NEURONTIN) 300 MG capsule Take 1 capsule (300 mg total) by mouth 3 (three) times daily. Patient not taking: Reported on 01/04/2015 10/31/14   Marisa Severin, MD  imiquimod Mathis Dad) 5 % cream Apply topically 3 (three) times a week. Patient not taking: Reported on 10/31/2014 08/16/13   Hilarie Fredrickson, MD  meclizine (ANTIVERT) 25 MG tablet Take 1 tablet (25 mg total) by mouth 3 (three) times daily as needed for dizziness. Patient not taking: Reported on 10/17/2014 07/17/14   Glori Luis, MD  meloxicam (MOBIC) 7.5 MG tablet Take 1 tablet (7.5 mg total) by mouth daily. Patient not taking: Reported on 10/17/2014 05/30/13   Renne Crigler, PA-C  methocarbamol (ROBAXIN-750) 750 MG tablet Take 1 tablet (750 mg total) by mouth 4 (four) times daily. Patient not taking: Reported on 01/04/2015 10/31/14   Marisa Severin, MD  methylPREDNISolone (MEDROL DOSEPAK) 4 MG tablet Use as directed Patient not taking: Reported on 01/04/2015 10/31/14   Marisa Severin, MD  traMADol (ULTRAM) 50 MG tablet Take 1 tablet (50 mg total) by mouth every 6 (six) hours as needed. Patient not taking: Reported on  10/31/2014 10/17/14   Junius Finner, PA-C   BP 132/115 mmHg  Pulse 46  Temp(Src) 98.3 F (36.8 C) (Oral)  Resp 15  SpO2 100% Physical Exam  Constitutional: She is oriented to person, place, and time. She appears well-developed and well-nourished. No distress.  HENT:  Head: Normocephalic and atraumatic.  Mouth/Throat: No oropharyngeal exudate.  Eyes: Conjunctivae are normal. Pupils are equal, round, and reactive to light. No scleral icterus.  Neck: Normal range of motion. No tracheal deviation present. No thyromegaly present.  Cardiovascular: Normal rate, regular rhythm and normal heart sounds.  Exam reveals no gallop and no friction rub.   No murmur heard. Pulmonary/Chest: Effort normal and breath sounds normal. No stridor. No respiratory distress. She has no wheezes. She has no rales. She exhibits no tenderness.  Abdominal: Soft. She exhibits no distension and no mass. There is no tenderness. There is no rebound and no guarding.  Musculoskeletal: Normal range of motion. She exhibits no edema.  Neurological: She is alert and oriented to person, place, and time. She has normal strength. No cranial nerve deficit or  sensory deficit. GCS eye subscore is 4. GCS verbal subscore is 5. GCS motor subscore is 6.  Reflex Scores:      Patellar reflexes are 2+ on the right side and 2+ on the left side. Patient has 2+ reflexes in the bilateral lower extremities. She has full sensation in the anal, perianal, perivaginal areas. She has good rectal tone.  Skin: Skin is warm and dry. She is not diaphoretic.    ED Course  Procedures (including critical care time)  MDM   Final diagnoses:  Bowel movement symptom  History of sensory changes    This is a 54 year old female, with a history of degenerative disc disease, status post fusion of the lumbar spine, remote history of 4 level anterior cervical fusion from C3-C7, non-instrumented, recent MRI of the cervical and lumbar spines, which revealed disc  bulge at C2-C3, causing significant stenosis, cord compression, and cord signal change, as well as disc bulging at L4-5 and L5-S1, causing foraminal narrowing, being seen by neurosurgery here for this, ultimately referred to [facility] (Dr. Samuella Cota), presenting today with concern for change in bowel movement. This occurred at 1:00 PM, at home.  She was sitting, relaxing, suddenly felt as though she might have a bowel movement. She ambulated quickly to the restroom, had a bowel movement. She said she could barely control it. Stool was firm at that time. She states that afterwards, when wiping her anus and vagina, her anus and vagina both felt completely numb. Otherwise, negative for weakness, numbness, tingling of the lower extremity, change and lower back pain, weakness, numbness, tingling of the upper extremities, change in speech. Negative for hematochezia or melena. Negative for chest pain, shortness of breath. Negative for fever or chills. The presenting symptoms have resolved, although the patient is unaware of how long it took for these symptoms to resolve.  On my examination, vital signs are normal. Negative for abnormalities on cardiovascular or respiratory exams. Neurologic exam is within normal limits. Specifically, patient has no midline spinal tenderness of cervical, thoracic, or lumbar spines. She has absolutely no weakness, or sensory deficits of the lower extremities. She has good perivaginal as well as perianal sensation. She has 2+ reflexes bilaterally in the lower extremities. She has good rectal tone. At this time, considering recent MRI evidence of foraminal narrowing, fleeting symptomatology today, I will attempt to consult [facility] neurosurgery, as they have recommended performing ACDF in the near future. I will continue to monitor patient closely, follow up the recommendations. The patient is nothing by mouth at this time.  The patient's neurologic exam remains within normal limits. At this time, I  still do not suspect cauda equina or any other emergent spinal etiology for her presenting symptoms. I've spoken with Dr. Samuella Cota with you in see neurosurgery and he doubts emergent spinal etiology.  He recommends no further emergent or inpatient evaluation or treatment at this time. I discussed this with the patient and she is comparable with discharge. I have given her very strict return precautions and she expresses understanding. She will follow up with Dr. Kandice Hams office, as she has been approved by ENT for surgical intervention of her spinal stenosis.  I have discussed case and care has been guided by my attending physician, Dr. Loretha Stapler.  Loma Boston, MD 01/04/15 1731  Blake Divine, MD 01/04/15 1739

## 2015-01-04 NOTE — Discharge Instructions (Signed)
Sciatica °Sciatica is pain, weakness, numbness, or tingling along the path of the sciatic nerve. The nerve starts in the lower back and runs down the back of each leg. The nerve controls the muscles in the lower leg and in the back of the knee, while also providing sensation to the back of the thigh, lower leg, and the sole of your foot. Sciatica is a symptom of another medical condition. For instance, nerve damage or certain conditions, such as a herniated disk or bone spur on the spine, pinch or put pressure on the sciatic nerve. This causes the pain, weakness, or other sensations normally associated with sciatica. Generally, sciatica only affects one side of the body. °CAUSES  °· Herniated or slipped disc. °· Degenerative disk disease. °· A pain disorder involving the narrow muscle in the buttocks (piriformis syndrome). °· Pelvic injury or fracture. °· Pregnancy. °· Tumor (rare). °SYMPTOMS  °Symptoms can vary from mild to very severe. The symptoms usually travel from the low back to the buttocks and down the back of the leg. Symptoms can include: °· Mild tingling or dull aches in the lower back, leg, or hip. °· Numbness in the back of the calf or sole of the foot. °· Burning sensations in the lower back, leg, or hip. °· Sharp pains in the lower back, leg, or hip. °· Leg weakness. °· Severe back pain inhibiting movement. °These symptoms may get worse with coughing, sneezing, laughing, or prolonged sitting or standing. Also, being overweight may worsen symptoms. °DIAGNOSIS  °Your caregiver will perform a physical exam to look for common symptoms of sciatica. He or she may ask you to do certain movements or activities that would trigger sciatic nerve pain. Other tests may be performed to find the cause of the sciatica. These may include: °· Blood tests. °· X-rays. °· Imaging tests, such as an MRI or CT scan. °TREATMENT  °Treatment is directed at the cause of the sciatic pain. Sometimes, treatment is not necessary  and the pain and discomfort goes away on its own. If treatment is needed, your caregiver may suggest: °· Over-the-counter medicines to relieve pain. °· Prescription medicines, such as anti-inflammatory medicine, muscle relaxants, or narcotics. °· Applying heat or ice to the painful area. °· Steroid injections to lessen pain, irritation, and inflammation around the nerve. °· Reducing activity during periods of pain. °· Exercising and stretching to strengthen your abdomen and improve flexibility of your spine. Your caregiver may suggest losing weight if the extra weight makes the back pain worse. °· Physical therapy. °· Surgery to eliminate what is pressing or pinching the nerve, such as a bone spur or part of a herniated disk. °HOME CARE INSTRUCTIONS  °· Only take over-the-counter or prescription medicines for pain or discomfort as directed by your caregiver. °· Apply ice to the affected area for 20 minutes, 3-4 times a day for the first 48-72 hours. Then try heat in the same way. °· Exercise, stretch, or perform your usual activities if these do not aggravate your pain. °· Attend physical therapy sessions as directed by your caregiver. °· Keep all follow-up appointments as directed by your caregiver. °· Do not wear high heels or shoes that do not provide proper support. °· Check your mattress to see if it is too soft. A firm mattress may lessen your pain and discomfort. °SEEK IMMEDIATE MEDICAL CARE IF:  °· You lose control of your bowel or bladder (incontinence). °· You have increasing weakness in the lower back, pelvis, buttocks,   or legs.  You have redness or swelling of your back.  You have a burning sensation when you urinate.  You have pain that gets worse when you lie down or awakens you at night.  Your pain is worse than you have experienced in the past.  Your pain is lasting longer than 4 weeks.  You are suddenly losing weight without reason. MAKE SURE YOU:  Understand these  instructions.  Will watch your condition.  Will get help right away if you are not doing well or get worse. Document Released: 08/26/2001 Document Revised: 03/02/2012 Document Reviewed: 01/11/2012 [facility] Patient Information 2015 [employer] [place]. This information is not intended to replace advice given to you by your health care provider. Make sure you discuss any questions you have with your health care provider. Diarrhea Diarrhea is frequent loose and watery bowel movements. It can cause you to feel weak and dehydrated. Dehydration can cause you to become tired and thirsty, have a dry mouth, and have decreased urination that often is dark yellow. Diarrhea is a sign of another problem, most often an infection that will not last long. In most cases, diarrhea typically lasts 2-3 days. However, it can last longer if it is a sign of something more serious. It is important to treat your diarrhea as directed by your caregiver to lessen or prevent future episodes of diarrhea. CAUSES  Some common causes include:  Gastrointestinal infections caused by viruses, bacteria, or parasites.  Food poisoning or food allergies.  Certain medicines, such as antibiotics, chemotherapy, and laxatives.  Artificial sweeteners and fructose.  Digestive disorders. HOME CARE INSTRUCTIONS  Ensure adequate fluid intake (hydration): Have 1 cup (8 oz) of fluid for each diarrhea episode. Avoid fluids that contain simple sugars or sports drinks, fruit juices, whole milk products, and sodas. Your urine should be clear or pale yellow if you are drinking enough fluids. Hydrate with an oral rehydration solution that you can purchase at pharmacies, retail stores, and online. You can prepare an oral rehydration solution at home by mixing the following ingredients together:   - tsp table salt.   tsp baking soda.   tsp salt substitute containing potassium chloride.  1  tablespoons sugar.  1 L (34 oz) of water.  Certain  foods and beverages may increase the speed at which food moves through the gastrointestinal (GI) tract. These foods and beverages should be avoided and include:  Caffeinated and alcoholic beverages.  High-fiber foods, such as raw fruits and vegetables, nuts, seeds, and whole grain breads and cereals.  Foods and beverages sweetened with sugar alcohols, such as xylitol, sorbitol, and mannitol.  Some foods may be well tolerated and may help thicken stool including:  Starchy foods, such as rice, toast, pasta, low-sugar cereal, oatmeal, grits, baked potatoes, crackers, and bagels.  Bananas.  Applesauce.  Add probiotic-rich foods to help increase healthy bacteria in the GI tract, such as yogurt and fermented milk products.  Wash your hands well after each diarrhea episode.  Only take over-the-counter or prescription medicines as directed by your caregiver.  Take a warm bath to relieve any burning or pain from frequent diarrhea episodes. SEEK IMMEDIATE MEDICAL CARE IF:   You are unable to keep fluids down.  You have persistent vomiting.  You have blood in your stool, or your stools are black and tarry.  You do not urinate in 6-8 hours, or there is only a small amount of very dark urine.  You have abdominal pain that increases or localizes.  You have weakness, dizziness, confusion, or light-headedness.  You have a severe headache.  Your diarrhea gets worse or does not get better.  You have a fever or persistent symptoms for more than 2-3 days.  You have a fever and your symptoms suddenly get worse. MAKE SURE YOU:   Understand these instructions.  Will watch your condition.  Will get help right away if you are not doing well or get worse. Document Released: 08/22/2002 Document Revised: 01/16/2014 Document Reviewed: 05/09/2012 [facility] Patient Information 2015 [place], [place]. This information is not intended to replace advice given to you by your health care provider. Make  sure you discuss any questions you have with your health care provider.

## 2015-01-05 ENCOUNTER — Other Ambulatory Visit: Payer: Self-pay | Admitting: Family Medicine

## 2015-01-05 NOTE — Telephone Encounter (Signed)
Needs refill on alpralazam

## 2015-01-08 MED ORDER — ALPRAZOLAM 1 MG PO TABS: 1.0000 mg | ORAL_TABLET | Freq: Every evening | ORAL | Status: DC | PRN

## 2015-01-08 NOTE — Telephone Encounter (Signed)
Refills given. Patient needs follow-up in the next month for this issue.

## 2015-01-08 NOTE — Telephone Encounter (Signed)
Spoke with patient and she is aware of message. [name],CMA  

## 2015-02-26 ENCOUNTER — Other Ambulatory Visit: Payer: Self-pay | Admitting: *Deleted

## 2015-02-26 MED ORDER — FLUTICASONE PROPIONATE 50 MCG/ACT NA SUSP: 2.0000 | Freq: Every day | NASAL | Status: DC

## 2015-03-18 ENCOUNTER — Other Ambulatory Visit: Payer: Self-pay | Admitting: Family Medicine

## 2015-03-22 ENCOUNTER — Other Ambulatory Visit: Payer: Self-pay | Admitting: Family Medicine

## 2015-04-27 DIAGNOSIS — Z981 Arthrodesis status: Secondary | ICD-10-CM | POA: Insufficient documentation

## 2015-05-10 ENCOUNTER — Ambulatory Visit (INDEPENDENT_AMBULATORY_CARE_PROVIDER_SITE_OTHER): Payer: Medicaid Other | Admitting: Family Medicine

## 2015-05-10 ENCOUNTER — Encounter: Payer: Self-pay | Admitting: Family Medicine

## 2015-05-10 VITALS — BP 111/73 | HR 73 | Temp 97.3°F | Ht 66.0 in | Wt 196.0 lb

## 2015-05-10 DIAGNOSIS — Z09 Encounter for follow-up examination after completed treatment for conditions other than malignant neoplasm: Secondary | ICD-10-CM | POA: Diagnosis not present

## 2015-05-10 DIAGNOSIS — F411 Generalized anxiety disorder: Secondary | ICD-10-CM

## 2015-05-10 DIAGNOSIS — F419 Anxiety disorder, unspecified: Secondary | ICD-10-CM | POA: Diagnosis not present

## 2015-05-10 MED ORDER — ALPRAZOLAM 1 MG PO TABS: 1.0000 mg | ORAL_TABLET | Freq: Every evening | ORAL | Status: DC | PRN

## 2015-05-10 NOTE — Patient Instructions (Signed)
Thanks for coming in today.  It was great to meet you!  I have given you a refill on your xanax prescription with the understanding that we need to try a true approach to treating your anxiety in the future.   Continue to keep [place] updated on the plan regarding your surgeries in the future.   If you have any questions, or need any refills don't hesitate to call the clinic.   Schedule an appointment for 2 months to follow up on your anxiety.   Thanks for letting us take care of you!  Sincerely,  Devota Pace, MD Family Medicine - PGY 2

## 2015-05-10 NOTE — Progress Notes (Signed)
Patient ID: Mariah [name], female   DOB: August 10, 1961, 54 y.o.   MRN: [id number]    [facility] Yolande Jolly, MD Phone: [phone]  Subjective:   # Meet New Provider - Pt. Presents today for first visit with new provider.  - We reviewed her past medical, surgical, and social history.  - she is currently recovering from recent cervical spinal surgery which was successful and provided her with relief of her symptoms.  - She is currently being worked up for lumbar spinal surgery.  - She is ready to get her mammogram done, and also needs a screening colonoscopy.   # Generalized Anxiety - Pt. With hx of GAD here for refill of xanax. - She has been only treated with xanax for years and says that this has worked for her.  - She says she absolutely would not like to try psychotherapy - Additionally, she would not like to try any other medications - She has anxiety at baseline, and she takes the xanax whenever it gets too bad.  - She has no thoughts of SI/HI or depression symptoms.  - Additionally, she smokes marijuana for treatment of her symptoms.    All relevant systems were reviewed and were negative unless otherwise noted in the HPI  Past Medical History Reviewed problem list.  Medications- reviewed and updated Current Outpatient Prescriptions  Medication Sig Dispense Refill  . albuterol (PROVENTIL HFA;VENTOLIN HFA) 108 (90 BASE) MCG/ACT inhaler Inhale 2 puffs into the lungs every 6 (six) hours as needed for wheezing or shortness of breath. (Patient not taking: Reported on 01/04/2015) 1 Inhaler 0  . ALPRAZolam (XANAX) 1 MG tablet Take 1 tablet (1 mg total) by mouth at bedtime as needed for sleep or anxiety. 30 tablet 1  . aspirin EC 81 MG tablet Take 1 tablet (81 mg total) by mouth daily.    Marland Kitchen atenolol (TENORMIN) 50 MG tablet TAKE ONE TABLET BY MOUTH ONCE DAILY 90 tablet 0  . cetirizine (ZYRTEC) 10 MG tablet Take 10 mg by mouth daily as needed for allergies.      . cyclobenzaprine (FLEXERIL) 5 MG tablet Take 1 tablet (5 mg total) by mouth 3 (three) times daily as needed for muscle spasms. 30 tablet 1  . esomeprazole (NEXIUM) 40 MG capsule Take 1 capsule (40 mg total) by mouth daily. (Patient not taking: Reported on 10/31/2014) 30 capsule prn  . fluticasone (FLONASE) 50 MCG/ACT nasal spray Place 2 sprays into both nostrils daily. 16 g 11  . gabapentin (NEURONTIN) 300 MG capsule Take 1 capsule (300 mg total) by mouth 3 (three) times daily. (Patient not taking: Reported on 01/04/2015) 30 capsule 0  . hydrochlorothiazide (HYDRODIURIL) 25 MG tablet TAKE ONE TABLET BY MOUTH ONCE DAILY 90 tablet 0  . imiquimod (ALDARA) 5 % cream Apply topically 3 (three) times a week. (Patient not taking: Reported on 10/31/2014) 12 each 0  . meclizine (ANTIVERT) 25 MG tablet Take 1 tablet (25 mg total) by mouth 3 (three) times daily as needed for dizziness. (Patient not taking: Reported on 10/17/2014) 30 tablet 0  . meloxicam (MOBIC) 7.5 MG tablet Take 1 tablet (7.5 mg total) by mouth daily. (Patient not taking: Reported on 10/17/2014) 14 tablet 0  . methocarbamol (ROBAXIN-750) 750 MG tablet Take 1 tablet (750 mg total) by mouth 4 (four) times daily. (Patient not taking: Reported on 01/04/2015) 40 tablet 0  . methylPREDNISolone (MEDROL DOSEPAK) 4 MG tablet Use as directed (Patient not taking: Reported on  01/04/2015) 21 tablet 0  . Multiple Vitamin (MULTIVITAMIN WITH MINERALS) TABS Take 1 tablet by mouth daily. Women's Multivitamin    . oxyCODONE-acetaminophen (PERCOCET) 10-325 MG per tablet Take 1 tablet by mouth every 4 (four) hours as needed for pain.    . traMADol (ULTRAM) 50 MG tablet Take 1 tablet (50 mg total) by mouth every 6 (six) hours as needed. (Patient not taking: Reported on 10/31/2014) 15 tablet 0   No current facility-administered medications for this visit.   Chief complaint-noted No additions to family history Social history- patient is a non smoker of tobacco  products.  - smokes marijuana.   Objective: BP 111/73 mmHg  Pulse 73  Temp(Src) 97.3 F (36.3 C) (Oral)  Ht  (1.676 m)  Wt 196 lb (88.905 kg)  BMI 31.65 kg/m2 Gen: NAD, alert, cooperative with exam HEENT: NCAT, EOMI, PERR Neck: FROM, supple CV: RRR, good S1/S2, no murmur Resp: CTABL, no wheezes, non-labored Abd: SNTND, BS present, no guarding or organomegaly Ext: No edema, warm, normal tone, moves UE/LE spontaneously Neuro: Alert and oriented, No gross deficits Skin: no rashes no lesions  Assessment/Plan: See problem based a/p  Health Maintenance - needs mammogram - needs colonoscopy - follow up as needed.

## 2015-05-10 NOTE — Assessment & Plan Note (Signed)
Only treated with xanax which is not indicated as sole treatment of anxiety. Needs a true treatment plan. Patient is resistant to this, but I explained to her that in my medical opinion it is not in her best interest to continue to take xanax only for anxiety, especially as she gets older. I recommend coming up with an alternative treatment plant to truly treat her anxiety symptoms. Will refill xanax once and have her come back in 2 months for follow up regarding her anxiety where we will come up with a treatment plan. I clearly instructed her that I would only refill the xanax once.

## 2015-07-11 ENCOUNTER — Other Ambulatory Visit: Payer: Self-pay | Admitting: Family Medicine

## 2015-08-17 ENCOUNTER — Ambulatory Visit (INDEPENDENT_AMBULATORY_CARE_PROVIDER_SITE_OTHER): Payer: Medicaid Other | Admitting: Family Medicine

## 2015-08-17 ENCOUNTER — Encounter: Payer: Self-pay | Admitting: Family Medicine

## 2015-08-17 VITALS — BP 122/75 | HR 55 | Temp 98.3°F | Ht 66.0 in | Wt 204.1 lb

## 2015-08-17 DIAGNOSIS — R1084 Generalized abdominal pain: Secondary | ICD-10-CM

## 2015-08-17 DIAGNOSIS — J069 Acute upper respiratory infection, unspecified: Secondary | ICD-10-CM | POA: Diagnosis not present

## 2015-08-17 DIAGNOSIS — J302 Other seasonal allergic rhinitis: Secondary | ICD-10-CM | POA: Diagnosis not present

## 2015-08-17 DIAGNOSIS — R109 Unspecified abdominal pain: Secondary | ICD-10-CM | POA: Insufficient documentation

## 2015-08-17 NOTE — Assessment & Plan Note (Signed)
Non-specific/intermittent. Has a history of GERD so ulcers would be on the differential, but not currently related at all to eating. Also could be more benign etiology like flatulence. Recommended she continue to monitor and if it becomes more persistent, bothersome to return to clinic.

## 2015-08-17 NOTE — Assessment & Plan Note (Signed)
Mild, likely persisting symptoms related to poorly controlled allergies. There is some inner ear effusion but does not appear overtly infected and she is not complaining of any pain; I will not treat this with antibiotics at the current time but instructed if develops pain or other hearing issues to return to clinic. Follow up as needed.

## 2015-08-17 NOTE — Progress Notes (Signed)
   Subjective:    Patient ID: Mariah CornwallCynthia Y Simmons, female    DOB: 12-Nov-1960, 54 y.o.   MRN: [id number]  HPI  Patient presents for Same Day Appointment  CC: cold  # Cold symptoms:  Started approx 2 weeks ago  Has dry cough, bloody nose, also feels like ears are full and having some ear popping  Has a history of allergies, takes flonase but not an antihistamine. Does use some saline spray. ROS: no fevers, no CP, no SOB, no HA  # Abdominal pain  Present for past 1.5 months  Located left and right side  Feels sharp, lasts 20-30 minutes before it goes away  Doesn't happen every day  Feels random, doesn't notice anything bringing it on or making it feel better. No relation to eating ROS: no n/v/d  Social Hx: former smoker  Review of Systems   See HPI for ROS.   Past medical history, surgical, family, and social history reviewed and updated in the EMR as appropriate.  Objective:  BP 122/75 mmHg  Pulse 55  Temp(Src) 98.3 F (36.8 C) (Oral)  Ht 5\' 6"  (1.676 m)  Wt 204 lb 1.6 oz (92.579 kg)  BMI 32.96 kg/m2  SpO2 98% Vitals and nursing note reviewed  General: NAD, pleasant Eyes: normal conjunctiva and sclera ENTM: TMs obscured by cerumen and tortuous canal, after removal with curette there does appear to be some serous fluid behind both ear drums without erythema. Nasal turbinates are enlarged/erythematous bilaterally but no scab or bleeding noted.  CV: RRR, normal s1s2 no murmurs, rubs or gallop  Resp: clear to auscultation bilaterally normal effort Abdomen: soft, obese, nontender, nondistended, no guarding or rebound, normal bowel sounds  Assessment & Plan:  Allergic rhinitis Recommended added antihistamine to her regimen of flonase and nasal saline. Follow up as needed.  Acute upper respiratory infection Mild, likely persisting symptoms related to poorly controlled allergies. There is some inner ear effusion but does not appear overtly infected and she is not  complaining of any pain; I will not treat this with antibiotics at the current time but instructed if develops pain or other hearing issues to return to clinic. Follow up as needed.  Abdominal pain Non-specific/intermittent. Has a history of GERD so ulcers would be on the differential, but not currently related at all to eating. Also could be more benign etiology like flatulence. Recommended she continue to monitor and if it becomes more persistent, bothersome to return to clinic.

## 2015-08-17 NOTE — Patient Instructions (Signed)
Ear wax:  DEBROX - ear drops  Nosebleeds: Afrin nasal spray (decongestant) -- use this if it doesn't stop with pressure after 10 minutes.

## 2015-08-17 NOTE — Assessment & Plan Note (Signed)
Recommended added antihistamine to her regimen of flonase and nasal saline. Follow up as needed.

## 2015-09-21 ENCOUNTER — Telehealth: Payer: Self-pay | Admitting: Internal Medicine

## 2015-09-21 ENCOUNTER — Ambulatory Visit (INDEPENDENT_AMBULATORY_CARE_PROVIDER_SITE_OTHER): Payer: Medicaid Other | Admitting: Internal Medicine

## 2015-09-21 VITALS — BP 152/75 | HR 70 | Temp 97.8°F | Ht 66.0 in | Wt 208.0 lb

## 2015-09-21 DIAGNOSIS — K219 Gastro-esophageal reflux disease without esophagitis: Secondary | ICD-10-CM | POA: Diagnosis not present

## 2015-09-21 DIAGNOSIS — F411 Generalized anxiety disorder: Secondary | ICD-10-CM | POA: Diagnosis not present

## 2015-09-21 DIAGNOSIS — M479 Spondylosis, unspecified: Secondary | ICD-10-CM | POA: Insufficient documentation

## 2015-09-21 DIAGNOSIS — R490 Dysphonia: Secondary | ICD-10-CM | POA: Insufficient documentation

## 2015-09-21 MED ORDER — ALPRAZOLAM 1 MG PO TABS
1.0000 mg | ORAL_TABLET | Freq: Every evening | ORAL | Status: DC | PRN
Start: 2015-09-21 — End: 2015-09-21

## 2015-09-21 MED ORDER — FLUTICASONE PROPIONATE 50 MCG/ACT NA SUSP: 2.0000 | Freq: Every day | NASAL | Status: DC

## 2015-09-21 MED ORDER — HYDROCHLOROTHIAZIDE 25 MG PO TABS: 25.0000 mg | ORAL_TABLET | Freq: Every day | ORAL | Status: DC

## 2015-09-21 MED ORDER — ALPRAZOLAM 1 MG PO TABS
1.0000 mg | ORAL_TABLET | Freq: Every evening | ORAL | Status: DC | PRN
Start: 2015-09-21 — End: 2015-12-21

## 2015-09-21 MED ORDER — ATENOLOL 50 MG PO TABS: 50.0000 mg | ORAL_TABLET | Freq: Every day | ORAL | Status: DC

## 2015-09-21 NOTE — Telephone Encounter (Signed)
Mariah [name] just called, her appt isn't until 230pm. She states that she has an appt before that and she may be 10-15 min late. Mariah [name], ASA

## 2015-09-21 NOTE — Progress Notes (Signed)
Patient ID: Mariah CornwallCynthia Y [name], female   DOB: Oct 28, 1960, 55 y.o.   MRN: [id number] Date of Visit: 09/21/2015   HPI: Ms. Mariah [name] is here "to meet new PCP". She would like to discuss her heartburn today  Heartburn: - describes symptoms as epigastric pain and early satiety and belching.  - symptoms occur after meals and are worse with certain foods such as fried foods and pasta  - initially started in 2012 but symptoms seem to be worse the past 3-4 months  - Was on Nexium in the past but has not been able to afford this medication (pt has medicaid); she has not been on PPI since September. Has not tried other therapies at home - states Nexium allows her to belch which helps with her feeling of fullness but does not improve the epigastric pain - has never had an endoscopy. No history of PUD - no dysphagia or odynophagia  Of note, patient has elevated BP today at clinic. States she did not take her Anti-HTN medication today.   ROS: See HPI.  PMSFH:  No tobacco use (former smoker). Marijuana use  PHYSICAL EXAM: BP 152/75 mmHg  Pulse 70  Temp(Src) 97.8 F (36.6 C) (Oral)  Ht 5\' 6"  (1.676 m)  Wt 208 lb (94.348 kg)  BMI 33.59 kg/m2 Gen: NAD, sitting comfortably Heart: RRR, no m/r/g Lungs: normal effort, CTAB Abdomen: Soft, NT, ND, + BS Neuro: grossly intact  ASSESSMENT/PLAN:  GERD: Uncontrolled Due to long history of symptoms and no complete resolution of symptoms with medication, patient warrants further evaluation with Endoscopy. - GI referral made.   I also refilled the following chronic medications: Flonase, Xanax, and HCTZ  Health Maintenance: - GI referral made for screening colonoscopy as well  FOLLOW UP: Follow up for annual physical   Palma HolterKanishka G Gunadasa, MD PGY1 [facility]

## 2015-09-21 NOTE — Patient Instructions (Signed)
Please make an appointment for an annual physical

## 2015-09-21 NOTE — Telephone Encounter (Signed)
Will forward to MD to make her aware.  Shantrice Rodenberg,CMA  

## 2015-09-22 ENCOUNTER — Encounter: Payer: Self-pay | Admitting: Internal Medicine

## 2015-09-22 NOTE — Assessment & Plan Note (Signed)
Due to long history of symptoms and no complete resolution of symptoms with medication, patient warrants further evaluation with Endoscopy. - GI referral made.

## 2015-10-12 ENCOUNTER — Ambulatory Visit (INDEPENDENT_AMBULATORY_CARE_PROVIDER_SITE_OTHER): Payer: Medicaid Other | Admitting: Internal Medicine

## 2015-10-12 ENCOUNTER — Encounter: Payer: Self-pay | Admitting: Internal Medicine

## 2015-10-12 VITALS — BP 149/79 | HR 60 | Temp 98.0°F | Ht 66.0 in | Wt 208.0 lb

## 2015-10-12 DIAGNOSIS — R69 Illness, unspecified: Secondary | ICD-10-CM

## 2015-10-12 NOTE — Progress Notes (Signed)
Patient ID: Mariah [name], female   DOB: [birth year], 55 y.o.   MRN: [id number]  Patient unable to wait to be seen. Patient states she will reschedule this appointment.

## 2015-10-23 ENCOUNTER — Telehealth: Payer: Self-pay | Admitting: Internal Medicine

## 2015-10-23 DIAGNOSIS — M5136 Other intervertebral disc degeneration, lumbar region: Secondary | ICD-10-CM

## 2015-10-23 NOTE — Telephone Encounter (Signed)
Needs referral to [facility].  Going over there about her left  shoulder and right leg. Her referrals expired Jan 17.

## 2015-10-24 NOTE — Telephone Encounter (Signed)
Made referral for orthopedics

## 2015-10-24 NOTE — Telephone Encounter (Signed)
Will forward to MD to place referral. [name],CMA  

## 2015-11-02 ENCOUNTER — Encounter: Payer: Self-pay | Admitting: Internal Medicine

## 2015-11-02 ENCOUNTER — Ambulatory Visit (INDEPENDENT_AMBULATORY_CARE_PROVIDER_SITE_OTHER): Payer: Medicaid Other | Admitting: Internal Medicine

## 2015-11-02 VITALS — BP 126/63 | HR 68 | Temp 98.1°F | Ht 66.0 in | Wt 210.6 lb

## 2015-11-02 DIAGNOSIS — E119 Type 2 diabetes mellitus without complications: Secondary | ICD-10-CM

## 2015-11-02 DIAGNOSIS — Z131 Encounter for screening for diabetes mellitus: Secondary | ICD-10-CM

## 2015-11-02 DIAGNOSIS — Z0000 Encounter for general adult medical examination without abnormal findings: Secondary | ICD-10-CM

## 2015-11-02 DIAGNOSIS — Z23 Encounter for immunization: Secondary | ICD-10-CM

## 2015-11-02 DIAGNOSIS — Z13220 Encounter for screening for lipoid disorders: Secondary | ICD-10-CM | POA: Diagnosis not present

## 2015-11-02 LAB — COMPLETE METABOLIC PANEL WITH GFR
ALT: 19 U/L (ref 6–29)
AST: 18 U/L (ref 10–35)
Albumin: 4.5 g/dL (ref 3.6–5.1)
Alkaline Phosphatase: 79 U/L (ref 33–130)
BUN: 12 mg/dL (ref 7–25)
CO2: 26 mmol/L (ref 20–31)
Calcium: 9.5 mg/dL (ref 8.6–10.4)
Chloride: 101 mmol/L (ref 98–110)
Creat: 0.8 mg/dL (ref 0.50–1.05)
GFR, Est African American: >89 mL/min (ref >=60)
GFR, Est Non African American: 84 mL/min (ref >=60)
Glucose, Bld: 124 mg/dL — ABNORMAL HIGH (ref 65–99)
Potassium: 4 mmol/L (ref 3.5–5.3)
Sodium: 135 mmol/L (ref 135–146)
Total Bilirubin: 0.4 mg/dL (ref 0.2–1.2)
Total Protein: 7.2 g/dL (ref 6.1–8.1)

## 2015-11-02 LAB — CBC
HCT: 41.4 % (ref 36.0–46.0)
Hemoglobin: 13.6 g/dL (ref 12.0–15.0)
MCH: 27.8 pg (ref 26.0–34.0)
MCHC: 32.9 g/dL (ref 30.0–36.0)
MCV: 84.7 fL (ref 78.0–100.0)
MPV: 10.5 fL (ref 8.6–12.4)
Platelets: 279 10*3/uL (ref 150–400)
RBC: 4.89 MIL/uL (ref 3.87–5.11)
RDW: 14.9 % (ref 11.5–15.5)
WBC: 9.6 10*3/uL (ref 4.0–10.5)

## 2015-11-02 LAB — LIPID PANEL
Cholesterol: 246 mg/dL — ABNORMAL HIGH (ref 125–200)
HDL: 44 mg/dL — ABNORMAL LOW (ref >=46)
Total CHOL/HDL Ratio: 5.6 Ratio — ABNORMAL HIGH (ref <=5.0)
Triglycerides: 485 mg/dL — ABNORMAL HIGH (ref <150)

## 2015-11-02 LAB — POCT GLYCOSYLATED HEMOGLOBIN (HGB A1C): Hemoglobin A1C: 6.6

## 2015-11-02 NOTE — Assessment & Plan Note (Signed)
Newly Diagnosed at this visit. We discussed the importance of diet and lifestyle changes to manage this new diagnosis. Patient would like to do lifestyle changes prior to starting medication.  - discussed goal of 150 minutes of physical activity per week - patient interested in seeing Nutrition. Referral made to Dr. Gerilyn Pilgrim. Contact information given to patient - handout given to patient about nutrition; also discussed [email or web address] - follow up in 4 months

## 2015-11-02 NOTE — Patient Instructions (Addendum)
You can also look into [employer] to help with health eating.   Prediabetes Eating Plan Prediabetes--also called impaired glucose tolerance or impaired fasting glucose--is a condition that causes blood sugar (blood glucose) levels to be higher than normal. Following a healthy diet can help to keep prediabetes under control. It can also help to lower the risk of type 2 diabetes and heart disease, which are increased in people who have prediabetes. Along with regular exercise, a healthy diet:  Promotes weight loss.  Helps to control blood sugar levels.  Helps to improve the way that the body uses insulin. WHAT DO I NEED TO KNOW ABOUT THIS EATING PLAN?  Use the glycemic index (GI) to plan your meals. The index tells you how quickly a food will raise your blood sugar. Choose low-GI foods. These foods take a longer time to raise blood sugar.  Pay close attention to the amount of carbohydrates in the food that you eat. Carbohydrates increase blood sugar levels.  Keep track of how many calories you take in. Eating the right amount of calories will help you to achieve a healthy weight. Losing about 7 percent of your starting weight can help to prevent type 2 diabetes.  You may want to follow a Mediterranean diet. This diet includes a lot of vegetables, lean meats or fish, whole grains, fruits, and healthy oils and fats. WHAT FOODS CAN I EAT? Grains Whole grains, such as whole-wheat or whole-grain breads, crackers, cereals, and pasta. Unsweetened oatmeal. Bulgur. Barley. Quinoa. Brown rice. Corn or whole-wheat flour tortillas or taco shells. Vegetables Lettuce. Spinach. Peas. Beets. Cauliflower. Cabbage. Broccoli. Carrots. Tomatoes. Squash. Eggplant. Herbs. Peppers. Onions. Cucumbers. Brussels sprouts. Fruits Berries. Bananas. Apples. Oranges. Grapes. Papaya. Mango. Pomegranate. Kiwi. Grapefruit. Cherries. Meats and Other Protein Sources Seafood. Lean meats, such as chicken and [place] or  lean cuts of pork and beef. Tofu. Eggs. Nuts. Beans. Dairy Low-fat or fat-free dairy products, such as yogurt, cottage cheese, and cheese. Beverages Water. Tea. Coffee. Sugar-free or diet soda. Seltzer water. Milk. Milk alternatives, such as soy or almond milk. Condiments Mustard. Relish. Low-fat, low-sugar ketchup. Low-fat, low-sugar barbecue sauce. Low-fat or fat-free mayonnaise. Sweets and Desserts Sugar-free or low-fat pudding. Sugar-free or low-fat ice cream and other frozen treats. Fats and Oils Avocado. Walnuts. Olive oil. The items listed above may not be a complete list of recommended foods or beverages. Contact your dietitian for more options.  WHAT FOODS ARE NOT RECOMMENDED? Grains Refined white flour and flour products, such as bread, pasta, snack foods, and cereals. Beverages Sweetened drinks, such as sweet iced tea and soda. Sweets and Desserts Baked goods, such as cake, cupcakes, pastries, cookies, and cheesecake. The items listed above may not be a complete list of foods and beverages to avoid. Contact your dietitian for more information.   This information is not intended to replace advice given to you by your health care provider. Make sure you discuss any questions you have with your health care provider.   Document Released: 01/16/2015 Document Reviewed: 01/16/2015 Elsevier Interactive Patient Education [employer]

## 2015-11-02 NOTE — Progress Notes (Signed)
Patient ID: Mariah [name], female   DOB: June 16, 1961, 55 y.o.   MRN: [id number] Date of Visit: 11/02/2015   HPI:  Patient presents today for a well woman exam.   Concerns today: no concerns  Periods: last period 2008.  Contraception: none; total hysterectomy for fibroids Pelvic symptoms: no vaginal discharge, odor, or discharge Sexual activity: yes  STD Screening: no concerns Pap smear status: total hysterectomy in 2008 for fibroids Exercise: no regular exercise currently. Back pain and sciatia.Trying to get back into the gym.  Diet: fried foods every 2 weeks, baked or grilled chicken fish, [place]; vegetables; difficulty with carbs but trying to cut back. No snacking. 2 meals a day at most; mainly water, sodas "rarely" Smoking: no tobacco; daily marijuana  Alcohol: 2 beers every few day Drugs: none Advance directives: not discussed Dentist: July 2016; goes yearly  Eyes: will set up an appointment this year  ROS: Denies chest pain, shortness of breath, LE swelling, constipation, diarrhea, dysuria, vaginal discharge  PMFSH:  Cancers in family: Father-lung cancer; Mother- leukemia   PHYSICAL EXAM: BP 126/63 mmHg  Pulse 68  Temp(Src) 98.1 F (36.7 C) (Oral)  Ht  (1.676 m)  Wt 210 lb 9.6 oz (95.528 kg)  BMI 34.01 kg/m2 Gen: NAD, pleasant, cooperative HEENT: NCAT, PERRL, no palpable thyromegaly or anterior cervical lymphadenopathy Heart: RRR, no murmurs Lungs: CTAB, NWOB Abdomen: soft, nontender to palpation Neuro: grossly nonfocal, speech normal GU: normal appearing external genitalia without lesions. Vagina is moist with white discharge. Cervix normal in appearance. No cervical motion tenderness or tenderness on bimanual exam. No adnexal masses.   ASSESSMENT/PLAN:  # Health maintenance:  -STD screening: declined; she is married; also declined Hep C screening -pap smear: no indicated; patient had total hysterectomy for symptomatic fibroids in 10/2006 -mammogram: given  contact information to make an appointment -lipid screening: ordered today  -DEXA: not indicated  -immunizations: tdap today (last Td in 2006) -advance directives: not discussed - CBC and CMP ordered as well as last done 1 year ago - discussed the importance of regular physical activity; patient reported desire to go to the gym again  - colonoscopy: referral made and has an appointment next week  Diabetes mellitus (HCC) Newly Diagnosed at this visit. We discussed the importance of diet and lifestyle changes to manage this new diagnosis. Patient would like to do lifestyle changes prior to starting medication.  - discussed goal of 150 minutes of physical activity per week - patient interested in seeing Nutrition. Referral made to Dr. Gerilyn Pilgrim. Contact information given to patient - handout given to patient about nutrition; also discussed [email or web address] - follow up in 4 months    FOLLOW UP: Follow up in 4 months for Newly Diagnosed DM  Palma Holter, MD PGY 1 Family Medicine

## 2015-11-04 ENCOUNTER — Telehealth: Payer: Self-pay | Admitting: Internal Medicine

## 2015-11-04 NOTE — Telephone Encounter (Signed)
Called Mariah [name] to discuss lab results, especially elevated cholesterol levels. Patient would like to make diet and lifestyle changes before starting on statin. Will follow up for this in 3-6 months. Patient is already taking ASA 81 mg daily. I asked her to continue this.

## 2015-11-06 ENCOUNTER — Other Ambulatory Visit: Payer: Self-pay

## 2015-11-06 DIAGNOSIS — Z1231 Encounter for screening mammogram for malignant neoplasm of breast: Secondary | ICD-10-CM

## 2015-11-21 ENCOUNTER — Ambulatory Visit
Admission: RE | Admit: 2015-11-21 | Discharge: 2015-11-21 | Disposition: A | Discharge status: Home / Self Care | Payer: Medicaid Other | Source: Ambulatory Visit

## 2015-11-21 DIAGNOSIS — Z1231 Encounter for screening mammogram for malignant neoplasm of breast: Secondary | ICD-10-CM

## 2015-11-27 ENCOUNTER — Encounter: Payer: Medicaid Other | Attending: Family Medicine

## 2015-11-27 VITALS — Ht 66.0 in | Wt 203.7 lb

## 2015-11-27 DIAGNOSIS — E119 Type 2 diabetes mellitus without complications: Secondary | ICD-10-CM | POA: Diagnosis not present

## 2015-12-03 NOTE — Progress Notes (Signed)
Patient was seen on 11-27-15 for the first of a series of three diabetes self-management courses at [facility].  Patient Education Plan per assessed needs and concerns is to attend four course education program for Diabetes Self Management Education.  The following learning objectives were met by the patient during this class:  Describe diabetes  State some common risk factors for diabetes  Defines the role of glucose and insulin  Identifies type of diabetes and pathophysiology  Describe the relationship between diabetes and cardiovascular risk  State the members of the Healthcare Team  States the rationale for glucose monitoring  State when to test glucose  State their individual Target Range  State the importance of logging glucose readings  Describe how to interpret glucose readings  Identifies A1C target  Explain the correlation between A1c and eAG values  State symptoms and treatment of high blood glucose  State symptoms and treatment of low blood glucose  Explain proper technique for glucose testing  Identifies proper sharps disposal  Handouts given during class include:  Living Well with Diabetes book  Carb Counting and Meal Planning book  Meal Plan Card  Carbohydrate guide  Meal planning worksheet  Low Sodium Flavoring Tips  The diabetes portion plate  A1c to eAG Conversion Chart  Diabetes Medications  Diabetes Recommended Care Schedule  Support Group  Diabetes Success Plan  Core Class Satisfaction Survey  Follow-Up Plan:  Attend core 2    

## 2015-12-04 DIAGNOSIS — E119 Type 2 diabetes mellitus without complications: Secondary | ICD-10-CM | POA: Diagnosis not present

## 2015-12-04 NOTE — Progress Notes (Signed)

## 2015-12-11 ENCOUNTER — Ambulatory Visit: Payer: Self-pay

## 2015-12-21 ENCOUNTER — Other Ambulatory Visit: Payer: Self-pay | Admitting: Internal Medicine

## 2015-12-24 NOTE — Telephone Encounter (Signed)
Will touch base with patient that she needs to be seen for anxiety as this medication is usually not used chronically. Will only prescribe #10 tabs.

## 2016-01-02 ENCOUNTER — Other Ambulatory Visit: Payer: Self-pay | Admitting: Internal Medicine

## 2016-01-03 ENCOUNTER — Ambulatory Visit: Payer: Self-pay | Admitting: Internal Medicine

## 2016-01-04 ENCOUNTER — Ambulatory Visit: Payer: Self-pay | Admitting: Internal Medicine

## 2016-01-16 ENCOUNTER — Ambulatory Visit (INDEPENDENT_AMBULATORY_CARE_PROVIDER_SITE_OTHER): Payer: Medicaid Other | Admitting: Internal Medicine

## 2016-01-16 ENCOUNTER — Encounter: Payer: Self-pay | Admitting: Internal Medicine

## 2016-01-16 VITALS — BP 122/80 | HR 74 | Temp 97.8°F | Ht 66.0 in | Wt 195.0 lb

## 2016-01-16 DIAGNOSIS — R69 Illness, unspecified: Secondary | ICD-10-CM

## 2016-01-16 DIAGNOSIS — IMO0001 Reserved for inherently not codable concepts without codable children: Secondary | ICD-10-CM

## 2016-01-16 NOTE — Progress Notes (Signed)
Patient ID: Mariah [name], female   DOB: 11/06/1960, 55 y.o.   MRN: [id number] Date of Visit: 01/16/2016   HPI:  Patients to clinic today to discuss DM, chronic intermittent skin boils, and her xanax use for anxiety and insomnia.   Patient was short and irritable even at the beginning of the visit. During history taking, she repeatedly stated that all the questions that I was asking her were in her chart and there was no use asking again. She reported that she had reported of her chronic history of intermittent boils to me the past two times but I did not address this. Of note her first visit, she wanted to discuss her heartburn. The second visit was a wellness visit during which she was diagnosed with DM. Of note she briefly mentioned to be about her history of skin boils during the wellness visit but reported she did not have lesions at that time. Due to the type of visit, her new DM visit, and since she reported she did not have lesions at that point, I stated that it would be best if we discuss this at follow up visit; at that time, she did agree to this. However, today she continued to state how upset she was about this. She was also upset that I did not check her feet on the wellness visit at which she was diagnosed with diabetes ans she states that she was told that her provider should check her feet at each visit. I had a difficult time redirecting her to actually discuss these issues she came in for. The following is the history I did obtain.   DM: - reports she went to diabetes classes which were helpful - has an optometry appointment next week  Chronic Intermittent Skin Boils: - reports of intermittent lesions for years which occur on her buttock, vaginal area, and abdomen - reports she has not tried any interventions - no aggravating/alleviating factors - current lesion on her right lower abdomen for the past 1.5 weeks which is improving without intervention - also notes of the  beginnings of a small boil near the lesion noted above - reports it starts off as a bump and gets larger, denies drainage from lesions, then after a while self-resolves; notes of soreness on these lesions at times - reports no lesions on vaginal area or buttock currently  - reports she has seen Dr. Allyne Gee for this;when asked what he diagnosed her with or what interventions he tried, patient refused to answer because she "wanted to know what [ I ] thought"  Xanax use for anxiety and sleep: - reports she uses this medication for anxiety and sleep issues she has that is related to her back surgery and her neck surgery - she reports she does not understand why I wanted to discuss about this medication and her use when she has been using this for years. I stated that I simply wanted to discuss the reasons for taking this medication and how often she is needing to take it - reports she only took about 4-5 pills last month  ROS: See HPI.  PMFSH:  DM  PHYSICAL EXAM: BP 122/80 mmHg  Pulse 74  Temp(Src) 97.8 F (36.6 C) (Oral)  Ht  (1.676 m)  Wt 195 lb (88.451 kg)  BMI 31.49 kg/m2 Skin: 4cm x 3cm indurated area on right lower abdomen with mild erythema and without fluctuance. Small bump also noted next to the larger lesion. No tenderness to palpation.  At this point I felt ill and stepped out. One of the supervising attendings went to the room to complete visit, however patient declined and left the clinic.   Palma HolterKanishka G Shaydon Lease, MD PGY -1 [facility]

## 2016-01-30 ENCOUNTER — Other Ambulatory Visit: Payer: Self-pay | Admitting: Internal Medicine

## 2016-02-04 ENCOUNTER — Other Ambulatory Visit: Payer: Self-pay | Admitting: Internal Medicine

## 2016-02-05 NOTE — Telephone Encounter (Signed)
-----   Message from Palma HolterKanishka G Gunadasa, MD sent at 02/05/2016  8:03 AM EDT ----- Regarding: Med refill request Please call patient to let her know that her prescription (xanax) is printed and available for pick up at the front desk.

## 2016-02-05 NOTE — Telephone Encounter (Signed)
Informed pt of rx at the front ready for pick up. Page, cma.

## 2016-02-05 NOTE — Telephone Encounter (Signed)
Prescription printed and placed at front desk for patient to pick up.

## 2016-02-15 ENCOUNTER — Telehealth: Payer: Self-pay | Admitting: *Deleted

## 2016-02-15 DIAGNOSIS — M5136 Other intervertebral disc degeneration, lumbar region: Secondary | ICD-10-CM

## 2016-02-15 NOTE — Telephone Encounter (Signed)
Per chart review (under the referrals tab), patient has an active referral which was authorized (expiration date 04/21/16). Please let me know if I need to make a new referral.

## 2016-02-15 NOTE — Telephone Encounter (Signed)
Patient called and needs a new [facility]  She was told that her previous referral ran out in May.  Patient would like to be contacted when this has been done so she can call and make her appt. [name],CMA

## 2016-02-18 NOTE — Telephone Encounter (Signed)
[facility]. No referral needed.

## 2016-02-18 NOTE — Telephone Encounter (Signed)
Yes patient needs a new referral since her visits ran out on her other one.  [name],CMA

## 2016-02-29 ENCOUNTER — Telehealth: Payer: Self-pay | Admitting: *Deleted

## 2016-02-29 NOTE — Telephone Encounter (Signed)
Called patient and discussed changing her PCP based on her last office visit with Dr. Ottie GlazierGunadasa.  Patient is ok with us changing PCP to Dr. Nancy [facility]usMayo as [facility].  Patient will follow up with me if she has any problems or concerns with PCP change.  Altamese Dilling~Margie Brink, BSN, RN-BC

## 2016-03-07 DIAGNOSIS — R0981 Nasal congestion: Secondary | ICD-10-CM | POA: Diagnosis present

## 2016-03-07 DIAGNOSIS — E119 Type 2 diabetes mellitus without complications: Secondary | ICD-10-CM | POA: Insufficient documentation

## 2016-03-07 DIAGNOSIS — I10 Essential (primary) hypertension: Secondary | ICD-10-CM | POA: Diagnosis not present

## 2016-03-07 DIAGNOSIS — Z87891 Personal history of nicotine dependence: Secondary | ICD-10-CM | POA: Insufficient documentation

## 2016-03-07 DIAGNOSIS — J0110 Acute frontal sinusitis, unspecified: Secondary | ICD-10-CM | POA: Diagnosis not present

## 2016-03-07 DIAGNOSIS — Z7982 Long term (current) use of aspirin: Secondary | ICD-10-CM | POA: Insufficient documentation

## 2016-03-07 DIAGNOSIS — J0100 Acute maxillary sinusitis, unspecified: Secondary | ICD-10-CM | POA: Insufficient documentation

## 2016-03-08 ENCOUNTER — Emergency Department (HOSPITAL_COMMUNITY)
Admission: EM | Admit: 2016-03-08 | Discharge: 2016-03-08 | Disposition: A | Discharge status: Home / Self Care | Payer: Medicaid Other | Attending: Emergency Medicine | Admitting: Emergency Medicine

## 2016-03-08 ENCOUNTER — Encounter (HOSPITAL_COMMUNITY): Payer: Self-pay

## 2016-03-08 DIAGNOSIS — R0981 Nasal congestion: Secondary | ICD-10-CM

## 2016-03-08 MED ORDER — FLUTICASONE PROPIONATE 50 MCG/ACT NA SUSP: 2.0000 | Freq: Every day | NASAL | Status: DC

## 2016-03-08 MED ORDER — AMOXICILLIN-POT CLAVULANATE 875-125 MG PO TABS: 1.0000 | ORAL_TABLET | Freq: Two times a day (BID) | ORAL | Status: DC

## 2016-03-08 MED ORDER — LORATADINE 10 MG PO TABS: 10.0000 mg | ORAL_TABLET | Freq: Every day | ORAL | Status: DC

## 2016-03-08 NOTE — ED Notes (Signed)
Pt states nasal congestion on right side. Complaining of pressure and nasal drainage x 1 week. Pt states has PCP appointment on July 12. Denies any fevers or cough.

## 2016-03-08 NOTE — ED Provider Notes (Signed)
CSN: [id number]     Arrival date & time 03/07/16  2321 History   First MD Initiated Contact with Patient 03/08/16 0023     Chief Complaint  Patient presents with  . Nasal Congestion     (Consider location/radiation/quality/duration/timing/severity/associated sxs/prior Treatment) HPI   Mariah [name] is a 55 y.o. female, with a history of DM and hypertension, presenting to the ED with Nasal congestion, runny nose, and sinus pressure for the last week. Patient states the congestion and pressure is worse on the right. Patient has tried saline nasal spray and Flonase intermittently with some relief. Patient denies fever/chills, nausea/vomiting, shortness of breath, or any other complaints.    Past Medical History  Diagnosis Date  . GERD (gastroesophageal reflux disease)   . Degenerative disc disease, lumbar   . Hypertension   . Mitral valve prolapse   . Diabetes mellitus without complication [facility](HCC)    Past Surgical History  Procedure Laterality Date  . Cholecystectomy    . Back surgery    . Abdominal hysterectomy    . Abdominal hysterectomy    . Knee surgery     History reviewed. No pertinent family history. Social History  Substance Use Topics  . Smoking status: Former Smoker    Quit date: 09/16/1995  . Smokeless tobacco: Never Used  . Alcohol Use: Yes   OB History    Gravida Para Term Preterm AB TAB SAB Ectopic Multiple Living   2 2             Review of Systems  Constitutional: Negative for fever and chills.  HENT: Positive for congestion, rhinorrhea and sinus pressure. Negative for facial swelling and sore throat.   Respiratory: Negative for shortness of breath.   Gastrointestinal: Negative for nausea and vomiting.  All other systems reviewed and are negative.     Allergies  Lisinopril; Moxifloxacin; and Sulfonamide derivatives  Home Medications   Prior to Admission medications   Medication Sig Start Date End Date Taking? Authorizing Provider   ALPRAZolam Prudy Feeler(XANAX) 1 MG tablet TAKE ONE TABLET BY MOUTH AT BEDTIME AS NEEDED FOR SLEEP OR ANXIETY 02/05/16   Palma HolterKanishka G Gunadasa, MD  amoxicillin-clavulanate (AUGMENTIN) 875-125 MG tablet Take 1 tablet by mouth every 12 (twelve) hours. 03/08/16   Anselm PancoastShawn C Shahara Hartsfield, PA-C  aspirin EC 81 MG tablet Take 1 tablet (81 mg total) by mouth daily. 08/16/13   Amber Nydia BoutonM Hairford, MD  atenolol (TENORMIN) 50 MG tablet TAKE ONE TABLET BY MOUTH  DAILY 02/05/16   Palma HolterKanishka G Gunadasa, MD  cetirizine (ZYRTEC) 10 MG tablet Take 10 mg by mouth daily as needed for allergies.     Historical Provider, MD  cyclobenzaprine (FLEXERIL) 5 MG tablet Take 1 tablet (5 mg total) by mouth 3 (three) times daily as needed for muscle spasms. 02/13/14   Amber Nydia BoutonM Hairford, MD  esomeprazole (NEXIUM) 40 MG capsule Take 1 capsule (40 mg total) by mouth daily. Patient not taking: Reported on 12/03/2015 02/13/14   Hilarie FredricksonAmber M Hairford, MD  fluticasone (FLONASE) 50 MCG/ACT nasal spray Place 2 sprays into both nostrils daily. 09/21/15   Palma HolterKanishka G Gunadasa, MD  fluticasone (FLONASE) 50 MCG/ACT nasal spray Place 2 sprays into both nostrils daily. 03/08/16   Almin Livingstone C Primitivo Merkey, PA-C  gabapentin (NEURONTIN) 300 MG capsule Take 1 capsule (300 mg total) by mouth 3 (three) times daily. Patient not taking: Reported on 01/04/2015 10/31/14   Marisa Severinlga Otter, MD  hydrochlorothiazide (HYDRODIURIL) 25 MG tablet TAKE ONE TABLET BY MOUTH  DAILY 01/02/16   Palma HolterKanishka G Gunadasa, MD  imiquimod (ALDARA) 5 % cream Apply topically 3 (three) times a week. Patient not taking: Reported on 10/31/2014 08/16/13   Hilarie FredricksonAmber M Hairford, MD  loratadine (CLARITIN) 10 MG tablet Take 1 tablet (10 mg total) by mouth daily. 03/08/16   Olney Monier C Nekisha Mcdiarmid, PA-C  Multiple Vitamin (MULTIVITAMIN WITH MINERALS) TABS Take 1 tablet by mouth daily. Reported on 11/02/2015    Historical Provider, MD   BP 118/70 mmHg  Pulse 61  Temp(Src) 98.1 F (36.7 C) (Oral)  Resp 19  Ht 5\' 6"  (1.676 m)  Wt 87.573 kg  BMI 31.18 kg/m2  SpO2  98% Physical Exam  Constitutional: She appears well-developed and well-nourished. No distress.  HENT:  Head: Normocephalic and atraumatic.  Nose: No nasal septal hematoma. Right sinus exhibits maxillary sinus tenderness and frontal sinus tenderness. Left sinus exhibits maxillary sinus tenderness and frontal sinus tenderness.  Mouth/Throat: Oropharynx is clear and moist.  Swollen, boggy turbinates bilaterally, worse on the right.  Eyes: Conjunctivae are normal.  Neck: Neck supple.  Cardiovascular: Normal rate, regular rhythm and intact distal pulses.   Pulmonary/Chest: Effort normal and breath sounds normal. No respiratory distress.  Abdominal: Soft. There is no tenderness. There is no guarding.  Musculoskeletal: She exhibits no edema or tenderness.  Lymphadenopathy:    She has no cervical adenopathy.  Neurological: She is alert.  Skin: Skin is warm and dry. She is not diaphoretic.  Psychiatric: She has a normal mood and affect. Her behavior is normal.  Nursing note and vitals reviewed.   ED Course  Procedures (including critical care time)   MDM   Final diagnoses:  Nasal congestion    Mariah [name] presents with nasal congestion for the last week.  Suspect seasonal allergies versus sinusitis. Patient is nontoxic appearing and has no red flag symptoms. Regardless of this, due to the patient's complaint that the congestion and pain is worse on one side, ENT resources given. Home care instructions and return precautions discussed. Patient voiced understanding of these instructions and is comfortable with discharge.  Filed Vitals:   03/07/16 2359 03/08/16 0059  BP: 118/70 164/83  Pulse: 61 60  Temp: 98.1 F (36.7 C) 97.7 F (36.5 C)  TempSrc: Oral Oral  Resp: 19 15  Height: 5\' 6"  (1.676 m)   Weight: 87.573 kg   SpO2: 98% 100%     Anselm PancoastShawn C Pruitt Taboada, PA-C 03/08/16 1819  Kristen N Ward, DO 03/08/16 2314

## 2016-03-08 NOTE — Discharge Instructions (Signed)
You have been seen today for nasal congestion. Please try taking the following: 10 mg Claritin daily, saline nasal spray 2 sprays each nostril twice daily, Flonase 2 sprays per nostril daily, and add the antibiotic. Please take all of your antibiotics until finished!   You may develop abdominal discomfort or diarrhea from the antibiotic.  You may help offset this with probiotics which you can buy or get in yogurt. Do not eat or take the probiotics until 2 hours after your antibiotic. Follow-up with ENT should symptoms fail to resolve. Follow up with PCP as needed. Return to ED should symptoms worsen.

## 2016-03-26 ENCOUNTER — Ambulatory Visit: Payer: Self-pay | Admitting: Internal Medicine

## 2016-03-27 ENCOUNTER — Other Ambulatory Visit: Payer: Self-pay | Admitting: Internal Medicine

## 2016-03-27 NOTE — Telephone Encounter (Signed)
Needs refills on: atenolol, HCTZ, alprazolam.  [facility] [place]. Pt had an appt 03-26-16 but thought it was 7-13. She came on the 13 on time. Another appt was scheduled 04-28-16.f

## 2016-03-28 MED ORDER — ALPRAZOLAM 1 MG PO TABS: ORAL_TABLET | ORAL | Status: DC

## 2016-03-28 MED ORDER — HYDROCHLOROTHIAZIDE 25 MG PO TABS: ORAL_TABLET | ORAL | Status: DC

## 2016-03-28 MED ORDER — ATENOLOL 50 MG PO TABS: ORAL_TABLET | ORAL | Status: DC

## 2016-03-28 NOTE — Telephone Encounter (Signed)
Pt informed of RX up front. Page, cma.

## 2016-03-28 NOTE — Telephone Encounter (Signed)
Medications refilled

## 2016-04-20 ENCOUNTER — Emergency Department (HOSPITAL_COMMUNITY): Payer: Medicaid Other

## 2016-04-20 ENCOUNTER — Emergency Department (HOSPITAL_COMMUNITY)
Admission: EM | Admit: 2016-04-20 | Discharge: 2016-04-20 | Disposition: A | Discharge status: Home / Self Care | Payer: Medicaid Other | Attending: Emergency Medicine | Admitting: Emergency Medicine

## 2016-04-20 ENCOUNTER — Encounter (HOSPITAL_COMMUNITY): Payer: Self-pay | Admitting: Emergency Medicine

## 2016-04-20 DIAGNOSIS — I10 Essential (primary) hypertension: Secondary | ICD-10-CM | POA: Insufficient documentation

## 2016-04-20 DIAGNOSIS — Z87891 Personal history of nicotine dependence: Secondary | ICD-10-CM | POA: Diagnosis not present

## 2016-04-20 DIAGNOSIS — Z7982 Long term (current) use of aspirin: Secondary | ICD-10-CM | POA: Insufficient documentation

## 2016-04-20 DIAGNOSIS — M546 Pain in thoracic spine: Secondary | ICD-10-CM | POA: Insufficient documentation

## 2016-04-20 DIAGNOSIS — Z79899 Other long term (current) drug therapy: Secondary | ICD-10-CM | POA: Diagnosis not present

## 2016-04-20 DIAGNOSIS — E119 Type 2 diabetes mellitus without complications: Secondary | ICD-10-CM | POA: Insufficient documentation

## 2016-04-20 DIAGNOSIS — R079 Chest pain, unspecified: Secondary | ICD-10-CM

## 2016-04-20 LAB — I-STAT TROPONIN, ED: Troponin i, poc: 0.01 ng/mL (ref 0.00–0.08)

## 2016-04-20 LAB — BASIC METABOLIC PANEL
Anion gap: 9 (ref 5–15)
BUN: 10 mg/dL (ref 6–20)
CO2: 24 mmol/L (ref 22–32)
Calcium: 9.7 mg/dL (ref 8.9–10.3)
Chloride: 101 mmol/L (ref 101–111)
Creatinine, Ser: 0.63 mg/dL (ref 0.44–1.00)
GFR calc Af Amer: >60 mL/min (ref >60)
GFR calc non Af Amer: >60 mL/min (ref >60)
Glucose, Bld: 104 mg/dL — ABNORMAL HIGH (ref 65–99)
Potassium: 4 mmol/L (ref 3.5–5.1)
Sodium: 134 mmol/L — ABNORMAL LOW (ref 135–145)

## 2016-04-20 LAB — CBC
HCT: 40.6 % (ref 36.0–46.0)
Hemoglobin: 13.3 g/dL (ref 12.0–15.0)
MCH: 28.7 pg (ref 26.0–34.0)
MCHC: 32.8 g/dL (ref 30.0–36.0)
MCV: 87.5 fL (ref 78.0–100.0)
Platelets: 255 10*3/uL (ref 150–400)
RBC: 4.64 MIL/uL (ref 3.87–5.11)
RDW: 15 % (ref 11.5–15.5)
WBC: 8.4 10*3/uL (ref 4.0–10.5)

## 2016-04-20 NOTE — ED Notes (Signed)
Pt c/o pain in right upper back  radiating  Around to under right breast

## 2016-04-20 NOTE — ED Provider Notes (Signed)
[facility]EMER[facility]ENCY DEPT Provider Note   CSN: [id number] Arrival date & time: 04/20/16  1723  First Provider Contact:  First MD Initiated Contact with Patient 04/20/16 1806        History   Chief Complaint Chief Complaint  Patient presents with  . Back Pain  . Chest Pain    HPI Mariah [name] is a 55 y.o. female.  She presents for evaluation of pain in her right upper back, right arm and right chest, which started spontaneously today. She took some Percocet, and had improvement of the pain. She denies fever, chills, nausea, vomiting, cough or shortness of breath. No prior similar problem. History of both lumbar and cervical spine surgery. No recent problems with her spine. There is been no known trauma. There are no other known modifying factors.  HPI  Past Medical History:  Diagnosis Date  . Degenerative disc disease, lumbar   . Diabetes mellitus without complication (HCC)   . GERD (gastroesophageal reflux disease)   . Hypertension   . Mitral valve prolapse     Patient Active Problem List   Diagnosis Date Noted  . Diabetes mellitus (HCC) 11/02/2015  . Hoarse 09/21/2015  . Degenerative arthritis of spine 09/21/2015  . Abdominal pain 08/17/2015  . H/O arthrodesis 04/27/2015  . Cervical myelopathy (HCC) 12/22/2014  . Vaginal discharge 02/21/2014  . BPPV (benign paroxysmal positional vertigo) 10/06/2013  . 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 Leg edema, right 06/16/2013  . Plantar wart 02/22/2013  . Back pain 01/27/2013  . Degenerative disc disease, lumbar   . Mitral valve prolapse   . Neck pain 01/03/2011  . OBESITY 02/13/2009  . Adiposity 02/13/2009  . HYPERTENSION, BENIGN 08/07/2008  . Anxiety 02/26/2007  . Allergic rhinitis 02/26/2007  . GERD 02/26/2007    Past Surgical History:  Procedure Laterality Date  . ABDOMINAL HYSTERECTOMY    . ABDOMINAL HYSTERECTOMY    . BACK SURGERY    . CHOLECYSTECTOMY    . KNEE SURGERY      OB History    Gravida Para Term  Preterm AB Living   2 2           SAB TAB Ectopic Multiple Live Births                   Home Medications    Prior to Admission medications   Medication Sig Start Date End Date Taking? Authorizing Provider  ALPRAZolam Prudy Feeler) 1 MG tablet TAKE ONE TABLET BY MOUTH AT BEDTIME AS NEEDED FOR SLEEP OR ANXIETY 03/28/16   Campbell Stall, MD  amoxicillin-clavulanate (AUGMENTIN) 875-125 MG tablet Take 1 tablet by mouth every 12 (twelve) hours. 03/08/16   Anselm Pancoast, PA-C  aspirin EC 81 MG tablet Take 1 tablet (81 mg total) by mouth daily. 08/16/13   Hilarie Fredrickson, MD  atenolol (TENORMIN) 50 MG tablet TAKE ONE TABLET BY MOUTH  DAILY 03/28/16   Campbell Stall, MD  cetirizine (ZYRTEC) 10 MG tablet Take 10 mg by mouth daily as needed for allergies.     Historical Provider, MD  cyclobenzaprine (FLEXERIL) 5 MG tablet Take 1 tablet (5 mg total) by mouth 3 (three) times daily as needed for muscle spasms. 02/13/14   Amber Nydia Bouton, MD  esomeprazole (NEXIUM) 40 MG capsule Take 1 capsule (40 mg total) by mouth daily. Patient not taking: Reported on 12/03/2015 02/13/14   Hilarie Fredrickson, MD  fluticasone (FLONASE) 50 MCG/ACT nasal spray Place 2 sprays into both nostrils daily.  09/21/15   Palma HolterKanishka G Gunadasa, MD  fluticasone (FLONASE) 50 MCG/ACT nasal spray Place 2 sprays into both nostrils daily. 03/08/16   Shawn C Joy, PA-C  gabapentin (NEURONTIN) 300 MG capsule Take 1 capsule (300 mg total) by mouth 3 (three) times daily. Patient not taking: Reported on 01/04/2015 10/31/14   Marisa Severinlga Otter, MD  hydrochlorothiazide (HYDRODIURIL) 25 MG tablet TAKE ONE TABLET BY MOUTH  DAILY 03/28/16   Campbell StallKaty Dodd Mayo, MD  imiquimod [facility](ALDARA) 5 % cream Apply topically 3 (three) times a week. Patient not taking: Reported on 10/31/2014 08/16/13   Hilarie FredricksonAmber M Hairford, MD  loratadine (CLARITIN) 10 MG tablet Take 1 tablet (10 mg total) by mouth daily. 03/08/16   Shawn C Joy, PA-C  Multiple Vitamin (MULTIVITAMIN WITH MINERALS) TABS Take 1 tablet by mouth  daily. Reported on 11/02/2015    Historical Provider, MD    Family History No family history on file.  Social History Social History  Substance Use Topics  . Smoking status: Former Smoker    Quit date: 09/16/1995  . Smokeless tobacco: Never Used  . Alcohol use Yes     Allergies   Lisinopril; Moxifloxacin; and Sulfonamide derivatives   Review of Systems Review of Systems  All other systems reviewed and are negative.    Physical Exam Updated Vital Signs BP 143/66   Pulse 61   Temp 98.1 F (36.7 C)   Resp 20   Ht 5\' 6"  (1.676 m)   Wt 193 lb 1 oz (87.6 kg)   SpO2 100%   BMI 31.16 kg/m   Physical Exam  Constitutional: She is oriented to person, place, and time. She appears well-developed and well-nourished.  HENT:  Head: Normocephalic and atraumatic.  Eyes: Conjunctivae and EOM are normal. Pupils are equal, round, and reactive to light.  Neck: Normal range of motion and phonation normal. Neck supple.  Cardiovascular: Normal rate and regular rhythm.   Pulmonary/Chest: Effort normal and breath sounds normal. She exhibits no tenderness.  Abdominal: Soft. She exhibits no distension. There is no tenderness. There is no guarding.  Musculoskeletal: Normal range of motion.  Neurological: She is alert and oriented to person, place, and time. She exhibits normal muscle tone.  Skin: Skin is warm and dry.  Psychiatric: She has a normal mood and affect. Her behavior is normal. Judgment and thought content normal.  Nursing note and vitals reviewed.    ED Treatments / Results  Labs (all labs ordered are listed, but only abnormal results are displayed) Labs Reviewed  BASIC METABOLIC PANEL - Abnormal; Notable for the following:       Result Value   Sodium 134 (*)    Glucose, Bld 104 (*)    All other components within normal limits  CBC  I-STAT TROPOININ, ED    EKG  EKG Interpretation  Date/Time:  Sunday April 20 2016 17:33:23 EDT Ventricular Rate:  60 PR  Interval:  180 QRS Duration: 84 QT Interval:  434 QTC Calculation: 434 R Axis:   38 Text Interpretation:  Normal sinus rhythm Normal ECG since last tracing no significant change Confirmed by Effie ShyWENTZ  MD, Deyna Carbon [phone]([id number]) on 04/20/2016 6:05:58 PM       Radiology Dg Chest 2 View  Result Date: 04/20/2016 CLINICAL DATA:  Chest pain for 2 days, initial encounter EXAM: CHEST  2 VIEW COMPARISON:  10/17/2014 FINDINGS: The heart size and mediastinal contours are within normal limits. Both lungs are clear. The visualized skeletal structures are unremarkable. IMPRESSION: No active cardiopulmonary  disease. Electronically Signed   By: Alcide Clever M.D.   On: 04/20/2016 19:53    Procedures Procedures (including critical care time)  Medications Ordered in ED Medications - No data to display   Initial Impression / Assessment and Plan / ED Course  I have reviewed the triage vital signs and the nursing notes.  Pertinent labs & imaging results that were available during my care of the patient were reviewed by me and considered in my medical decision making (see chart for details).  Clinical Course  Value Comment By Time  ED EKG within 10 minutes (Reviewed) Mancel Bale, MD 08/06 1805    Medications - No data to display  Patient Vitals for the past 24 hrs:  BP Temp Pulse Resp SpO2 Height Weight  04/20/16 1728 143/66 98.1 F (36.7 C) 61 20 100 % 5\' 6"  (1.676 m) 193 lb 1 oz (87.6 kg)    8:02 PM Reevaluation with update and discussion. After initial assessment and treatment, an updated evaluation reveals No further complaints. Findings discussed with patient, all questions answered. Lerae Langham L    Final Clinical Impressions(s) / ED Diagnoses   Final diagnoses:  Right-sided thoracic back pain  Chest pain, unspecified chest pain type   Nonspecific pain, back and chest. Doubt ACS, PE or pneumonia.  Nursing Notes Reviewed/ Care Coordinated Applicable Imaging Reviewed Interpretation of  Laboratory Data incorporated into ED treatment  The patient appears reasonably screened and/or stabilized for discharge and I doubt any other medical condition or other [facility] requiring further screening, evaluation, or treatment in the ED at this time prior to discharge.  Plan: Home Medications- continue; Home Treatments- rest, heat; return here if the recommended treatment, does not improve the symptoms; Recommended follow up- PCP prn   New Prescriptions New Prescriptions   No medications on file     Mancel Bale, MD 04/20/16 2003

## 2016-04-20 NOTE — ED Triage Notes (Signed)
Pt c/o right upper back pain that radiates down right arm, up under right arm and into chest area onset today.

## 2016-04-20 NOTE — Discharge Instructions (Signed)
Use heat on the sore area 3 or 4 times a day.  Take your pain medicine as needed.  See your doctor if not better in 3 or 4 days

## 2016-04-20 NOTE — ED Notes (Signed)
Pt to xray at this time.

## 2016-04-28 ENCOUNTER — Ambulatory Visit (INDEPENDENT_AMBULATORY_CARE_PROVIDER_SITE_OTHER): Payer: Medicaid Other | Admitting: Internal Medicine

## 2016-04-28 DIAGNOSIS — L0293 Carbuncle, unspecified: Secondary | ICD-10-CM | POA: Diagnosis not present

## 2016-04-28 DIAGNOSIS — L84 Corns and callosities: Secondary | ICD-10-CM | POA: Diagnosis not present

## 2016-04-28 DIAGNOSIS — E119 Type 2 diabetes mellitus without complications: Secondary | ICD-10-CM

## 2016-04-28 LAB — POCT GLYCOSYLATED HEMOGLOBIN (HGB A1C): Hemoglobin A1C: 5.8

## 2016-04-28 MED ORDER — DOXYCYCLINE HYCLATE 100 MG PO TABS: 100.0000 mg | ORAL_TABLET | Freq: Two times a day (BID) | ORAL | 0 refills | Status: DC

## 2016-04-28 MED ORDER — ALPRAZOLAM 1 MG PO TABS: ORAL_TABLET | ORAL | 0 refills | Status: DC

## 2016-04-28 MED ORDER — IMIQUIMOD 5 % EX CREA: TOPICAL_CREAM | CUTANEOUS | 0 refills | Status: DC

## 2016-04-28 NOTE — Progress Notes (Signed)
[facility] Phone: [phone]  Subjective:  Pt presents to clinic today to meet new PCP and to discuss her diabetes, foot calluses, and boils in her genital area.  Diabetes Mellitus: Last A1c 6.6%. Previous PCP spoke with Pt about starting a medication for diabetes, but Pt preferred to work on her diet before being started on a medication. She has been grilling or baking all of her meats. She has decreased the amount of red meat that she eats. She has cut out bread and chips. She states there was only one day over these past few months where she ate a whole bag of Bugles. She has been drinking more water. She has also started eating more vegetables. She does not check her blood sugars at home. Denies polyuria, polydipsia, sweatiness, or shakiness.  Calluses on feet: Pt has had one large callus on each foot for the past couple of months. If she is on her feet for a while, the calluses become sore. She has been picking at the calluses to try to peel them off. She is wondering if she can still get pedicures. She denies any drainage from the calluses or spreading redness on the feet.  Genital boils: Pt has had recurrent boils in her genital area for the last 6 months. The boils are mainly located in the vaginal area and in the buttocks. She has had about 10 of them. They last a few days and then burst on their own. She thinks she has about 3 boils currently. She has just been using regular soap and warm water to cleanse the area while in the shower. She has not used any additional products in the area. She has not noticed boils anywhere else on her skin. She has never had this problem before. She denies any fevers, chills, nausea, vomiting.  ROS: See HPI for pertinent positives and negatives Past Medical History- HTN, DM, obesity, anxiety Reviewed problem list.  Medications- reviewed and updated Current Outpatient Prescriptions  Medication Sig Dispense Refill  .  ALPRAZolam (XANAX) 1 MG tablet TAKE ONE TABLET BY MOUTH AT BEDTIME AS NEEDED FOR SLEEP OR ANXIETY 30 tablet 0  . aspirin EC 81 MG tablet Take 1 tablet (81 mg total) by mouth daily.    Marland Kitchen. atenolol (TENORMIN) 50 MG tablet TAKE ONE TABLET BY MOUTH  DAILY 90 tablet 0  . cetirizine (ZYRTEC) 10 MG tablet Take 10 mg by mouth daily as needed for allergies.     . cyclobenzaprine (FLEXERIL) 5 MG tablet Take 1 tablet (5 mg total) by mouth 3 (three) times daily as needed for muscle spasms. 30 tablet 1  . doxycycline (VIBRA-TABS) 100 MG tablet Take 1 tablet (100 mg total) by mouth 2 (two) times daily. 56 tablet 0  . fluticasone (FLONASE) 50 MCG/ACT nasal spray Place 2 sprays into both nostrils daily. 16 g 11  . hydrochlorothiazide (HYDRODIURIL) 25 MG tablet TAKE ONE TABLET BY MOUTH  DAILY 90 tablet 0  . imiquimod (ALDARA) 5 % cream Apply topically 3 (three) times a week. 12 each 0  . Multiple Vitamin (MULTIVITAMIN WITH MINERALS) TABS Take 1 tablet by mouth daily. Reported on 11/02/2015     No current facility-administered medications for this visit.    Chief complaint-noted Family history reviewed for today's visit. No changes. Social history- patient is a former smoker, quit in 1997.  Objective: There were no vitals taken for this visit. Gen: NAD, alert, cooperative with exam, pleasant HEENT: NCAT, EOMI, MMM Neck:  FROM, supple CV: RRR, no murmur Resp: CTABL, no wheezes, normal work of breathing GU: External genitalia appears normal, no lesions or boils present. Msk: No edema, warm, normal tone, moves UE/LE spontaneously Diabetic foot: heel of left foot with 2cm x 2cm callus, right foot with 1cm x 3 cm oval callus present on the ball of the foot. 2+ DP and PT pulses bilaterally, normal sensation throughout feet bilaterally, no other lesions or ulcers noted Neuro: Alert and oriented, no gross deficits Skin: No rashes, no lesions Psych: Appropriate behavior  Assessment/Plan: Diabetes Mellitus, diet  controlled: Last A1c 6.6%. Pt not started on oral medication because she preferred to try changing her diet first. Repeat A1c today was 5.8%. - Further nutrition counseling provided today - Diabetic foot exam performed today and was normal except for two calluses - Repeat A1c in 6 months  Foot Calluses: 2cm x 2cm callus present on left heel, 1cm x 3cm oval callus present on ball of right foot. Pt requesting referral to Podiatry. - Advised Pt to stop picking off the calluses - Referral to Podiatry  Genital Boils: Pt has been having recurrent genital boils for the last 6 months. On exam, no boils or other lesions are present. - Advised Pt to use Chlorhexidine body wash daily on the skin, but educated Pt that this wash should not be used directly on the vaginal mucous membranes - Pt given prescription for Doxycyline x 28 days - Precepted with Dr. Sula SodaFletke  Katy Mayo, MD PGY-2

## 2016-04-28 NOTE — Patient Instructions (Signed)
It was so nice to meet you!  You are doing an amazing job with your diabetes! Keep up the great work!  I have sent in a referral to the foot doctor. You should hear from our office in the next 2-4 weeks to schedule this appointment.  For the vaginal boils, I would recommend using Chlorhexidine body wash once a day. Please be careful to not let this body wash get into the vagina. I have also prescribed Doxycycline. Please take 1 tablet twice a day for 28 days.  We will see you back in 6 months or earlier if needed.  -Dr. Nancy MarusMayo

## 2016-04-28 NOTE — Progress Notes (Signed)
poct

## 2016-04-29 DIAGNOSIS — L84 Corns and callosities: Secondary | ICD-10-CM | POA: Insufficient documentation

## 2016-04-29 DIAGNOSIS — L0293 Carbuncle, unspecified: Secondary | ICD-10-CM | POA: Insufficient documentation

## 2016-04-29 NOTE — Assessment & Plan Note (Signed)
Last A1c 6.6%. Pt not started on oral medication because she preferred to try changing her diet first. Repeat A1c today was 5.8%. - Further nutrition counseling provided today - Diabetic foot exam performed today and was normal except for two calluses - Repeat A1c in 6 months

## 2016-04-29 NOTE — Assessment & Plan Note (Signed)
Pt has been having recurrent genital boils for the last 6 months. On exam, no boils or other lesions are present. - Advised Pt to use Chlorhexidine body wash daily on the skin, but educated Pt that this wash should not be used directly on the vaginal mucous membranes - Pt given prescription for Doxycyline x 28 days - Precepted with Dr. Randolm IdolFletke

## 2016-04-29 NOTE — Assessment & Plan Note (Signed)
2cm x 2cm callus present on left heel, 1cm x 3cm oval callus present on ball of right foot. Pt requesting referral to Podiatry. - Advised Pt to stop picking off the calluses - Referral to Podiatry

## 2016-05-12 ENCOUNTER — Encounter: Payer: Self-pay | Admitting: Podiatry

## 2016-05-12 ENCOUNTER — Ambulatory Visit (INDEPENDENT_AMBULATORY_CARE_PROVIDER_SITE_OTHER): Payer: Medicaid Other | Admitting: Podiatry

## 2016-05-12 DIAGNOSIS — Q828 Other specified congenital malformations of skin: Secondary | ICD-10-CM

## 2016-05-12 DIAGNOSIS — Q669 Congenital deformity of feet, unspecified: Secondary | ICD-10-CM | POA: Diagnosis not present

## 2016-05-12 NOTE — Patient Instructions (Signed)
Diabetes and Foot Care Diabetes may cause you to have problems because of poor blood supply (circulation) to your feet and legs. This may cause the skin on your feet to become thinner, break easier, and heal more slowly. Your skin may become dry, and the skin may peel and crack. You may also have nerve damage in your legs and feet causing decreased feeling in them. You may not notice minor injuries to your feet that could lead to infections or more serious problems. Taking care of your feet is one of the most important things you can do for yourself.  HOME CARE INSTRUCTIONS  Wear shoes at all times, even in the house. Do not go barefoot. Bare feet are easily injured.  Check your feet daily for blisters, cuts, and redness. If you cannot see the bottom of your feet, use a mirror or ask someone for help.  Wash your feet with warm water (do not use hot water) and mild soap. Then pat your feet and the areas between your toes until they are completely dry. Do not soak your feet as this can dry your skin.  Apply a moisturizing lotion or petroleum jelly (that does not contain alcohol and is unscented) to the skin on your feet and to dry, brittle toenails. Do not apply lotion between your toes.  Trim your toenails straight across. Do not dig under them or around the cuticle. File the edges of your nails with an emery board or nail file.  Do not cut corns or calluses or try to remove them with medicine.  Wear clean socks or stockings every day. Make sure they are not too tight. Do not wear knee-high stockings since they may decrease blood flow to your legs.  Wear shoes that fit properly and have enough cushioning. To break in new shoes, wear them for just a few hours a day. This prevents you from injuring your feet. Always look in your shoes before you put them on to be sure there are no objects inside.  Do not cross your legs. This may decrease the blood flow to your feet.  If you find a minor scrape,  cut, or break in the skin on your feet, keep it and the skin around it clean and dry. These areas may be cleansed with mild soap and water. Do not cleanse the area with peroxide, alcohol, or iodine.  When you remove an adhesive bandage, be sure not to damage the skin around it.  If you have a wound, look at it several times a day to make sure it is healing.  Do not use heating pads or hot water bottles. They may burn your skin. If you have lost feeling in your feet or legs, you may not know it is happening until it is too late.  Make sure your health care provider performs a complete foot exam at least annually or more often if you have foot problems. Report any cuts, sores, or bruises to your health care provider immediately. SEEK MEDICAL CARE IF:   You have an injury that is not healing.  You have cuts or breaks in the skin.  You have an ingrown nail.  You notice redness on your legs or feet.  You feel burning or tingling in your legs or feet.  You have pain or cramps in your legs and feet.  Your legs or feet are numb.  Your feet always feel cold. SEEK IMMEDIATE MEDICAL CARE IF:   There is increasing redness,   swelling, or pain in or around a wound.  There is a red line that goes up your leg.  Pus is coming from a wound.  You develop a fever or as directed by your health care provider.  You notice a bad smell coming from an ulcer or wound.   This information is not intended to replace advice given to you by your health care provider. Make sure you discuss any questions you have with your health care provider.   Document Released: 08/29/2000 Document Revised: 05/04/2013 Document Reviewed: 02/08/2013 Elsevier Interactive Patient Education 2016 [employer]vier Inc[employer]  

## 2016-05-12 NOTE — Progress Notes (Signed)
   Subjective:    Patient ID: Mariah [name], female    DOB: 10-22-1960, 55 y.o.   MRN: [id number]  HPI  55 year old female presents the office today for painful calluses to both of her feet. She denies of any foreign objects and denies any swelling or redness or any drainage. She is diabetic and her last A1c was 5.8. Denies any claudication symptoms. No numbness or tingling. No other complaints at this time.    Review of Systems  All other systems reviewed and are negative.      Objective:   Physical Exam General: AAO x3, NAD  Dermatological: Hyperkeratotic lesions are present bilateral submetatarsal on left plantar heel. Upon reviewing there is no underlying ulceration, drainage or other signs of infection. No foreign body present. There is no other open lesions or pre-ulcer lesions identified at this time..  Vascular: Dorsalis Pedis artery and Posterior Tibial artery pedal pulses are 2/4 bilateral with immedate capillary fill time. Pedal hair growth present. No varicosities and no lower extremity edema present bilateral. There is no pain with calf compression, swelling, warmth, erythema.   Neruologic: Grossly intact via light touch bilateral. Vibratory intact via tuning fork bilateral. Protective threshold with Semmes Wienstein monofilament intact to all pedal sites bilateral.   Musculoskeletal: No gross boney pedal deformities bilateral. No pain, crepitus, or limitation noted with foot and ankle range of motion bilateral. Muscular strength 5/5 in all groups tested bilateral.  Gait: Unassisted, Nonantalgic.      Assessment & Plan:  Porokeratosis 3 -Treatment options discussed including all alternatives, risks, and complications -Etiology of symptoms were discussed -Lesions were debrided 3 without complications or bleeding. Left plantar heel lesion appear to be deeper because of this area was cleaned and a pad was placed 5 by salicylic acid and a bandage. Post procedure  instructions discussed. Monitor for infection. -Follow-up in 3 weeks or sooner if any problems arise. In the meantime, encouraged to call the office with any questions, concerns, change in symptoms.   Ovid CurdMatthew Wagoner, DPM

## 2016-07-17 ENCOUNTER — Other Ambulatory Visit: Payer: Self-pay | Admitting: Internal Medicine

## 2016-07-18 NOTE — Telephone Encounter (Signed)
Patient is aware that script is ready for pick up. [name],CMA  

## 2016-07-18 NOTE — Telephone Encounter (Signed)
Please let Mariah [name] know that I have refilled her medication and placed it up front for her to pick up. Thank you!

## 2016-07-21 NOTE — Telephone Encounter (Signed)
Patient came in to pick up script but her medicaid card still doesn't reflect her new last name so she is unable to fill script printed by provider.  After speaking with Dr. Lum BabeEniola who is precepting, I was advised to call this in for patient.  She is aware of this and also her name was changed back to her maiden name until her medicaid card is changed to keep from having other issues.  Kahleel Fadeley,CMA

## 2016-08-16 ENCOUNTER — Other Ambulatory Visit: Payer: Self-pay | Admitting: Internal Medicine

## 2016-08-27 ENCOUNTER — Telehealth: Payer: Self-pay | Admitting: Internal Medicine

## 2016-08-27 NOTE — Telephone Encounter (Signed)
Done. 12 visits for 12 months.

## 2016-08-27 NOTE — Telephone Encounter (Signed)
Pt needs [facility] extended [facility] issues [facility], Please advise. Thanks! ep

## 2016-09-06 ENCOUNTER — Other Ambulatory Visit: Payer: Self-pay | Admitting: Internal Medicine

## 2016-09-11 ENCOUNTER — Telehealth: Payer: Self-pay

## 2016-09-11 ENCOUNTER — Other Ambulatory Visit: Payer: Self-pay | Admitting: Internal Medicine

## 2016-09-11 DIAGNOSIS — M25511 Pain in right shoulder: Secondary | ICD-10-CM

## 2016-09-11 NOTE — Telephone Encounter (Signed)
Would like a referral to Dr. Pati GalloJames Kramer for her shoulder. [phone]. Would like a years worth of visits.  Please let pt know when referral has been put in. Pt informed it could be a couple of weeks before she hears from Dr. Blenda BridegroomKramer's office for an appt. Mariah SpillersSharon T Havah [name], CMA

## 2016-09-11 NOTE — Telephone Encounter (Signed)
Spoke with patient and she is experiencing right shoulder pain.  States that she has seen Dr. Farris HasKramer for this issue in the past and just needs a new referral to see him again for this concern.  Will forward to MD. Burnard HawthorneJazmin Shelsie Tijerino,CMA

## 2016-09-11 NOTE — Progress Notes (Signed)
Received a call that patient would like a referral back to Orthopedic Surgery for right shoulder pain. [facility] and would like a referral back to see him. Referral placed.    Mariah CarolKaty Adlai Sinning, MD

## 2016-09-11 NOTE — Telephone Encounter (Signed)
Spoke with patient and she is aware that she will be getting a call from them in the next week or so. [name],CMA

## 2016-09-11 NOTE — Telephone Encounter (Signed)
Please let Mariah [name] know that I have placed a referral to Dr. Farris HasKramer. Thank you!

## 2016-10-12 ENCOUNTER — Emergency Department (HOSPITAL_COMMUNITY)
Admission: EM | Admit: 2016-10-12 | Discharge: 2016-10-12 | Disposition: A | Discharge status: Home / Self Care | Payer: Medicaid Other | Attending: Emergency Medicine | Admitting: Emergency Medicine

## 2016-10-12 ENCOUNTER — Encounter (HOSPITAL_COMMUNITY): Payer: Self-pay | Admitting: *Deleted

## 2016-10-12 DIAGNOSIS — Z87891 Personal history of nicotine dependence: Secondary | ICD-10-CM | POA: Diagnosis not present

## 2016-10-12 DIAGNOSIS — E119 Type 2 diabetes mellitus without complications: Secondary | ICD-10-CM | POA: Insufficient documentation

## 2016-10-12 DIAGNOSIS — M7591 Shoulder lesion, unspecified, right shoulder: Secondary | ICD-10-CM | POA: Insufficient documentation

## 2016-10-12 DIAGNOSIS — M25511 Pain in right shoulder: Secondary | ICD-10-CM | POA: Diagnosis present

## 2016-10-12 DIAGNOSIS — M7581 Other shoulder lesions, right shoulder: Secondary | ICD-10-CM

## 2016-10-12 DIAGNOSIS — Z7982 Long term (current) use of aspirin: Secondary | ICD-10-CM | POA: Insufficient documentation

## 2016-10-12 DIAGNOSIS — M778 Other enthesopathies, not elsewhere classified: Secondary | ICD-10-CM

## 2016-10-12 DIAGNOSIS — I10 Essential (primary) hypertension: Secondary | ICD-10-CM | POA: Diagnosis not present

## 2016-10-12 LAB — COMPREHENSIVE METABOLIC PANEL
ALT: 15 U/L (ref 14–54)
AST: 17 U/L (ref 15–41)
Albumin: 4 g/dL (ref 3.5–5.0)
Alkaline Phosphatase: 58 U/L (ref 38–126)
Anion gap: 11 (ref 5–15)
BUN: 16 mg/dL (ref 6–20)
CO2: 26 mmol/L (ref 22–32)
Calcium: 9.1 mg/dL (ref 8.9–10.3)
Chloride: 101 mmol/L (ref 101–111)
Creatinine, Ser: 0.97 mg/dL (ref 0.44–1.00)
GFR calc Af Amer: >60 mL/min (ref >60)
GFR calc non Af Amer: >60 mL/min (ref >60)
Glucose, Bld: 119 mg/dL — ABNORMAL HIGH (ref 65–99)
Potassium: 3.8 mmol/L (ref 3.5–5.1)
Sodium: 138 mmol/L (ref 135–145)
Total Bilirubin: 0.4 mg/dL (ref 0.3–1.2)
Total Protein: 6.6 g/dL (ref 6.5–8.1)

## 2016-10-12 LAB — CBC WITH DIFFERENTIAL/PLATELET
Basophils Absolute: 0.1 10*3/uL (ref 0.0–0.1)
Basophils Relative: 1 %
Eosinophils Absolute: 0.3 10*3/uL (ref 0.0–0.7)
Eosinophils Relative: 3 %
HCT: 39.5 % (ref 36.0–46.0)
Hemoglobin: 12.7 g/dL (ref 12.0–15.0)
Lymphocytes Relative: 56 %
Lymphs Abs: 5.6 10*3/uL — ABNORMAL HIGH (ref 0.7–4.0)
MCH: 27.6 pg (ref 26.0–34.0)
MCHC: 32.2 g/dL (ref 30.0–36.0)
MCV: 85.9 fL (ref 78.0–100.0)
Monocytes Absolute: 0.7 10*3/uL (ref 0.1–1.0)
Monocytes Relative: 7 %
Neutro Abs: 3.3 10*3/uL (ref 1.7–7.7)
Neutrophils Relative %: 33 %
Platelets: 252 10*3/uL (ref 150–400)
RBC: 4.6 MIL/uL (ref 3.87–5.11)
RDW: 14.5 % (ref 11.5–15.5)
WBC: 10 10*3/uL (ref 4.0–10.5)

## 2016-10-12 LAB — I-STAT TROPONIN, ED: Troponin i, poc: 0 ng/mL (ref 0.00–0.08)

## 2016-10-12 MED ORDER — HYDROCODONE-ACETAMINOPHEN 5-325 MG PO TABS: 1.0000 | ORAL_TABLET | Freq: Four times a day (QID) | ORAL | 0 refills | Status: DC | PRN

## 2016-10-12 MED ORDER — HYDROMORPHONE HCL 2 MG/ML IJ SOLN
1.0000 mg | Freq: Once | INTRAMUSCULAR | Status: AC
  Administered 2016-10-12: 1 mg via INTRAMUSCULAR
  Filled 2016-10-12: qty 1

## 2016-10-12 MED ORDER — METHOCARBAMOL 500 MG PO TABS
500.0000 mg | ORAL_TABLET | Freq: Once | ORAL | Status: AC
  Administered 2016-10-12: 500 mg via ORAL
  Filled 2016-10-12: qty 1

## 2016-10-12 MED ORDER — ONDANSETRON 4 MG PO TBDP
4.0000 mg | ORAL_TABLET | Freq: Once | ORAL | Status: AC
  Administered 2016-10-12: 4 mg via ORAL
  Filled 2016-10-12: qty 1

## 2016-10-12 MED ORDER — NAPROXEN 250 MG PO TABS
500.0000 mg | ORAL_TABLET | Freq: Once | ORAL | Status: AC
  Administered 2016-10-12: 500 mg via ORAL
  Filled 2016-10-12: qty 2

## 2016-10-12 MED ORDER — NAPROXEN 500 MG PO TABS: 500.0000 mg | ORAL_TABLET | Freq: Two times a day (BID) | ORAL | 0 refills | Status: DC

## 2016-10-12 NOTE — ED Triage Notes (Signed)
Patient presents with c/o pain to the right shoulder.  Has had surgery in 2013 on that shoulder but this pain has traveled into her neck and down the arm

## 2016-10-12 NOTE — Discharge Instructions (Signed)
Please see Orthopedist for further evaluation of what appears rotator cuff tendinitis. Take the Naprosyn daily, ice the shoulder and take norco only when pain is unbearable.

## 2016-10-12 NOTE — ED Provider Notes (Signed)
[facility]-EMERGENCY DEPT Provider Note   CSN: [id number] Arrival date & time: 10/12/16  [id number] By signing my name below, I, Levon HedgerElizabeth Hall, attest that this documentation has been prepared under the direction and in the presence of Derwood KaplanAnkit Joyelle Siedlecki, MD . Electronically Signed: Levon HedgerElizabeth Hall, Scribe. 10/12/2016. 3:21 AM.   History   Chief Complaint Chief Complaint  Patient presents with  . Shoulder Pain    HPI Mariah [name] is a 56 y.o. female with a history of HTN and DM who presents to the Emergency Department complaining of progressively worsening right shoulder pain onset yesterday. Per pt, the pain was intermittent until last night and is now constant, throbbing, severe pain which is exacerbated by raising her arm. No alleviating factors noted. No treatments tried PTA. PSHx of Rotator cuff repair in 2013. Pt has no other complaints or symptoms at this time.   The history is provided by the patient. No language interpreter was used.   Past Medical History:  Diagnosis Date  . Degenerative disc disease, lumbar   . Diabetes mellitus without complication (HCC)   . GERD (gastroesophageal reflux disease)   . Hypertension   . Mitral valve prolapse     Patient Active Problem List   Diagnosis Date Noted  . Callus of foot 04/29/2016  . Recurrent boils 04/29/2016  . Diabetes mellitus (HCC) 11/02/2015  . Degenerative arthritis of spine 09/21/2015  . H/O arthrodesis 04/27/2015  . Cervical myelopathy (HCC) 12/22/2014  . BPPV (benign paroxysmal positional vertigo) 10/06/2013  . 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 Leg edema, right 06/16/2013  . Back pain 01/27/2013  . Degenerative disc disease, lumbar   . Mitral valve prolapse   . Neck pain 01/03/2011  . OBESITY 02/13/2009  . HYPERTENSION, BENIGN 08/07/2008  . Anxiety 02/26/2007  . Allergic rhinitis 02/26/2007  . GERD 02/26/2007    Past Surgical History:  Procedure Laterality Date  . ABDOMINAL HYSTERECTOMY    . ABDOMINAL HYSTERECTOMY      . BACK SURGERY    . CHOLECYSTECTOMY    . KNEE SURGERY      OB History    Gravida Para Term Preterm AB Living   2 2           SAB TAB Ectopic Multiple Live Births                   Home Medications    Prior to Admission medications   Medication Sig Start Date End Date Taking? Authorizing Provider  ALPRAZolam Prudy Feeler(XANAX) 1 MG tablet TAKE ONE TABLET BY MOUTH AT BEDTIME AS NEEDED FOR SLEEP FOR ANXIETY 07/18/16   Campbell StallKaty Dodd Mayo, MD  aspirin EC 81 MG tablet Take 1 tablet (81 mg total) by mouth daily. 08/16/13   Hilarie FredricksonAmber M Hairford, MD  atenolol (TENORMIN) 50 MG tablet TAKE ONE TABLET BY MOUTH  DAILY 03/28/16   Campbell StallKaty Dodd Mayo, MD  atenolol (TENORMIN) 50 MG tablet TAKE ONE TABLET BY MOUTH ONCE DAILY 09/10/16   Campbell StallKaty Dodd Mayo, MD  cetirizine (ZYRTEC) 10 MG tablet Take 10 mg by mouth daily as needed for allergies.     Historical Provider, MD  cyclobenzaprine (FLEXERIL) 5 MG tablet Take 1 tablet (5 mg total) by mouth 3 (three) times daily as needed for muscle spasms. 02/13/14   Amber Nydia BoutonM Hairford, MD  doxycycline (VIBRA-TABS) 100 MG tablet Take 1 tablet (100 mg total) by mouth 2 (two) times daily. 04/28/16   Campbell StallKaty Dodd Mayo, MD  fluticasone (FLONASE) 50 MCG/ACT nasal spray  Place 2 sprays into both nostrils daily. 09/21/15   Palma Holter, MD  hydrochlorothiazide (HYDRODIURIL) 25 MG tablet TAKE ONE TABLET BY MOUTH ONCE DAILY 08/18/16   Campbell Stall, MD  HYDROcodone-acetaminophen (NORCO/VICODIN) 5-325 MG tablet Take 1 tablet by mouth every 6 (six) hours as needed for severe pain. 10/12/16   Derwood Kaplan, MD  imiquimod (ALDARA) 5 % cream Apply topically 3 (three) times a week. 04/28/16   Campbell Stall, MD  Multiple Vitamin (MULTIVITAMIN WITH MINERALS) TABS Take 1 tablet by mouth daily. Reported on 11/02/2015    Historical Provider, MD  naproxen (NAPROSYN) 500 MG tablet Take 1 tablet (500 mg total) by mouth 2 (two) times daily. 10/12/16   Derwood Kaplan, MD    Family History No family history on  file.  Social History Social History  Substance Use Topics  . Smoking status: Former Smoker    Quit date: 09/16/1995  . Smokeless tobacco: Never Used  . Alcohol use Yes     Allergies   Lisinopril; Moxifloxacin; and Sulfonamide derivatives   Review of Systems Review of Systems 10 systems reviewed and all are negative for acute change except as noted in the HPI.  Physical Exam Updated Vital Signs BP 152/69 (BP Location: Right Arm)   Pulse 79   Temp 97.7 F (36.5 C) (Oral)   Resp 20   Ht 5\' 6"  (1.676 m)   Wt 192 lb (87.1 kg)   SpO2 98%   BMI 30.99 kg/m   Physical Exam  Constitutional: She is oriented to person, place, and time. She appears well-developed and well-nourished. No distress.  HENT:  Head: Normocephalic and atraumatic.  Eyes: Conjunctivae are normal.  Cardiovascular: Normal rate and intact distal pulses.   Pulmonary/Chest: Effort normal.  Abdominal: She exhibits no distension.  Neurological: She is alert and oriented to person, place, and time.  No numbness  Skin: Skin is warm and dry.  Psychiatric: She has a normal mood and affect.  Nursing note and vitals reviewed.  MUSCULOSKELETAL EXAM: Pt has tenderness with abduction, and with internal and external rotation of the RUE.  ED Treatments / Results  DIAGNOSTIC STUDIES:  Oxygen Saturation is 96% on RA, adequate by my interpretation.    COORDINATION OF CARE:  3:19 AM Pt to f/u with orthopedics. Discussed treatment plan which includes pain medication with pt at bedside and pt agreed to plan.   Labs (all labs ordered are listed, but only abnormal results are displayed) Labs Reviewed  CBC WITH DIFFERENTIAL/PLATELET - Abnormal; Notable for the following:       Result Value   Lymphs Abs 5.6 (*)    All other components within normal limits  COMPREHENSIVE METABOLIC PANEL - Abnormal; Notable for the following:    Glucose, Bld 119 (*)    All other components within normal limits  I-STAT TROPOININ, ED     EKG  EKG Interpretation  Date/Time:  Sunday October 12 2016 02:22:37 EST Ventricular Rate:  55 PR Interval:  174 QRS Duration: 84 QT Interval:  478 QTC Calculation: 457 R Axis:   59 Text Interpretation:  Sinus bradycardia Otherwise normal ECG No acute changes Confirmed by Rhunette Croft, MD, Janey Genta ([id number]) on 10/12/2016 3:37:52 AM       Radiology No results found.  Procedures Procedures (including critical care time)  Medications Ordered in ED Medications  HYDROmorphone (DILAUDID) injection 1 mg (1 mg Intramuscular Given 10/12/16 0406)  naproxen (NAPROSYN) tablet 500 mg (500 mg Oral Given 10/12/16 0358)  methocarbamol (  ROBAXIN) tablet 500 mg (500 mg Oral Given 10/12/16 0358)  ondansetron (ZOFRAN-ODT) disintegrating tablet 4 mg (4 mg Oral Given 10/12/16 0510)     Initial Impression / Assessment and Plan / ED Course  I have reviewed the triage vital signs and the nursing notes.  Pertinent labs & imaging results that were available during my care of the patient were reviewed by me and considered in my medical decision making (see chart for details).     Pt has shoulder pain and clinically is having shoulder tendinitis, possibly rotator cuff impingement. Pain meds helped. Ortho f/u advised.  Final Clinical Impressions(s) / ED Diagnoses   Final diagnoses:  Shoulder tendinitis, right    New Prescriptions Discharge Medication List as of 10/12/2016  4:22 AM    START taking these medications   Details  HYDROcodone-acetaminophen (NORCO/VICODIN) 5-325 MG tablet Take 1 tablet by mouth every 6 (six) hours as needed for severe pain., Starting Sun 10/12/2016, Print    naproxen (NAPROSYN) 500 MG tablet Take 1 tablet (500 mg total) by mouth 2 (two) times daily., Starting Sun 10/12/2016, Print       I personally performed the services described in this documentation, which was scribed in my presence. The recorded information has been reviewed and is accurate.    Derwood Kaplan,  MD 10/15/16 [phone]

## 2016-10-12 NOTE — ED Notes (Addendum)
Pt to desk to inform this RN that she just vomited.  MD notified.

## 2016-10-12 NOTE — ED Notes (Addendum)
Pt presents with R shoulder pain.  Started during the day yesterday (1/27).  No injury.  Had rotator cuff repair in 2013.  Full ROM. Pt reports weakness on that side.

## 2016-10-24 ENCOUNTER — Ambulatory Visit (INDEPENDENT_AMBULATORY_CARE_PROVIDER_SITE_OTHER): Payer: Medicaid Other | Admitting: Internal Medicine

## 2016-10-24 ENCOUNTER — Encounter: Payer: Self-pay | Admitting: Gastroenterology

## 2016-10-24 ENCOUNTER — Encounter: Payer: Self-pay | Admitting: Internal Medicine

## 2016-10-24 VITALS — BP 140/68 | HR 63 | Temp 97.8°F | Ht 66.0 in | Wt 191.0 lb

## 2016-10-24 DIAGNOSIS — I10 Essential (primary) hypertension: Secondary | ICD-10-CM | POA: Diagnosis not present

## 2016-10-24 DIAGNOSIS — F419 Anxiety disorder, unspecified: Secondary | ICD-10-CM | POA: Diagnosis not present

## 2016-10-24 DIAGNOSIS — R7303 Prediabetes: Secondary | ICD-10-CM

## 2016-10-24 DIAGNOSIS — Z1212 Encounter for screening for malignant neoplasm of rectum: Secondary | ICD-10-CM | POA: Diagnosis not present

## 2016-10-24 DIAGNOSIS — Z1211 Encounter for screening for malignant neoplasm of colon: Secondary | ICD-10-CM | POA: Diagnosis not present

## 2016-10-24 DIAGNOSIS — K219 Gastro-esophageal reflux disease without esophagitis: Secondary | ICD-10-CM | POA: Diagnosis not present

## 2016-10-24 DIAGNOSIS — Z0000 Encounter for general adult medical examination without abnormal findings: Secondary | ICD-10-CM | POA: Diagnosis not present

## 2016-10-24 DIAGNOSIS — E119 Type 2 diabetes mellitus without complications: Secondary | ICD-10-CM | POA: Diagnosis not present

## 2016-10-24 HISTORY — DX: Prediabetes: R73.03

## 2016-10-24 LAB — POCT GLYCOSYLATED HEMOGLOBIN (HGB A1C): Hemoglobin A1C: 5.8

## 2016-10-24 MED ORDER — ALPRAZOLAM 1 MG PO TABS: ORAL_TABLET | ORAL | 0 refills | Status: DC

## 2016-10-24 MED ORDER — ESOMEPRAZOLE MAGNESIUM 20 MG PO CPDR: 20.0000 mg | DELAYED_RELEASE_CAPSULE | Freq: Every day | ORAL | 0 refills | Status: DC

## 2016-10-24 NOTE — Assessment & Plan Note (Signed)
Pt having epigastric discomfort and symptoms of heartburn. Has been on Nexium in the past, which helped. - Prescribed Nexium 20mg  daily.

## 2016-10-24 NOTE — Progress Notes (Signed)
   [facility] Phone: [phone]  Subjective:  Mariah BeechamCynthia is a 56 year old female presenting to clinic for follow-up of her diabetes and hypertension.  T2DM: Diet controlled. Does not take any medications. Has lost 20 pounds in last year. Does not check her blood sugars at home. No sweatiness or shakiness. No polyuria or polydipsia.  HTN: Takes atenolol and hydrochlorothiazide at home. Does not check her blood pressures at home. No chest pain, no shortness of breath, no lower extremity edema.  Anxiety: States her anxiety has been under good control recently. Takes Xanax a few times a week to help with sleep. No SI, HI.  GERD: Pt notes epigastric abdominal pain and heartburn. Has been on Nexium in the past, which helped. No nausea, no vomiting. No recent weight loss.  ROS: See HPI for pertinent positives and negatives  Past Medical History- hypertension, obesity, type 2 diabetes, anxiety  Family history reviewed for today's visit. No changes.  Social history- patient is a former smoker. Quit in 1997.  Objective: BP 140/68 (BP Location: Right Arm, Patient Position: Sitting, Cuff Size: Normal)   Pulse 63   Temp 97.8 F (36.6 C) (Oral)   Ht 5\' 6"  (1.676 m)   Wt 191 lb (86.6 kg)   SpO2 99%   BMI 30.83 kg/m  Gen: NAD, alert, cooperative with exam HEENT: [place]/AT, EOMI Neck: Supple, FROM CV: RRR, no murmur Resp: CTABL, no wheezes, normal work of breathing GI: Mild epigastric discomfort. Abdomen otherwise non-tender. +BS, soft. Msk: No edema, warm and well-perfused Neuro: Awake, alert, normal gait Skin: No rashes or lesions on exposed skin Psych: Appropriate behavior, normal rate of speech.  Assessment/Plan: Pre-diabetes: Formerly had T2DM, but has lost 20lbs over the last year. Last A1c 5.8. Repeat A1c today was 5.8. - Continue diet and exercise - Urine microalbumin obtained today - Pt has had an annual diabetic eye exam in the last year. Will ask Tia  to obtain records. - Follow-up in 6 months  HTN: Well-controlled. BP 140/68 in clinic, but Pt did not take either of her blood pressure medications today. - Continue Atenolol 50mg  daily and HCTZ 25mg  daily - Advised Pt to take BP medications before her next appointment so that we can see what her blood pressures are when she takes her medications. - Follow-up in 6 months  Anxiety: Pt has been on Xanax for over 20 years. She is adamantly opposed to stopping Xanax at this time. She is using 30 pills over the course of 3-6 months, because she only takes it 1-2 times per week to help with sleep. - Will continue to discuss stopping this medication at future visits - If she is truly only using this medication for sleep, we could consider switching to Trazodone or another sleep medication - Refill provided for Xanax #30 - Follow-up in 6 months  GERD: Pt having epigastric discomfort and symptoms of heartburn. Has been on Nexium in the past, which helped. - Prescribed Nexium 20mg  daily.  Health Care Maintenance: - Pt has never had a colonoscopy- GI referral placed - Declines pneumococcal vaccine today   Willadean CarolKaty Mayo, MD PGY-2

## 2016-10-24 NOTE — Assessment & Plan Note (Signed)
-   Pt has never had a colonoscopy- GI referral placed - Declines pneumococcal vaccine today

## 2016-10-24 NOTE — Assessment & Plan Note (Signed)
>>  ASSESSMENT AND PLAN FOR HYPERTENSION, BENIGN WRITTEN ON 10/24/2016 12:28 PM BY MAYO, KATY DODD, MD  Well-controlled. BP 140/68 in clinic, but Pt did not take either of her blood pressure medications today. - Continue Atenolol  daily and HCTZ  daily - Advised Pt to take BP medications before her next appointment so that we can see what her blood pressures are when she takes her medications. - Follow-up in 6 months

## 2016-10-24 NOTE — Assessment & Plan Note (Signed)
Well-controlled. BP 140/68 in clinic, but Pt did not take either of her blood pressure medications today. - Continue Atenolol 50mg  daily and HCTZ 25mg  daily - Advised Pt to take BP medications before her next appointment so that we can see what her blood pressures are when she takes her medications. - Follow-up in 6 months

## 2016-10-24 NOTE — Patient Instructions (Addendum)
You are doing an amazing job!! I am so proud of you!  I have placed an order for a colonoscopy. You will hear from our office in 2 weeks to schedule that appointment.  We will see you back in 6 months!

## 2016-10-24 NOTE — Assessment & Plan Note (Signed)
Formerly had T2DM, but has lost 20lbs over the last year. Last A1c 5.8. Repeat A1c today was 5.8. - Continue diet and exercise - Urine microalbumin obtained today - Pt has had an annual diabetic eye exam in the last year. Will ask Tia to obtain records. - Follow-up in 6 months

## 2016-10-24 NOTE — Assessment & Plan Note (Signed)
Pt has been on Xanax for over 20 years. She is adamantly opposed to stopping Xanax at this time. She is using 30 pills over the course of 3-6 months, because she only takes it 1-2 times per week to help with sleep. - Will continue to discuss stopping this medication at future visits - If she is truly only using this medication for sleep, we could consider switching to Trazodone or another sleep medication - Refill provided for Xanax #30 - Follow-up in 6 months

## 2016-10-24 NOTE — Assessment & Plan Note (Signed)
>>  ASSESSMENT AND PLAN FOR PREDIABETES WRITTEN ON 10/24/2016 12:24 PM BY MAYO, KATY DODD, MD  Formerly had T2DM, but has lost 20lbs over the last year. Last A1c 5.8. Repeat A1c today was 5.8. - Continue diet and exercise - Urine microalbumin obtained today - Pt has had an annual diabetic eye exam in the last year. Will ask Tia to obtain records. - Follow-up in 6 months

## 2016-10-25 LAB — MICROALBUMIN / CREATININE URINE RATIO
Creatinine, Urine: 277 mg/dL (ref 20–320)
Microalb Creat Ratio: 12 mcg/mg creat (ref <30)
Microalb, Ur: 3.3 mg/dL

## 2016-11-13 ENCOUNTER — Telehealth: Payer: Self-pay | Admitting: Internal Medicine

## 2016-11-13 ENCOUNTER — Other Ambulatory Visit: Payer: Self-pay | Admitting: Internal Medicine

## 2016-11-13 DIAGNOSIS — H9209 Otalgia, unspecified ear: Secondary | ICD-10-CM

## 2016-11-13 NOTE — Telephone Encounter (Signed)
Referral placed per patient preference

## 2016-11-13 NOTE — Telephone Encounter (Signed)
Pt would like a referral to ENT, pt's ear is still stopped up. ep

## 2016-11-13 NOTE — Telephone Encounter (Signed)
Will forward to MD. [name],CMA  

## 2016-11-14 NOTE — Telephone Encounter (Signed)
Tried to reach patient but no VM available.  She will hear next from the ENT. [name],CMA

## 2016-11-19 DIAGNOSIS — H6121 Impacted cerumen, right ear: Secondary | ICD-10-CM | POA: Diagnosis not present

## 2016-11-19 DIAGNOSIS — J3089 Other allergic rhinitis: Secondary | ICD-10-CM | POA: Diagnosis not present

## 2016-11-21 ENCOUNTER — Emergency Department (HOSPITAL_COMMUNITY)
Admission: EM | Admit: 2016-11-21 | Discharge: 2016-11-21 | Disposition: A | Discharge status: Home / Self Care | Payer: Medicaid Other | Attending: Emergency Medicine | Admitting: Emergency Medicine

## 2016-11-21 ENCOUNTER — Encounter (HOSPITAL_COMMUNITY): Payer: Self-pay | Admitting: Emergency Medicine

## 2016-11-21 ENCOUNTER — Emergency Department (HOSPITAL_COMMUNITY): Payer: Medicaid Other

## 2016-11-21 DIAGNOSIS — Z7982 Long term (current) use of aspirin: Secondary | ICD-10-CM | POA: Insufficient documentation

## 2016-11-21 DIAGNOSIS — M25562 Pain in left knee: Secondary | ICD-10-CM | POA: Diagnosis present

## 2016-11-21 DIAGNOSIS — Z79899 Other long term (current) drug therapy: Secondary | ICD-10-CM | POA: Diagnosis not present

## 2016-11-21 DIAGNOSIS — M25462 Effusion, left knee: Secondary | ICD-10-CM

## 2016-11-21 DIAGNOSIS — E119 Type 2 diabetes mellitus without complications: Secondary | ICD-10-CM | POA: Insufficient documentation

## 2016-11-21 DIAGNOSIS — I10 Essential (primary) hypertension: Secondary | ICD-10-CM | POA: Diagnosis not present

## 2016-11-21 DIAGNOSIS — Z87891 Personal history of nicotine dependence: Secondary | ICD-10-CM | POA: Diagnosis not present

## 2016-11-21 LAB — URIC ACID: Uric Acid, Serum: 6 mg/dL (ref 2.3–6.6)

## 2016-11-21 MED ORDER — NAPROXEN 500 MG PO TABS: 500.0000 mg | ORAL_TABLET | Freq: Two times a day (BID) | ORAL | 0 refills | Status: DC

## 2016-11-21 NOTE — ED Notes (Signed)
Patient transported to X-ray 

## 2016-11-21 NOTE — ED Triage Notes (Signed)
Pt in from home with c/o L lateral knee pain starting yesterday. No obvious deformity noted, pt able to bear weight. Denies any recent trauma to area.

## 2016-11-21 NOTE — ED Notes (Signed)
Pt did not need anything at this time  

## 2016-11-21 NOTE — Discharge Instructions (Signed)
Wear the knee sleeve for comfort. Take the Naprosyn to help with pain and swelling. Recommended to ice and elevate her knee as well. Follow-up with your primary care doctor if not improving in the next few days. Return here for any new or worsening symptoms.

## 2016-11-21 NOTE — ED Provider Notes (Signed)
[facility] Provider Note   CSN: [id number] Arrival date & time: 11/21/16  4098     History   Chief Complaint Chief Complaint  Patient presents with  . Knee Pain    HPI Mariah [name] is a 56 y.o. female.  The history is provided by the patient and medical records.  Knee Pain       56 year old female with history of diabetes, GERD, hypertension, MVP, presenting to the ED for left knee pain. Patient reports this began yesterday while she was in the grocery store. States it started out as a mild ache, but she reported some swelling that progressed throughout the evening. States she is having some trouble bending her left knee, but is able to walk normally. States some mildly increased pain with weightbearing. She denies any recent injury, trauma, or falls. She has no history of arthritis. No history of gout. Does report she eats fish on a nearly daily basis, occasional red meat.  Patient has not tried any medications for her symptoms.  Past Medical History:  Diagnosis Date  . Degenerative disc disease, lumbar   . Diabetes mellitus without complication (HCC)   . GERD (gastroesophageal reflux disease)   . Hypertension   . Mitral valve prolapse     Patient Active Problem List   Diagnosis Date Noted  . Prediabetes 10/24/2016  . Health care maintenance 10/24/2016  . Recurrent boils 04/29/2016  . Cervical myelopathy (HCC) 12/22/2014  . BPPV (benign paroxysmal positional vertigo) 10/06/2013  . 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 Degenerative disc disease, lumbar   . Mitral valve prolapse   . OBESITY 02/13/2009  . HYPERTENSION, BENIGN 08/07/2008  . Anxiety 02/26/2007  . Allergic rhinitis 02/26/2007  . GERD 02/26/2007    Past Surgical History:  Procedure Laterality Date  . ABDOMINAL HYSTERECTOMY    . ABDOMINAL HYSTERECTOMY    . BACK SURGERY    . CHOLECYSTECTOMY    . KNEE SURGERY      OB History    Gravida Para Term Preterm AB Living   2 2           SAB TAB  Ectopic Multiple Live Births                   Home Medications    Prior to Admission medications   Medication Sig Start Date End Date Taking? Authorizing Provider  ALPRAZolam Prudy Feeler) 1 MG tablet TAKE ONE TABLET BY MOUTH AT BEDTIME AS NEEDED FOR SLEEP FOR ANXIETY 10/24/16   Campbell Stall, MD  aspirin EC 81 MG tablet Take 1 tablet (81 mg total) by mouth daily. 08/16/13   Hilarie Fredrickson, MD  atenolol (TENORMIN) 50 MG tablet TAKE ONE TABLET BY MOUTH  DAILY 03/28/16   Campbell Stall, MD  atenolol (TENORMIN) 50 MG tablet TAKE ONE TABLET BY MOUTH ONCE DAILY 09/10/16   Campbell Stall, MD  cyclobenzaprine (FLEXERIL) 5 MG tablet Take 1 tablet (5 mg total) by mouth 3 (three) times daily as needed for muscle spasms. 02/13/14   Amber Nydia Bouton, MD  esomeprazole (NEXIUM) 20 MG capsule Take 1 capsule (20 mg total) by mouth daily at 12 noon. 10/24/16   Campbell Stall, MD  fluticasone (FLONASE) 50 MCG/ACT nasal spray Place 2 sprays into both nostrils daily. 09/21/15   Palma Holter, MD  hydrochlorothiazide (HYDRODIURIL) 25 MG tablet TAKE ONE TABLET BY MOUTH ONCE DAILY 08/18/16   Campbell Stall, MD  HYDROcodone-acetaminophen (NORCO/VICODIN) 5-325 MG tablet Take  1 tablet by mouth every 6 (six) hours as needed for severe pain. 10/12/16   Derwood Kaplan, MD  imiquimod (ALDARA) 5 % cream Apply topically 3 (three) times a week. 04/28/16   Campbell Stall, MD  Multiple Vitamin (MULTIVITAMIN WITH MINERALS) TABS Take 1 tablet by mouth daily. Reported on 11/02/2015    Historical Provider, MD  naproxen (NAPROSYN) 500 MG tablet Take 1 tablet (500 mg total) by mouth 2 (two) times daily. 10/12/16   Derwood Kaplan, MD    Family History No family history on file.  Social History Social History  Substance Use Topics  . Smoking status: Former Smoker    Quit date: 09/16/1995  . Smokeless tobacco: Never Used  . Alcohol use Yes     Allergies   Lisinopril; Moxifloxacin; and Sulfonamide derivatives   Review of  Systems Review of Systems  Musculoskeletal: Positive for arthralgias.  All other systems reviewed and are negative.    Physical Exam Updated Vital Signs BP 149/75   Temp 97.6 F (36.4 C) (Oral)   Ht 5\' 6"  (1.676 m)   Wt 86.6 kg   BMI 30.83 kg/m   Physical Exam  Constitutional: She is oriented to person, place, and time. She appears well-developed and well-nourished.  HENT:  Head: Normocephalic and atraumatic.  Mouth/Throat: Oropharynx is clear and moist.  Eyes: Conjunctivae and EOM are normal. Pupils are equal, round, and reactive to light.  Neck: Normal range of motion.  Cardiovascular: Normal rate, regular rhythm and normal heart sounds.   Pulmonary/Chest: Effort normal and breath sounds normal. No respiratory distress. She has no wheezes.  Abdominal: Soft. Bowel sounds are normal.  Musculoskeletal: Normal range of motion.  Left knee with small effusion along lateral margin; mildly tender in this area; full flexion/extension maintained; no apparent bony deformities; no signs of trauma, no ligamentous laxity; DP pulse intact, ambulatory  Neurological: She is alert and oriented to person, place, and time.  Skin: Skin is warm and dry.  Psychiatric: She has a normal mood and affect.  Nursing note and vitals reviewed.    ED Treatments / Results  Labs (all labs ordered are listed, but only abnormal results are displayed) Labs Reviewed  URIC ACID    EKG  EKG Interpretation None       Radiology Dg Knee Complete 4 Views Left  Result Date: 11/21/2016 CLINICAL DATA:  Knee swelling for 2 days, no known injury, initial encounter EXAM: LEFT KNEE - COMPLETE 4+ VIEW COMPARISON:  None. FINDINGS: Mild tricompartmental degenerative changes noted. Small joint effusion is seen. No acute fracture or dislocation is noted. No other focal abnormality is seen. IMPRESSION: Small joint effusion. Mild degenerative change. No acute fracture is seen. Electronically Signed   By: Alcide Clever  M.D.   On: 11/21/2016 09:02    Procedures Procedures (including critical care time)  Medications Ordered in ED Medications - No data to display   Initial Impression / Assessment and Plan / ED Course  I have reviewed the triage vital signs and the nursing notes.  Pertinent labs & imaging results that were available during my care of the patient were reviewed by me and considered in my medical decision making (see chart for details).  56 year old female here with left knee pain. Denies any injury, trauma, or falls. She does have a small joint effusion on exam along the lateral joint line. There is no bony deformity. No significant ligamentous laxity. There is no overlying erythema. Clinically not concerning for septic joint.  No history of gout but does take HCTZ and has been eating large amounts of fish recently. Uric acid is normal. X-rays negative for acute bony findings. This may be secondary to osteoarthritis or other similar pathology. Will start on anti-inflammatories, given knee sleeve for comfort. Encouraged ice and elevate at home to help with pain and swelling as well. She will follow-up with her primary care doctor.  Discussed plan with patient, she acknowledged understanding and agreed with plan of care.  Return precautions given for new or worsening symptoms.  Final Clinical Impressions(s) / ED Diagnoses   Final diagnoses:  Acute pain of left knee  Effusion, left knee    New Prescriptions Discharge Medication List as of 11/21/2016  9:53 AM       Garlon HatchetLisa M Keyon Winnick, PA-C 11/21/16 1149    Cathren LaineKevin Steinl, MD 11/21/16 1624

## 2016-11-28 ENCOUNTER — Telehealth: Payer: Self-pay | Admitting: Internal Medicine

## 2016-11-28 ENCOUNTER — Other Ambulatory Visit: Payer: Self-pay | Admitting: Internal Medicine

## 2016-11-28 DIAGNOSIS — M25569 Pain in unspecified knee: Principal | ICD-10-CM

## 2016-11-28 DIAGNOSIS — G8929 Other chronic pain: Secondary | ICD-10-CM

## 2016-11-28 NOTE — Telephone Encounter (Signed)
Will forward to MD. [name],CMA  

## 2016-11-28 NOTE — Telephone Encounter (Signed)
Please let Ms. Mariah [name] know that I have placed a referral.

## 2016-11-28 NOTE — Telephone Encounter (Signed)
LM for patient letting her know that referral was placed and she could hear about an appointment as soon as next week.  Waylin Dorko,CMA

## 2016-11-28 NOTE — Telephone Encounter (Signed)
Pt would like a referral to Dr Pati Gall[facility]James Kramer [facility] because of left leg/knee pain and swelling. Dr Nancy Marusmayo had no appts until April 6.  Please advise

## 2016-12-01 ENCOUNTER — Encounter: Payer: Self-pay | Admitting: Gastroenterology

## 2016-12-01 DIAGNOSIS — M25562 Pain in left knee: Secondary | ICD-10-CM | POA: Diagnosis not present

## 2016-12-08 ENCOUNTER — Other Ambulatory Visit: Payer: Self-pay | Admitting: Internal Medicine

## 2016-12-25 ENCOUNTER — Other Ambulatory Visit: Payer: Self-pay | Admitting: Internal Medicine

## 2017-01-12 ENCOUNTER — Other Ambulatory Visit: Payer: Self-pay | Admitting: Internal Medicine

## 2017-01-12 NOTE — Telephone Encounter (Signed)
Please let Ms. Mariah [name] know that I have refilled her Xanax. She can pick this up at our front desk. Thank you!

## 2017-01-12 NOTE — Telephone Encounter (Signed)
Advised patient that prescription was ready for her to pick up in the front office per Dr. Nancy Marus.

## 2017-01-29 DIAGNOSIS — R200 Anesthesia of skin: Secondary | ICD-10-CM

## 2017-01-29 DIAGNOSIS — R202 Paresthesia of skin: Secondary | ICD-10-CM | POA: Insufficient documentation

## 2017-01-29 HISTORY — DX: Paresthesia of skin: R20.0

## 2017-03-09 ENCOUNTER — Ambulatory Visit: Payer: Medicaid Other | Admitting: Internal Medicine

## 2017-03-13 ENCOUNTER — Other Ambulatory Visit: Payer: Self-pay | Admitting: Internal Medicine

## 2017-03-27 ENCOUNTER — Other Ambulatory Visit: Payer: Self-pay | Admitting: Internal Medicine

## 2017-03-27 MED ORDER — ALPRAZOLAM 1 MG PO TABS: ORAL_TABLET | ORAL | 0 refills | Status: DC

## 2017-03-27 NOTE — Telephone Encounter (Signed)
Patient is aware. Mariah [name],CMA  

## 2017-03-27 NOTE — Telephone Encounter (Signed)
Prescription placed up front for patient to pick up. 

## 2017-03-27 NOTE — Telephone Encounter (Signed)
Pt calling to request refill of:  Name of Medication(s): Xanax Last date of OV:  10-24-16 Pharmacy:    Will route refill request to Clinic RN.  Discussed with patient policy to call pharmacy for future refills.  Also, discussed refills may take up to 48 hours to approve or deny.  Markus JarvisEmily C Pittman

## 2017-03-27 NOTE — Telephone Encounter (Signed)
2nd request. ep °

## 2017-03-30 ENCOUNTER — Other Ambulatory Visit: Payer: Self-pay | Admitting: Internal Medicine

## 2017-04-28 ENCOUNTER — Other Ambulatory Visit: Payer: Self-pay | Admitting: Internal Medicine

## 2017-04-30 ENCOUNTER — Other Ambulatory Visit: Payer: Self-pay | Admitting: Internal Medicine

## 2017-06-23 ENCOUNTER — Emergency Department: Payer: Medicaid Other

## 2017-06-23 ENCOUNTER — Observation Stay
Admission: EM | Admit: 2017-06-23 | Discharge: 2017-06-24 | Disposition: A | Discharge status: Home / Self Care | Payer: Medicaid Other | Attending: Specialist | Admitting: Specialist

## 2017-06-23 ENCOUNTER — Observation Stay (HOSPITAL_BASED_OUTPATIENT_CLINIC_OR_DEPARTMENT_OTHER)
Admit: 2017-06-23 | Discharge: 2017-06-23 | Disposition: A | Discharge status: Home / Self Care | Payer: Medicaid Other | Attending: Internal Medicine | Admitting: Internal Medicine

## 2017-06-23 ENCOUNTER — Observation Stay: Payer: Medicaid Other

## 2017-06-23 DIAGNOSIS — I10 Essential (primary) hypertension: Secondary | ICD-10-CM | POA: Insufficient documentation

## 2017-06-23 DIAGNOSIS — E119 Type 2 diabetes mellitus without complications: Secondary | ICD-10-CM | POA: Diagnosis not present

## 2017-06-23 DIAGNOSIS — Z833 Family history of diabetes mellitus: Secondary | ICD-10-CM | POA: Insufficient documentation

## 2017-06-23 DIAGNOSIS — J309 Allergic rhinitis, unspecified: Secondary | ICD-10-CM | POA: Diagnosis not present

## 2017-06-23 DIAGNOSIS — Z7982 Long term (current) use of aspirin: Secondary | ICD-10-CM | POA: Diagnosis not present

## 2017-06-23 DIAGNOSIS — Z794 Long term (current) use of insulin: Secondary | ICD-10-CM | POA: Insufficient documentation

## 2017-06-23 DIAGNOSIS — Z888 Allergy status to other drugs, medicaments and biological substances status: Secondary | ICD-10-CM | POA: Insufficient documentation

## 2017-06-23 DIAGNOSIS — H8110 Benign paroxysmal vertigo, unspecified ear: Secondary | ICD-10-CM | POA: Diagnosis not present

## 2017-06-23 DIAGNOSIS — Z8249 Family history of ischemic heart disease and other diseases of the circulatory system: Secondary | ICD-10-CM | POA: Insufficient documentation

## 2017-06-23 DIAGNOSIS — Z9049 Acquired absence of other specified parts of digestive tract: Secondary | ICD-10-CM | POA: Insufficient documentation

## 2017-06-23 DIAGNOSIS — G459 Transient cerebral ischemic attack, unspecified: Principal | ICD-10-CM | POA: Diagnosis present

## 2017-06-23 DIAGNOSIS — I081 Rheumatic disorders of both mitral and tricuspid valves: Secondary | ICD-10-CM | POA: Diagnosis not present

## 2017-06-23 DIAGNOSIS — E669 Obesity, unspecified: Secondary | ICD-10-CM | POA: Diagnosis not present

## 2017-06-23 DIAGNOSIS — Z8673 Personal history of transient ischemic attack (TIA), and cerebral infarction without residual deficits: Secondary | ICD-10-CM | POA: Insufficient documentation

## 2017-06-23 DIAGNOSIS — Z6829 Body mass index (BMI) 29.0-29.9, adult: Secondary | ICD-10-CM | POA: Diagnosis not present

## 2017-06-23 DIAGNOSIS — Z881 Allergy status to other antibiotic agents status: Secondary | ICD-10-CM | POA: Diagnosis not present

## 2017-06-23 DIAGNOSIS — F419 Anxiety disorder, unspecified: Secondary | ICD-10-CM | POA: Insufficient documentation

## 2017-06-23 DIAGNOSIS — G453 Amaurosis fugax: Secondary | ICD-10-CM | POA: Diagnosis not present

## 2017-06-23 DIAGNOSIS — Z87891 Personal history of nicotine dependence: Secondary | ICD-10-CM | POA: Insufficient documentation

## 2017-06-23 DIAGNOSIS — M5136 Other intervertebral disc degeneration, lumbar region: Secondary | ICD-10-CM | POA: Diagnosis not present

## 2017-06-23 DIAGNOSIS — K219 Gastro-esophageal reflux disease without esophagitis: Secondary | ICD-10-CM | POA: Insufficient documentation

## 2017-06-23 DIAGNOSIS — Z90710 Acquired absence of both cervix and uterus: Secondary | ICD-10-CM | POA: Insufficient documentation

## 2017-06-23 DIAGNOSIS — Z79899 Other long term (current) drug therapy: Secondary | ICD-10-CM | POA: Diagnosis not present

## 2017-06-23 DIAGNOSIS — E785 Hyperlipidemia, unspecified: Secondary | ICD-10-CM | POA: Diagnosis not present

## 2017-06-23 DIAGNOSIS — R569 Unspecified convulsions: Secondary | ICD-10-CM

## 2017-06-23 DIAGNOSIS — Z882 Allergy status to sulfonamides status: Secondary | ICD-10-CM | POA: Insufficient documentation

## 2017-06-23 HISTORY — DX: Unspecified convulsions: R56.9

## 2017-06-23 LAB — URINALYSIS, COMPLETE (UACMP) WITH MICROSCOPIC
Bacteria, UA: NONE SEEN
Bilirubin Urine: NEGATIVE
Glucose, UA: NEGATIVE mg/dL
Hgb urine dipstick: NEGATIVE
Ketones, ur: NEGATIVE mg/dL
Leukocytes, UA: NEGATIVE
Nitrite: NEGATIVE
Protein, ur: NEGATIVE mg/dL
RBC / HPF: NONE SEEN RBC/hpf (ref 0–5)
Specific Gravity, Urine: 1.013 (ref 1.005–1.030)
pH: 6 (ref 5.0–8.0)

## 2017-06-23 LAB — BASIC METABOLIC PANEL
Anion gap: 11 (ref 5–15)
BUN: 17 mg/dL (ref 6–20)
CO2: 26 mmol/L (ref 22–32)
Calcium: 9.7 mg/dL (ref 8.9–10.3)
Chloride: 99 mmol/L — ABNORMAL LOW (ref 101–111)
Creatinine, Ser: 0.67 mg/dL (ref 0.44–1.00)
GFR calc Af Amer: >60 mL/min (ref >60)
GFR calc non Af Amer: >60 mL/min (ref >60)
Glucose, Bld: 100 mg/dL — ABNORMAL HIGH (ref 65–99)
Potassium: 3.7 mmol/L (ref 3.5–5.1)
Sodium: 136 mmol/L (ref 135–145)

## 2017-06-23 LAB — CBC
HCT: 41.7 % (ref 35.0–47.0)
Hemoglobin: 14.1 g/dL (ref 12.0–16.0)
MCH: 28 pg (ref 26.0–34.0)
MCHC: 33.8 g/dL (ref 32.0–36.0)
MCV: 82.9 fL (ref 80.0–100.0)
Platelets: 260 10*3/uL (ref 150–440)
RBC: 5.03 MIL/uL (ref 3.80–5.20)
RDW: 15.2 % — ABNORMAL HIGH (ref 11.5–14.5)
WBC: 8.5 10*3/uL (ref 3.6–11.0)

## 2017-06-23 LAB — LIPID PANEL
Cholesterol: 253 mg/dL — ABNORMAL HIGH (ref 0–200)
HDL: 54 mg/dL (ref >40)
LDL Cholesterol: 163 mg/dL — ABNORMAL HIGH (ref 0–99)
Total CHOL/HDL Ratio: 4.7 RATIO
Triglycerides: 182 mg/dL — ABNORMAL HIGH (ref <150)
VLDL: 36 mg/dL (ref 0–40)

## 2017-06-23 LAB — GLUCOSE, CAPILLARY
Glucose-Capillary: 96 mg/dL (ref 65–99)
Glucose-Capillary: 121 mg/dL — ABNORMAL HIGH (ref 65–99)
Glucose-Capillary: 111 mg/dL — ABNORMAL HIGH (ref 65–99)

## 2017-06-23 LAB — HEMOGLOBIN A1C
Hgb A1c MFr Bld: 6.1 % — ABNORMAL HIGH (ref 4.8–5.6)
Mean Plasma Glucose: 128.37 mg/dL

## 2017-06-23 MED ORDER — ASPIRIN 300 MG RE SUPP: 300.0000 mg | Freq: Every day | RECTAL | Status: DC

## 2017-06-23 MED ORDER — FLUTICASONE PROPIONATE 50 MCG/ACT NA SUSP
2.0000 | Freq: Every day | NASAL | Status: DC | PRN
  Filled 2017-06-23: qty 16

## 2017-06-23 MED ORDER — ATORVASTATIN CALCIUM 20 MG PO TABS
20.0000 mg | ORAL_TABLET | Freq: Every day | ORAL | Status: DC
  Administered 2017-06-23: 19:00:00 20 mg via ORAL
  Filled 2017-06-23: qty 1

## 2017-06-23 MED ORDER — INSULIN ASPART 100 UNIT/ML ~~LOC~~ SOLN: 0.0000 [IU] | Freq: Three times a day (TID) | SUBCUTANEOUS | Status: DC

## 2017-06-23 MED ORDER — ACETAMINOPHEN 325 MG PO TABS: 650.0000 mg | ORAL_TABLET | ORAL | Status: DC | PRN

## 2017-06-23 MED ORDER — ACETAMINOPHEN 160 MG/5ML PO SOLN
650.0000 mg | ORAL | Status: DC | PRN
  Filled 2017-06-23: qty 20.3

## 2017-06-23 MED ORDER — ATENOLOL 25 MG PO TABS
50.0000 mg | ORAL_TABLET | Freq: Every day | ORAL | Status: DC
  Administered 2017-06-23 – 2017-06-24 (×2): 50 mg via ORAL
  Filled 2017-06-23 (×2): qty 2

## 2017-06-23 MED ORDER — TETRACAINE HCL 0.5 % OP SOLN
2.0000 [drp] | Freq: Once | OPHTHALMIC | Status: AC
  Administered 2017-06-23: 2 [drp] via OPHTHALMIC
  Filled 2017-06-23: qty 4

## 2017-06-23 MED ORDER — ASPIRIN 325 MG PO TABS
325.0000 mg | ORAL_TABLET | Freq: Every day | ORAL | Status: DC
  Administered 2017-06-24: 325 mg via ORAL
  Filled 2017-06-23 (×2): qty 1

## 2017-06-23 MED ORDER — ASPIRIN EC 81 MG PO TBEC: 81.0000 mg | DELAYED_RELEASE_TABLET | Freq: Every day | ORAL | Status: DC

## 2017-06-23 MED ORDER — ALPRAZOLAM 0.5 MG PO TABS
1.0000 mg | ORAL_TABLET | Freq: Every evening | ORAL | Status: DC | PRN
  Administered 2017-06-23: 22:00:00 1 mg via ORAL
  Filled 2017-06-23: qty 2

## 2017-06-23 MED ORDER — ENOXAPARIN SODIUM 40 MG/0.4ML ~~LOC~~ SOLN
40.0000 mg | SUBCUTANEOUS | Status: DC
  Filled 2017-06-23: qty 0.4

## 2017-06-23 MED ORDER — HYDROCHLOROTHIAZIDE 25 MG PO TABS
25.0000 mg | ORAL_TABLET | Freq: Every day | ORAL | Status: DC
  Administered 2017-06-23 – 2017-06-24 (×2): 25 mg via ORAL
  Filled 2017-06-23 (×2): qty 1

## 2017-06-23 MED ORDER — ASPIRIN 81 MG PO CHEW
324.0000 mg | CHEWABLE_TABLET | Freq: Once | ORAL | Status: AC
  Administered 2017-06-23: 324 mg via ORAL
  Filled 2017-06-23: qty 4

## 2017-06-23 MED ORDER — ACETAMINOPHEN 650 MG RE SUPP: 650.0000 mg | RECTAL | Status: DC | PRN

## 2017-06-23 MED ORDER — STROKE: EARLY STAGES OF RECOVERY BOOK
Freq: Once | Status: AC
  Administered 2017-06-23: 17:00:00

## 2017-06-23 NOTE — ED Triage Notes (Signed)
Pt reports for the past several days has had intermittent episodes of dizziness and this am she started having pain in her left eye.

## 2017-06-23 NOTE — ED Provider Notes (Signed)
[facility] Emergency Department Provider Note    First MD Initiated Contact with Patient 06/23/17 1253     (approximate)  I have reviewed the triage vital signs and the nursing notes.   HISTORY  Chief Complaint Eye Problem and Dizziness    HPI Mariah [name] is a 56 y.o. female with a history of mitral valve prolapse as well as degenerative disc disease and diabetes presents with chief complaint of 2-3 minutes of blurry vision and scotomas to the left eye with a feeling of weakness around her left eye. Symptoms spontaneously resolved. There is no associated numbness or tingling. States she has felt a little stressed out and lightheaded over the past 3 days. No chest pain. No neck pain. Symptoms resolved spontaneously. Denies any previous history of stroke. No history of heart attack. No history of glaucoma.  Past Medical History:  Diagnosis Date  . Degenerative disc disease, lumbar   . Diabetes mellitus without complication (HCC)   . GERD (gastroesophageal reflux disease)   . Hypertension   . Mitral valve prolapse    No family history on file. Past Surgical History:  Procedure Laterality Date  . ABDOMINAL HYSTERECTOMY    . ABDOMINAL HYSTERECTOMY    . BACK SURGERY    . CHOLECYSTECTOMY    . KNEE SURGERY     Patient Active Problem List   Diagnosis Date Noted  . Prediabetes 10/24/2016  . Health care maintenance 10/24/2016  . Recurrent boils 04/29/2016  . Cervical myelopathy (HCC) 12/22/2014  . BPPV (benign paroxysmal positional vertigo) 10/06/2013  . 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 Degenerative disc disease, lumbar   . Mitral valve prolapse   . OBESITY 02/13/2009  . HYPERTENSION, BENIGN 08/07/2008  . Anxiety 02/26/2007  . Allergic rhinitis 02/26/2007  . GERD 02/26/2007      Prior to Admission medications   Medication Sig Start Date End Date Taking? Authorizing Provider  ALPRAZolam Prudy Feeler) 1 MG tablet TAKE 1 TABLET BY MOUTH AT BEDTIME  AS NEEDED FOR SLEEP FOR ANXIETY 03/27/17   Mayo, Allyn Kenner, MD  aspirin EC 81 MG tablet Take 1 tablet (81 mg total) by mouth daily. 08/16/13   Hairford, Ricki Miller, MD  atenolol (TENORMIN) 50 MG tablet TAKE ONE TABLET BY MOUTH  DAILY 03/28/16   Mayo, Allyn Kenner, MD  atenolol (TENORMIN) 50 MG tablet TAKE ONE TABLET BY MOUTH ONCE DAILY 04/28/17   Mayo, Allyn Kenner, MD  atenolol (TENORMIN) 50 MG tablet TAKE 1 TABLET BY MOUTH ONCE DAILY 04/30/17   Mayo, Allyn Kenner, MD  cyclobenzaprine (FLEXERIL) 5 MG tablet Take 1 tablet (5 mg total) by mouth 3 (three) times daily as needed for muscle spasms. 02/13/14   Hairford, Ricki Miller, MD  esomeprazole (NEXIUM) 20 MG capsule Take 1 capsule (20 mg total) by mouth daily at 12 noon. 10/24/16   Mayo, Allyn Kenner, MD  fluticasone (FLONASE) 50 MCG/ACT nasal spray Place 2 sprays into both nostrils daily. 09/21/15   Palma Holter, MD  hydrochlorothiazide (HYDRODIURIL) 25 MG tablet TAKE 1 TABLET BY MOUTH ONCE DAILY 04/28/17   Mayo, Allyn Kenner, MD  hydrochlorothiazide (HYDRODIURIL) 25 MG tablet TAKE ONE TABLET BY MOUTH ONCE DAILY 04/30/17   Mayo, Allyn Kenner, MD  HYDROcodone-acetaminophen (NORCO/VICODIN) 5-325 MG tablet Take 1 tablet by mouth every 6 (six) hours as needed for severe pain. 10/12/16   Derwood Kaplan, MD  imiquimod (ALDARA) 5 % cream Apply topically 3 (three) times a week. 04/28/16   Mayo, Allyn Kenner,  MD  Multiple Vitamin (MULTIVITAMIN WITH MINERALS) TABS Take 1 tablet by mouth daily. Reported on 11/02/2015    [provider]  naproxen (NAPROSYN) 500 MG tablet Take 1 tablet (500 mg total) by mouth 2 (two) times daily with a meal. 11/21/16   Garlon Hatchet, PA-C    Allergies Lisinopril; Moxifloxacin; and Sulfonamide derivatives    Social History Social History  Substance Use Topics  . Smoking status: Former Smoker    Quit date: 09/16/1995  . Smokeless tobacco: Never Used  . Alcohol use Yes    Review of Systems Patient denies headaches, rhinorrhea, blurry vision,  numbness, shortness of breath, chest pain, edema, cough, abdominal pain, nausea, vomiting, diarrhea, dysuria, fevers, rashes or hallucinations unless otherwise stated above in HPI. ____________________________________________   PHYSICAL EXAM:  VITAL SIGNS: Vitals:   06/23/17 0947  BP: (!) 152/73  Pulse: (!) 55  Resp: 20  Temp: 98.2 F (36.8 C)  SpO2: 98%    Constitutional: Alert and oriented. Well appearing and in no acute distress. Eyes: Conjunctivae are normal.  No proptosis, EOMI, no visual field cuts.  No evidence of retinal detachment via [place], PERLLA, tonometry 20 on left eye. Head: Atraumatic. Nose: No congestion/rhinnorhea. Mouth/Throat: Mucous membranes are moist.   Neck: No stridor. Painless ROM.  Cardiovascular: Normal rate, regular rhythm. Grossly normal heart sounds.  Good peripheral circulation. Respiratory: Normal respiratory effort.  No retractions. Lungs CTAB. Gastrointestinal: Soft and nontender. No distention. No abdominal bruits. No CVA tenderness. Musculoskeletal: No lower extremity tenderness nor edema.  No joint effusions. Neurologic:  CN- intact.  No facial droop, Normal FNF.  Normal heel to shin.  Sensation intact bilaterally. Normal speech and language. No gross focal neurologic deficits are appreciated. No gait instability.  Skin:  Skin is warm, dry and intact. No rash noted. Psychiatric: Mood and affect are normal. Speech and behavior are normal.  ____________________________________________   LABS (all labs ordered are listed, but only abnormal results are displayed)  Results for orders placed or performed during the hospital encounter of 06/23/17 (from the past 24 hour(s))  Basic metabolic panel     Status: Abnormal   Collection Time: 06/23/17 10:05 AM  Result Value Ref Range   Sodium 136 135 - 145 mmol/L   Potassium 3.7 3.5 - 5.1 mmol/L   Chloride 99 (L) 101 - 111 mmol/L   CO2 26 22 - 32 mmol/L   Glucose, Bld 100 (H) 65 - 99 mg/dL   BUN 17 6  - 20 mg/dL   Creatinine, Ser 1.61 0.44 - 1.00 mg/dL   Calcium 9.7 8.9 - 09.6 mg/dL   GFR calc non Af Amer >60 >60 mL/min   GFR calc Af Amer >60 >60 mL/min   Anion gap 11 5 - 15  CBC     Status: Abnormal   Collection Time: 06/23/17 10:05 AM  Result Value Ref Range   WBC 8.5 3.6 - 11.0 K/uL   RBC 5.03 3.80 - 5.20 MIL/uL   Hemoglobin 14.1 12.0 - 16.0 g/dL   HCT 04.5 40.9 - 81.1 %   MCV 82.9 80.0 - 100.0 fL   MCH 28.0 26.0 - 34.0 pg   MCHC 33.8 32.0 - 36.0 g/dL   RDW 91.4 (H) 78.2 - 95.6 %   Platelets 260 150 - 440 K/uL  Urinalysis, Complete w Microscopic     Status: Abnormal   Collection Time: 06/23/17 10:05 AM  Result Value Ref Range   Color, Urine YELLOW (A) YELLOW  APPearance CLEAR (A) CLEAR   Specific Gravity, Urine 1.013 1.005 - 1.030   pH 6.0 5.0 - 8.0   Glucose, UA NEGATIVE NEGATIVE mg/dL   Hgb urine dipstick NEGATIVE NEGATIVE   Bilirubin Urine NEGATIVE NEGATIVE   Ketones, ur NEGATIVE NEGATIVE mg/dL   Protein, ur NEGATIVE NEGATIVE mg/dL   Nitrite NEGATIVE NEGATIVE   Leukocytes, UA NEGATIVE NEGATIVE   RBC / HPF NONE SEEN 0 - 5 RBC/hpf   WBC, UA 0-5 0 - 5 WBC/hpf   Bacteria, UA NONE SEEN NONE SEEN   Squamous Epithelial / LPF 0-5 (A) NONE SEEN   Mucus PRESENT   Glucose, capillary     Status: None   Collection Time: 06/23/17 10:17 AM  Result Value Ref Range   Glucose-Capillary 96 65 - 99 mg/dL   ____________________________________________  EKG My review and personal interpretation at Time: 9:57   Indication: visual change  Rate: 50  Rhythm: sinus Axis: normal Other: no stemi, normal intervals, no depressions ____________________________________________  RADIOLOGY  I personally reviewed all radiographic images ordered to evaluate for the above acute complaints and reviewed radiology reports and findings.  These findings were personally discussed with the patient.  Please see medical record for radiology  report.  ____________________________________________   PROCEDURES  Procedure(s) performed:  Procedures    Critical Care performed: no ____________________________________________   INITIAL IMPRESSION / ASSESSMENT AND PLAN / ED COURSE  Pertinent labs & imaging results that were available during my care of the patient were reviewed by me and considered in my medical decision making (see chart for details).  DDX: tia, glaucoma, retinal detachment, retrobulbar mass, optic neuritis, migraine  LAVAUGHN BISIG is a 56 y.o. who presents to the ED with visual disturbance as described above. She currently well appearing symptoms have completely resolved. Not completely consistent with TIA or amaurosis fugax based on duration and only mild visual field cut described and scotomas. Not consistent with acute glaucoma. There is no proptosis or evidence of infectious process. A shocking motions are intact. But that ultrasound shows no evidence of retinal detachment or vitreous hemorrhage. CT imaging  shows evidence of chronic microvascular changes. Will touch base with neurology as I'm concerned for TIA.Marland Kitchen  Clinical Course as of Jun 24 1423  Tue Jun 23, 2017  1356 ABCD2 score of 2.    [PR]  1404 When spoke with patient regarding her presentation and my concern for amaurosis fugax versus TIA equivalent. CT head shows some microvascular ischemic changes. I have spoken with the patient and although she is requesting discharge home in [place] have strongly encouraged her to be admitted to the hospital after discussing with Dr. Thad Ranger with neurology. Patient will talk to her husband and let me know she agrees to stay.  [PR]  1423 Patient has agreed to stay for admission for further workup of the above symptoms and concern for TIA. Patient was given an oral aspirin after swallow screen. Spoke with hospitalist group, agrees to admit patient for further evaluation and management.  [PR]    Clinical  Course User Index [PR] Willy Eddy, MD     ____________________________________________   FINAL CLINICAL IMPRESSION(S) / ED DIAGNOSES  Final diagnoses:  TIA (transient ischemic attack)      NEW MEDICATIONS STARTED DURING THIS VISIT:  New Prescriptions   No medications on file     Note:  This document was prepared using Dragon voice recognition software and may include unintentional dictation errors.    Willy Eddy, MD 06/23/17  1424  

## 2017-06-23 NOTE — H&P (Signed)
[facility] - [place] at [facility]   PATIENT NAME: Mariah [name]    MR#:  [id number]  DATE OF BIRTH:  October 23, 1960  DATE OF ADMISSION:  06/23/2017  PRIMARY CARE PHYSICIAN: Mayo, Allyn Kenner, MD   REQUESTING/REFERRING PHYSICIAN: Dr. Roxan Hockey  CHIEF COMPLAINT:  Left eye transient loss of vision--- lateral Field  HISTORY OF PRESENT ILLNESS:  Mariah [name]  is a 56 y.o. female with a known history of hypertension, diabetes, GERD comes to the emergency room with complaints of transient loss of vision on the left lateral field this morning. Patient said it happened only once. Denies any dysarthria or dysphagia or focal weakness. Patient symptoms have completely resolved. She has been admitted after ER physician and reviewed case with neurology Dr. Thad Ranger for TIA observation.  PAST MEDICAL HISTORY:   Past Medical History:  Diagnosis Date  . Degenerative disc disease, lumbar   . Diabetes mellitus without complication (HCC)   . GERD (gastroesophageal reflux disease)   . Hypertension   . Mitral valve prolapse     PAST SURGICAL HISTOIRY:   Past Surgical History:  Procedure Laterality Date  . ABDOMINAL HYSTERECTOMY    . ABDOMINAL HYSTERECTOMY    . BACK SURGERY    . CHOLECYSTECTOMY    . KNEE SURGERY      SOCIAL HISTORY:   Social History  Substance Use Topics  . Smoking status: Former Smoker    Quit date: 09/16/1995  . Smokeless tobacco: Never Used  . Alcohol use Yes    FAMILY HISTORY:  No family history on file.  DRUG ALLERGIES:   Allergies  Allergen Reactions  . Lisinopril     REACTION: COUGH  . Moxifloxacin     REACTION: N \\T \ V  . Sulfonamide Derivatives Other (See Comments)    Pt cannot remember rxn    REVIEW OF SYSTEMS:  Review of Systems  Constitutional: Negative for chills, fever and weight loss.  HENT: Negative for ear discharge, ear pain and nosebleeds.        Transient loss of vision left eye lateral Field of vision  Eyes:  Negative for blurred vision, pain and discharge.  Respiratory: Negative for sputum production, shortness of breath, wheezing and stridor.   Cardiovascular: Negative for chest pain, palpitations, orthopnea and PND.  Gastrointestinal: Negative for abdominal pain, diarrhea, nausea and vomiting.  Genitourinary: Negative for frequency and urgency.  Musculoskeletal: Negative for back pain and joint pain.  Neurological: Positive for weakness. Negative for sensory change, speech change and focal weakness.  Psychiatric/Behavioral: Negative for depression and hallucinations. The patient is not nervous/anxious.      MEDICATIONS AT HOME:   Prior to Admission medications   Medication Sig Start Date End Date Taking? Authorizing Provider  aspirin EC 81 MG tablet Take 1 tablet (81 mg total) by mouth daily. 08/16/13  Yes Hairford, Ricki Miller, MD  atenolol (TENORMIN) 50 MG tablet TAKE ONE TABLET BY MOUTH  DAILY 03/28/16  Yes Mayo, Allyn Kenner, MD  atenolol (TENORMIN) 50 MG tablet TAKE 1 TABLET BY MOUTH ONCE DAILY 04/30/17  Yes Mayo, Allyn Kenner, MD  hydrochlorothiazide (HYDRODIURIL) 25 MG tablet TAKE 1 TABLET BY MOUTH ONCE DAILY 04/28/17  Yes Mayo, Allyn Kenner, MD  ALPRAZolam Prudy Feeler) 1 MG tablet TAKE 1 TABLET BY MOUTH AT BEDTIME AS NEEDED FOR SLEEP FOR ANXIETY 03/27/17   Mayo, Allyn Kenner, MD  fluticasone [facility]) 50 MCG/ACT nasal spray Place 2 sprays into both nostrils daily. 09/21/15   Palma Holter, MD  oxyCODONE-acetaminophen (PERCOCET) 10-325 MG tablet Take 1 tablet by mouth every 6 (six) hours as needed. 04/02/17   [provider]      VITAL SIGNS:  Blood pressure (!) 152/73, pulse (!) 55, temperature 98.2 F (36.8 C), temperature source Oral, resp. rate 20, height  (1.676 m), weight 89.8 kg (198 lb), SpO2 98 %.  PHYSICAL EXAMINATION:  GENERAL:  56 y.o.-year-old patient lying in the bed with no acute distress.  EYES: Pupils equal, round, reactive to light and accommodation. No scleral icterus.  Extraocular muscles intact.  HEENT: Head atraumatic, normocephalic. Oropharynx and nasopharynx clear.  NECK:  Supple, no jugular venous distention. No thyroid enlargement, no tenderness.  LUNGS: Normal breath sounds bilaterally, no wheezing, rales,rhonchi or crepitation. No use of accessory muscles of respiration.  CARDIOVASCULAR: S1, S2 normal. No murmurs, rubs, or gallops.  ABDOMEN: Soft, nontender, nondistended. Bowel sounds present. No organomegaly or mass.  EXTREMITIES: No pedal edema, cyanosis, or clubbing.  NEUROLOGIC: Cranial nerves II through XII are intact. Muscle strength 5/5 in all extremities. Sensation intact. Gait not checked.  PSYCHIATRIC: The patient is alert and oriented x 3.  SKIN: No obvious rash, lesion, or ulcer.   LABORATORY PANEL:   CBC  Recent Labs Lab 06/23/17 1005  WBC 8.5  HGB 14.1  HCT 41.7  PLT 260   ------------------------------------------------------------------------------------------------------------------  Chemistries   Recent Labs Lab 06/23/17 1005  NA 136  K 3.7  CL 99*  CO2 26  GLUCOSE 100*  BUN 17  CREATININE 0.67  CALCIUM 9.7   ------------------------------------------------------------------------------------------------------------------  Cardiac Enzymes No results for input(s): TROPONINI in the last 168 hours. ------------------------------------------------------------------------------------------------------------------  RADIOLOGY:  Ct Head Wo Contrast  Result Date: 06/23/2017 CLINICAL DATA:  Pt reports for the past several days has had intermittent episodes of dizziness and this am she started having a black spot in the vision of her left eye. EXAM: CT HEAD WITHOUT CONTRAST TECHNIQUE: Contiguous axial images were obtained from the base of the skull through the vertex without intravenous contrast. COMPARISON:  None. FINDINGS: Brain: No evidence of acute infarction, hemorrhage, extra-axial collection, ventriculomegaly,  or mass effect. Generalized cerebral atrophy. Periventricular white matter low attenuation likely secondary to microangiopathy. Vascular: Cerebrovascular atherosclerotic calcifications are noted. Skull: Negative for fracture or focal lesion. Sinuses/Orbits: Visualized portions of the orbits are unremarkable. Visualized portions of the paranasal sinuses and mastoid air cells are unremarkable. Other: None. IMPRESSION: 1. No acute intracranial pathology. 2. Chronic microvascular disease and cerebral atrophy. Electronically Signed   By: Elige Ko   On: 06/23/2017 13:48    EKG:   Sinus bradycardia IMPRESSION AND PLAN:   Mariah [name]  is a 56 y.o. female with a known history of hypertension, diabetes, GERD comes to the emergency room with complaints of transient loss of vision on the left lateral field this morning. Patient said it happened only once.  1. TIA -Patient presented with left lateral field of vision loss for 5 minutes--- resolved now -no history of CVA in the past -Aspirin, statins -Patient has risk factor with hypertension diabetes -MRI/MRA of the brain -Neurology consultation -Consider adding Plavix if neurology feels it appropriate  2. Type 2 diabetes -Home meds and sliding scale insulin  3 hypertension Continue atenolol  4. Smoking cessation advice about 4 minutes spent. Patient agreeable  All the records are reviewed and case discussed with ED provider. Management plans discussed with the patient, family and they are in agreement.  CODE STATUS: Full   TOTAL TIME TAKING CARE  OF THIS PATIENT: 50 minutes.    Mariah [name] M.D on 06/23/2017 at 2:37 PM  Between 7am to 6pm - Pager - [phone]  After 6pm go to [email or web address] - password [email or web address] [facility]  [facility]  Office  [phone]  CC: Primary care physician; Mayo, Allyn Kenner, MD

## 2017-06-24 DIAGNOSIS — G459 Transient cerebral ischemic attack, unspecified: Secondary | ICD-10-CM | POA: Diagnosis not present

## 2017-06-24 LAB — GLUCOSE, CAPILLARY: Glucose-Capillary: 115 mg/dL — ABNORMAL HIGH (ref 65–99)

## 2017-06-24 LAB — ECHOCARDIOGRAM COMPLETE
Height: 66 in
Weight: 2964.8 oz

## 2017-06-24 MED ORDER — ATORVASTATIN CALCIUM 20 MG PO TABS: 20.0000 mg | ORAL_TABLET | Freq: Every day | ORAL | 1 refills | Status: DC

## 2017-06-24 NOTE — Consult Note (Signed)
Referring Physician: Cherlynn Kaiser    Chief Complaint: Visual field loss  HPI: Mariah [name] is an 56 y.o. female with a history of TIA on ASA who reports that yesterday AM had the onset of loss of vision in her left visual field.  Did not cover each eye to determine if it was monocular or binocular.  Symptoms lasted a few minutes and resolved.  Patient had a similar event about 10 years ago and was placed on ASA at that time.   Initial NIHSS of 0.    Date last known well: Date: 06/23/2017 Time last known well: Time: 09:00 tPA Given: No: Resolution of symptoms  Past Medical History:  Diagnosis Date  . Degenerative disc disease, lumbar   . Diabetes mellitus without complication (HCC)   . GERD (gastroesophageal reflux disease)   . Hypertension   . Mitral valve prolapse     Past Surgical History:  Procedure Laterality Date  . ABDOMINAL HYSTERECTOMY    . ABDOMINAL HYSTERECTOMY    . BACK SURGERY    . CHOLECYSTECTOMY    . KNEE SURGERY      Family history: Mother with leukemia, DM and HTN.  Father with lung cancer  Social History:  reports that she quit smoking about 21 years ago. She has never used smokeless tobacco. She reports that she drinks alcohol. She reports that she uses drugs, including Marijuana.  Allergies:  Allergies  Allergen Reactions  . Lisinopril     REACTION: COUGH  . Moxifloxacin     REACTION: N \\T \ V  . Sulfonamide Derivatives Other (See Comments)    Pt cannot remember rxn    Medications:  I have reviewed the patient's current medications. Prior to Admission:  Prescriptions Prior to Admission  Medication Sig Dispense Refill Last Dose  . aspirin EC 81 MG tablet Take 1 tablet (81 mg total) by mouth daily.   06/22/2017 at Unknown time  . atenolol (TENORMIN) 50 MG tablet TAKE ONE TABLET BY MOUTH  DAILY 90 tablet 0 06/22/2017 at Unknown time  . atenolol (TENORMIN) 50 MG tablet TAKE 1 TABLET BY MOUTH ONCE DAILY 90 tablet 0 06/22/2017 at Unknown time  .  hydrochlorothiazide (HYDRODIURIL) 25 MG tablet TAKE 1 TABLET BY MOUTH ONCE DAILY 90 tablet 0 06/22/2017 at Unknown time  . ALPRAZolam (XANAX) 1 MG tablet TAKE 1 TABLET BY MOUTH AT BEDTIME AS NEEDED FOR SLEEP FOR ANXIETY 30 tablet 0 prn at prn  . fluticasone (FLONASE) 50 MCG/ACT nasal spray Place 2 sprays into both nostrils daily. 16 g 11 prn at prn  . oxyCODONE-acetaminophen (PERCOCET) 10-325 MG tablet Take 1 tablet by mouth every 6 (six) hours as needed.  0 prn at prn   Scheduled: . aspirin  300 mg Rectal Daily   Or  . aspirin  325 mg Oral Daily  . atenolol  50 mg Oral Daily  . atorvastatin  20 mg Oral q1800  . enoxaparin (LOVENOX) injection  40 mg Subcutaneous Q24H  . hydrochlorothiazide  25 mg Oral Daily  . insulin aspart  0-9 Units Subcutaneous TID WC    ROS: History obtained from the patient  General ROS: negative for - chills, fatigue, fever, night sweats, weight gain or weight loss Psychological ROS: negative for - behavioral disorder, hallucinations, memory difficulties, mood swings or suicidal ideation Ophthalmic ROS: negative for - blurry vision, double vision, eye pain or loss of vision ENT ROS: negative for - epistaxis, nasal discharge, oral lesions, sore throat, tinnitus or vertigo Allergy and  Immunology ROS: negative for - hives or itchy/watery eyes Hematological and Lymphatic ROS: negative for - bleeding problems, bruising or swollen lymph nodes Endocrine ROS: negative for - galactorrhea, hair pattern changes, polydipsia/polyuria or temperature intolerance Respiratory ROS: negative for - cough, hemoptysis, shortness of breath or wheezing Cardiovascular ROS: negative for - chest pain, dyspnea on exertion, edema or irregular heartbeat Gastrointestinal ROS: negative for - abdominal pain, diarrhea, hematemesis, nausea/vomiting or stool incontinence Genito-Urinary ROS: negative for - dysuria, hematuria, incontinence or urinary frequency/urgency Musculoskeletal ROS: negative for  - joint swelling or muscular weakness Neurological ROS: as noted in HPI Dermatological ROS: negative for rash and skin lesion changes  Physical Examination: Blood pressure 131/72, pulse (!) 56, temperature 98.3 F (36.8 C), temperature source Oral, resp. rate 18, height  (1.676 m), weight 84.1 kg (185 lb 4.8 oz), SpO2 100 %.  HEENT-  Normocephalic, no lesions, without obvious abnormality.  Normal external eye and conjunctiva.  Normal TM's bilaterally.  Normal auditory canals and external ears. Normal external nose, mucus membranes and septum.  Normal pharynx. Cardiovascular- S1, S2 normal, pulses palpable throughout   Lungs- chest clear, no wheezing, rales, normal symmetric air entry Abdomen- soft, non-tender; bowel sounds normal; no masses,  no organomegaly Extremities- no edema Lymph-no adenopathy palpable Musculoskeletal-no joint tenderness, deformity or swelling Skin-warm and dry, no hyperpigmentation, vitiligo, or suspicious lesions  Neurological Examination   Mental Status: Alert, oriented, thought content appropriate.  Speech fluent without evidence of aphasia.  Able to follow 3 step commands without difficulty. Cranial Nerves: II: Discs flat bilaterally; Visual fields grossly normal, pupils equal, round, reactive to light and accommodation III,IV, VI: ptosis not present, extra-ocular motions intact bilaterally V,VII: smile symmetric, facial light touch sensation normal bilaterally VIII: hearing normal bilaterally IX,X: gag reflex present XI: bilateral shoulder shrug XII: midline tongue extension Motor: Right : Upper extremity   5/5    Left:     Upper extremity   5/5  Lower extremity   5/5     Lower extremity   5/5 Tone and bulk:normal tone throughout; no atrophy noted Sensory: Pinprick and light touch intact throughout, bilaterally Deep Tendon Reflexes: 2+ and symmetric with absent AJ's bilaterally Plantars: Right: mute   Left: mute Cerebellar: Normal finger-to-nose  and normal heel-to-shin testing bilaterally Gait: normal gait and station    Laboratory Studies:  Basic Metabolic Panel:  Recent Labs Lab 06/23/17 1005  NA 136  K 3.7  CL 99*  CO2 26  GLUCOSE 100*  BUN 17  CREATININE 0.67  CALCIUM 9.7    Liver Function Tests: No results for input(s): AST, ALT, ALKPHOS, BILITOT, PROT, ALBUMIN in the last 168 hours. No results for input(s): LIPASE, AMYLASE in the last 168 hours. No results for input(s): AMMONIA in the last 168 hours.  CBC:  Recent Labs Lab 06/23/17 1005  WBC 8.5  HGB 14.1  HCT 41.7  MCV 82.9  PLT 260    Cardiac Enzymes: No results for input(s): CKTOTAL, CKMB, CKMBINDEX, TROPONINI in the last 168 hours.  BNP: Invalid input(s): POCBNP  CBG:  Recent Labs Lab 06/23/17 1017 06/23/17 1835 06/23/17 2135 06/24/17 0750  GLUCAP 96 121* 111* 115*    Microbiology: Results for orders placed or performed during the hospital encounter of 08/18/08  Wet prep, genital     Status: Abnormal   Collection Time: 08/18/08  5:16 AM  Result Value Ref Range Status   Yeast Wet Prep HPF POC NONE SEEN NONE SEEN Final   Trich, Wet  Prep NONE SEEN NONE SEEN Final   Clue Cells Wet Prep HPF POC FEW (A) NONE SEEN Final   WBC, Wet Prep HPF POC RARE (A) NONE SEEN Final    Coagulation Studies: No results for input(s): LABPROT, INR in the last 72 hours.  Urinalysis:  Recent Labs Lab 06/23/17 1005  COLORURINE YELLOW*  LABSPEC 1.013  PHURINE 6.0  GLUCOSEU NEGATIVE  HGBUR NEGATIVE  BILIRUBINUR NEGATIVE  KETONESUR NEGATIVE  PROTEINUR NEGATIVE  NITRITE NEGATIVE  LEUKOCYTESUR NEGATIVE    Lipid Panel:    Component Value Date/Time   CHOL 253 (H) 06/23/2017 1005   TRIG 182 (H) 06/23/2017 1005   HDL 54 06/23/2017 1005   CHOLHDL 4.7 06/23/2017 1005   VLDL 36 06/23/2017 1005   LDLCALC 163 (H) 06/23/2017 1005    HgbA1C:  Lab Results  Component Value Date   HGBA1C 6.1 (H) 06/23/2017    Urine Drug Screen:     Component  Value Date/Time   LABOPIA NONE DETECTED 06/23/2008 1631   COCAINSCRNUR NONE DETECTED 06/23/2008 1631   LABBENZ POSITIVE (A) 06/23/2008 1631   AMPHETMU NONE DETECTED 06/23/2008 1631   THCU POSITIVE (A) 06/23/2008 1631   LABBARB  06/23/2008 1631    NONE DETECTED        DRUG SCREEN FOR MEDICAL PURPOSES ONLY.  IF CONFIRMATION IS NEEDED FOR ANY PURPOSE, NOTIFY LAB WITHIN 5 DAYS.    Alcohol Level: No results for input(s): ETH in the last 168 hours.  Other results: EKG: sinus bradycardia at 50 bpm.  Imaging: Ct Head Wo Contrast  Result Date: 06/23/2017 CLINICAL DATA:  Pt reports for the past several days has had intermittent episodes of dizziness and this am she started having a black spot in the vision of her left eye. EXAM: CT HEAD WITHOUT CONTRAST TECHNIQUE: Contiguous axial images were obtained from the base of the skull through the vertex without intravenous contrast. COMPARISON:  None. FINDINGS: Brain: No evidence of acute infarction, hemorrhage, extra-axial collection, ventriculomegaly, or mass effect. Generalized cerebral atrophy. Periventricular white matter low attenuation likely secondary to microangiopathy. Vascular: Cerebrovascular atherosclerotic calcifications are noted. Skull: Negative for fracture or focal lesion. Sinuses/Orbits: Visualized portions of the orbits are unremarkable. Visualized portions of the paranasal sinuses and mastoid air cells are unremarkable. Other: None. IMPRESSION: 1. No acute intracranial pathology. 2. Chronic microvascular disease and cerebral atrophy. Electronically Signed   By: Elige Ko   On: 06/23/2017 13:48   Mr Brain Wo Contrast  Result Date: 06/23/2017 CLINICAL DATA:  Transient loss of vision in the lateral left field this morning. EXAM: MRI HEAD WITHOUT CONTRAST MRA HEAD WITHOUT CONTRAST TECHNIQUE: Multiplanar, multiecho pulse sequences of the brain and surrounding structures were obtained without intravenous contrast. Angiographic images of the  head were obtained using MRA technique without contrast. COMPARISON:  None. FINDINGS: MRI HEAD FINDINGS Brain: No acute infarction, hemorrhage, hydrocephalus, extra-axial collection or mass lesion. Between 10 and 20 FLAIR hyperintensities in the cerebral white matter, nonspecific but likely from mild chronic small vessel ischemia given patient's vascular risk factors. Normal brain volume. Clear suprasellar cistern. No abnormality noted along the optic pathways. Vascular: Arterial findings below. Normal dural venous sinus flow voids. Skull and upper cervical spine: No evidence of marrow lesion. Extensive ACDF. Sinuses/Orbits: Negative.  No explanation for abnormal vision. MRA HEAD FINDINGS Symmetric carotid or vertebral arteries. No branch occlusion. No reversible stenosis accounting for artifactual irregularity and narrowing of ICAs at the skull base. 1.5 mm outpouching inferiorly from the right supraclinoid ICA.  Symmetric appearance of branch vessels, no beading or convincing atheromatous changes. IMPRESSION: Brain MRI: 1. No acute finding including infarct.  No explanation for symptoms. 2. Few signal abnormalities in the cerebral white matter, nonspecific but likely from chronic small vessel ischemia. Intracranial MRA: 1. No acute finding.  No noted stenotic or ulcerative lesion. 2. 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 Electronically Signed   By: Marnee Spring M.D.   On: 06/23/2017 18:50   US Carotid Bilateral (at [facility] And [facility] Only)  Result Date: 06/23/2017 CLINICAL DATA:  TIA. Syncopal episode. Visual disturbance. History of hypertension and diabetes. Former smoker. EXAM: BILATERAL CAROTID DUPLEX ULTRASOUND TECHNIQUE: Wallace Cullens scale imaging, color Doppler and duplex ultrasound were performed of bilateral carotid and vertebral arteries in the neck. COMPARISON:  None. FINDINGS: Criteria: Quantification of carotid stenosis is based on velocity parameters that correlate the residual  internal carotid diameter with NASCET-based stenosis levels, using the diameter of the distal internal carotid lumen as the denominator for stenosis measurement. The following velocity measurements were obtained: RIGHT ICA:  77/26 cm/sec CCA:  104/17 cm/sec SYSTOLIC ICA/CCA RATIO:  0.7 DIASTOLIC ICA/CCA RATIO:  1.5 ECA:  73 cm/sec LEFT ICA:  81/27 cm/sec CCA:  89/18 cm/sec SYSTOLIC ICA/CCA RATIO:  0.9 DIASTOLIC ICA/CCA RATIO:  1.5 ECA:  78 cm/sec RIGHT CAROTID ARTERY: There is a minimal amount of atherosclerotic plaque involving the origin and proximal aspects of the right internal carotid artery (image 24), not resulting in elevated peak systolic velocities within the interrogated course the right internal carotid artery to suggest a hemodynamically significant stenosis. RIGHT VERTEBRAL ARTERY:  Antegrade flow LEFT CAROTID ARTERY: There is a minimal amount of intimal thickening within the left carotid bulb (image 50), extending to involve the origin and proximal aspects of the left internal carotid artery (image 58), not resulting in elevated peak systolic velocities within the interrogated course the left internal carotid artery to suggest a hemodynamically significant stenosis. LEFT VERTEBRAL ARTERY:  Antegrade flow IMPRESSION: There is a minimal amount of bilateral intimal thickening and atherosclerotic plaque, right greater than left, not resulting in a hemodynamically significant stenosis within either internal carotid artery. Electronically Signed   By: Simonne Come M.D.   On: 06/23/2017 17:28   Mr Maxine Glenn Head/brain LK Cm  Result Date: 06/23/2017 CLINICAL DATA:  Transient loss of vision in the lateral left field this morning. EXAM: MRI HEAD WITHOUT CONTRAST MRA HEAD WITHOUT CONTRAST TECHNIQUE: Multiplanar, multiecho pulse sequences of the brain and surrounding structures were obtained without intravenous contrast. Angiographic images of the head were obtained using MRA technique without contrast. COMPARISON:   None. FINDINGS: MRI HEAD FINDINGS Brain: No acute infarction, hemorrhage, hydrocephalus, extra-axial collection or mass lesion. Between 10 and 20 FLAIR hyperintensities in the cerebral white matter, nonspecific but likely from mild chronic small vessel ischemia given patient's vascular risk factors. Normal brain volume. Clear suprasellar cistern. No abnormality noted along the optic pathways. Vascular: Arterial findings below. Normal dural venous sinus flow voids. Skull and upper cervical spine: No evidence of marrow lesion. Extensive ACDF. Sinuses/Orbits: Negative.  No explanation for abnormal vision. MRA HEAD FINDINGS Symmetric carotid or vertebral arteries. No branch occlusion. No reversible stenosis accounting for artifactual irregularity and narrowing of ICAs at the skull base. 1.5 mm outpouching inferiorly from the right supraclinoid ICA. Symmetric appearance of branch vessels, no beading or convincing atheromatous changes. IMPRESSION: Brain MRI: 1. No acute finding including infarct.  No explanation for symptoms. 2. Few signal abnormalities in the  cerebral white matter, nonspecific but likely from chronic small vessel ischemia. Intracranial MRA: 1. No acute finding.  No noted stenotic or ulcerative lesion. 2. 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 Electronically Signed   By: Marnee Spring M.D.   On: 06/23/2017 18:50    Assessment: 56 y.o. female presenting with an episode of transient visual field loss.  Now at baseline.  TIA suspected.  On ASA at home.  MRI of the brain reviewed and shows no acute changes.  Carotid dopplers show no evidence of hemodynamically significant stenosis.  Echocardiogram shows no cardiac source of emboli with an EF of 55-60%.  A1c 6.1, LDL 163.  Stroke Risk Factors - diabetes mellitus, hyperlipidemia and hypertension  Plan: 1. Prophylactic therapy-Patient advised to change from ASA to Plavix but she refuses at this time due to some things that she  has seen on TV.  Will remain on ASA. 2. Statin for lipid management with target LDL<70. 3. Patient to continue follow up with outpatient MD  Case discussed with Dr. Abran Cantor, MD Neurology [phone] 06/24/2017, 10:43 AM

## 2017-06-24 NOTE — Discharge Summary (Signed)
[employer] [facility] -  at [facility]   PATIENT NAME: Mariah [name]    MR#:  [id number]  DATE OF BIRTH:  02/12/1961  DATE OF ADMISSION:  06/23/2017 ADMITTING PHYSICIAN: Enedina Finner, MD  DATE OF DISCHARGE: 06/24/2017 11:00 AM  PRIMARY CARE PHYSICIAN: Mayo, Allyn Kenner, MD    ADMISSION DIAGNOSIS:  TIA (transient ischemic attack) [G45.9]  DISCHARGE DIAGNOSIS:  Active Problems:   TIA (transient ischemic attack)   SECONDARY DIAGNOSIS:   Past Medical History:  Diagnosis Date  . Degenerative disc disease, lumbar   . Diabetes mellitus without complication (HCC)   . GERD (gastroesophageal reflux disease)   . Hypertension   . Mitral valve prolapse     HOSPITAL COURSE:   56 year old female with past medical history of mitral prolapse, hypertension, GERD, diabetes, degenerative disc disease who presented to the hospital due to sudden loss of vision and suspected to have a TIA/CVA.  1. TIA/CVA-this was the working diagnosis given patient's transient symptoms of vision loss as mentioned above which had resolved upon admission. Patient underwent an extensive neurologic workup including CT head, MRI/MRA of the brain which are negative for any acute pathology. Neurologist saw the patient and recommended switching her from aspirin to Plavix but she would rather have this discussion with a primary care physician and at this point therefore she is only being discharged on baby aspirin daily. -We'll also discharge on the low-dose atorvastatin.  2. Essential hypertension-patient will continue atenolol, hydrochlorothiazide.  3. Anxiety-patient will continue her Xanax as needed.  4. GERD-patient will continue her Nexium.    DISCHARGE CONDITIONS:   Stable  CONSULTS OBTAINED:  Treatment Team:  Thana Farr, MD  DRUG ALLERGIES:   Allergies  Allergen Reactions  . Lisinopril     REACTION: COUGH  . Moxifloxacin     REACTION: N \\T \ V  . Sulfonamide Derivatives Other  (See Comments)    Pt cannot remember rxn    DISCHARGE MEDICATIONS:   Allergies as of 06/24/2017      Reactions   Lisinopril    REACTION: COUGH   Moxifloxacin    REACTION: N \\T \ V   Sulfonamide Derivatives Other (See Comments)   Pt cannot remember rxn      Medication List    TAKE these medications   ALPRAZolam 1 MG tablet Commonly known as:  XANAX TAKE 1 TABLET BY MOUTH AT BEDTIME AS NEEDED FOR SLEEP FOR ANXIETY   aspirin EC 81 MG tablet Take 1 tablet (81 mg total) by mouth daily.   atenolol 50 MG tablet Commonly known as:  TENORMIN TAKE ONE TABLET BY MOUTH  DAILY   atenolol 50 MG tablet Commonly known as:  TENORMIN TAKE 1 TABLET BY MOUTH ONCE DAILY   atorvastatin 20 MG tablet Commonly known as:  LIPITOR Take 1 tablet (20 mg total) by mouth daily at 6 PM.   fluticasone 50 MCG/ACT nasal spray Commonly known as:  FLONASE Place 2 sprays into both nostrils daily.   hydrochlorothiazide 25 MG tablet Commonly known as:  HYDRODIURIL TAKE 1 TABLET BY MOUTH ONCE DAILY   oxyCODONE-acetaminophen 10-325 MG tablet Commonly known as:  PERCOCET Take 1 tablet by mouth every 6 (six) hours as needed.         DISCHARGE INSTRUCTIONS:   DIET:  Cardiac diet  DISCHARGE CONDITION:  Stable  ACTIVITY:  Activity as tolerated  OXYGEN:  Home Oxygen: No.   Oxygen Delivery: room air  DISCHARGE LOCATION:  home   If you experience worsening  of your admission symptoms, develop shortness of breath, life threatening emergency, suicidal or homicidal thoughts you must seek medical attention immediately by calling 911 or calling your MD immediately  if symptoms less severe.  You Must read complete instructions/literature along with all the possible adverse reactions/side effects for all the Medicines you take and that have been prescribed to you. Take any new Medicines after you have completely understood and accpet all the possible adverse reactions/side effects.   Please  note  You were cared for by a hospitalist during your hospital stay. If you have any questions about your discharge medications or the care you received while you were in the hospital after you are discharged, you can call the unit and asked to speak with the hospitalist on call if the hospitalist that took care of you is not available. Once you are discharged, your primary care physician will handle any further medical issues. Please note that NO REFILLS for any discharge medications will be authorized once you are discharged, as it is imperative that you return to your primary care physician (or establish a relationship with a primary care physician if you do not have one) for your aftercare needs so that they can reassess your need for medications and monitor your lab values.     Today   No headache, No visual symptoms.  No focal weakness. Will d/c home today.   VITAL SIGNS:  Blood pressure 131/72, pulse (!) 56, temperature 98.3 F (36.8 C), temperature source Oral, resp. rate 18, height  (1.676 m), weight 84.1 kg (185 lb 4.8 oz), SpO2 100 %.  I/O:   Intake/Output Summary (Last 24 hours) at 06/24/17 1519 Last data filed at 06/24/17 1003  Gross per 24 hour  Intake              240 ml  Output                0 ml  Net              240 ml    PHYSICAL EXAMINATION:   GENERAL:  56 y.o.-year-old patient lying in the bed with no acute distress.  EYES: Pupils equal, round, reactive to light and accommodation. No scleral icterus. Extraocular muscles intact.  HEENT: Head atraumatic, normocephalic. Oropharynx and nasopharynx clear.  NECK:  Supple, no jugular venous distention. No thyroid enlargement, no tenderness.  LUNGS: Normal breath sounds bilaterally, no wheezing, rales,rhonchi. No use of accessory muscles of respiration.  CARDIOVASCULAR: S1, S2 normal. No murmurs, rubs, or gallops.  ABDOMEN: Soft, non-tender, non-distended. Bowel sounds present. No organomegaly or mass.   EXTREMITIES: No pedal edema, cyanosis, or clubbing.  NEUROLOGIC: Cranial nerves II through XII are intact. No focal motor or sensory defecits b/l.  PSYCHIATRIC: The patient is alert and oriented x 3.  SKIN: No obvious rash, lesion, or ulcer.   DATA REVIEW:   CBC  Recent Labs Lab 06/23/17 1005  WBC 8.5  HGB 14.1  HCT 41.7  PLT 260    Chemistries   Recent Labs Lab 06/23/17 1005  NA 136  K 3.7  CL 99*  CO2 26  GLUCOSE 100*  BUN 17  CREATININE 0.67  CALCIUM 9.7    Cardiac Enzymes No results for input(s): TROPONINI in the last 168 hours.  Microbiology Results  Results for orders placed or performed during the hospital encounter of 08/18/08  Wet prep, genital     Status: Abnormal   Collection Time: 08/18/08  5:16 AM  Result Value Ref Range Status   Yeast Wet Prep HPF POC NONE SEEN NONE SEEN Final   Trich, Wet Prep NONE SEEN NONE SEEN Final   Clue Cells Wet Prep HPF POC FEW (A) NONE SEEN Final   WBC, Wet Prep HPF POC RARE (A) NONE SEEN Final    RADIOLOGY:  Ct Head Wo Contrast  Result Date: 06/23/2017 CLINICAL DATA:  Pt reports for the past several days has had intermittent episodes of dizziness and this am she started having a black spot in the vision of her left eye. EXAM: CT HEAD WITHOUT CONTRAST TECHNIQUE: Contiguous axial images were obtained from the base of the skull through the vertex without intravenous contrast. COMPARISON:  None. FINDINGS: Brain: No evidence of acute infarction, hemorrhage, extra-axial collection, ventriculomegaly, or mass effect. Generalized cerebral atrophy. Periventricular white matter low attenuation likely secondary to microangiopathy. Vascular: Cerebrovascular atherosclerotic calcifications are noted. Skull: Negative for fracture or focal lesion. Sinuses/Orbits: Visualized portions of the orbits are unremarkable. Visualized portions of the paranasal sinuses and mastoid air cells are unremarkable. Other: None. IMPRESSION: 1. No acute  intracranial pathology. 2. Chronic microvascular disease and cerebral atrophy. Electronically Signed   By: Elige Ko   On: 06/23/2017 13:48   Mr Brain Wo Contrast  Result Date: 06/23/2017 CLINICAL DATA:  Transient loss of vision in the lateral left field this morning. EXAM: MRI HEAD WITHOUT CONTRAST MRA HEAD WITHOUT CONTRAST TECHNIQUE: Multiplanar, multiecho pulse sequences of the brain and surrounding structures were obtained without intravenous contrast. Angiographic images of the head were obtained using MRA technique without contrast. COMPARISON:  None. FINDINGS: MRI HEAD FINDINGS Brain: No acute infarction, hemorrhage, hydrocephalus, extra-axial collection or mass lesion. Between 10 and 20 FLAIR hyperintensities in the cerebral white matter, nonspecific but likely from mild chronic small vessel ischemia given patient's vascular risk factors. Normal brain volume. Clear suprasellar cistern. No abnormality noted along the optic pathways. Vascular: Arterial findings below. Normal dural venous sinus flow voids. Skull and upper cervical spine: No evidence of marrow lesion. Extensive ACDF. Sinuses/Orbits: Negative.  No explanation for abnormal vision. MRA HEAD FINDINGS Symmetric carotid or vertebral arteries. No branch occlusion. No reversible stenosis accounting for artifactual irregularity and narrowing of ICAs at the skull base. 1.5 mm outpouching inferiorly from the right supraclinoid ICA. Symmetric appearance of branch vessels, no beading or convincing atheromatous changes. IMPRESSION: Brain MRI: 1. No acute finding including infarct.  No explanation for symptoms. 2. Few signal abnormalities in the cerebral white matter, nonspecific but likely from chronic small vessel ischemia. Intracranial MRA: 1. No acute finding.  No noted stenotic or ulcerative lesion. 2. 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 Electronically Signed   By: Marnee Spring M.D.   On: 06/23/2017 18:50   US  Carotid Bilateral (at [facility] Only)  Result Date: 06/23/2017 CLINICAL DATA:  TIA. Syncopal episode. Visual disturbance. History of hypertension and diabetes. Former smoker. EXAM: BILATERAL CAROTID DUPLEX ULTRASOUND TECHNIQUE: Wallace Cullens scale imaging, color Doppler and duplex ultrasound were performed of bilateral carotid and vertebral arteries in the neck. COMPARISON:  None. FINDINGS: Criteria: Quantification of carotid stenosis is based on velocity parameters that correlate the residual internal carotid diameter with NASCET-based stenosis levels, using the diameter of the distal internal carotid lumen as the denominator for stenosis measurement. The following velocity measurements were obtained: RIGHT ICA:  77/26 cm/sec CCA:  104/17 cm/sec SYSTOLIC ICA/CCA RATIO:  0.7 DIASTOLIC ICA/CCA RATIO:  1.5 ECA:  73 cm/sec LEFT ICA:  81/27 cm/sec CCA:  89/18 cm/sec SYSTOLIC ICA/CCA RATIO:  0.9 DIASTOLIC ICA/CCA RATIO:  1.5 ECA:  78 cm/sec RIGHT CAROTID ARTERY: There is a minimal amount of atherosclerotic plaque involving the origin and proximal aspects of the right internal carotid artery (image 24), not resulting in elevated peak systolic velocities within the interrogated course the right internal carotid artery to suggest a hemodynamically significant stenosis. RIGHT VERTEBRAL ARTERY:  Antegrade flow LEFT CAROTID ARTERY: There is a minimal amount of intimal thickening within the left carotid bulb (image 50), extending to involve the origin and proximal aspects of the left internal carotid artery (image 58), not resulting in elevated peak systolic velocities within the interrogated course the left internal carotid artery to suggest a hemodynamically significant stenosis. LEFT VERTEBRAL ARTERY:  Antegrade flow IMPRESSION: There is a minimal amount of bilateral intimal thickening and atherosclerotic plaque, right greater than left, not resulting in a hemodynamically significant stenosis within either internal carotid  artery. Electronically Signed   By: Simonne Come M.D.   On: 06/23/2017 17:28   Mr Maxine Glenn Head/brain ZO Cm  Result Date: 06/23/2017 CLINICAL DATA:  Transient loss of vision in the lateral left field this morning. EXAM: MRI HEAD WITHOUT CONTRAST MRA HEAD WITHOUT CONTRAST TECHNIQUE: Multiplanar, multiecho pulse sequences of the brain and surrounding structures were obtained without intravenous contrast. Angiographic images of the head were obtained using MRA technique without contrast. COMPARISON:  None. FINDINGS: MRI HEAD FINDINGS Brain: No acute infarction, hemorrhage, hydrocephalus, extra-axial collection or mass lesion. Between 10 and 20 FLAIR hyperintensities in the cerebral white matter, nonspecific but likely from mild chronic small vessel ischemia given patient's vascular risk factors. Normal brain volume. Clear suprasellar cistern. No abnormality noted along the optic pathways. Vascular: Arterial findings below. Normal dural venous sinus flow voids. Skull and upper cervical spine: No evidence of marrow lesion. Extensive ACDF. Sinuses/Orbits: Negative.  No explanation for abnormal vision. MRA HEAD FINDINGS Symmetric carotid or vertebral arteries. No branch occlusion. No reversible stenosis accounting for artifactual irregularity and narrowing of ICAs at the skull base. 1.5 mm outpouching inferiorly from the right supraclinoid ICA. Symmetric appearance of branch vessels, no beading or convincing atheromatous changes. IMPRESSION: Brain MRI: 1. No acute finding including infarct.  No explanation for symptoms. 2. Few signal abnormalities in the cerebral white matter, nonspecific but likely from chronic small vessel ischemia. Intracranial MRA: 1. No acute finding.  No noted stenotic or ulcerative lesion. 2. 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 Electronically Signed   By: Marnee Spring M.D.   On: 06/23/2017 18:50      Management plans discussed with the patient, family and they are  in agreement.  CODE STATUS:     Code Status Orders        Start     Ordered   06/23/17 1615  Full code  Continuous     06/23/17 1615    Code Status History    Date Active Date Inactive Code Status Order ID Comments User Context   This patient has a current code status but no historical code status.      TOTAL TIME TAKING CARE OF THIS PATIENT: 40 minutes.    Houston Siren M.D on 06/24/2017 at 3:19 PM  Between 7am to 6pm - Pager - [phone]  After 6pm go to [email or web address] - Scientist, research (life sciences)  Hospitalists  Office  [phone]  CC: Primary care physician; Mayo, Allyn Kenner, MD

## 2017-06-24 NOTE — Progress Notes (Signed)
Pt is d/ced home.  No further blurriness in eye.  No visual deficit; NIHSS = 0.  Patient has no complaints - A&O and independent. She will go home on new regimen of 81 mg asp and lipitor.  Pt is committed to changing lifestyle - diet and exercise to reduce triglycerides and LDL.  Pt being transported home by daughter.  Reviewed d/c instructions and gave pt information on lipitor.

## 2017-06-25 ENCOUNTER — Telehealth: Payer: Self-pay | Admitting: *Deleted

## 2017-06-25 ENCOUNTER — Ambulatory Visit: Payer: Self-pay | Admitting: Internal Medicine

## 2017-06-25 ENCOUNTER — Ambulatory Visit (INDEPENDENT_AMBULATORY_CARE_PROVIDER_SITE_OTHER): Payer: Medicaid Other | Admitting: Internal Medicine

## 2017-06-25 ENCOUNTER — Encounter: Payer: Self-pay | Admitting: Internal Medicine

## 2017-06-25 DIAGNOSIS — G459 Transient cerebral ischemic attack, unspecified: Secondary | ICD-10-CM

## 2017-06-25 DIAGNOSIS — E782 Mixed hyperlipidemia: Secondary | ICD-10-CM

## 2017-06-25 MED ORDER — ATORVASTATIN CALCIUM 40 MG PO TABS: 40.0000 mg | ORAL_TABLET | Freq: Every day | ORAL | 3 refills | Status: DC

## 2017-06-25 MED ORDER — CLOPIDOGREL BISULFATE 75 MG PO TABS: 75.0000 mg | ORAL_TABLET | Freq: Every day | ORAL | 0 refills | Status: DC

## 2017-06-25 MED ORDER — ATORVASTATIN CALCIUM 20 MG PO TABS: 40.0000 mg | ORAL_TABLET | Freq: Every day | ORAL | 1 refills | Status: DC

## 2017-06-25 NOTE — Patient Instructions (Addendum)
It was so wonderful to see you!  We have increased your Lipitor from  to  daily.  We have also switched you from Aspirin to Plavix. Please take 1 tablet daily.  We will see you back in 6 months.  -Dr. Nancy Marus

## 2017-06-25 NOTE — Progress Notes (Signed)
   [facility] Phone: [phone]  Subjective:  Mariah [name] is a 56 year old female presenting to clinic for hospital follow-up visit. She was hospitalized at [facility] from 06/23/17-06/24/17 for TIA. She states that her vision went black in the outside corner of the right eye suddenly on the day prior to admission. The vision change "came out of no where" and lasted 2-3 minutes before resolving spontaneously. She states her vision has been completely normal since then. While in the hospital, the neurologist recommend switching from aspirin  to plavix. Patient wanted to wait and think about this. She was also started on Lipitor  daily, due to an LDL of 163. She denies any dizziness, slurred speech, numbness, or weakness.  ROS: See HPI for pertinent positives and negatives  Past Medical History- hx TIA, HTN, MVP, allergic rhinitis, cervical myelopathy, DDD of lumbar spine, anxiety, obesity, prediabetes  Family history reviewed for today's visit. No changes.  Social history- patient is a former smoker  Objective: BP 118/64   Pulse 65   Temp 98.5 F (36.9 C) (Oral)   Wt 191 lb (86.6 kg)   SpO2 98%   BMI 30.83 kg/m  Gen: NAD, alert, cooperative with exam HEENT: NCAT, EOMI Neuro: Alert and oriented, no gross deficits, gait normal, speech normal, vision intact.  Assessment/Plan: TIA: Had blindness in the outside corner of her right eye that lasted 2-3 minutes and resolved spontaneously. Work-up overall negative the hospital, but did have a lipid panel significant for LDL 163. Neuro recommended switching from aspirin  to plavix, but patient wanted to wait on this. ASCVD risk 11.6%. - Had a long discussion regarding risks and benefits. - Switch from aspirin to plavix  daily - Increase Lipitor from  to  qhs and monitor for side effects. - Follow-up in 6 months   Willadean Carol, MD PGY-3

## 2017-06-25 NOTE — Telephone Encounter (Signed)
Pt was in office for a visit today and she got to her car and forgot to tell PCP that she needed a refill on her Xanax , she would like it to be sent to the [facility] at [facility]. Tried to catch PCP but she had already left.  Lamonte Sakai, April D, [place]

## 2017-06-26 DIAGNOSIS — E785 Hyperlipidemia, unspecified: Secondary | ICD-10-CM | POA: Insufficient documentation

## 2017-06-26 MED ORDER — CLOPIDOGREL BISULFATE 75 MG PO TABS: 75.0000 mg | ORAL_TABLET | Freq: Every day | ORAL | 0 refills | Status: DC

## 2017-06-26 MED ORDER — ALPRAZOLAM 1 MG PO TABS: ORAL_TABLET | ORAL | 0 refills | Status: DC

## 2017-06-26 NOTE — Telephone Encounter (Signed)
Medication called into [facility] per patient request.  Her usual pharmacy [facility] was closed and they never received her script for plavix.  Loris Winrow,CMA

## 2017-06-26 NOTE — Telephone Encounter (Signed)
Can we call this into her pharmacy? Xanax  tablet. Take 1 tablet by mouth at bedtime as needed for sleep or anxiety. #30 tablets, 0 refills. Thanks!

## 2017-06-26 NOTE — Assessment & Plan Note (Addendum)
Had blindness in the outside corner of her right eye that lasted 2-3 minutes and resolved spontaneously. Work-up overall negative the hospital, but did have a lipid panel significant for LDL 163. Neuro recommended switching from aspirin  to plavix, but patient wanted to wait on this. ASCVD risk 11.6%. - Had a long discussion regarding risks and benefits. - Switch from aspirin to plavix  daily - Increase Lipitor from  to  qhs and monitor for side effects. - Follow-up in 6 months

## 2017-06-29 ENCOUNTER — Other Ambulatory Visit: Payer: Self-pay | Admitting: *Deleted

## 2017-06-29 MED ORDER — FLUTICASONE PROPIONATE 50 MCG/ACT NA SUSP: 2.0000 | Freq: Every day | NASAL | 11 refills | Status: DC

## 2017-06-29 NOTE — Telephone Encounter (Signed)
Patient calling for flonase refill.

## 2017-06-30 ENCOUNTER — Encounter: Payer: Self-pay | Admitting: Family Medicine

## 2017-06-30 ENCOUNTER — Ambulatory Visit (INDEPENDENT_AMBULATORY_CARE_PROVIDER_SITE_OTHER): Payer: Medicaid Other | Admitting: Family Medicine

## 2017-06-30 VITALS — BP 122/80 | HR 60 | Temp 97.9°F | Wt 192.0 lb

## 2017-06-30 DIAGNOSIS — R42 Dizziness and giddiness: Secondary | ICD-10-CM

## 2017-06-30 NOTE — Progress Notes (Signed)
   Subjective:   Patient ID: Mariah [name]    DOB: [birth year], 56 y.o. female   MRN: [id number]  CC: "Lightheadedness"  HPI: Mariah [name] is a 56 y.o. female who presents for a same day appointment for the following:  DIZZINESS  Feeling dizzy for 6 days.  Dizziness is constant after taking the newly prescribed plavix Feels like room spins: no Lightheadedness when stands: no Palpitations or heart racing: yes (has MVP) Prior dizziness: yes (4-5 days prior to TIA) Medications tried: no Taking blood thinners: plavix  Symptoms Hearing Loss: no Ear Pain or fullness: no Nausea or vomiting: no Vision difficulty or double vision: no Falls: no Head trauma: no Weakness in arm or leg: no Speaking problems: no Headache: yes (c/w chronic headaches on frontal and occiput)  Of note, patient feels a "cool breeze on inside of face" but is not associated with episodes of lightheadedness.  Complete ROS performed, see HPI for pertinent.  PMFSH: HTN, obesity, MVP, anxiety, BPPV, cervical myelopathy, prediabetes, HLD, h/o TIA, GERD, allergic rhinitis. Surgical history back and knee surgery, cholecystectomy, TAH. Family history unremarkable. Smoking status reviewed. Medications reviewed.  Objective:   BP 122/80   Pulse 60   Temp 97.9 F (36.6 C) (Oral)   Wt 192 lb (87.1 kg)   SpO2 98%   BMI 30.99 kg/m  Vitals and nursing note reviewed.  General: well nourished, well developed, in no acute distress with non-toxic appearance HEENT: normocephalic, atraumatic, moist mucous membranes, PERRLA, EOMI Neck: supple, non-tender without lymphadenopathy CV: regular rate and rhythm without murmurs, rubs, or gallops, no lower extremity edema Lungs: clear to auscultation bilaterally with normal work of breathing Skin: warm, dry, no rashes or lesions, cap refill < 2 seconds Extremities: warm and well perfused, normal tone, 5/5 motor strength all 4 extremities Neuro: CNII-XII intact, no  dysarthria Psych: euthymic mood, congruent affect, anxious though non-tremulous  Assessment & Plan:   Episodic lightheadedness Subacute. Not consistent with history of vertigo. No red flags. Patient appears anxious which she admits may be the source of her symptoms but she wants to be sure it's not related to the Plavix. I do not see any side effect which would support this. At this point, benefits outweigh the risks. Was recently discharged from hospital for TIA. Complete neurological exam unremarkable. --Reassured patient symptoms are unlikely related to Plavix with recommendation to continue along with high intensity statin --Recommended patient keep a journal of her episodes and presented them to another appointment if they continue  No orders of the defined types were placed in this encounter.  No orders of the defined types were placed in this encounter.   Durward Parcel, DO [facility], PGY-2 06/30/2017 10:49 AM

## 2017-06-30 NOTE — Patient Instructions (Signed)
Thank you for coming in to see Mariah [name] today. Please see below to review our plan for today's visit.  1. Continue taking her Plavix and atorvastatin as prescribed. I do not think these are related to your lightheadedness. 2. Keep a journal of your lightheaded episodes including when they happen, however last, and the symptoms she feel. If these continue being an issue, please schedule appointment.  Please call the clinic at [phone] if your symptoms worsen or you have any concerns. It was my pleasure to see you. -- Durward Parcel, DO [facility], PGY-2

## 2017-06-30 NOTE — Assessment & Plan Note (Addendum)
Subacute. Not consistent with history of vertigo. No red flags. Patient appears anxious which she admits may be the source of her symptoms but she wants to be sure it's not related to the Plavix. I do not see any side effect which would support this. At this point, benefits outweigh the risks. Was recently discharged from hospital for TIA. Complete neurological exam unremarkable. --Reassured patient symptoms are unlikely related to Plavix with recommendation to continue along with high intensity statin --Recommended patient keep a journal of her episodes and presented them to another appointment if they continue

## 2017-07-06 ENCOUNTER — Ambulatory Visit (INDEPENDENT_AMBULATORY_CARE_PROVIDER_SITE_OTHER): Payer: Medicaid Other | Admitting: Internal Medicine

## 2017-07-06 ENCOUNTER — Encounter: Payer: Self-pay | Admitting: Internal Medicine

## 2017-07-06 DIAGNOSIS — F419 Anxiety disorder, unspecified: Secondary | ICD-10-CM

## 2017-07-06 NOTE — Patient Instructions (Signed)
It was so nice to see you!  It sounds like you are already doing a great job managing your anxiety. If you feel like you want to see a therapist or be started on a medication in the future, please let me know!  We will see you back in 6 months to recheck your cholesterol level, or earlier if needed.  -Dr. Nancy MarusMayo

## 2017-07-08 NOTE — Progress Notes (Signed)
   [facility] Phone: [phone]  Subjective:  Mariah [name] is a 56 year old female presenting to clinic for anxiety. She was hospitalized from 10/9-10/10 with TIA. She states that her anxiety has been worse since then. She is worried that something bad will happen to her. She also states that her feeling of anxiety are made worse after she talks to her sister. Her sister keeps saying things such as "you're lucky to be alive" and "we could have lost you". She states that this really upsets her. She has had anxiety for years and has been using Xanax as needed. She used to take this a few times per month, but now she finds herself taking it more frequently. She has been to therapy in the past, but states that she does not want to go back at this time, since she thinks it will make her anxiety worse to have too much on her plate. She states she has developed ways to cope with her anxiety, such as going for a walk, giving herself some time alone, or taking deep breaths.  ROS: See HPI for pertinent positives and negatives  Past Medical History- recent hx TIA, HTN, mitral valve prolapse, obesity, anxiety, HLD  Family history reviewed for today's visit. No changes.  Social history- patient is a former smoker  Objective: BP 124/60 (BP Location: Left Arm, Patient Position: Sitting, Cuff Size: Normal)   Pulse (!) 52   Temp 98.1 F (36.7 C) (Oral)   Ht 5\' 6"  (1.676 m)   Wt 193 lb (87.5 kg)   SpO2 97%   BMI 31.15 kg/m  Gen: NAD, alert, cooperative with exam Psych: Mildly increased rate of speech, anxious mood, normal thought content, normal behavior.  GAD-7: 7  Assessment/Plan: Anxiety Uncontrolled and has worsened since TIA. GAD-7 is a 7 today. Has been on Xanax for years. Does not like taking a lot of medications. Discussed in detail that I recommend starting a medication such as an SSRI to help the anxiety. Also recommended restarting therapy. Patient not interested in  either of these options today. Encouraged patient to continue to use the anxiety-relieving techniques that she already uses. Patient will follow-up when she is ready to start a medication or consider therapy.  Mariah CarolKaty Mayo, MD PGY-3

## 2017-07-08 NOTE — Assessment & Plan Note (Addendum)
Uncontrolled and has worsened since TIA. GAD-7 is a 7 today. Has been on Xanax for years. Does not like taking a lot of medications. Discussed in detail that I recommend starting a medication such as an SSRI to help the anxiety. Also recommended restarting therapy. Patient not interested in either of these options today. Encouraged patient to continue to use the anxiety-relieving techniques that she already uses. Patient will follow-up when she is ready to start a medication or consider therapy.

## 2017-07-10 ENCOUNTER — Other Ambulatory Visit: Payer: Self-pay | Admitting: Internal Medicine

## 2017-07-10 DIAGNOSIS — G459 Transient cerebral ischemic attack, unspecified: Secondary | ICD-10-CM

## 2017-07-13 ENCOUNTER — Other Ambulatory Visit: Payer: Self-pay | Admitting: Internal Medicine

## 2017-07-13 ENCOUNTER — Telehealth: Payer: Self-pay | Admitting: Internal Medicine

## 2017-07-13 DIAGNOSIS — M25519 Pain in unspecified shoulder: Secondary | ICD-10-CM

## 2017-07-13 NOTE — Telephone Encounter (Signed)
Pt came in office asking referral for orthopedic Dr.Kramer, [facility] [phone] once the referral is authorize. Pt requested ASAP needs a appointment.

## 2017-07-13 NOTE — Telephone Encounter (Signed)
Referral placed.

## 2017-07-14 ENCOUNTER — Encounter (HOSPITAL_COMMUNITY): Payer: Self-pay | Admitting: Emergency Medicine

## 2017-07-14 DIAGNOSIS — Z5321 Procedure and treatment not carried out due to patient leaving prior to being seen by health care provider: Secondary | ICD-10-CM | POA: Insufficient documentation

## 2017-07-14 DIAGNOSIS — M549 Dorsalgia, unspecified: Secondary | ICD-10-CM | POA: Insufficient documentation

## 2017-07-14 MED ORDER — IBUPROFEN 400 MG PO TABS
ORAL_TABLET | ORAL | Status: AC
  Administered 2017-07-14: 400 mg
  Filled 2017-07-14: qty 1

## 2017-07-14 NOTE — ED Notes (Signed)
Pt reports R upper back pain with radiation down R arm present since yesterday, pt reports it hurts to move R arm.

## 2017-07-15 ENCOUNTER — Emergency Department (HOSPITAL_COMMUNITY)
Admission: EM | Admit: 2017-07-15 | Discharge: 2017-07-15 | Disposition: A | Discharge status: Home / Self Care | Payer: Medicaid Other | Attending: Emergency Medicine | Admitting: Emergency Medicine

## 2017-07-15 HISTORY — DX: Unspecified convulsions: R56.9

## 2017-07-15 NOTE — ED Notes (Signed)
No answer for treatment room x1 

## 2017-07-30 ENCOUNTER — Ambulatory Visit (INDEPENDENT_AMBULATORY_CARE_PROVIDER_SITE_OTHER): Payer: Medicaid Other | Admitting: Family Medicine

## 2017-07-30 ENCOUNTER — Encounter: Payer: Self-pay | Admitting: Family Medicine

## 2017-07-30 DIAGNOSIS — R7303 Prediabetes: Secondary | ICD-10-CM

## 2017-07-30 NOTE — Progress Notes (Signed)
Medical Nutrition Therapy:  Appt start time: 1430 end time:  1530. PCP: Willadean CarolKaty Mayo, MD Husband: Mariah [name]  Assessment:  Primary concerns today: Weight management and Blood sugar control.  Mariah [name] was accompanied by her husband Mariah [name].  They share food prep at home, and he seems very interested in being supportive of dietary changes she needs to make.  Mariah [name] had many questions, and is enthusiastic to get started on a plan of both diet and exercise.    Learning Readiness: Ready  Usual eating pattern includes 2-3 meals and 0-1 snack per day. Frequent foods and beverages include water, Mal[place]key bacon, egg whites, toast, tomatoes.  Avoided foods include fast foods, liver.   Usual physical activity includes walking sporadically.  24-hr recall: (Up at 5 AM) B (6:30 AM)-   3-4 slc Mal[place]urkey bacon, 2 scrmbld egg whites, 1/2 cooked grits w/ margarine, 4 oz soda. water Snk ( AM)-   --- L ( PM)-  --- Snk (12 PM)-  6 pbutter crackers (Nabs), water D (6 PM)-  2 c chili, 1/2 c coleslaw, 2 pc cornbread, small amt margarine, water Snk (11 PM)-  1/2 c chili, 1/3 c coleslaw, 1 pc cornbread, water Typical day? No. Seldom snacks, especially at night.    Progress Towards Goal(s):  In progress.   Nutritional Diagnosis:  NB-2.1 Physical inactivity As related to poor motivation.  As evidenced by no regular exercise.    Intervention:  Nutrition education  Handouts given during visit include:  AVS  Goals Sheet  Meal planning form  Demonstrated degree of understanding via:  Teach Back   Monitoring/Evaluation:  Dietary intake, exercise, and body weight in 5 week(s).

## 2017-07-30 NOTE — Patient Instructions (Addendum)
-   You can still enjoy meats, but it is wise to avoid highly processed meats as much as possible.    - Sodium:  A daily total to stay within 1500 mg.   Goals:  1. Walk at least 30 minutes 5 times a week.   2. Eat at least 3 REAL meals and 1-2 snacks per day.  Aim for no more than 5 hours between eating.  Eat breakfast within one hour of getting up.  A REAL meal includes at least some protein, some starch, and vegetables and/or fruit.    (EAT your fruit, don't drink it!  There are a few fruits that raise blood glucose pretty easily, so just have small portions of the following: bananas, oranges, watermelon, tropical fruits - mango, papaya, pineapple.)  3. Include vegetables (no limit to quantity) at both lunch and dinner.    Vegetables are an important source of potassium.  This is at least as important to keeping your blood pressure down as is limiting sodium.   - Carbohydrate includes starch, sugar, and fiber.  Of these, only sugar and starch raise blood glucose.  (Fiber is found in fruits, vegetables [especially skin, seeds, and stocks] and whole grains.)    - It is hard to beat extra virgin olive oil, about the healthiest fat you can eat.    - TASTE PREFERENCES ARE LEARNED.  This means it will get easier to choose foods you know are good for you if you are exposed to them enough.      Obtain twice the volume of vegetables as either protein or starchy foods for both lunch and dinner.   You may also read about the DASH diet at: [employer].org/healthy-lifestyle/nutrition-and-healthy-eating/in-depth/dash-diet

## 2017-08-03 ENCOUNTER — Other Ambulatory Visit: Payer: Self-pay | Admitting: Internal Medicine

## 2017-08-16 ENCOUNTER — Encounter (HOSPITAL_COMMUNITY): Payer: Self-pay | Admitting: Emergency Medicine

## 2017-08-16 ENCOUNTER — Other Ambulatory Visit: Payer: Self-pay

## 2017-08-16 ENCOUNTER — Emergency Department (HOSPITAL_COMMUNITY)
Admission: EM | Admit: 2017-08-16 | Discharge: 2017-08-16 | Disposition: A | Discharge status: Home / Self Care | Payer: Medicaid Other | Attending: Emergency Medicine | Admitting: Emergency Medicine

## 2017-08-16 ENCOUNTER — Emergency Department (HOSPITAL_COMMUNITY): Payer: Medicaid Other

## 2017-08-16 DIAGNOSIS — I10 Essential (primary) hypertension: Secondary | ICD-10-CM | POA: Diagnosis not present

## 2017-08-16 DIAGNOSIS — Y92003 Bedroom of unspecified non-institutional (private) residence as the place of occurrence of the external cause: Secondary | ICD-10-CM | POA: Diagnosis not present

## 2017-08-16 DIAGNOSIS — X509XXA Other and unspecified overexertion or strenuous movements or postures, initial encounter: Secondary | ICD-10-CM | POA: Insufficient documentation

## 2017-08-16 DIAGNOSIS — Z8673 Personal history of transient ischemic attack (TIA), and cerebral infarction without residual deficits: Secondary | ICD-10-CM | POA: Diagnosis not present

## 2017-08-16 DIAGNOSIS — Z79899 Other long term (current) drug therapy: Secondary | ICD-10-CM | POA: Diagnosis not present

## 2017-08-16 DIAGNOSIS — S46912A Strain of unspecified muscle, fascia and tendon at shoulder and upper arm level, left arm, initial encounter: Secondary | ICD-10-CM | POA: Insufficient documentation

## 2017-08-16 DIAGNOSIS — Z7902 Long term (current) use of antithrombotics/antiplatelets: Secondary | ICD-10-CM | POA: Insufficient documentation

## 2017-08-16 DIAGNOSIS — Y9389 Activity, other specified: Secondary | ICD-10-CM | POA: Insufficient documentation

## 2017-08-16 DIAGNOSIS — Y999 Unspecified external cause status: Secondary | ICD-10-CM | POA: Insufficient documentation

## 2017-08-16 DIAGNOSIS — Z87891 Personal history of nicotine dependence: Secondary | ICD-10-CM | POA: Insufficient documentation

## 2017-08-16 DIAGNOSIS — S4992XA Unspecified injury of left shoulder and upper arm, initial encounter: Secondary | ICD-10-CM | POA: Diagnosis present

## 2017-08-16 MED ORDER — KETOROLAC TROMETHAMINE 30 MG/ML IJ SOLN
30.0000 mg | Freq: Once | INTRAMUSCULAR | Status: AC
  Administered 2017-08-16: 30 mg via INTRAMUSCULAR
  Filled 2017-08-16: qty 1

## 2017-08-16 NOTE — ED Triage Notes (Signed)
Reports laying in bed on Friday when she moved her left shoulder the wrong way and heard a pop.  Having continued pain since.

## 2017-08-16 NOTE — ED Provider Notes (Signed)
[facility] EMERGENCY DEPARTMENT Provider Note   CSN: [id number] Arrival date & time: 08/16/17  1191     History   Chief Complaint Chief Complaint  Patient presents with  . Shoulder Pain    HPI CARLINE DURA is a 56 y.o. female.  56 year old female with a history of GERD, hypertension, seizures, and degenerative disc disease presents with complaint of left shoulder pain.  She reports that the pain began 2 days prior.  She was laying in bed on Friday and she moved her arm and felt a "pop" to the left shoulder.  She is able to range her left shoulder with minimal difficulty.  She reports that the pain has been persistent and is worse with movement.  She denies other complaints.  She denies chest pain, shortness of breath, back pain, other complaint.    The history is provided by the patient.  Shoulder Pain   This is a new problem. The current episode started 2 days ago. The problem occurs hourly. The problem has been gradually improving. The pain is present in the left shoulder. The pain is mild. Exacerbated by: movement.    Past Medical History:  Diagnosis Date  . Degenerative disc disease, lumbar   . GERD (gastroesophageal reflux disease)   . Hypertension   . Mitral valve prolapse   . Seizures (HCC) 06/23/2017    Patient Active Problem List   Diagnosis Date Noted  . Episodic lightheadedness 06/30/2017  . Hyperlipidemia 06/26/2017  . TIA (transient ischemic attack) 06/23/2017  . Prediabetes 10/24/2016  . Health care maintenance 10/24/2016  . Recurrent boils 04/29/2016  . Cervical myelopathy (HCC) 12/22/2014  . 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 Degenerative disc disease, lumbar   . Mitral valve prolapse   . OBESITY 02/13/2009  . HYPERTENSION, BENIGN 08/07/2008  . Anxiety 02/26/2007  . Allergic rhinitis 02/26/2007  . GERD 02/26/2007    Past Surgical History:  Procedure Laterality Date  . ABDOMINAL HYSTERECTOMY    . ABDOMINAL HYSTERECTOMY    .  BACK SURGERY    . CHOLECYSTECTOMY    . KNEE SURGERY      OB History    Gravida Para Term Preterm AB Living   2 2           SAB TAB Ectopic Multiple Live Births                   Home Medications    Prior to Admission medications   Medication Sig Start Date End Date Taking? Authorizing Provider  ALPRAZolam Prudy Feeler) 1 MG tablet TAKE 1 TABLET BY MOUTH AT BEDTIME AS NEEDED FOR SLEEP FOR ANXIETY 06/26/17   Mayo, Allyn Kenner, MD  atenolol (TENORMIN) 50 MG tablet TAKE ONE TABLET BY MOUTH  DAILY 03/28/16   Mayo, Allyn Kenner, MD  atorvastatin (LIPITOR) 40 MG tablet Take 1 tablet (40 mg total) by mouth daily. 06/25/17   Mayo, Allyn Kenner, MD  clopidogrel (PLAVIX) 75 MG tablet Take 1 tablet (75 mg total) by mouth daily. 06/26/17   Mayo, Allyn Kenner, MD  fluticasone (FLONASE) 50 MCG/ACT nasal spray Place 2 sprays into both nostrils daily. 06/29/17   Mikell, Antionette Poles, MD  hydrochlorothiazide (HYDRODIURIL) 25 MG tablet TAKE 1 TABLET BY MOUTH ONCE DAILY 08/04/17   Mayo, Allyn Kenner, MD  oxyCODONE-acetaminophen (PERCOCET) 10-325 MG tablet Take 1 tablet by mouth every 6 (six) hours as needed. 04/02/17   [provider]    Family History No family history on file.  Social History Social History   Tobacco Use  . Smoking status: Former Smoker    Last attempt to quit: 09/16/1995    Years since quitting: 21.9  . Smokeless tobacco: Never Used  Substance Use Topics  . Alcohol use: Yes  . Drug use: Yes    Types: Marijuana     Allergies   Lisinopril; Moxifloxacin; and Sulfonamide derivatives   Review of Systems Review of Systems  Musculoskeletal:       Left shoulder pain  All other systems reviewed and are negative.    Physical Exam Updated Vital Signs BP 135/72 (BP Location: Right Arm)   Pulse (!) 57   Temp 98.1 F (36.7 C) (Oral)   Resp 20   Ht 5\' 6"  (1.676 m)   Wt 87.1 kg (192 lb)   SpO2 100%   BMI 30.99 kg/m   Physical Exam  Constitutional: She is oriented to person,  place, and time. She appears well-developed and well-nourished. No distress.  HENT:  Head: Normocephalic and atraumatic.  Mouth/Throat: Oropharynx is clear and moist.  Eyes: Conjunctivae and EOM are normal. Pupils are equal, round, and reactive to light.  Neck: Normal range of motion. Neck supple.  Cardiovascular: Normal rate and regular rhythm.  No murmur heard. Pulmonary/Chest: Effort normal and breath sounds normal. No respiratory distress.  Abdominal: Soft. Bowel sounds are normal. She exhibits no distension. There is no tenderness.  Musculoskeletal: Normal range of motion. She exhibits no edema or deformity.  Mild tenderness to left lateral shoulder   Full AROM  Distal LUE is NVI  No dislocation or deformity noted   Neurological: She is alert and oriented to person, place, and time.  Skin: Skin is warm and dry.  Psychiatric: She has a normal mood and affect.  Nursing note and vitals reviewed.    ED Treatments / Results  Labs (all labs ordered are listed, but only abnormal results are displayed) Labs Reviewed - No data to display  EKG  EKG Interpretation None       Radiology Dg Shoulder Left  Result Date: 08/16/2017 CLINICAL DATA:  Left shoulder pain after injury. EXAM: LEFT SHOULDER - 2+ VIEW COMPARISON:  None. FINDINGS: There is no evidence of fracture or dislocation. There is no evidence of arthropathy or other focal bone abnormality. Tiny soft tissue calcification in the region of the rotator cuff insertion. IMPRESSION: 1. No acute osseous abnormality. 2. Tiny soft tissue calcification in the region of the rotator cuff insertion can be seen with tendinous calcifications/tendinitis. Electronically Signed   By: Rubye OaksMelanie  Ehinger M.D.   On: 08/16/2017 06:36    Procedures Procedures (including critical care time)  Medications Ordered in ED Medications  ketorolac (TORADOL) 30 MG/ML injection 30 mg (not administered)     Initial Impression / Assessment and Plan /  ED Course  I have reviewed the triage vital signs and the nursing notes.  Pertinent labs & imaging results that were available during my care of the patient were reviewed by me and considered in my medical decision making (see chart for details).     MSE Complete  Patient with musculoskeletal tenderness to the left shoulder. No acute fracture noted. Suspect inflammation with possible element of  calcific tendonitis.  She feels improved at time of discharge.  Patient understands need for close follow-up with orthopedics.   Final Clinical Impressions(s) / ED Diagnoses   Final diagnoses:  Strain of left shoulder, initial encounter    ED Discharge Orders  None       Wynetta FinesMessick, Rayshawn Maney C, MD 08/16/17 [phone]

## 2017-08-16 NOTE — Discharge Instructions (Signed)
Please return for any problem.  °

## 2017-08-19 ENCOUNTER — Other Ambulatory Visit: Payer: Self-pay | Admitting: Internal Medicine

## 2017-08-19 NOTE — Telephone Encounter (Signed)
Please let Mariah [name] know that her prescription is at the front desk.

## 2017-08-20 NOTE — Telephone Encounter (Signed)
Patient informed.  Mariah [name],CMA  

## 2017-09-01 ENCOUNTER — Ambulatory Visit (INDEPENDENT_AMBULATORY_CARE_PROVIDER_SITE_OTHER): Payer: Medicaid Other | Admitting: Family Medicine

## 2017-09-01 ENCOUNTER — Encounter: Payer: Self-pay | Admitting: Family Medicine

## 2017-09-01 DIAGNOSIS — R7303 Prediabetes: Secondary | ICD-10-CM

## 2017-09-01 NOTE — Patient Instructions (Addendum)
The Three Questions of a Good Food Decision:  1. How hungry am I? 2. What am I in the mood for? 3. What's good for me?  This is a great time of year for homemade soups, a good way to get more vegetables.     Goals: 1. Limit starchy foods at each meal to TWO portions. One portion is equal to:  - 1 slice of bread or its equivalent.    - 1/2 cup scoopable starchy food.   - 15 grams of TOTAL CARBOHYDRATE on the food label.   2. Walk at least 30 minutes 3 times a week.   - Consistency makes this easier, e.g., same time of day and days of the week.    3. Include a vegetable at both lunch and dinner.    Complete your Goals Sheet, and bring to follow-up.

## 2017-09-01 NOTE — Progress Notes (Signed)
Medical Nutrition Therapy:  Appt start time: 0900 end time: 1000. PCP: Hyman Bible, MD Husband: Mariah [name]  Assessment:  Primary concerns today: Weight management and Blood sugar control.  Mariah [name] was again accompanied by her husband Mariah [name], who seems equally engaged in her efforts to improve her health.  They both had many questions, and continue to be enthusiastic.  [name] has made some meaningful changes, including getting more vegetables and more consistently getting three meals a day.  She has not managed to walk more, especially since the snowstorm of 12/9.    [name] and Mariah [name] will be hosting family for Christmas dinner.  We discussed menu ideas, and strategies for optimizing choices without feeling deprived.    Learning Readiness: Ready  Usual eating pattern includes 2-3 meals and 0-1 snack per day. Recent physical activity includes 30-60 min walking 1 X wk.    24-hr recall:  (Up at 5 AM) B (6:30 AM)-  2 [place] sausage patties, 2 egg whites, water Snk (8:30)-  1 c coffee, 2 tbsp flavored cream L (11:30 PM)-  3 oz chx, 1/2 mac&chs, 1/2 c broccoli, water Snk (3 PM)-  8 grapes D ( PM)-  --- Snk ( PM)-  --- Typical day? No. Usually gets three meals a day.    Progress Towards Goal(s):  In progress.   Nutritional Diagnosis:  No progress on NB-2.1 Physical inactivity As related to poor motivation.  As evidenced by no regular exercise.    Intervention:  Nutrition education  Handouts given during visit include:  AVS  Goals Sheet, revised  Demonstrated degree of understanding via:  Teach Back   Monitoring/Evaluation:  Dietary intake, exercise, and body weight in 6 week(s).

## 2017-09-04 DIAGNOSIS — G5601 Carpal tunnel syndrome, right upper limb: Secondary | ICD-10-CM | POA: Insufficient documentation

## 2017-09-22 ENCOUNTER — Other Ambulatory Visit: Payer: Self-pay | Admitting: Internal Medicine

## 2017-10-05 ENCOUNTER — Telehealth: Payer: Self-pay | Admitting: Internal Medicine

## 2017-10-05 NOTE — Telephone Encounter (Signed)
[facility]  Please advise

## 2017-10-06 ENCOUNTER — Other Ambulatory Visit: Payer: Self-pay | Admitting: Internal Medicine

## 2017-10-06 DIAGNOSIS — G8929 Other chronic pain: Secondary | ICD-10-CM

## 2017-10-06 DIAGNOSIS — M25512 Pain in left shoulder: Principal | ICD-10-CM

## 2017-10-06 NOTE — Telephone Encounter (Signed)
Referral placed.

## 2017-10-07 ENCOUNTER — Ambulatory Visit (INDEPENDENT_AMBULATORY_CARE_PROVIDER_SITE_OTHER): Payer: Medicaid Other | Admitting: Internal Medicine

## 2017-10-07 ENCOUNTER — Other Ambulatory Visit (HOSPITAL_COMMUNITY)
Admission: RE | Admit: 2017-10-07 | Discharge: 2017-10-07 | Disposition: A | Discharge status: Home / Self Care | Payer: Medicaid Other | Source: Ambulatory Visit | Attending: Family Medicine | Admitting: Family Medicine

## 2017-10-07 ENCOUNTER — Other Ambulatory Visit: Payer: Self-pay

## 2017-10-07 ENCOUNTER — Encounter: Payer: Self-pay | Admitting: Internal Medicine

## 2017-10-07 VITALS — BP 128/66 | HR 64 | Temp 97.9°F | Ht 66.0 in | Wt 197.6 lb

## 2017-10-07 DIAGNOSIS — A599 Trichomoniasis, unspecified: Secondary | ICD-10-CM | POA: Diagnosis not present

## 2017-10-07 DIAGNOSIS — N898 Other specified noninflammatory disorders of vagina: Secondary | ICD-10-CM | POA: Diagnosis not present

## 2017-10-07 LAB — POCT WET PREP (WET MOUNT): Clue Cells Wet Prep Whiff POC: NEGATIVE

## 2017-10-07 MED ORDER — METRONIDAZOLE 500 MG PO TABS: 500.0000 mg | ORAL_TABLET | Freq: Two times a day (BID) | ORAL | 0 refills | Status: DC

## 2017-10-07 NOTE — Assessment & Plan Note (Signed)
Wet prep with trichomonas. No yeast or clue cells. - Flagyl 500mg  bid x 7 days - Discussed that her sexual partner will also need to be treated. - Gonorrhea and chlamydia pending - Patient declined HIV and RPR today.

## 2017-10-07 NOTE — Patient Instructions (Addendum)
It was so nice to see you today!  Your wet prep showed trichomonas, which is a sexually transmitted disease. Please take Flagyl twice a day for 7 days. Your partner also needs to be treated and you should refrain from sexual intercourse until 1 week after both of you have finished treatment.  -Dr. Nancy MarusMayo

## 2017-10-07 NOTE — Progress Notes (Signed)
   [facility] Phone: [phone]  Subjective:  Mariah BeechamCynthia is a 57 year old female presenting to clinic with vaginal discharge for the last 1.5-2 weeks. The discharge is clear. The discharge does not have any odor. No vaginal itchiness or irritation. The discharge started after she changed to dial soap. She denies any fevers, abdominal pain, or dysuria. She is sexually active with one female partner.  ROS: See HPI for pertinent positives and negatives  Past Medical History- hx TIA with amaurosis fugax, HTN, MVP, allergic rhinitis, GERD, DDD, anxiety, HLD, obesity, prediabetes  Family history reviewed for today's visit. No changes.  Social history- patient is a former smoker, quit in 1997  Objective: BP 128/66   Pulse 64   Temp 97.9 F (36.6 C) (Oral)   Ht 5\' 6"  (1.676 m)   Wt 197 lb 9.6 oz (89.6 kg)   SpO2 99%   BMI 31.89 kg/m  Gen: NAD, alert, cooperative with exam GI: Soft, non-tender, non-distended GU: External genitalia normal in appearance, vaginal walls normal, small amount of white/gray vaginal discharge present, no cervix present, cuff normal in appearance.  Assessment/Plan: Trichomonas: Wet prep with trichomonas. No yeast or clue cells. - Flagyl 500mg  bid x 7 days - Discussed that her sexual partner will also need to be treated. - Gonorrhea and chlamydia pending - Patient declined HIV and RPR today.   Willadean CarolKaty Tajanae Guilbault, MD PGY-3

## 2017-10-08 LAB — CERVICOVAGINAL ANCILLARY ONLY
Chlamydia: NEGATIVE
Neisseria Gonorrhea: NEGATIVE

## 2017-10-13 ENCOUNTER — Telehealth: Payer: Self-pay

## 2017-10-13 ENCOUNTER — Ambulatory Visit: Payer: Self-pay | Admitting: Family Medicine

## 2017-10-13 NOTE — Telephone Encounter (Signed)
-----   Message from Campbell StallKaty Dodd Mayo, MD sent at 10/13/2017 11:48 AM EST ----- Please let patient know that her gonorrhea and chlamydia testing was negative. Thank you!

## 2017-10-13 NOTE — Telephone Encounter (Signed)
Pt contacted and informed of negative STI results. 

## 2017-10-15 ENCOUNTER — Other Ambulatory Visit: Payer: Self-pay | Admitting: Internal Medicine

## 2017-10-15 NOTE — Telephone Encounter (Signed)
[facility] [address] She would like [facility] to be the only pharmacy in her chart and [facility] can be taken out please.

## 2017-10-16 MED ORDER — ALPRAZOLAM 1 MG PO TABS: ORAL_TABLET | ORAL | 0 refills | Status: DC

## 2017-10-16 NOTE — Telephone Encounter (Signed)
Please let Ms. Fontaine know that her prescription is up front. Thanks!

## 2017-10-16 NOTE — Telephone Encounter (Signed)
Pt contacted and informed of rx up front for pick up.

## 2017-11-05 ENCOUNTER — Other Ambulatory Visit: Payer: Self-pay | Admitting: *Deleted

## 2017-11-05 MED ORDER — HYDROCHLOROTHIAZIDE 25 MG PO TABS: 25.0000 mg | ORAL_TABLET | Freq: Every day | ORAL | 0 refills | Status: DC

## 2017-11-16 ENCOUNTER — Other Ambulatory Visit: Payer: Self-pay

## 2017-11-16 MED ORDER — ATENOLOL 50 MG PO TABS: ORAL_TABLET | ORAL | 0 refills | Status: DC

## 2017-11-17 ENCOUNTER — Ambulatory Visit: Payer: Self-pay | Admitting: Family Medicine

## 2017-12-08 ENCOUNTER — Ambulatory Visit: Payer: Self-pay | Admitting: Family Medicine

## 2017-12-22 ENCOUNTER — Other Ambulatory Visit: Payer: Self-pay

## 2017-12-22 ENCOUNTER — Encounter: Payer: Self-pay | Admitting: Internal Medicine

## 2017-12-22 ENCOUNTER — Ambulatory Visit: Payer: Medicaid Other | Admitting: Internal Medicine

## 2017-12-22 VITALS — BP 132/76 | HR 63 | Temp 98.0°F | Wt 200.0 lb

## 2017-12-22 DIAGNOSIS — Z1231 Encounter for screening mammogram for malignant neoplasm of breast: Secondary | ICD-10-CM

## 2017-12-22 DIAGNOSIS — E119 Type 2 diabetes mellitus without complications: Secondary | ICD-10-CM

## 2017-12-22 DIAGNOSIS — A599 Trichomoniasis, unspecified: Secondary | ICD-10-CM

## 2017-12-22 DIAGNOSIS — N898 Other specified noninflammatory disorders of vagina: Secondary | ICD-10-CM | POA: Diagnosis not present

## 2017-12-22 DIAGNOSIS — Z0000 Encounter for general adult medical examination without abnormal findings: Secondary | ICD-10-CM | POA: Diagnosis not present

## 2017-12-22 DIAGNOSIS — R7303 Prediabetes: Secondary | ICD-10-CM

## 2017-12-22 DIAGNOSIS — R7309 Other abnormal glucose: Secondary | ICD-10-CM | POA: Diagnosis not present

## 2017-12-22 DIAGNOSIS — Z1159 Encounter for screening for other viral diseases: Secondary | ICD-10-CM | POA: Diagnosis not present

## 2017-12-22 DIAGNOSIS — E782 Mixed hyperlipidemia: Secondary | ICD-10-CM

## 2017-12-22 DIAGNOSIS — Z1239 Encounter for other screening for malignant neoplasm of breast: Secondary | ICD-10-CM

## 2017-12-22 LAB — POCT WET PREP (WET MOUNT)
Clue Cells Wet Prep Whiff POC: NEGATIVE
Trichomonas Wet Prep HPF POC: ABSENT

## 2017-12-22 LAB — POCT GLYCOSYLATED HEMOGLOBIN (HGB A1C): Hemoglobin A1C: 6.2

## 2017-12-22 MED ORDER — ALPRAZOLAM 1 MG PO TABS: ORAL_TABLET | ORAL | 0 refills | Status: DC

## 2017-12-22 MED ORDER — MELATONIN 3 MG PO CAPS: 3.0000 mg | ORAL_CAPSULE | Freq: Every evening | ORAL | 0 refills | Status: DC | PRN

## 2017-12-22 NOTE — Patient Instructions (Signed)
It was so nice to see you today!  For your cholesterol- we have rechecked your labs today. I will call you with these results.  I have ordered a mammogram. You should hear from their office to schedule this appointment  -Dr. Nancy MarusMayo

## 2017-12-23 ENCOUNTER — Other Ambulatory Visit: Payer: Self-pay | Admitting: Internal Medicine

## 2017-12-23 DIAGNOSIS — Z1231 Encounter for screening mammogram for malignant neoplasm of breast: Secondary | ICD-10-CM

## 2017-12-23 LAB — LIPID PANEL
Chol/HDL Ratio: 2.6 ratio (ref 0.0–4.4)
Cholesterol, Total: 129 mg/dL (ref 100–199)
HDL: 49 mg/dL (ref >39)
LDL Calculated: 67 mg/dL (ref 0–99)
Triglycerides: 63 mg/dL (ref 0–149)
VLDL Cholesterol Cal: 13 mg/dL (ref 5–40)

## 2017-12-23 LAB — MICROALBUMIN / CREATININE URINE RATIO
Creatinine, Urine: 52.8 mg/dL
Microalb/Creat Ratio: 7.2 mg/g creat (ref 0.0–30.0)
Microalbumin, Urine: 3.8 ug/mL

## 2017-12-23 LAB — HEPATITIS C ANTIBODY: Hep C Virus Ab: <0.1 s/co ratio (ref 0.0–0.9)

## 2017-12-24 ENCOUNTER — Telehealth: Payer: Self-pay | Admitting: Internal Medicine

## 2017-12-24 ENCOUNTER — Other Ambulatory Visit: Payer: Self-pay

## 2017-12-24 MED ORDER — CLOPIDOGREL BISULFATE 75 MG PO TABS: 75.0000 mg | ORAL_TABLET | Freq: Every day | ORAL | 0 refills | Status: DC

## 2017-12-24 NOTE — Telephone Encounter (Signed)
Called patient to discuss her lab results and had to leave a voicemail. Her urine microalbumin was completely normal, so her kidneys are not showing any signs of damage. Her bad cholesterol was 67, which looks great. She should continue taking the Lipitor. She should call us back with any questions or concerns.  Mariah CarolKaty Angela Vazguez, MD PGY-3

## 2017-12-24 NOTE — Telephone Encounter (Signed)
Pt called and said she wants to be sure her clopidogrel  is filled to [facility] by tomorrow, she said she only has enough to last until tomorrow and does not want to miss any days.

## 2017-12-25 NOTE — Assessment & Plan Note (Signed)
Last lipid panel 06/23/2017 with chol 253, HDL 54, LDL 163, TG 182. Patient subsequently started on Lipitor 40mg  daily. - Continue Lipitor 40mg  daily - Check lipid panel - Follow-up in 6 months

## 2017-12-25 NOTE — Assessment & Plan Note (Signed)
>>  ASSESSMENT AND PLAN FOR PREDIABETES WRITTEN ON 12/25/2017 12:53 PM BY MAYO, KATY DODD, MD  Well-controlled with diet.  - Check A1c - Urine microalbumin performed today - Foot exam performed today - Patient has had eye exam at Dr. Telford Nab office- will obtain records. - Follow-up in 6 months

## 2017-12-25 NOTE — Progress Notes (Signed)
   [facility] Phone: [phone]  Subjective:  Aram BeechamCynthia is a 57 year old female presenting to clinic for follow-up of her HLD, prediabetes, and hx of trichomonas.  HLD: Taking Lipitor daily. No side effects. No RUQ pain, no muscle aches.  Prediabetes: Diet controlled. Tries to eat low carb and low sugar diet. Also trying to drink more water. Does not check her blood sugars at home.  Hx of Trichomonas: Diagnosed 10/07/17. Treated with Flagyl x 7 days. Took this full course. No vaginal discharge or pelvic pain. Husband was also treated. Patient would like to be retested for trichomonas to make sure she is no longer infected.   ROS: See HPI for pertinent positives and negatives  Past Medical History- HTN, hx TIA, MVP, allergic rhinitis, DDD, anxiety, HLD, prediabetes, obesity  Family history reviewed for today's visit. No changes.  Social history- patient is a former smoker. quit in 1997  Objective: BP 132/76   Pulse 63   Temp 98 F (36.7 C) (Oral)   Wt 200 lb (90.7 kg)   SpO2 99%   BMI 32.28 kg/m  Gen: NAD, alert, cooperative with exam HEENT: NCAT, EOMI, MMM Neck: FROM, supple CV: RRR, no murmur Resp: CTABL, no wheezes, normal work of breathing GU: External genitalia normal in appearance, no genital lesions, vaginal walls normal, small amount of clear vaginal discharge present, cervix normal, no CMT Msk: No edema, warm, normal tone, moves UE/LE spontaneously Feet: No deformities, no ulcerations, no other skin breakdown bilaterally; intact to touch and monofilament testing bilaterally; PT and DP pulses intact bilaterally Neuro: Alert and oriented, no gross deficits Skin: No rashes, no lesions Psych: Appropriate behavior  Assessment/Plan: HLD: Last lipid panel 06/23/2017 with chol 253, HDL 54, LDL 163, TG 182. Patient subsequently started on Lipitor 40mg  daily. - Continue Lipitor 40mg  daily - Check lipid panel - Follow-up in 6  months  Prediabetes: Well-controlled with diet.  - Check A1c - Urine microalbumin performed today - Foot exam performed today - Patient has had eye exam at Dr. Telford Nabunn's office- will obtain records. - Follow-up in 6 months  Hx Trichomonas Infection: Tested positive 10/07/17. Both her and husband were treated with Flagyl. Wants to be retested to make sure it is gone. - Wet prep  Health Care Maintenance: - Mammogram ordered   Willadean CarolKaty Mayo, MD PGY-3

## 2017-12-25 NOTE — Assessment & Plan Note (Signed)
Well-controlled with diet.  - Check A1c - Urine microalbumin performed today - Foot exam performed today - Patient has had eye exam at Dr. Telford Nabunn's office- will obtain records. - Follow-up in 6 months

## 2017-12-25 NOTE — Assessment & Plan Note (Signed)
Mammogram ordered

## 2017-12-25 NOTE — Assessment & Plan Note (Signed)
Tested positive 10/07/17. Both her and husband were treated with Flagyl. Wants to be retested to make sure it is gone. - Wet prep

## 2018-01-18 ENCOUNTER — Ambulatory Visit
Admission: RE | Admit: 2018-01-18 | Discharge: 2018-01-18 | Disposition: A | Discharge status: Home / Self Care | Payer: Medicaid Other | Source: Ambulatory Visit | Attending: Family Medicine | Admitting: Family Medicine

## 2018-01-18 DIAGNOSIS — Z1231 Encounter for screening mammogram for malignant neoplasm of breast: Secondary | ICD-10-CM | POA: Diagnosis not present

## 2018-02-09 ENCOUNTER — Other Ambulatory Visit: Payer: Self-pay | Admitting: Internal Medicine

## 2018-02-11 ENCOUNTER — Ambulatory Visit: Payer: Medicaid Other | Admitting: Internal Medicine

## 2018-02-11 ENCOUNTER — Other Ambulatory Visit: Payer: Self-pay

## 2018-02-11 ENCOUNTER — Encounter: Payer: Self-pay | Admitting: Internal Medicine

## 2018-02-11 VITALS — BP 122/72 | HR 68 | Temp 98.7°F | Ht 66.0 in | Wt 201.0 lb

## 2018-02-11 DIAGNOSIS — L84 Corns and callosities: Secondary | ICD-10-CM | POA: Diagnosis not present

## 2018-02-11 DIAGNOSIS — L309 Dermatitis, unspecified: Secondary | ICD-10-CM | POA: Insufficient documentation

## 2018-02-11 DIAGNOSIS — K219 Gastro-esophageal reflux disease without esophagitis: Secondary | ICD-10-CM | POA: Diagnosis not present

## 2018-02-11 MED ORDER — RANITIDINE HCL 150 MG PO CAPS: 150.0000 mg | ORAL_CAPSULE | Freq: Two times a day (BID) | ORAL | 0 refills | Status: DC

## 2018-02-11 MED ORDER — TRIAMCINOLONE ACETONIDE 0.1 % EX OINT: 1.0000 "application " | TOPICAL_OINTMENT | Freq: Two times a day (BID) | CUTANEOUS | 0 refills | Status: DC

## 2018-02-11 NOTE — Progress Notes (Signed)
   [facility] Phone: [phone]  Subjective:  Mariah [name] is a 57 year old female presenting to clinic to discuss multiple issues.  GERD: Worsening recently. Has used Prilosec and Nexium in the past, which have helped. Stopped taking these meds when the heartburn improved. Has been eating a lot of tomatoes lately. Doesn't eat chocolate or drink a lot of coffee. No dysphagia, no unintentional weight loss, no chest pain.  Foot Calluses: Has been going on for years. Has a large callus on the bottom of her right foot. The callus gets very sore when she is on her feet a lot. The pain is a "stinging" and "burning". She also has a callus on the heel of her left foot. She has seen [facility] in the past for foot issue and would like to be referred back to them.  Eczema: Having dry skin patches on the extensor surfaces of both elbows. States that this always gets worse in warm/humid weather. Has been using vaseline to the elbows, which helps a little bit. The patches are itchy. Not on a steroid cream. No overlying spreading redness.  ROS: See HPI for pertinent positives and negatives  Past Medical History- HTN, hx TIA, allergic rhinitis, MVP, HLD, obesity, prediabetes, anxiety  Family history reviewed for today's visit. No changes.  Social history- patient is a former smoker  Objective: BP 122/72   Pulse 68   Temp 98.7 F (37.1 C) (Oral)   Ht  (1.676 m)   Wt 201 lb (91.2 kg)   SpO2 97%   BMI 32.44 kg/m  Gen: NAD, alert, cooperative with exam HEENT: NCAT, EOMI, MMM, oropharynx normal in appearance Neck: FROM, supple CV: RRR, no murmur Resp: CTABL, no wheezes, normal work of breathing GI: soft, non-distended, no tenderness to palpation of the epigastric area, BS present, no guarding or organomegaly Feet: large, bulky callus present on the plantar surface of the right foot; small callus on the plantar surface of the heel of the left foot.  Skin: dry  patches of skin located on the extensor surfaces of both elbows, no overlying erythema, no drainage.   Assessment/Plan: GERD: Likely worsening due to patient's diet. She has been eating a lot of tomatoes lately. Has been on PPIs in the past when her heartburn has been bad. No red flags such as dysphagia. - Will treat with Zantac - Patient given a list of foods that may trigger heartburn - Return precautions discussed - Follow-up if no improvement in the next 4 weeks  Foot Calluses: Large bulky callus present on plantar surface of the right foot and a small callus present on the plantar surface of the left foot near the heel. - Referral placed to Podiatry- patient has seen [facility] in the past  Eczema: Located on the extensor surfaces of both elbows. No signs of overlying bacterial infection. - Continue Vaseline daily - Start Triamcinolone ointment bid to dry patches - Patient requesting referral to dermatology- referral placed   Willadean Carol, MD PGY-3

## 2018-02-11 NOTE — Assessment & Plan Note (Signed)
Likely worsening due to patient's diet. She has been eating a lot of tomatoes lately. Has been on PPIs in the past when her heartburn has been bad. No red flags such as dysphagia. - Will treat with Zantac - Patient given a list of foods that may trigger heartburn - Return precautions discussed - Follow-up if no improvement in the next 4 weeks

## 2018-02-11 NOTE — Assessment & Plan Note (Signed)
Large bulky callus present on plantar surface of the right foot and a small callus present on the plantar surface of the left foot near the heel. - Referral placed to Podiatry- patient has seen [facility] in the past

## 2018-02-11 NOTE — Patient Instructions (Addendum)
It was so nice to see you today.  I have sent in referrals to the podiatrist and dermatologist. You should hear from our office in the next 2 weeks to schedule these appointments.  For your eczema- please keep using the vaseline every day. I have prescribed some Triamcinolone ointment for you to use just on your elbows.  For the reflux, I have prescribed some Zantac. You can use this twice a day as needed.   Here is a list of foods that commonly trigger reflux: -alcohol, particularly red wine -black pepper, garlic, raw onions, and other spicy foods -chocolate -citrus fruits and products, such as lemons, oranges and orange juice -coffee and caffeinated drinks, including tea and soda -peppermint -tomatoes  -Dr. Nancy Marus

## 2018-02-11 NOTE — Assessment & Plan Note (Signed)
Located on the extensor surfaces of both elbows. No signs of overlying bacterial infection. - Continue Vaseline daily - Start Triamcinolone ointment bid to dry patches - Patient requesting referral to dermatology- referral placed

## 2018-02-17 ENCOUNTER — Telehealth: Payer: Self-pay | Admitting: Internal Medicine

## 2018-02-17 NOTE — Telephone Encounter (Signed)
Pt is on Plavix and is wondering how this would affect her getting a piercing or tattoo. Please advise

## 2018-02-17 NOTE — Telephone Encounter (Signed)
Will forward to PCP 

## 2018-02-19 NOTE — Telephone Encounter (Signed)
Returned patient's call. All questions answered.  Mariah CarolKaty Deeric Cruise, MD PGY-3

## 2018-02-20 ENCOUNTER — Other Ambulatory Visit: Payer: Self-pay | Admitting: Internal Medicine

## 2018-02-24 ENCOUNTER — Ambulatory Visit: Payer: Medicaid Other | Admitting: Podiatry

## 2018-02-24 DIAGNOSIS — L989 Disorder of the skin and subcutaneous tissue, unspecified: Secondary | ICD-10-CM | POA: Diagnosis not present

## 2018-02-25 ENCOUNTER — Other Ambulatory Visit: Payer: Self-pay | Admitting: Internal Medicine

## 2018-03-06 NOTE — Progress Notes (Signed)
   Subjective: 57 year old female presenting today as a new patient with a chief complaint of painful callus lesions noted to bilateral feet that have been present for several months. Walking and bearing weight increases the pain. She has not done anything for treatment. Patient is here for further evaluation and treatment.   Past Medical History:  Diagnosis Date  . Degenerative disc disease, lumbar   . GERD (gastroesophageal reflux disease)   . Hypertension   . Mitral valve prolapse   . Seizures (HCC) 06/23/2017     Objective:  Physical Exam General: Alert and oriented x3 in no acute distress  Dermatology: Hyperkeratotic lesions present on the bilateral feet x 5. Pain on palpation with a central nucleated core noted. Skin is warm, dry and supple bilateral lower extremities. Negative for open lesions or macerations.  Vascular: Palpable pedal pulses bilaterally. No edema or erythema noted. Capillary refill within normal limits.  Neurological: Epicritic and protective threshold grossly intact bilaterally.   Musculoskeletal Exam: Pain on palpation at the keratotic lesion noted. Range of motion within normal limits bilateral. Muscle strength 5/5 in all groups bilateral.  Assessment: 1. Porokeratosis bilateral x 5    Plan of Care:  1. Patient evaluated 2. Excisional debridement of keratoic lesion using a chisel blade was performed without incident. Salinocaine applied.  3. Dressed area with light dressing. 4. Patient is to return to the clinic PRN.   Felecia ShellingBrent M. Lynze Reddy, DPM [facility]  Dr. Felecia ShellingBrent M. Kanyon Bunn, DPM    [address]                                        [place], [place] [address]                Office [phone]  Fax [phone]

## 2018-03-14 ENCOUNTER — Emergency Department (HOSPITAL_COMMUNITY)
Admission: EM | Admit: 2018-03-14 | Discharge: 2018-03-15 | Disposition: A | Discharge status: Home / Self Care | Payer: Medicaid Other | Attending: Emergency Medicine | Admitting: Emergency Medicine

## 2018-03-14 ENCOUNTER — Encounter (HOSPITAL_COMMUNITY): Payer: Self-pay | Admitting: Emergency Medicine

## 2018-03-14 DIAGNOSIS — M79651 Pain in right thigh: Secondary | ICD-10-CM | POA: Diagnosis not present

## 2018-03-14 DIAGNOSIS — W540XXA Bitten by dog, initial encounter: Secondary | ICD-10-CM | POA: Diagnosis not present

## 2018-03-14 DIAGNOSIS — Z79899 Other long term (current) drug therapy: Secondary | ICD-10-CM | POA: Diagnosis not present

## 2018-03-14 DIAGNOSIS — Y9301 Activity, walking, marching and hiking: Secondary | ICD-10-CM | POA: Diagnosis not present

## 2018-03-14 DIAGNOSIS — Y999 Unspecified external cause status: Secondary | ICD-10-CM | POA: Diagnosis not present

## 2018-03-14 DIAGNOSIS — Y929 Unspecified place or not applicable: Secondary | ICD-10-CM | POA: Diagnosis not present

## 2018-03-14 DIAGNOSIS — I10 Essential (primary) hypertension: Secondary | ICD-10-CM | POA: Insufficient documentation

## 2018-03-14 DIAGNOSIS — Z23 Encounter for immunization: Secondary | ICD-10-CM | POA: Insufficient documentation

## 2018-03-14 DIAGNOSIS — Z87891 Personal history of nicotine dependence: Secondary | ICD-10-CM | POA: Diagnosis not present

## 2018-03-14 DIAGNOSIS — Z2914 Encounter for prophylactic rabies immune globin: Secondary | ICD-10-CM | POA: Insufficient documentation

## 2018-03-14 DIAGNOSIS — Z7902 Long term (current) use of antithrombotics/antiplatelets: Secondary | ICD-10-CM | POA: Diagnosis not present

## 2018-03-14 NOTE — ED Triage Notes (Addendum)
Pt c/o large dog bite to R thigh night around 2100, pt states she was getting out of her car and unkn dog charged at her and bit her. Bandage to R thigh clean and dry

## 2018-03-15 MED ORDER — AMOXICILLIN-POT CLAVULANATE 875-125 MG PO TABS: 1.0000 | ORAL_TABLET | Freq: Two times a day (BID) | ORAL | 0 refills | Status: DC

## 2018-03-15 MED ORDER — TETANUS-DIPHTH-ACELL PERTUSSIS 5-2.5-18.5 LF-MCG/0.5 IM SUSP
0.5000 mL | Freq: Once | INTRAMUSCULAR | Status: AC
  Administered 2018-03-15: 0.5 mL via INTRAMUSCULAR
  Filled 2018-03-15: qty 0.5

## 2018-03-15 MED ORDER — RABIES VACCINE, PCEC IM SUSR
1.0000 mL | Freq: Once | INTRAMUSCULAR | Status: AC
  Administered 2018-03-15: 1 mL via INTRAMUSCULAR
  Filled 2018-03-15: qty 1

## 2018-03-15 MED ORDER — AMOXICILLIN-POT CLAVULANATE 875-125 MG PO TABS
1.0000 | ORAL_TABLET | Freq: Once | ORAL | Status: AC
  Administered 2018-03-15: 1 via ORAL
  Filled 2018-03-15: qty 1

## 2018-03-15 MED ORDER — RABIES IMMUNE GLOBULIN 150 UNIT/ML IM INJ
20.0000 [IU]/kg | INJECTION | Freq: Once | INTRAMUSCULAR | Status: AC
  Administered 2018-03-15: 1800 [IU] via INTRAMUSCULAR
  Filled 2018-03-15: qty 12

## 2018-03-15 NOTE — ED Notes (Signed)
Wound to right upper leg cleaned with ns and dry dressing placed.

## 2018-03-15 NOTE — Discharge Instructions (Signed)
Take the Augmentin as prescribed to prevent infection. You can take Tylenol for pain or soreness. Return to Short Stay for future rabies vaccinations per schedule: 2nd in the series on Day 3 - July 3 3rd on Day 7 - July 7 4th on Day 14 - July 14

## 2018-03-15 NOTE — ED Provider Notes (Signed)
[facility] EMERGENCY DEPARTMENT Provider Note   CSN: [id number] Arrival date & time: 03/14/18  2216     History   Chief Complaint Chief Complaint  Patient presents with  . Animal Bite    HPI Mariah [name] is a 57 y.o. female.  Patient presents for evaluation/treatment of dog bite to the right leg. She states she went to a friend's house and while getting out of her car a dog approached her, chased her and bit her right lateral thigh in an unprovoked attack. The dog's owner is not known though her friend has seen the dog in the neighborhood before. [employer] and Animal Control notified. No other injury.  The history is provided by the patient. No language interpreter was used.  Animal Bite    Past Medical History:  Diagnosis Date  . Degenerative disc disease, lumbar   . GERD (gastroesophageal reflux disease)   . Hypertension   . Mitral valve prolapse   . Seizures (HCC) 06/23/2017    Patient Active Problem List   Diagnosis Date Noted  . Eczema 02/11/2018  . Hyperlipidemia 06/26/2017  . TIA (transient ischemic attack) 06/23/2017  . Prediabetes 10/24/2016  . Health care maintenance 10/24/2016  . Callus of foot 04/29/2016  . Cervical myelopathy (HCC) 12/22/2014  . 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 Degenerative disc disease, lumbar   . Mitral valve prolapse   . OBESITY 02/13/2009  . HYPERTENSION, BENIGN 08/07/2008  . Anxiety 02/26/2007  . Allergic rhinitis 02/26/2007  . GERD 02/26/2007    Past Surgical History:  Procedure Laterality Date  . ABDOMINAL HYSTERECTOMY    . ABDOMINAL HYSTERECTOMY    . BACK SURGERY    . CHOLECYSTECTOMY    . KNEE SURGERY       OB History    Gravida  2   Para  2   Term      Preterm      AB      Living        SAB      TAB      Ectopic      Multiple      Live Births               Home Medications    Prior to Admission medications   Medication Sig Start Date End Date Taking? Authorizing  Provider  ALPRAZolam Prudy Feeler) 1 MG tablet TAKE 1 TABLET BY MOUTH ONCE DAILY AT BEDTIME AS NEEDED FOR ANXIETY 02/26/18   Arvilla Market, DO  atenolol (TENORMIN) 50 MG tablet TAKE 1 TABLET BY MOUTH EVERY DAY 02/22/18   Mayo, Allyn Kenner, MD  atorvastatin (LIPITOR) 40 MG tablet Take 1 tablet (40 mg total) by mouth daily. 06/25/17   Mayo, Allyn Kenner, MD  clopidogrel (PLAVIX) 75 MG tablet Take 1 tablet (75 mg total) by mouth daily. 12/24/17   Mayo, Allyn Kenner, MD  fluticasone (FLONASE) 50 MCG/ACT nasal spray Place 2 sprays into both nostrils daily. 06/29/17   Mikell, Antionette Poles, MD  hydrochlorothiazide (HYDRODIURIL) 25 MG tablet TAKE 1 TABLET BY MOUTH ONCE DAILY 02/09/18   Mayo, Allyn Kenner, MD  Melatonin 3 MG CAPS Take 1 capsule (3 mg total) by mouth at bedtime as needed. 12/22/17   Mayo, Allyn Kenner, MD  oxyCODONE-acetaminophen (PERCOCET) 10-325 MG tablet Take 1 tablet by mouth every 6 (six) hours as needed. 04/02/17   [provider]  ranitidine (ZANTAC) 150 MG capsule Take 1 capsule (150 mg total) by mouth 2 (two)  times daily. 02/11/18   Mayo, Allyn KennerKaty Dodd, MD  triamcinolone ointment (KENALOG) 0.1 % Apply 1 application topically 2 (two) times daily. 02/11/18   Mayo, Allyn KennerKaty Dodd, MD    Family History No family history on file.  Social History Social History   Tobacco Use  . Smoking status: Former Smoker    Last attempt to quit: 09/16/1995    Years since quitting: 22.5  . Smokeless tobacco: Never Used  Substance Use Topics  . Alcohol use: Yes  . Drug use: Yes    Types: Marijuana     Allergies   Lisinopril; Moxifloxacin; and Sulfonamide derivatives   Review of Systems Review of Systems  Respiratory: Negative for shortness of breath.   Cardiovascular: Negative for chest pain.  Gastrointestinal: Negative for nausea.  Musculoskeletal: Negative for myalgias.  Skin: Positive for wound.  Neurological: Negative for headaches.     Physical Exam Updated Vital Signs BP (!) 146/82 (BP  Location: Right Arm)   Pulse 63   Temp 98.2 F (36.8 C) (Oral)   Resp 17   Ht 5\' 6"  (1.676 m)   Wt 90.7 kg (200 lb)   SpO2 99%   BMI 32.28 kg/m   Physical Exam  Constitutional: She is oriented to person, place, and time. She appears well-developed and well-nourished.  Neck: Normal range of motion.  Pulmonary/Chest: Effort normal.  Neurological: She is alert and oriented to person, place, and time.  Skin: Skin is warm and dry.  Right lateral mid-thigh: wound consists of a skin flap without visualized puncture and 4 linear superficial scratch marks. Bleeding controlled. No significant swelling or discoloration.     ED Treatments / Results  Labs (all labs ordered are listed, but only abnormal results are displayed) Labs Reviewed - No data to display  EKG None  Radiology No results found.  Procedures Procedures (including critical care time)  Medications Ordered in ED Medications - No data to display   Initial Impression / Assessment and Plan / ED Course  I have reviewed the triage vital signs and the nursing notes.  Pertinent labs & imaging results that were available during my care of the patient were reviewed by me and considered in my medical decision making (see chart for details).     Patient presents after dog bite from unknown dog in unprovoked attack. Immunizations and whereabouts of the dog are unknown. [employer] and Animal Control notified by patient.  Tetanus will be updated. Rabies series started. She is advised that if the dog is found and immunizations can be proven, she can stop the series, otherwise, it is recommended that she finish. Will also provide Augmentin.   Final Clinical Impressions(s) / ED Diagnoses   Final diagnoses:  None   1. Dog bite  ED Discharge Orders    None       Elpidio AnisUpstill, Asanti Craigo, PA-C 03/15/18 0051    Nira Connardama, Pedro Eduardo, MD 03/16/18 [phone]

## 2018-03-16 DIAGNOSIS — H5213 Myopia, bilateral: Secondary | ICD-10-CM | POA: Diagnosis not present

## 2018-03-17 ENCOUNTER — Emergency Department (HOSPITAL_COMMUNITY)
Admission: EM | Admit: 2018-03-17 | Discharge: 2018-03-17 | Disposition: A | Discharge status: Home / Self Care | Payer: Medicaid Other | Attending: Emergency Medicine | Admitting: Emergency Medicine

## 2018-03-17 ENCOUNTER — Encounter (HOSPITAL_COMMUNITY): Payer: Self-pay

## 2018-03-17 ENCOUNTER — Other Ambulatory Visit: Payer: Self-pay

## 2018-03-17 DIAGNOSIS — Z4800 Encounter for change or removal of nonsurgical wound dressing: Secondary | ICD-10-CM | POA: Diagnosis not present

## 2018-03-17 DIAGNOSIS — Z23 Encounter for immunization: Secondary | ICD-10-CM | POA: Insufficient documentation

## 2018-03-17 DIAGNOSIS — Z5189 Encounter for other specified aftercare: Secondary | ICD-10-CM

## 2018-03-17 MED ORDER — RABIES VACCINE, PCEC IM SUSR
1.0000 mL | Freq: Once | INTRAMUSCULAR | Status: AC
  Administered 2018-03-17: 1 mL via INTRAMUSCULAR
  Filled 2018-03-17: qty 1

## 2018-03-17 NOTE — Discharge Instructions (Signed)
Return for 3rd vaccination on the July 7th Return for 4th vaccination on the July 14th

## 2018-03-17 NOTE — ED Triage Notes (Signed)
Patient here for rabies vaccine. States that [facility]. No complaints

## 2018-03-17 NOTE — ED Notes (Signed)
Pt reports bitten by canine to left anterolateral thigh - previously seen and given 1st set of rabies injections; presents today for second injection - verbalizes no complaints at this time; canine bite to rgt anterolateral thigh covered with band-aide PTA

## 2018-03-17 NOTE — ED Provider Notes (Signed)
[facility] EMERGENCY DEPARTMENT Provider Note   CSN: [id number] Arrival date & time: 03/17/18  4098     History   Chief Complaint Chief Complaint  Patient presents with  . Rabies Injection    HPI Mariah [name] is a 57 y.o. female who presents for the 2nd series in her rabies vaccination and for a wound check. She states that she was seen on 6/30 and advised to come back for her 2nd vaccination. She was bit on the lateral right thigh. She states the wound has been healing well and she's been taking her antibiotic as prescribed. She states animal control were able to get the dog however they are still unsure if it is rabid or not and she prefers to finish the series rather than watchful waiting.   HPI  Past Medical History:  Diagnosis Date  . Degenerative disc disease, lumbar   . GERD (gastroesophageal reflux disease)   . Hypertension   . Mitral valve prolapse   . Seizures (HCC) 06/23/2017    Patient Active Problem List   Diagnosis Date Noted  . Eczema 02/11/2018  . Hyperlipidemia 06/26/2017  . TIA (transient ischemic attack) 06/23/2017  . Prediabetes 10/24/2016  . Health care maintenance 10/24/2016  . Callus of foot 04/29/2016  . Cervical myelopathy (HCC) 12/22/2014  . 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 Degenerative disc disease, lumbar   . Mitral valve prolapse   . OBESITY 02/13/2009  . HYPERTENSION, BENIGN 08/07/2008  . Anxiety 02/26/2007  . Allergic rhinitis 02/26/2007  . GERD 02/26/2007    Past Surgical History:  Procedure Laterality Date  . ABDOMINAL HYSTERECTOMY    . ABDOMINAL HYSTERECTOMY    . BACK SURGERY    . CHOLECYSTECTOMY    . KNEE SURGERY       OB History    Gravida  2   Para  2   Term      Preterm      AB      Living        SAB      TAB      Ectopic      Multiple      Live Births               Home Medications    Prior to Admission medications   Medication Sig Start Date End Date Taking?  Authorizing Provider  ALPRAZolam Prudy Feeler) 1 MG tablet TAKE 1 TABLET BY MOUTH ONCE DAILY AT BEDTIME AS NEEDED FOR ANXIETY 02/26/18   Arvilla Market, DO  amoxicillin-clavulanate (AUGMENTIN) 875-125 MG tablet Take 1 tablet by mouth every 12 (twelve) hours. 03/15/18   Elpidio Anis, PA-C  atenolol (TENORMIN) 50 MG tablet TAKE 1 TABLET BY MOUTH EVERY DAY 02/22/18   Mayo, Allyn Kenner, MD  atorvastatin (LIPITOR) 40 MG tablet Take 1 tablet (40 mg total) by mouth daily. 06/25/17   Mayo, Allyn Kenner, MD  clopidogrel (PLAVIX) 75 MG tablet Take 1 tablet (75 mg total) by mouth daily. 12/24/17   Mayo, Allyn Kenner, MD  fluticasone (FLONASE) 50 MCG/ACT nasal spray Place 2 sprays into both nostrils daily. 06/29/17   Mikell, Antionette Poles, MD  hydrochlorothiazide (HYDRODIURIL) 25 MG tablet TAKE 1 TABLET BY MOUTH ONCE DAILY 02/09/18   Mayo, Allyn Kenner, MD  Melatonin 3 MG CAPS Take 1 capsule (3 mg total) by mouth at bedtime as needed. 12/22/17   Mayo, Allyn Kenner, MD  oxyCODONE-acetaminophen (PERCOCET) 10-325 MG tablet Take 1 tablet by mouth every 6 (  six) hours as needed. 04/02/17   [provider]  ranitidine (ZANTAC) 150 MG capsule Take 1 capsule (150 mg total) by mouth 2 (two) times daily. 02/11/18   Mayo, Allyn KennerKaty Dodd, MD  triamcinolone ointment (KENALOG) 0.1 % Apply 1 application topically 2 (two) times daily. 02/11/18   Mayo, Allyn KennerKaty Dodd, MD    Family History No family history on file.  Social History Social History   Tobacco Use  . Smoking status: Former Smoker    Last attempt to quit: 09/16/1995    Years since quitting: 22.5  . Smokeless tobacco: Never Used  Substance Use Topics  . Alcohol use: Yes  . Drug use: Yes    Types: Marijuana     Allergies   Lisinopril; Moxifloxacin; and Sulfonamide derivatives   Review of Systems Review of Systems  Constitutional: Negative for fever.  Skin: Positive for wound.     Physical Exam Updated Vital Signs BP (!) 143/67   Pulse 80   Temp 98.2 F (36.8 C)    Resp 16   SpO2 99%   Physical Exam  Constitutional: She is oriented to person, place, and time. She appears well-developed and well-nourished. No distress.  HENT:  Head: Normocephalic and atraumatic.  Eyes: Pupils are equal, round, and reactive to light. Conjunctivae are normal. Right eye exhibits no discharge. Left eye exhibits no discharge. No scleral icterus.  Neck: Normal range of motion.  Cardiovascular: Normal rate.  Pulmonary/Chest: Effort normal. No respiratory distress.  Abdominal: She exhibits no distension.  Neurological: She is alert and oriented to person, place, and time.  Skin: Skin is warm and dry.  1cm wound which appears to be healing well. There is bruising around the wound but no signs of infection  Psychiatric: She has a normal mood and affect. Her behavior is normal.  Nursing note and vitals reviewed.    ED Treatments / Results  Labs (all labs ordered are listed, but only abnormal results are displayed) Labs Reviewed - No data to display  EKG None  Radiology No results found.  Procedures Procedures (including critical care time)  Medications Ordered in ED Medications  rabies vaccine (RABAVERT) injection 1 mL (has no administration in time range)     Initial Impression / Assessment and Plan / ED Course  I have reviewed the triage vital signs and the nursing notes.  Pertinent labs & imaging results that were available during my care of the patient were reviewed by me and considered in my medical decision making (see chart for details).  57 year old female presents for her 2nd vaccine in the rabies series. She is well appearing and wound is healing appropriately. Discussed wound care and advised going to urgent care for additional vaccinations. She verbalized understanding.  Final Clinical Impressions(s) / ED Diagnoses   Final diagnoses:  Need for rabies vaccination  Visit for wound check    ED Discharge Orders    None       Bethel BornGekas,  Christabelle Hanzlik Marie, PA-C 03/17/18 [id number]    Mancel BaleWentz, Elliott, MD 03/18/18 [phone]

## 2018-03-19 DIAGNOSIS — L853 Xerosis cutis: Secondary | ICD-10-CM | POA: Diagnosis not present

## 2018-03-19 DIAGNOSIS — L708 Other acne: Secondary | ICD-10-CM | POA: Diagnosis not present

## 2018-03-19 DIAGNOSIS — D224 Melanocytic nevi of scalp and neck: Secondary | ICD-10-CM | POA: Diagnosis not present

## 2018-03-21 ENCOUNTER — Encounter (HOSPITAL_COMMUNITY): Payer: Self-pay | Admitting: Emergency Medicine

## 2018-03-21 ENCOUNTER — Ambulatory Visit (HOSPITAL_COMMUNITY)
Admission: EM | Admit: 2018-03-21 | Discharge: 2018-03-21 | Disposition: A | Discharge status: Home / Self Care | Payer: Medicaid Other | Attending: Internal Medicine | Admitting: Internal Medicine

## 2018-03-21 ENCOUNTER — Other Ambulatory Visit: Payer: Self-pay

## 2018-03-21 DIAGNOSIS — Z23 Encounter for immunization: Secondary | ICD-10-CM | POA: Diagnosis not present

## 2018-03-21 DIAGNOSIS — W540XXA Bitten by dog, initial encounter: Secondary | ICD-10-CM

## 2018-03-21 DIAGNOSIS — Z203 Contact with and (suspected) exposure to rabies: Secondary | ICD-10-CM | POA: Diagnosis not present

## 2018-03-21 MED ORDER — RABIES VACCINE, PCEC IM SUSR
1.0000 mL | Freq: Once | INTRAMUSCULAR | Status: AC
  Administered 2018-03-21: 1 mL via INTRAMUSCULAR

## 2018-03-21 MED ORDER — RABIES VACCINE, PCEC IM SUSR
INTRAMUSCULAR | Status: AC
  Filled 2018-03-21: qty 1

## 2018-03-21 NOTE — ED Notes (Signed)
Bed: UC01 Expected date:  Expected time:  Means of arrival:  Comments: 

## 2018-03-21 NOTE — ED Triage Notes (Signed)
The patient presented to [facility] to receive the Day 7 rabies booster vaccination. 

## 2018-03-21 NOTE — Discharge Instructions (Signed)
[facility] on 03/28/2018 for your Day 14 rabies booster vaccination.  Rabies Rabies is an infection that affects the brain and central nervous system. It is caused by a virus that can be carried by many kinds of animals. The virus can spread from an infected animal to a person through a bite. If you have been bitten by an animal with rabies, it is very important that you get treatment right away. The infection almost always results in death, but early treatment is effective at preventing the infection from developing. What are the causes? This condition is caused by a virus that can be carried by many kinds of animals, including dogs, cats, skunks, bats, woodchucks, raccoons, coyotes, and foxes. The virus spreads through the saliva of infected animals. Most people who get rabies get it from an animal bite. What are the signs or symptoms? Symptoms of this condition usually start 1-3 months after you are bitten. By the time symptoms start, it is usually too late for lifesaving treatment. Common symptoms include:  Headache.  Fever.  Fatigue and weakness.  Agitation.  Anxiety.  Confusion.  Unusual behavior, such as hyperactivity, fear of water (hydrophobia), or fear of air (aerophobia).  Hallucinations.  Insomnia.  Weakness in the arms or legs.  Difficulty swallowing.  Most people who are treated right away will never have symptoms. How is this diagnosed? This condition is diagnosed based on symptoms. There is no way to reliably diagnose rabies before symptoms start, but your health care provider may order rabies tests. To test for rabies, your health care provider may take a sample of one or more of the following:  Saliva.  Blood.  Skin. Skin samples are taken with a needle.  Spinal fluid. Spinal fluid samples are taken with a needle that is inserted into your back.  How is this treated? Treatment is often started right away, even if it is not  known for sure if the animal that bit you has rabies. Treatment, called post-exposure prophylaxis (PEP), aims to prevent the infection from developing. It involves:  Cleaning the wound.  Injections of a rabies immune globulin.  A series of rabies vaccine injections usually given over a 2-week period.  If the animal that bit you has been caught and is alive, it will be watched to see if it remains healthy. If the animal has been killed, it can be tested for rabies. Follow these instructions at home:  Keep all follow-up visits as told by your health care provider. This is important.  If the animal that bit you was tested for rabies, ask your health care provider or the department performing the test when the test results will be ready. It is your responsibility to get the test results.  Follow instructions from your health care provider about how to take care of your wound. Make sure you: ? Wash your hands with soap and water before you change your bandage (dressing). If soap and water are not available, use hand sanitizer. ? Change your dressing as told by your health care provider. ? Keep the wound dry for as long as told by your health care provider.  Keep the wound raised (elevated) above the level of your heart as much as possible.  Rest the injured area. Do not use the injured area until your health care provider says it is okay.  Take over-the-counter and prescription medicines only as told by your health care provider. How is this prevented?  Stay  away from stray or wild animals.  If you are planning to travel to an area where rabies is common, or if your job or hobbies involve possible contact with wild or stray animals, consider getting the rabies vaccine.  Make sure your pet stays up to date with rabies vaccinations.  Watch your pets when they are outside. Keep them away from wild animals.  Report any stray animals to the local animal control services. This information is  not intended to replace advice given to you by your health care provider. Make sure you discuss any questions you have with your health care provider. Document Released: 09/01/2005 Document Revised: 02/07/2016 Document Reviewed: 06/30/2015 Elsevier Interactive Patient Education  2018 [employer].  Rabies Vaccine: What You Need to Know What is rabies?  Rabies is a serious disease. It is caused by a virus.  Rabies is mainly a disease of animals. Humans get rabies when they are bitten by infected animals.  At first there might not be any symptoms. But weeks, or even years after a bite, rabies can cause pain, fatigue, headaches, fever, and irritability. These are followed by seizures, hallucinations, and paralysis. Human rabies is almost always fatal.  Wild animals, especially bats, are the most common source of human rabies infection in the [place]. Skunks, raccoons, dogs, cats, coyotes, foxes, and other mammals can also transmit the disease.  Human rabies is rare in the [place]. There have been only 55 cases diagnosed since 1990. However, between 16,000 and 39,000 people are vaccinated each year as a precaution after animal bites. Also, rabies is far more common in other parts of the world, with about 40,000 to 70,000 rabies-related deaths worldwide each year. Bites from unvaccinated dogs cause most of these cases. Rabies vaccine can prevent rabies. Rabies vaccine  Rabies vaccine is given to people at high risk of rabies to protect them if they are exposed. It can also prevent the disease if it is given to a person after they have been exposed.  Rabies vaccine is made from killed rabies virus. It cannot cause rabies. Who should get rabies vaccine and when? Preventive vaccination (no exposure)  People at high risk of exposure to rabies, such as veterinarians, Educational psychologist, rabies laboratory workers, spelunkers, and rabies biologics production workers should be offered rabies  vaccine.  The vaccine should also be considered for: ? People whose activities bring them into frequent contact with rabies virus or with possibly rabid animals. ? International travelers who are likely to come in contact with animals in parts of the world where rabies is common.  The pre-exposure schedule for rabies vaccination is 3 doses, given at the following times: ? Dose 1: As appropriate. ? Dose 2: 7 days after Dose 1. ? Dose 3: 21 days or 28 days after Dose 1.  For laboratory workers and others who may be repeatedly exposed to rabies virus, periodic testing for immunity is recommended and booster doses should be given as needed. (Testing or booster doses are not recommended for travelers). Ask your doctor for details. Vaccination after an exposure Anyone who has been bitten by an animal, or who otherwise may have been exposed to rabies, should clean the wound and see a doctor immediately. The doctor will determine if they need to be vaccinated. A person who is exposed and has never been vaccinated against rabies should get 4 doses of rabies vaccine: one dose right away and additional doses on the 3rd, 7th, and 14th days. They should  also get another shot called Rabies Immune Globulin at the same time as the first dose. A person who has been previously vaccinated should get 2 doses of rabies vaccine: one right away and another on the 3rd day. Rabies Immune Globulin is not needed. Tell a health care provider about: Talk with a doctor before getting rabies vaccine if you:  Ever had a serious (life-threatening) allergic reaction to a previous dose of rabies vaccine or to any component of the vaccine; tell your doctor if you have any severe allergies.  Have a weakened immune system because of: ? HIV, AIDS, or another disease that affects the immune system. ? Treatment with drugs that affect the immune system, such as steroids. ? Cancer or cancer treatment with radiation or drugs.  If you  have a minor illness, such as a cold, you can be vaccinated. If you are moderately or severely ill, you should probably wait until you recover before getting a routine (non-exposure) dose of rabies vaccine. If you have been exposed to rabies virus, you should get the vaccine regardless of any other illnesses you may have. What are the risks from rabies vaccine? A vaccine, like any medicine, is capable of causing serious problems, such as severe allergic reactions. The risk of a vaccine causing serious harm, or death, is extremely small. Serious problems from rabies vaccine are very rare. Mild problems:  Soreness, redness, swelling, or itching where the shot was given (30% to 74%).  Headache, nausea, abdominal pain, muscle aches, or dizziness (5% to 40%). Moderate problems:  Hives, pain in the joints, or fever (about 6% of booster doses).  Other nervous system disorders, such as Guillain-Barr Syndrome (GBS), have been reported after rabies vaccine, but this happens so rarely that it is not known whether they are related to the vaccine. NOTE: Several brands of rabies vaccine are available in the [place], and reactions may vary between brands. Your provider can give you more information about a particular brand. What if there is a serious reaction? What should I look for? Look for anything that concerns you, such as signs of a severe allergic reaction, very high fever, or behavior changes. Signs of a severe allergic reaction can include hives, swelling of the face and throat, difficulty breathing, a fast heartbeat, dizziness, and weakness. These would start a few minutes to a few hours after the vaccination. What should I do?  If you think it is a severe allergic reaction or other emergency that cannot wait, call 9-1-1 or get the person to the nearest hospital. Otherwise, call your doctor.  Afterward, the reaction should be reported to the Vaccine Adverse Event Reporting System (VAERS).  Your doctor might file this report, or you can do it yourself through the VAERS website at www.vaers.LAgents.no or by calling 1-(437) 752-4521. ? VAERS is only for reporting reactions. They do not give medical advice. How can I learn more?  Ask your doctor.  Call your local or state health department.  Contact the Centers for Disease Control and Prevention (CDC): ? Visit the CDC rabies website at wwwcrv.com CDC Rabies Vaccine VIS (06/20/08) This information is not intended to replace advice given to you by your health care provider. Make sure you discuss any questions you have with your health care provider. Document Released: 06/29/2006 Document Revised: 05/22/2016 Document Reviewed: 05/22/2016 Elsevier Interactive Patient Education  2017 [employer].

## 2018-03-29 ENCOUNTER — Other Ambulatory Visit: Payer: Self-pay | Admitting: *Deleted

## 2018-03-29 MED ORDER — CLOPIDOGREL BISULFATE 75 MG PO TABS: 75.0000 mg | ORAL_TABLET | Freq: Every day | ORAL | 1 refills | Status: DC

## 2018-03-29 MED ORDER — RANITIDINE HCL 150 MG PO CAPS: 150.0000 mg | ORAL_CAPSULE | Freq: Two times a day (BID) | ORAL | 1 refills | Status: DC

## 2018-04-01 DIAGNOSIS — G5601 Carpal tunnel syndrome, right upper limb: Secondary | ICD-10-CM | POA: Diagnosis not present

## 2018-04-01 DIAGNOSIS — G959 Disease of spinal cord, unspecified: Secondary | ICD-10-CM | POA: Diagnosis not present

## 2018-04-01 DIAGNOSIS — Z981 Arthrodesis status: Secondary | ICD-10-CM | POA: Diagnosis not present

## 2018-04-15 DIAGNOSIS — H524 Presbyopia: Secondary | ICD-10-CM | POA: Diagnosis not present

## 2018-04-19 DIAGNOSIS — M5136 Other intervertebral disc degeneration, lumbar region: Secondary | ICD-10-CM | POA: Diagnosis not present

## 2018-05-03 ENCOUNTER — Telehealth: Payer: Self-pay | Admitting: Family Medicine

## 2018-05-03 DIAGNOSIS — M545 Low back pain: Secondary | ICD-10-CM | POA: Diagnosis not present

## 2018-05-03 NOTE — Telephone Encounter (Signed)
Pt came in office requesting a medication refill on her ALPRAZolam. Stated pharmacy already sent the request last week.

## 2018-05-03 NOTE — Telephone Encounter (Signed)
Will forward to MD.  The last request we received was 03-29-18. Xzandria Clevinger,CMA

## 2018-05-04 NOTE — Telephone Encounter (Signed)
2nd request. Coral Timme,CMA  

## 2018-05-04 NOTE — Telephone Encounter (Signed)
Patient will need to be seen by new PCP prior to giving refills to determine optimal regimen for her anxiety.

## 2018-05-05 ENCOUNTER — Telehealth: Payer: Self-pay | Admitting: Family Medicine

## 2018-05-05 NOTE — Telephone Encounter (Signed)
Attempted to contact pt to schedule sooner. However, the call went to straight to VM. I left a message for her to call me back.

## 2018-05-05 NOTE — Telephone Encounter (Signed)
Patient has an appointment to see you but its not until October which is the next time you had open.  [name],CMA

## 2018-05-05 NOTE — Telephone Encounter (Signed)
Pt was calling to check on her refill. I told her she would have to be seen by PCP before being able to have it filled. She is worried about being off of the medication for a month before she can be seen. She would like to know if there is something she can do about the medication until seeing Dr. Linwood Dibblesumball. Pt would like to be contacted so she knows what can be done.

## 2018-05-06 NOTE — Telephone Encounter (Signed)
Scheduled apt for 9/5. Pt would like enough to last her until then. Please advise. If you would like to speak with her [phone].

## 2018-05-07 MED ORDER — ALPRAZOLAM 1 MG PO TABS: 1.0000 mg | ORAL_TABLET | Freq: Every day | ORAL | 0 refills | Status: DC | PRN

## 2018-05-07 NOTE — Telephone Encounter (Signed)
Pt called back and would like to know if she will be able to have answer before the weekend on whether or not she will be able to get enough medication to last her until her appointment on 09/05. Pt would like to know as soon as possible.

## 2018-05-07 NOTE — Addendum Note (Signed)
Addended by: Caro LarocheUMBALL, ALISON M on: 05/07/2018 11:09 PM   Modules accepted: Orders

## 2018-05-07 NOTE — Telephone Encounter (Signed)
Will check with MD.  Mariah [name],CMA  

## 2018-05-07 NOTE — Telephone Encounter (Signed)
Gave enough to last until appointment 9/5. Please let patient know.

## 2018-05-14 ENCOUNTER — Other Ambulatory Visit: Payer: Self-pay | Admitting: Family Medicine

## 2018-05-14 MED ORDER — HYDROCHLOROTHIAZIDE 25 MG PO TABS: 25.0000 mg | ORAL_TABLET | Freq: Every day | ORAL | 0 refills | Status: DC

## 2018-05-14 NOTE — Telephone Encounter (Signed)
Pt called and has an appointment with Dr. Linwood Dibblesumball on 05/20/18 but she is completley out of her Hydrodiuril 25mg  which she takes daily. She has not had it in two days and would like to know if she can have some called in to last her until that appointment.

## 2018-05-14 NOTE — Telephone Encounter (Signed)
Pt would like to be contacted to know if this is possible or when the medication has been sent to the pharmacy. The best number is [phone]

## 2018-05-14 NOTE — Telephone Encounter (Signed)
Patient informed.  Nychelle Cassata,CMA  

## 2018-05-18 NOTE — Progress Notes (Signed)
  Subjective:   Patient ID: Mariah [name]    DOB: 14-Jun-1961, 57 y.o. female   MRN: [id number]  Mariah [name] is a 57 y.o. female with a history of HTN, MVP, GERD, DDD, obesity, anxiety, prediabetes here for   Insomnia - Takes Xanax 1mg  daily PRN, taking about once per week. States she takes the Xanax mostly for insomnia rather than anxiety. Does not report much difficulty with anxiety. - has previously tried melatonin 3mg  but did not help - has never had sleep study. Husband reports snoring but doesn't know if she stops breathing during the night. Does report that sometimes she slows her breathing. - wakes up frequently through the night sometimes when she has to urinate or when she notices increased sinus drainage. Saline mist spray and flonase has helped with this. - has not tried any other prescription medications for insomnia - does not report daytime sleepiness. - no set bedtime routine. Occasionally watches TV before bed.  Hypertension: - Medications: atenolol 50mg  daily, HCTZ 25mg  daily - Compliance: good - Checking BP at home: no - Denies any SOB, CP, vision changes, LE edema, medication SEs, or symptoms of hypotension  Review of Systems:  Per HPI.  PMFSH, medications and smoking status reviewed.  Objective:   BP 130/65   Pulse 67   Temp 98.2 F (36.8 C)   Wt 203 lb 3.2 oz (92.2 kg)   SpO2 97%   BMI 32.80 kg/m  Vitals and nursing note reviewed.  General: overweight female, in no acute distress with non-toxic appearance CV: regular rate and rhythm without murmurs, rubs, or gallops, no lower extremity edema Lungs: clear to auscultation bilaterally with normal work of breathing Skin: warm, dry, no rashes or lesions Extremities: warm and well perfused, normal tone MSK: ROM grossly intact, strength intact, gait normal Neuro: Alert and oriented, speech normal  GAD 7 : Generalized Anxiety Score 05/20/2018 07/08/2017  Nervous, Anxious, on Edge 0 1  Control/stop  worrying 1 0  Worry too much - different things 1 1  Trouble relaxing 0 1  Restless 1 0  Easily annoyed or irritable 0 2  Afraid - awful might happen 0 2  Total GAD 7 Score 3 7  Anxiety Difficulty Somewhat difficult Somewhat difficult    Assessment & Plan:   HYPERTENSION, BENIGN Well controlled on current regimen. No changes today.  Insomnia Mainly difficulty initiating sleep. Discussed establishing bedtime routine, sleep hygiene and limiting screen time prior to bed. Suggested sleep study given history of snoring and hypertension however patient declined. Explained to patient Xanax is not appropriate treatment for insomnia given her anxiety is better controlled from previous. Patient is very resistant to changing current medication regimen even after explaining danger of use with percocet for her knee pain prescribed by orthopedics. She currently has 9 pills of Xanax left. Explained to patient the likelihood of tapering down her Xanax. Believe with establishing adequate sleep hygiene and SL melatonin, will be able to establish good sleeping patterns without the use of Xanax.  No orders of the defined types were placed in this encounter.  Meds ordered this encounter  Medications  . Melatonin 3 MG SUBL    Sig: Place 3 mg under the tongue at bedtime as needed.    Dispense:  90 tablet    Refill:  1    Ellwood Dense, DO PGY-2, [facility] 05/20/2018 7:26 PM

## 2018-05-20 ENCOUNTER — Encounter: Payer: Self-pay | Admitting: Family Medicine

## 2018-05-20 ENCOUNTER — Ambulatory Visit: Payer: Medicaid Other | Admitting: Family Medicine

## 2018-05-20 VITALS — BP 130/65 | HR 67 | Temp 98.2°F | Wt 203.2 lb

## 2018-05-20 DIAGNOSIS — G4700 Insomnia, unspecified: Secondary | ICD-10-CM

## 2018-05-20 DIAGNOSIS — I10 Essential (primary) hypertension: Secondary | ICD-10-CM | POA: Diagnosis not present

## 2018-05-20 MED ORDER — MELATONIN 3 MG SL SUBL: 3.0000 mg | SUBLINGUAL_TABLET | Freq: Every evening | SUBLINGUAL | 1 refills | Status: DC | PRN

## 2018-05-20 NOTE — Assessment & Plan Note (Addendum)
Mainly difficulty initiating sleep. Discussed establishing bedtime routine, sleep hygiene and limiting screen time prior to bed. Suggested sleep study given history of snoring and hypertension however patient declined. Explained to patient Xanax is not appropriate treatment for insomnia given her anxiety is better controlled from previous. Patient is very resistant to changing current medication regimen even after explaining danger of use with percocet for her knee pain prescribed by orthopedics. She currently has 9 pills of Xanax left. Explained to patient the likelihood of tapering down her Xanax. Believe with establishing adequate sleep hygiene and SL melatonin, will be able to establish good sleeping patterns without the use of Xanax.

## 2018-05-20 NOTE — Assessment & Plan Note (Signed)
>>  ASSESSMENT AND PLAN FOR HYPERTENSION, BENIGN WRITTEN ON 05/20/2018  4:03 PM BY RUMBALL, ALISON, DO  Well controlled on current regimen. No changes today.

## 2018-05-20 NOTE — Patient Instructions (Signed)
It was great to see you!  Our plans for today:  - We will prescribe the sublingual form of melatonin to see if that helps with your sleep. - Should you change your mind about the sleep study, let us know. - We will eventually need to taper down your Xanax to be the safest regimen for you. - No changes in your blood pressure regimen today.  Take care and seek immediate care sooner if you develop any concerns.   Dr. [facility]

## 2018-05-20 NOTE — Assessment & Plan Note (Signed)
Well-controlled on current regimen. No changes today. 

## 2018-05-28 ENCOUNTER — Other Ambulatory Visit: Payer: Self-pay

## 2018-05-28 MED ORDER — ATENOLOL 50 MG PO TABS: ORAL_TABLET | ORAL | 3 refills | Status: DC

## 2018-05-31 ENCOUNTER — Other Ambulatory Visit: Payer: Self-pay | Admitting: Family Medicine

## 2018-06-16 ENCOUNTER — Ambulatory Visit: Payer: Self-pay | Admitting: Family Medicine

## 2018-06-21 ENCOUNTER — Telehealth: Payer: Self-pay | Admitting: Family Medicine

## 2018-06-21 NOTE — Telephone Encounter (Signed)
Will forward to MD. [name],CMA  

## 2018-06-21 NOTE — Telephone Encounter (Signed)
Patient needs referral to [facility] care, as he has an appointment tomorrow, 06/22/18.  Please call Lequita Halt with any questions, [phone].

## 2018-06-21 NOTE — Telephone Encounter (Signed)
Pt has an eye appointment at [facility] and needs a referral to be seen since she has Medicaid. Please put in today since her appointment is tomorrow jw

## 2018-06-22 ENCOUNTER — Other Ambulatory Visit: Payer: Self-pay | Admitting: Family Medicine

## 2018-06-22 DIAGNOSIS — H25013 Cortical age-related cataract, bilateral: Secondary | ICD-10-CM | POA: Diagnosis not present

## 2018-06-22 DIAGNOSIS — R739 Hyperglycemia, unspecified: Secondary | ICD-10-CM | POA: Diagnosis not present

## 2018-06-22 DIAGNOSIS — H40013 Open angle with borderline findings, low risk, bilateral: Secondary | ICD-10-CM | POA: Diagnosis not present

## 2018-06-22 DIAGNOSIS — Z0100 Encounter for examination of eyes and vision without abnormal findings: Secondary | ICD-10-CM

## 2018-06-22 NOTE — Telephone Encounter (Signed)
Order placed

## 2018-06-22 NOTE — Telephone Encounter (Signed)
Will forward to [place].  Chirstine Defrain,CMA

## 2018-06-25 DIAGNOSIS — M4316 Spondylolisthesis, lumbar region: Secondary | ICD-10-CM | POA: Diagnosis not present

## 2018-06-25 DIAGNOSIS — Z6832 Body mass index (BMI) 32.0-32.9, adult: Secondary | ICD-10-CM | POA: Diagnosis not present

## 2018-06-25 DIAGNOSIS — I10 Essential (primary) hypertension: Secondary | ICD-10-CM | POA: Diagnosis not present

## 2018-06-25 DIAGNOSIS — M48062 Spinal stenosis, lumbar region with neurogenic claudication: Secondary | ICD-10-CM | POA: Diagnosis not present

## 2018-07-08 ENCOUNTER — Other Ambulatory Visit: Payer: Self-pay | Admitting: Family Medicine

## 2018-07-08 ENCOUNTER — Telehealth: Payer: Self-pay | Admitting: Family Medicine

## 2018-07-08 DIAGNOSIS — M5136 Other intervertebral disc degeneration, lumbar region: Secondary | ICD-10-CM

## 2018-07-08 DIAGNOSIS — M546 Pain in thoracic spine: Secondary | ICD-10-CM | POA: Diagnosis not present

## 2018-07-08 NOTE — Telephone Encounter (Signed)
Will forward to MD to place referral. Riku Buttery,CMA  

## 2018-07-08 NOTE — Telephone Encounter (Signed)
Pt came in office requesting a referral to Dr.Kramer for Orthopedic, best phone # to contact is [phone].

## 2018-07-08 NOTE — Telephone Encounter (Signed)
Referral placed.

## 2018-07-19 ENCOUNTER — Ambulatory Visit: Payer: Medicaid Other | Attending: Neurosurgery | Admitting: Physical Therapy

## 2018-07-19 DIAGNOSIS — G8929 Other chronic pain: Secondary | ICD-10-CM | POA: Insufficient documentation

## 2018-07-19 DIAGNOSIS — M545 Low back pain, unspecified: Secondary | ICD-10-CM

## 2018-07-19 NOTE — Patient Instructions (Signed)
Access Code: [email or web address]  URL: [email or web address]  Date: 07/19/2018  Prepared by: Ivery Quale   Exercises  Supine Hamstring Stretch with Strap - 3 reps - 1 sets - 30 hold - 2x daily - 6x weekly  Supine Single Knee to Chest - 3 sets - 30 hold - 2x daily - 6x weekly  Supine Double Knee to Chest - 3 reps - 1 sets - 30 hold - 2x daily - 6x weekly  Supine March - 10 reps - 1-2 sets - 2x daily - 6x weekly  Seated Lumbar Flexion Stretch - 10-20 reps - 1 sets - 5 hold - 2x daily - 6x weekly

## 2018-07-19 NOTE — Therapy (Signed)
[facility] [address] [place], [place], [address] Phone: [phone]   Fax:  [phone]  Physical Therapy Evaluation  Patient Details  Name: Mariah [name] MRN: [id number] Date of Birth: 1960-10-08 Referring Provider (PT): Daiva Eves Date: 07/19/2018  PT End of Session - 07/19/18 0855    Visit Number  1    Number of Visits  12    Date for PT Re-Evaluation  09/13/18    Authorization Type  MCD    PT Start Time  0807    PT Stop Time  0848    PT Time Calculation (min)  41 min    Activity Tolerance  Patient tolerated treatment well    Behavior During Therapy  [facility] for tasks assessed/performed       Past Medical History:  Diagnosis Date  . Degenerative disc disease, lumbar   . GERD (gastroesophageal reflux disease)   . Hypertension   . Mitral valve prolapse   . Seizures (HCC) 06/23/2017    Past Surgical History:  Procedure Laterality Date  . ABDOMINAL HYSTERECTOMY    . ABDOMINAL HYSTERECTOMY    . BACK SURGERY    . CHOLECYSTECTOMY    . KNEE SURGERY      There were no vitals filed for this visit.   Subjective Assessment - 07/19/18 0815    Subjective  Pt reports back and pain and radiating pain into both hips. She has had L5-S1 back surgery but she does not know what kind other than it was not a fusion. She has also had 3-4 neck surgeries including Fusion of C3-5. She denies leg radiculopathy and pain in more localized to hips.     Pertinent History  ONG:EXBMWUXL myelopathy, TIA,GERD, HTN,mitral valve prolapse, seizures, prev back surgery, prev knee surgery    Limitations  Sitting;Standing;Walking;House hold activities    How long can you stand comfortably?  15 min    How long can you walk comfortably?  10-15 min    Diagnostic tests  MRI and x-rays show stenosis and DDD per pt report    Patient Stated Goals  minimize her symptoms    Currently in Pain?  Yes    Pain Score  8     Pain Location  Back    lumbar-thoracic and into hips   Pain Orientation  Right;Left    Pain Descriptors / Indicators  Aching    Pain Type  Chronic pain    Pain Radiating Towards  both posterior hips    Pain Onset  More than a month ago    Pain Frequency  Intermittent    Aggravating Factors   walking, standing, sitting upright    Pain Relieving Factors  meds, heat         [facility] PT Assessment - 07/19/18 0001      Assessment   Medical Diagnosis  lumbar-thoraic pain, stenosis/DDD    Referring Provider (PT)  Lovell Sheehan    Onset Date/Surgical Date  --   chronic   Next MD Visit  Jan    Prior Therapy  --   PT a long time ago     Balance Screen   Has the patient fallen in the past 6 months  No      Home Environment   Living Environment  Private residence    Living Arrangements  Spouse/significant other    Additional Comments  no stairs inside, 3 steps to enter but she can manage this she says  Prior Function   Level of Independence  Independent with basic ADLs      Observation/Other Assessments   Focus on Therapeutic Outcomes (FOTO)   not done, MCD      Sensation   Light Touch  Appears Intact      Posture/Postural Control   Posture Comments  slumped posture      ROM / Strength   AROM / PROM / Strength  AROM;Strength      AROM   Overall AROM Comments  % available    AROM Assessment Site  Lumbar    Lumbar Flexion  75%    Lumbar Extension  50%    Lumbar - Right Side Bend  75%    Lumbar - Left Side Bend  75%    Lumbar - Right Rotation  75%    Lumbar - Left Rotation  75%      Strength   Strength Assessment Site  Hip;Knee    Right/Left Hip  Right;Left    Right Hip Flexion  4/5    Right Hip ABduction  4/5    Left Hip Flexion  4-/5    Left Hip ABduction  4-/5    Right/Left Knee  Right;Left    Right Knee Flexion  5/5    Right Knee Extension  5/5    Left Knee Flexion  4+/5    Left Knee Extension  4+/5      Palpation   Palpation comment  TTP lumbar-thoracic paraspinals and glutes       Special Tests   Other special tests  neg SLR      Ambulation/Gait   Gait Comments  shortened strides, decreased velocity                Objective measurements completed on examination: See above findings.      [facility] Adult PT Treatment/Exercise - 07/19/18 0001      Exercises   Exercises  Lumbar      Lumbar Exercises: Stretches   Single Knee to Chest Stretch  Right;Left;1 rep;30 seconds    Double Knee to Chest Stretch  1 rep;30 seconds      Modalities   Modalities  Moist Heat      Moist Heat Therapy   Number Minutes Moist Heat  15 Minutes    Moist Heat Location  Lumbar Spine   supine, feet elevated on tall bolster            PT Education - 07/19/18 0854    Education Details  HEP, POC    Person(s) Educated  Patient    Methods  Explanation;Demonstration;Verbal cues;Handout    Comprehension  Verbalized understanding;Need further instruction       PT Short Term Goals - 07/19/18 0901      PT SHORT TERM GOAL #1   Title  Pt will be I and compliant with initial HEP. 3 weeks 08/09/18    Baseline  no HEP until today      PT SHORT TERM GOAL #2   Title  Pt will report decreased pain from 8/10 average to less than 7/10 average. 3 weeks 08/09/18    Baseline  8/10        PT Long Term Goals - 07/19/18 0909      PT LONG TERM GOAL #1   Title  Pt will be I and compliant with final HEP. 8 weeks 09/13/18    Baseline  no HEP until today    Status  New  PT LONG TERM GOAL #2   Title  Pt will increase hip strength to at least 4+/5 MMT to increase function. 8 weeks 09/13/18    Baseline  4- to 4    Status  New      PT LONG TERM GOAL #3   Title  Pt will relay less than 3-4/10 overall pain with usual ADLS and with walking greater than 15 min.     Baseline  8/10 pain and can only walk 10-15 min    Status  New             Plan - 07/19/18 0855    Clinical Impression Statement  Pt presents with chronic lumbar-thoracic pain with stenosis and DDD. She  has referred pain into her posterior-lateral hips which is her biggest complaint. She has decreased ROM, decreased LE strength and core strength, decreased activity tolerance, and increased pain limiting her funciton. She will benefit from skilled PT to address these deficits.     History and Personal Factors relevant to plan of care:  ZOX:WRUEAVWU myelopathy, TIA,GERD, HTN,mitral valve prolapse, seizures, prev back surgery, prev knee surgery    Clinical Presentation  Evolving    Clinical Presentation due to:  worsening pain over last 2 months    Clinical Decision Making  Moderate    Rehab Potential  Good    PT Frequency  2x / week    PT Duration  6 weeks    PT Treatment/Interventions  Cryotherapy;Lawyer;Therapeutic activities;Therapeutic exercise;Neuromuscular re-education;Manual techniques;Passive range of motion;Dry needling;Taping;Spinal Manipulations   no traction or spinal mobs to cervical as she has had fusion   PT Next Visit Plan  review HEP, flexion based program for stenosis, core and hip strength as able    PT Home Exercise Plan  SKTC,DKTC, sitting lumbar flexion stretch and H.S stretch.    Consulted and Agree with Plan of Care  Patient       Patient will benefit from skilled therapeutic intervention in order to improve the following deficits and impairments:  Decreased activity tolerance, Decreased endurance, Decreased range of motion, Decreased strength, Increased muscle spasms, Postural dysfunction, Pain  Visit Diagnosis: Chronic bilateral low back pain without sciatica     Problem List Patient Active Problem List   Diagnosis Date Noted  . Eczema 02/11/2018  . Hyperlipidemia 06/26/2017  . TIA (transient ischemic attack) 06/23/2017  . Prediabetes 10/24/2016  . Health care maintenance 10/24/2016  . Callus of foot 04/29/2016  . Cervical myelopathy (HCC) 12/22/2014  . 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 Degenerative disc  disease, lumbar   . Mitral valve prolapse   . Insomnia 07/11/2010  . OBESITY 02/13/2009  . HYPERTENSION, BENIGN 08/07/2008  . Anxiety 02/26/2007  . Allergic rhinitis 02/26/2007  . GERD 02/26/2007    April Manson, PT, DPT 07/19/2018, 9:16 AM  [facility] [address] [place], [place], [address] Phone: [phone]   Fax:  [phone]  Name: Mariah [name] MRN: [id number] Date of Birth: [birth year]

## 2018-07-20 ENCOUNTER — Other Ambulatory Visit: Payer: Self-pay | Admitting: Family Medicine

## 2018-07-21 NOTE — Telephone Encounter (Addendum)
Spoke with patient regarding Xanax Rx. Per last note, was taking more for insomnia rather than anxiety which is now well controlled. At that time, started on SL melatonin and discussed taper of Xanax given h/o back and knee pain on Percocet prescribed by Orthopedics and danger of coprescribing benzo and opioid. Patient has not yet tried the SL melatonin (has tried PO form without effect) and has not established regular bedtime routine as previously discussed in last visit.  Introduced option to try different medication for sleep in addition to incorporating sleep hygiene techniques. Patient is again very reluctant to try new medications however agreed to read about recommended medications (SL melatonin or trazodone) and follow up in one month to discuss. Will ONLY provide small refill of Xanax to last until appointment, at that time we will discuss switching medications. Patient verbalized understanding and made appointment over the phone at the time of our discussion for 11/26.  Ellwood Dense, DO PGY-2, [facility] 07/21/2018 4:45 PM   Precepted with Dr. Deirdre Priest.

## 2018-07-30 ENCOUNTER — Encounter: Payer: Self-pay | Admitting: Physical Therapy

## 2018-07-30 ENCOUNTER — Ambulatory Visit: Payer: Medicaid Other | Admitting: Physical Therapy

## 2018-07-30 DIAGNOSIS — G8929 Other chronic pain: Secondary | ICD-10-CM | POA: Diagnosis not present

## 2018-07-30 DIAGNOSIS — M545 Low back pain, unspecified: Secondary | ICD-10-CM

## 2018-07-30 NOTE — Patient Instructions (Signed)

## 2018-07-30 NOTE — Therapy (Signed)
[facility] [address] [place], [place], [address] Phone: [phone]   Fax:  [phone]  Physical Therapy Treatment  Patient Details  Name: Mariah RichtersCynthia Y [name] MRN: [id number] Date of Birth: 1961/05/05 Referring Provider (PT): Daiva EvesJenkins   Encounter Date: 07/30/2018  PT End of Session - 07/30/18 0804    Visit Number  2    Number of Visits  12    Date for PT Re-Evaluation  09/13/18    Authorization Type  MCD    PT Start Time  0804    PT Stop Time  0846    PT Time Calculation (min)  42 min    Activity Tolerance  Patient tolerated treatment well    Behavior During Therapy  [facility] for tasks assessed/performed       Past Medical History:  Diagnosis Date  . Degenerative disc disease, lumbar   . GERD (gastroesophageal reflux disease)   . Hypertension   . Mitral valve prolapse   . Seizures (HCC) 06/23/2017    Past Surgical History:  Procedure Laterality Date  . ABDOMINAL HYSTERECTOMY    . ABDOMINAL HYSTERECTOMY    . BACK SURGERY    . CHOLECYSTECTOMY    . KNEE SURGERY      There were no vitals filed for this visit.  Subjective Assessment - 07/30/18 0806    Subjective  "i;ve been doing the exercises and they seem to be helping alot, I am still feeling pain in the back at a 7/10"    Currently in Pain?  Yes    Pain Score  7     Pain Location  Back    Pain Orientation  Right;Left    Pain Descriptors / Indicators  Tightness    Pain Type  Chronic pain    Pain Onset  More than a month ago    Pain Frequency  Constant    Aggravating Factors   walking, standing, sitting up right                       [facility] Adult PT Treatment/Exercise - 07/30/18 0809      Self-Care   Self-Care  Posture    Posture  proper posutre and lifting mechanics to reduce low back pain      Exercises   Exercises  Lumbar      Lumbar Exercises: Stretches   Lower Trunk Rotation Limitations  1 x 10     Hip Flexor Stretch  Left;Right;2  reps;30 seconds   with isometric glute squeeze     Lumbar Exercises: Aerobic   Nustep  L5 x 5 min UE/LE      Lumbar Exercises: Prone   Other Prone Lumbar Exercises  multifidus activation / abdominal draw in 10 x 5 sec hole   with pillows under hips     Moist Heat Therapy   Number Minutes Moist Heat  10 Minutes    Moist Heat Location  Lumbar Spine      Manual Therapy   Manual therapy comments  MTPR along the bil lumbar paraspinals             PT Education - 07/30/18 0835    Education Details  how to perform MTPR using tennis ball, posture education handout    Person(s) Educated  Patient    Methods  Explanation;Verbal cues;Handout;Demonstration    Comprehension  Verbalized understanding;Returned demonstration       PT Short Term Goals - 07/19/18 0901  PT SHORT TERM GOAL #1   Title  Pt will be I and compliant with initial HEP. 3 weeks 08/09/18    Baseline  no HEP until today      PT SHORT TERM GOAL #2   Title  Pt will report decreased pain from 8/10 average to less than 7/10 average. 3 weeks 08/09/18    Baseline  8/10        PT Long Term Goals - 07/19/18 0909      PT LONG TERM GOAL #1   Title  Pt will be I and compliant with final HEP. 8 weeks 09/13/18    Baseline  no HEP until today    Status  New      PT LONG TERM GOAL #2   Title  Pt will increase hip strength to at least 4+/5 MMT to increase function. 8 weeks 09/13/18    Baseline  4- to 4    Status  New      PT LONG TERM GOAL #3   Title  Pt will relay less than 3-4/10 overall pain with usual ADLS and with walking greater than 15 min.     Baseline  8/10 pain and can only walk 10-15 min    Status  New            Plan - 07/30/18 0848    Clinical Impression Statement  Mariah [name] reports consistency with her HEP noting improvement but continues to have pain at 7/10 today. reviewed previously provided HEP and updated for low back stretch and posture education. continued flexion biased exercise which  she did well with tactile cues for proper form. pt reported 5/10 pain end of session and declined modalities today.     PT Treatment/Interventions  Cryotherapy;Lawyer;Therapeutic activities;Therapeutic exercise;Neuromuscular re-education;Manual techniques;Passive range of motion;Dry needling;Taping;Spinal Manipulations    PT Next Visit Plan  review HEP, flexion based program for stenosis, core and hip strength as able    PT Home Exercise Plan  SKTC,DKTC, sitting lumbar flexion stretch and H.S stretch. childs pose, posture handout    Consulted and Agree with Plan of Care  Patient       Patient will benefit from skilled therapeutic intervention in order to improve the following deficits and impairments:  Decreased activity tolerance, Decreased endurance, Decreased range of motion, Decreased strength, Increased muscle spasms, Postural dysfunction, Pain  Visit Diagnosis: Chronic bilateral low back pain without sciatica     Problem List Patient Active Problem List   Diagnosis Date Noted  . Eczema 02/11/2018  . Hyperlipidemia 06/26/2017  . TIA (transient ischemic attack) 06/23/2017  . Prediabetes 10/24/2016  . Health care maintenance 10/24/2016  . Callus of foot 04/29/2016  . Cervical myelopathy (HCC) 12/22/2014  . 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 Degenerative disc disease, lumbar   . Mitral valve prolapse   . Insomnia 07/11/2010  . OBESITY 02/13/2009  . HYPERTENSION, BENIGN 08/07/2008  . Anxiety 02/26/2007  . Allergic rhinitis 02/26/2007  . GERD 02/26/2007   Mariah [name] PT, DPT, LAT, ATC  07/30/18  8:51 AM      [facility] [address] [place], [place], [address] Phone: [phone]   Fax:  [phone]  Name: Mariah [name] MRN: [id number] Date of Birth: 1961-02-06

## 2018-08-04 ENCOUNTER — Ambulatory Visit: Payer: Medicaid Other | Admitting: Physical Therapy

## 2018-08-09 ENCOUNTER — Ambulatory Visit: Payer: Medicaid Other | Admitting: Physical Therapy

## 2018-08-09 ENCOUNTER — Encounter: Payer: Self-pay | Admitting: Physical Therapy

## 2018-08-09 DIAGNOSIS — M545 Low back pain, unspecified: Secondary | ICD-10-CM

## 2018-08-09 DIAGNOSIS — G8929 Other chronic pain: Secondary | ICD-10-CM | POA: Diagnosis not present

## 2018-08-09 NOTE — Therapy (Signed)
[facility] [address] [place], [place], [address] Phone: [phone]   Fax:  [phone]  Physical Therapy Treatment  Patient Details  Name: Mariah [name] MRN: [id number] Date of Birth: 1961/08/07 Referring Provider (PT): Daiva Eves Date: 08/09/2018  PT End of Session - 08/09/18 0805    Visit Number  3    Number of Visits  12    Date for PT Re-Evaluation  09/13/18    Authorization Type  MCD    Authorization Time Period  07/19/2018-08/27/2018    Authorization - Visit Number  2    Authorization - Number of Visits  3    PT Start Time  0800    PT Stop Time  0840    PT Time Calculation (min)  40 min       Past Medical History:  Diagnosis Date  . Degenerative disc disease, lumbar   . GERD (gastroesophageal reflux disease)   . Hypertension   . Mitral valve prolapse   . Seizures (HCC) 06/23/2017    Past Surgical History:  Procedure Laterality Date  . ABDOMINAL HYSTERECTOMY    . ABDOMINAL HYSTERECTOMY    . BACK SURGERY    . CHOLECYSTECTOMY    . KNEE SURGERY      There were no vitals filed for this visit.  Subjective Assessment - 08/09/18 0802    Subjective  I think the exercises are helping and the sleep positioning is helping. No pain this morning.     Currently in Pain?  No/denies                       [facility] Adult PT Treatment/Exercise - 08/09/18 0001      Exercises   Exercises  Knee/Hip;Lumbar      Lumbar Exercises: Stretches   Active Hamstring Stretch  3 reps;20 seconds    Single Knee to Chest Stretch  2 reps;30 seconds    Double Knee to Chest Stretch  1 rep;30 seconds    Lower Trunk Rotation Limitations  1 x 10       Lumbar Exercises: Aerobic   Nustep  L5 x 5 min UE/LE      Lumbar Exercises: Supine   Ab Set  10 reps    Clam  20 reps    Clam Limitations  bilateral and unilateral     Heel Slides  10 reps    Bent Knee Raise  10 reps             PT Education -  08/09/18 0848    Education Details  HEP    Person(s) Educated  Patient    Methods  Explanation;Handout    Comprehension  Verbalized understanding       PT Short Term Goals - 07/19/18 0901      PT SHORT TERM GOAL #1   Title  Pt will be I and compliant with initial HEP. 3 weeks 08/09/18    Baseline  no HEP until today      PT SHORT TERM GOAL #2   Title  Pt will report decreased pain from 8/10 average to less than 7/10 average. 3 weeks 08/09/18    Baseline  8/10        PT Long Term Goals - 07/19/18 0909      PT LONG TERM GOAL #1   Title  Pt will be I and compliant with final HEP. 8 weeks 09/13/18    Baseline  no  HEP until today    Status  New      PT LONG TERM GOAL #2   Title  Pt will increase hip strength to at least 4+/5 MMT to increase function. 8 weeks 09/13/18    Baseline  4- to 4    Status  New      PT LONG TERM GOAL #3   Title  Pt will relay less than 3-4/10 overall pain with usual ADLS and with walking greater than 15 min.     Baseline  8/10 pain and can only walk 10-15 min    Status  New            Plan - 08/09/18 0836    Clinical Impression Statement  Pt arrives reporting no pain today and feels PT is helping. She does have increased pain with activity on her feet. Reviewed HEP and progressed with core strengthening. She declined HMP today.     PT Next Visit Plan  ask for more visits; review HEP, flexion based program for stenosis, core and hip strength as able    PT Home Exercise Plan  SKTC,DKTC, sitting lumbar flexion stretch and H.S stretch. childs pose, posture handout, prepilates series (Tra contract with marching, clams and heel raise.     Consulted and Agree with Plan of Care  Patient       Patient will benefit from skilled therapeutic intervention in order to improve the following deficits and impairments:  Decreased activity tolerance, Decreased endurance, Decreased range of motion, Decreased strength, Increased muscle spasms, Postural dysfunction,  Pain  Visit Diagnosis: Chronic bilateral low back pain without sciatica     Problem List Patient Active Problem List   Diagnosis Date Noted  . Eczema 02/11/2018  . Hyperlipidemia 06/26/2017  . TIA (transient ischemic attack) 06/23/2017  . Prediabetes 10/24/2016  . Health care maintenance 10/24/2016  . Callus of foot 04/29/2016  . Cervical myelopathy (HCC) 12/22/2014  . 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 Degenerative disc disease, lumbar   . Mitral valve prolapse   . Insomnia 07/11/2010  . OBESITY 02/13/2009  . HYPERTENSION, BENIGN 08/07/2008  . Anxiety 02/26/2007  . Allergic rhinitis 02/26/2007  . GERD 02/26/2007    Sherrie Mustacheonoho, Ebonye Reade McGee, PTA 08/09/2018, 8:50 AM  [facility] [address] [place], [place], [address] Phone: [phone]   Fax:  [phone]  Name: Mariah [name] MRN: [id number] Date of Birth: 02/17/1961

## 2018-08-09 NOTE — Progress Notes (Signed)
Subjective:   Patient ID: Mariah [name]    DOB: [birth year], 57 y.o. female   MRN: [id number]  Mariah [name] is a 57 y.o. female with a history of HTN, TIA, GERD, DDD, eczema, obesity, anxiety, insomnia, prediabetes, HLD here for   Insomnia - Seen 9/5, stated she was taking Xanax PRN for sleep. At that time, advised proper sleep hygiene, melatonin.  - Reports has been sleeping better the past week, attributes to "running around" and being tired whenever she gets ready to go to bed.  Will lay down around 9:30-10 PM and wake up around 5 AM.  Sometimes does wake up during the night to use the bathroom.  Will sometimes be able to get back to sleep, however does state that sometimes her mind will race with things that she knows she has to get done. - Has Xanax Rx, has only used 2 pills in the last month. - Previously tried melatonin but did not work for her, at last visit prescribed SL melatonin however has not used.  She is interested in trying this.  She is resistant to trying new medications for sleep as she is very concerned about new medication effects on her body and her ranitidine being recalled. - Does report some frustration as her husband will watch TV with the light on in their bedroom as she is trying to sleep and has difficulty falling asleep due to the noise and light. - Reports some stressors of husband not having a job right now and reports poor communication.  Has asked him to go to counseling together, however he is resistant.  Acid Reflux Has not taken ranitidine since she saw a recent recall on the knees.  Does not always have to take medication for her acid reflux has not tried any medication since she stopped taking ranitidine.  She has not tried Tums.  She reports avoiding fatty and fried foods.  Common symptoms include frequent belching.  Review of Systems:  Per HPI.  PMFSH, medications and smoking status reviewed.  Objective:   BP 122/62   Pulse 61   Temp 97.9 F  (36.6 C) (Oral)   Ht 5\' 6"  (1.676 m)   Wt 203 lb 6.4 oz (92.3 kg)   SpO2 99%   BMI 32.83 kg/m  Vitals and nursing note reviewed.  General: Obese female, in no acute distress with non-toxic appearance CV: regular rate and rhythm without murmurs, rubs, or gallops Lungs: clear to auscultation bilaterally with normal work of breathing Skin: warm, dry, no rashes or lesions Extremities: warm and well perfused, normal tone MSK: ROM grossly intact, strength intact, gait normal Neuro: Alert and oriented, speech normal  Assessment & Plan:   GERD Has since discontinued ranitidine.  Encouraged to try Tums OTC.  Follow-up if symptoms uncontrolled, can try PPI at that time.  OBESITY Likely contributing to acid reflux.  Encouraged continued diet modifications and to increase activity level in order to aid with treatment of insomnia as well.  Insomnia Provided prescription for SL melatonin.  Encouraged to increase activity level as well as this can be helpful in initiating sleep.   Advised patient may have benefit from earplugs and eye covers if husband continues to watch TV in the bedroom. Advised she may benefit from counseling herself if husband is not interested in couples counseling to better their communication and work to decrease her stress levels.  Will no longer prescribe Xanax for insomnia as not indicated and danger of respiratory depression given  she is taking Percocet for arthritic pain prescribed by orthopedics.  Patient verbalized understanding.  No orders of the defined types were placed in this encounter.  Meds ordered this encounter  Medications  . Melatonin 3 MG SUBL    Sig: Place 3 mg under the tongue at bedtime as needed.    Dispense:  90 tablet    Refill:  1  . hydrochlorothiazide (HYDRODIURIL) 25 MG tablet    Sig: Take 1 tablet (25 mg total) by mouth daily.    Dispense:  90 tablet    Refill:  0    Ellwood DenseAlison Shafin Pollio, DO PGY-2, [facility] 08/10/2018  9:55 AM

## 2018-08-10 ENCOUNTER — Encounter: Payer: Self-pay | Admitting: Family Medicine

## 2018-08-10 ENCOUNTER — Ambulatory Visit: Payer: Medicaid Other | Admitting: Family Medicine

## 2018-08-10 ENCOUNTER — Other Ambulatory Visit: Payer: Self-pay

## 2018-08-10 VITALS — BP 122/62 | HR 61 | Temp 97.9°F | Ht 66.0 in | Wt 203.4 lb

## 2018-08-10 DIAGNOSIS — E6609 Other obesity due to excess calories: Secondary | ICD-10-CM

## 2018-08-10 DIAGNOSIS — G4700 Insomnia, unspecified: Secondary | ICD-10-CM | POA: Diagnosis not present

## 2018-08-10 DIAGNOSIS — R7303 Prediabetes: Secondary | ICD-10-CM | POA: Diagnosis not present

## 2018-08-10 DIAGNOSIS — K219 Gastro-esophageal reflux disease without esophagitis: Secondary | ICD-10-CM

## 2018-08-10 DIAGNOSIS — Z6832 Body mass index (BMI) 32.0-32.9, adult: Secondary | ICD-10-CM

## 2018-08-10 MED ORDER — HYDROCHLOROTHIAZIDE 25 MG PO TABS: 25.0000 mg | ORAL_TABLET | Freq: Every day | ORAL | 0 refills | Status: DC

## 2018-08-10 MED ORDER — MELATONIN 3 MG SL SUBL: 3.0000 mg | SUBLINGUAL_TABLET | Freq: Every evening | SUBLINGUAL | 1 refills | Status: DC | PRN

## 2018-08-10 NOTE — Patient Instructions (Signed)
It was great to see you! I'm glad you're sleeping better!  Our plans for today:  - We will try melatonin for sleep - Increasing your activity level as you have been doing will help you to sleep better at night also.  Take care and seek immediate care sooner if you develop any concerns.   Dr. Mollie Germanyumball [facility]

## 2018-08-10 NOTE — Assessment & Plan Note (Addendum)
Provided prescription for SL melatonin.  Encouraged to increase activity level as well as this can be helpful in initiating sleep.   Advised patient may have benefit from earplugs and eye covers if husband continues to watch TV in the bedroom. Advised she may benefit from counseling herself if husband is not interested in couples counseling to better their communication and work to decrease her stress levels.  Will no longer prescribe Xanax for insomnia as not indicated and danger of respiratory depression given she is taking Percocet for arthritic pain prescribed by orthopedics.  Patient verbalized understanding.

## 2018-08-10 NOTE — Assessment & Plan Note (Signed)
Likely contributing to acid reflux.  Encouraged continued diet modifications and to increase activity level in order to aid with treatment of insomnia as well.

## 2018-08-10 NOTE — Assessment & Plan Note (Signed)
Has since discontinued ranitidine.  Encouraged to try Tums OTC.  Follow-up if symptoms uncontrolled, can try PPI at that time.

## 2018-08-17 ENCOUNTER — Ambulatory Visit: Payer: Medicaid Other | Attending: Neurosurgery | Admitting: Physical Therapy

## 2018-08-17 ENCOUNTER — Encounter: Payer: Self-pay | Admitting: Physical Therapy

## 2018-08-17 DIAGNOSIS — G8929 Other chronic pain: Secondary | ICD-10-CM | POA: Diagnosis not present

## 2018-08-17 DIAGNOSIS — M545 Low back pain, unspecified: Secondary | ICD-10-CM

## 2018-08-17 NOTE — Patient Instructions (Signed)
  Progress to legs straight from bent legs show in picture as you get stronger Garen LahLawrie Beardsley, PT Certified Exercise Expert for the Aging Adult  08/17/18 9:53 AM Phone: [phone] Fax: [phone]

## 2018-08-17 NOTE — Therapy (Addendum)
[place], [place], [address] Phone: [phone]   Fax:  [phone]  Physical Therapy Treatment/MCD reauthorization/Discharge NOte  Patient Details  Name: Mariah [name] MRN: [id number] Date of Birth: 08-10-1961 Referring Provider (PT): Sandre Kitty Date: 08/17/2018  PT End of Session - 08/17/18 0936    Visit Number  4    Number of Visits  12    Date for PT Re-Evaluation  09/13/18    Authorization Type  MCD 2nd reauthorization pending    Authorization Time Period  07/19/2018-08/27/2018 1st auth. waiting on second reauthorizaiton    PT Start Time  0933    PT Stop Time  1015    PT Time Calculation (min)  42 min    Activity Tolerance  Patient tolerated treatment well    Behavior During Therapy  [facility] for tasks assessed/performed       Past Medical History:  Diagnosis Date  . Degenerative disc disease, lumbar   . GERD (gastroesophageal reflux disease)   . Hypertension   . Mitral valve prolapse   . Seizures ([place]) 06/23/2017    Past Surgical History:  Procedure Laterality Date  . ABDOMINAL HYSTERECTOMY    . ABDOMINAL HYSTERECTOMY    . BACK SURGERY    . CHOLECYSTECTOMY    . KNEE SURGERY      There were no vitals filed for this visit.  Subjective Assessment - 08/17/18 0944    Subjective  I think i am doing well with exericises . I am in more pain this morning.     Pertinent History  EKC:MKLKJZPH myelopathy, TIA,GERD, HTN,mitral valve prolapse, seizures, prev back surgery, prev knee surgery    Limitations  Sitting;Standing;Walking;House hold activities    How long can you sit comfortably?  15-20 minutes    How long can you stand comfortably?  15-20 minutes    How long can you walk comfortably?  15- 20 minutes    Diagnostic tests  MRI and x-rays show stenosis and DDD per pt report    Patient Stated Goals  minimize her symptoms    Currently in Pain?  Yes    Pain Score  6     Pain Location   Back    Pain Orientation  Right;Left    Pain Descriptors / Indicators  Tightness;Aching    Pain Type  Chronic pain    Pain Onset  More than a month ago    Pain Frequency  Constant         [facility] PT Assessment - 08/17/18 0958      Assessment   Medical Diagnosis  lumbar-thoraic pain, stenosis/DDD    Referring Provider (PT)  Arnoldo Morale    Onset Date/Surgical Date  --   chronic   Next MD Visit  Jan      Observation/Other Assessments   Focus on Therapeutic Outcomes (FOTO)   not done, MCD      Sensation   Light Touch  Appears Intact      ROM / Strength   AROM / PROM / Strength  AROM;Strength      AROM   Overall AROM Comments  % available    AROM Assessment Site  Lumbar    Lumbar Flexion  100%    Lumbar Extension  75%    Lumbar - Right Side Bend  75%    Lumbar - Left Side Bend  75%    Lumbar - Right Rotation  75%    Lumbar -  Left Rotation  75%      Strength   Strength Assessment Site  Hip;Knee    Right/Left Hip  Right;Left    Right Hip Flexion  4+/5    Right Hip ABduction  4/5    Left Hip Flexion  4/5    Left Hip ABduction  4/5    Right Knee Flexion  5/5    Right Knee Extension  5/5    Left Knee Flexion  4+/5    Left Knee Extension  4+/5      Palpation   Palpation comment  TTP lumbar-thoracic paraspinals and glutes especially right QL today                   [facility] Adult PT Treatment/Exercise - 08/17/18 0946      Exercises   Exercises  Knee/Hip;Lumbar      Lumbar Exercises: Stretches   Single Knee to Chest Stretch  2 reps;30 seconds    Double Knee to Chest Stretch  1 rep;30 seconds    Lower Trunk Rotation Limitations  5 x right and left 10 sec hold      Lumbar Exercises: Supine   Ab Set  10 reps    Clam  20 reps    Clam Limitations  bilateral and unilateral     Heel Slides  10 reps    Bent Knee Raise  10 reps      Lumbar Exercises: Quadruped   Madcat/Old Horse  15 reps    Madcat/Old Horse Limitations  VC and TC    Single Arm Raise  15 reps     Single Arm Raises Limitations  VC and TC and feels better    Straight Leg Raise  15 reps    Straight Leg Raises Limitations  VC and TC      Modalities   Modalities  --   declined heat today            PT Education - 08/17/18 0954    Education Details  added Dead bug to HEP    Person(s) Educated  Patient    Methods  Explanation;Demonstration;Tactile cues;Verbal cues;Handout    Comprehension  Verbalized understanding;Returned demonstration       PT Short Term Goals - 08/17/18 4801      PT SHORT TERM GOAL #1   Title  Pt will be I and compliant with initial HEP. 3 weeks 08/09/18    Baseline  Pt is independent HEP and working towards advanced HEP    Time  3    Period  Weeks    Status  Achieved      PT SHORT TERM GOAL #2   Title  Pt will report decreased pain from 8/10 average to less than 7/10 average. 3 weeks 08/09/18    Baseline  6/10 and moving in the right direction    Time  3    Period  Weeks    Status  Achieved        PT Long Term Goals - 08/17/18 1003      PT LONG TERM GOAL #1   Title  Pt will be I and compliant with final HEP. 8 weeks 09/13/18    Baseline  independent with initial HEP and progressing to advanced 09/13/18    Time  4    Period  Weeks    Status  New    Target Date  09/13/18      PT LONG TERM GOAL #2   Title  Pt  will increase hip strength to at least 4+/5 MMT to increase function. 8 weeks 09/13/18    Baseline  improved to at least 4/5 today at minimum    Time  4    Period  Weeks    Status  On-going    Target Date  09/13/18      PT LONG TERM GOAL #3   Title  Pt will relay less than 3-4/10 overall pain with usual ADLS and with walking greater than 15 min.     Baseline  6/10  08-18-18  but pain improves with exericise    Time  4    Period  Weeks    Status  On-going    Target Date  09/13/18            Plan - 08/17/18 1011    Clinical Impression Statement  Pt arrives with 6/10 pain improved from 8/10 from evaluation.  Pt has  achieved all STG's and reports that exercise does improve symptoms.  Pt is able to use handouts and is compliant with initial HEP. Pt with improvement in MMT in LE to at lest 4/5 or greater. Pt now progressing to advanced exercises. PT tried to educate pt on pain science but pt  is not interested.  Will continue to reinforce and progress advanced exercise before DC at end of December.    Rehab Potential  Good    PT Frequency  2x / week    PT Duration  6 weeks    PT Treatment/Interventions  Cryotherapy;Air traffic controller;Therapeutic activities;Therapeutic exercise;Neuromuscular re-education;Manual techniques;Passive range of motion;Dry needling;Taping;Spinal Manipulations    PT Next Visit Plan  MCD 2nd auth done and turned in. HEP, flexion based program for stenosis, core and hip strength as able    PT Home Exercise Plan  SKTC,DKTC, sitting lumbar flexion stretch and H.S stretch. childs pose, posture handout, prepilates series (Tra contract with marching, clams and heel raise.  q ped, cat /camel    Consulted and Agree with Plan of Care  Patient       Patient will benefit from skilled therapeutic intervention in order to improve the following deficits and impairments:  Decreased activity tolerance, Decreased endurance, Decreased range of motion, Decreased strength, Increased muscle spasms, Postural dysfunction, Pain  Visit Diagnosis: Chronic bilateral low back pain without sciatica     Problem List Patient Active Problem List   Diagnosis Date Noted  . Eczema 02/11/2018  . Hyperlipidemia 06/26/2017  . TIA (transient ischemic attack) 06/23/2017  . Prediabetes 10/24/2016  . Health care maintenance 10/24/2016  . Callus of foot 04/29/2016  . Cervical myelopathy ([place]) 12/22/2014  . 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 Degenerative disc disease, lumbar   . Mitral valve prolapse   . Insomnia 07/11/2010  . OBESITY 02/13/2009  . HYPERTENSION, BENIGN 08/07/2008   . Anxiety 02/26/2007  . Allergic rhinitis 02/26/2007  . GERD 02/26/2007    Voncille Lo, PT Certified Exercise Expert for the Aging Adult  08/17/18 11:28 AM Phone: [phone] Fax: [facility] [address] [place], [place], [address] Phone: [phone]   Fax:  [phone]  Name: Mariah [name] MRN: [id number] Date of Birth: 1961/03/23   PHYSICAL THERAPY DISCHARGE SUMMARY  Visits from Start of Care: 6  Current functional level related to goals / functional outcomes: unknown   Remaining deficits: unknown   Education / Equipment: Initial HEP  Plan:  Patient goals were not met. Patient is being discharged due to not returning since the last visit.  ?????    Pt did not show for 2 appt and as per clinic policy was discharged .  Voncille Lo, PT Certified Exercise Expert for the Aging Adult  10/12/18 6:02 PM Phone: [phone] Fax: [phone]

## 2018-08-19 DIAGNOSIS — M545 Low back pain: Secondary | ICD-10-CM | POA: Diagnosis not present

## 2018-08-25 DIAGNOSIS — M545 Low back pain: Secondary | ICD-10-CM | POA: Diagnosis not present

## 2018-08-27 ENCOUNTER — Encounter: Payer: Self-pay | Admitting: Physical Therapy

## 2018-08-27 ENCOUNTER — Encounter

## 2018-08-30 DIAGNOSIS — M545 Low back pain: Secondary | ICD-10-CM | POA: Diagnosis not present

## 2018-09-01 ENCOUNTER — Ambulatory Visit: Payer: Medicaid Other | Admitting: Physical Therapy

## 2018-09-02 ENCOUNTER — Other Ambulatory Visit: Payer: Self-pay | Admitting: Internal Medicine

## 2018-09-03 ENCOUNTER — Telehealth: Payer: Self-pay | Admitting: Physical Therapy

## 2018-09-03 ENCOUNTER — Ambulatory Visit: Payer: Medicaid Other | Admitting: Physical Therapy

## 2018-09-03 NOTE — Telephone Encounter (Signed)
LVM regarding missed appointment today. Stated we did get her Medicaid approval and that she had another visit on 12/23 at 8:45. If she was unable to attend that visit to call and we can cancel or reschedule that appointment.

## 2018-09-06 ENCOUNTER — Telehealth: Payer: Self-pay | Admitting: Physical Therapy

## 2018-09-06 ENCOUNTER — Ambulatory Visit: Payer: Medicaid Other | Admitting: Physical Therapy

## 2018-09-06 NOTE — Telephone Encounter (Signed)
LVM regarding missed appointment today. Noted that today marks her second missed appointment and per our clinic policy we are required to cancel her next appointment. If she is interested in resuming physical therapy she is able to call and we can schedule her an appointment 1 at a time.

## 2018-09-10 ENCOUNTER — Ambulatory Visit: Payer: Medicaid Other | Admitting: Physical Therapy

## 2018-09-26 ENCOUNTER — Other Ambulatory Visit: Payer: Self-pay | Admitting: Family Medicine

## 2018-10-11 ENCOUNTER — Encounter: Payer: Self-pay | Admitting: Family Medicine

## 2018-10-11 ENCOUNTER — Ambulatory Visit (INDEPENDENT_AMBULATORY_CARE_PROVIDER_SITE_OTHER): Payer: Medicaid Other | Admitting: Family Medicine

## 2018-10-11 VITALS — BP 125/60 | HR 58 | Temp 97.9°F | Wt 209.0 lb

## 2018-10-11 DIAGNOSIS — I10 Essential (primary) hypertension: Secondary | ICD-10-CM | POA: Diagnosis not present

## 2018-10-11 DIAGNOSIS — R040 Epistaxis: Secondary | ICD-10-CM | POA: Diagnosis not present

## 2018-10-11 DIAGNOSIS — F5101 Primary insomnia: Secondary | ICD-10-CM | POA: Diagnosis not present

## 2018-10-11 DIAGNOSIS — J301 Allergic rhinitis due to pollen: Secondary | ICD-10-CM | POA: Diagnosis not present

## 2018-10-11 IMAGING — MG DIGITAL SCREENING BILATERAL MAMMOGRAM WITH TOMO AND CAD
8 series · 8 of 24 positions shown · non-contrast
Comparison: Previous exam(s).

CLINICAL DATA: Screening.

EXAM:
DIGITAL SCREENING BILATERAL MAMMOGRAM WITH TOMO AND CAD

[R CC synth-2D]
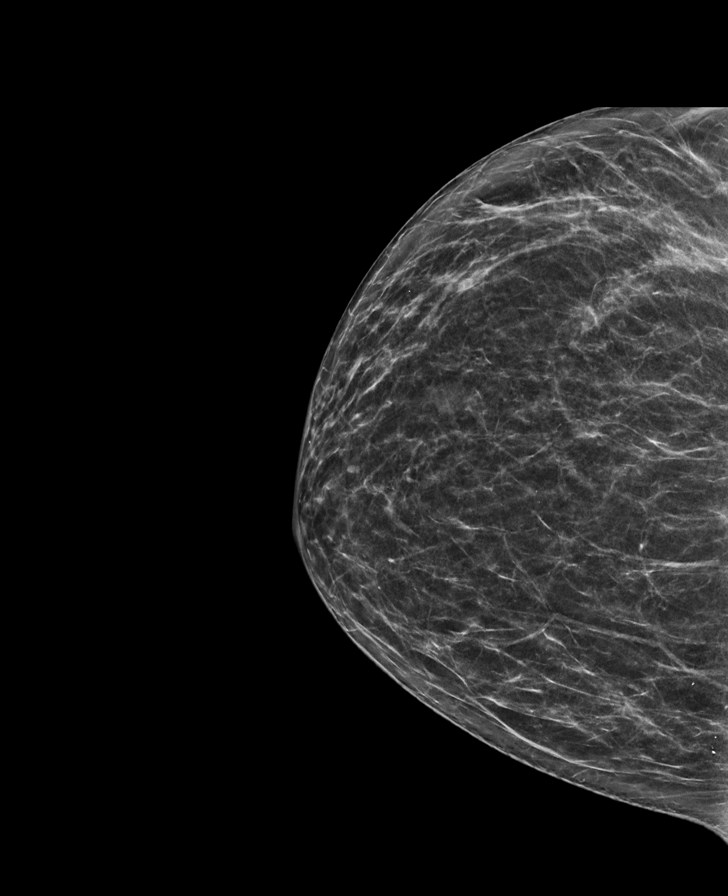

[R MLO synth-2D]
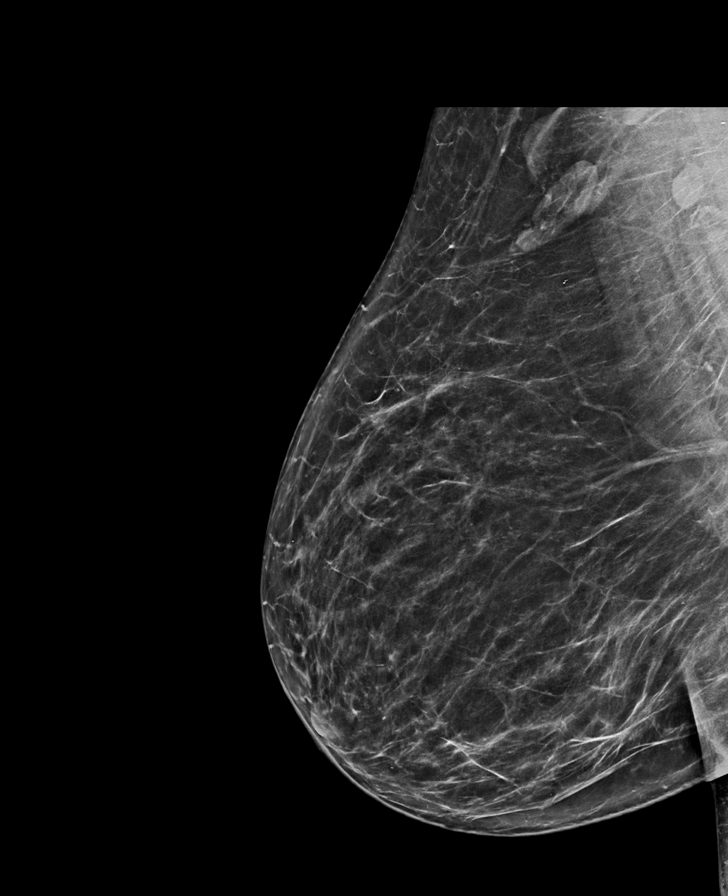

[L CC synth-2D]
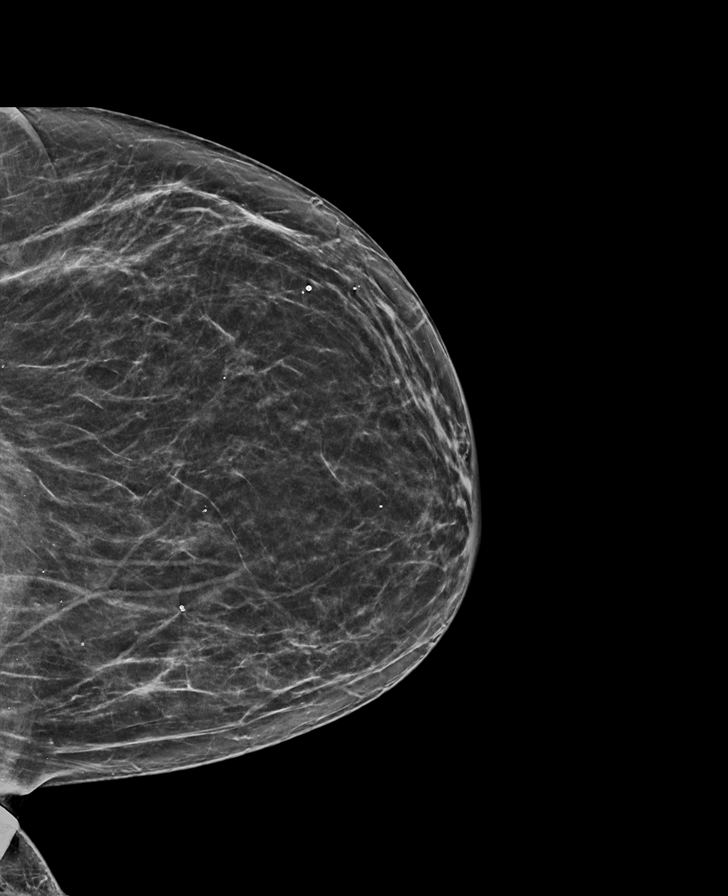

[L MLO synth-2D]
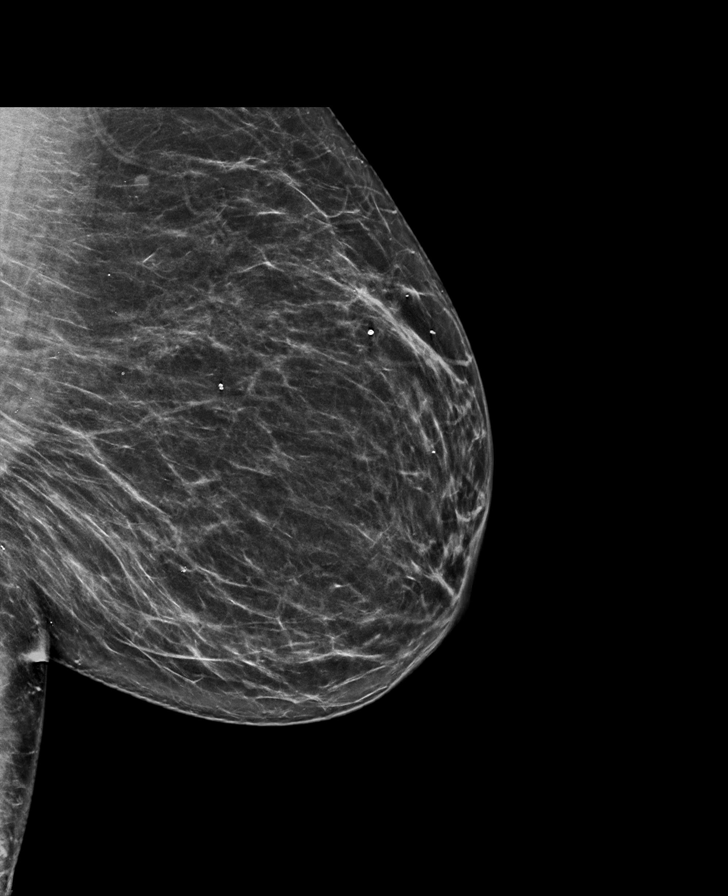

[L MLO tomo · tomo slice 37/74.0]
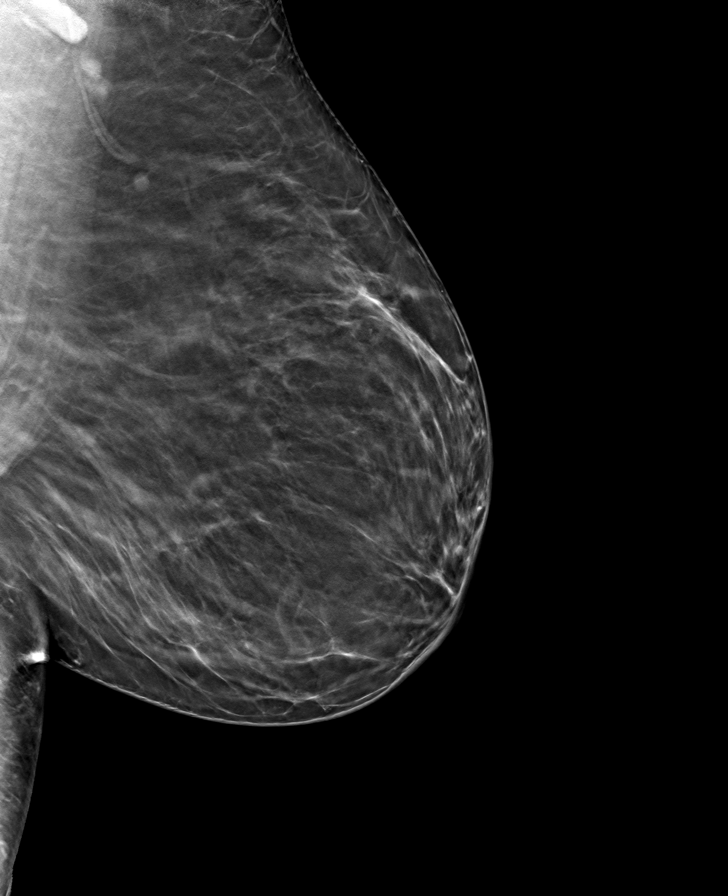

[R MLO tomo · tomo slice 41/81.0]
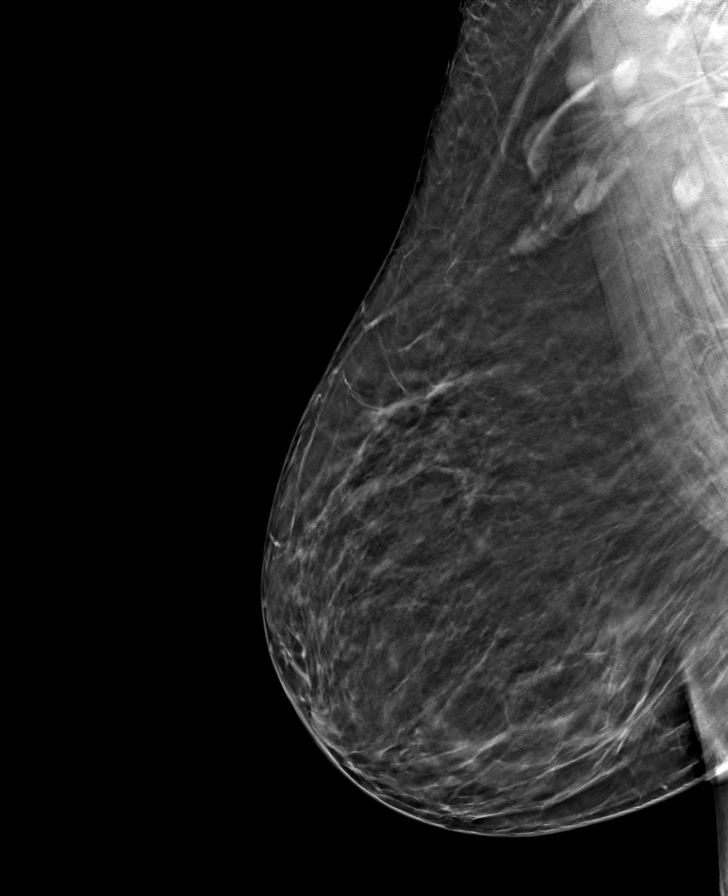

[R CC tomo · tomo slice 32/63.0]
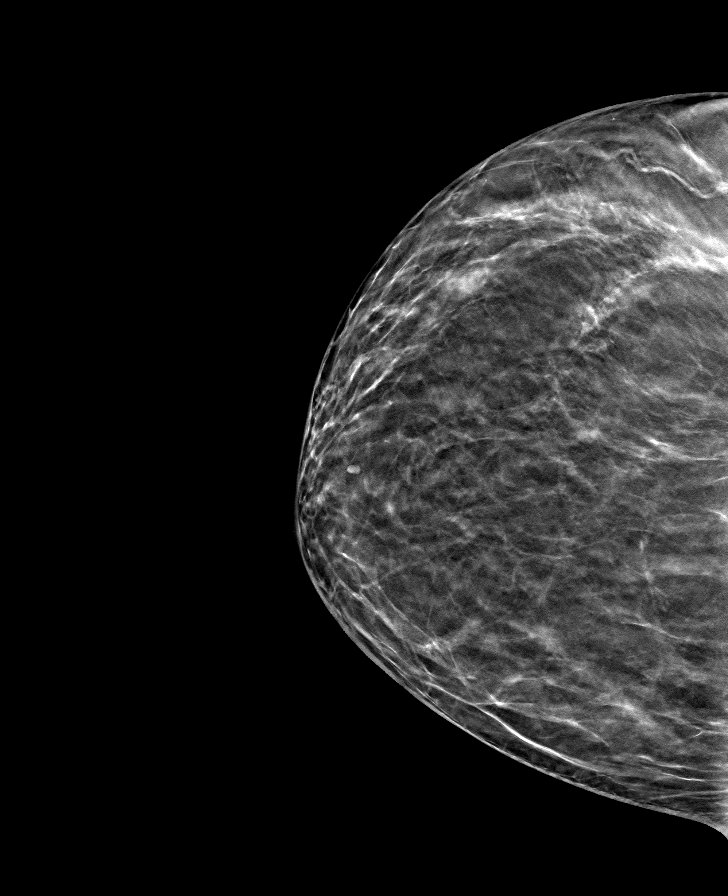

[L CC tomo · tomo slice 35/68.0]
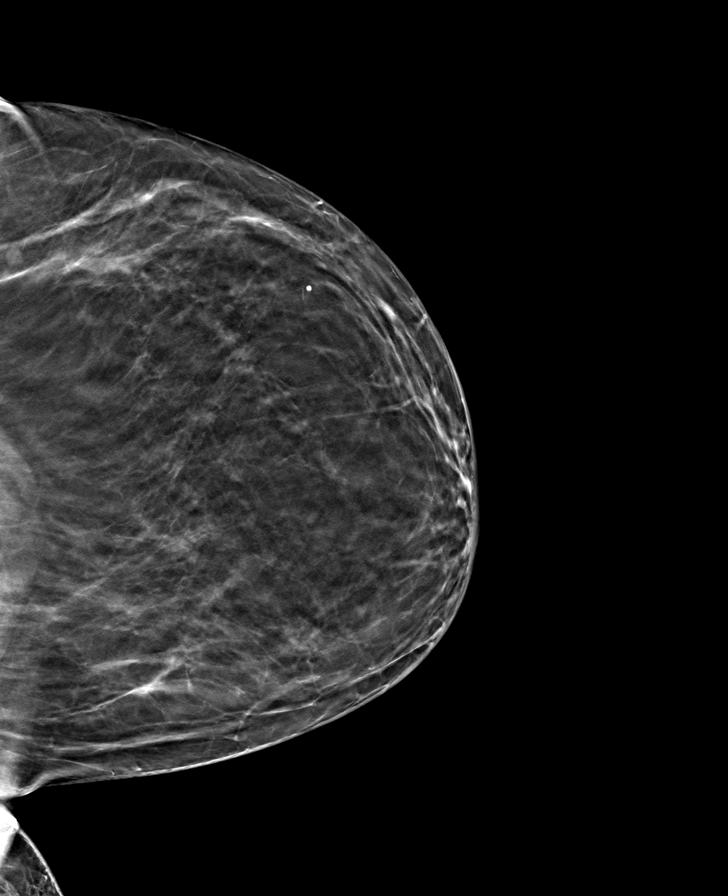

[8 of 24 positions shown; findings below may reference images not displayed]

ACR Breast Density Category b: There are scattered areas of
fibroglandular density.
FINDINGS: There are no findings suspicious for malignancy. Images were
processed with CAD.
IMPRESSION: No mammographic evidence of malignancy. A result letter of this
screening mammogram will be mailed directly to the patient.

RECOMMENDATION:
Screening mammogram in one year. (Code:[id number])

BI-RADS CATEGORY  1: Negative.

## 2018-10-11 MED ORDER — CETIRIZINE HCL 10 MG PO TABS: 10.0000 mg | ORAL_TABLET | Freq: Every day | ORAL | 11 refills | Status: DC

## 2018-10-11 NOTE — Patient Instructions (Addendum)
   No TV in the bedroom please!   The bedroom should be quiet and dark   It was wonderful to see you today.  Thank you for choosing [facility].   Please call [phone] with any questions about today's appointment.  Please be sure to schedule follow up at the front  desk before you leave today.   Terisa Starr, MD  Family Medicine    Stop the flonase for two weeks   Humidifier in the room  Vaseline at night on your nose

## 2018-10-11 NOTE — Progress Notes (Signed)
  Patient Name: Mariah RichtersCynthia Y [name] Date of Birth: 10-30-1960 Date of Visit: 10/11/18 PCP: Ellwood Denseumball, Alison, DO  Chief Complaint: sleep and nosebleeds   Subjective: Mariah RichtersCynthia Y [name] is a pleasant 58 y.o. woman with history significant for allergic rhinitis, essential hypertension and anxiety presenting today with 2 concerns.  The patient reports 3 episodes of epistaxis in the last week.  All episodes were precipitated by her sneezing or inducing digital trauma to her left nare.  Reports she has had some mild congestion for which she is using Flonase, add twice daily saline rinse.  She does not have a humidifier in her room.  She reports each nosebleed lasted fewer than 2 minutes.  These were easily stopped.  She does take Plavix for her history of transient ischemic attack.   The patient reports her sleep is poor.  She is previously on Xanax nightly for this.  She had tried to taper this down to once in a while and was switched to melatonin.  She reports melatonin is not working for her.  She goes to bed around 10 PM.  Her room is not quiet or dark.  Her husband has a TV on at night.  This is how he goes to sleep.  She wakes up at the same time every day.  She does not drink caffeine afternoon.  She does not use alcohol.  She does not smoke.  She does not snore or have symptoms of obstructive sleep apnea she reports.  She requests a refill on her Xanax today.   ROS:  ROS As above I have reviewed the patient's medical, surgical, family, and social history as appropriate.   Vitals:   10/11/18 1005  BP: 125/60  Pulse: (!) 58  Temp: 97.9 F (36.6 C)  SpO2: 99%   Filed Weights   10/11/18 1005  Weight: 209 lb (94.8 kg)  HEENT: Sclera anicteric. Dentition is moderate. Appears well hydrated.  Bilateral nasal passages with moderate amount of congestion.  There is no evidence of recent trauma or bleeding on the left nasal passage. Neck: Supple Cardiac: Regular rate and rhythm. Normal S1/S2. No  murmurs, rubs, or gallops appreciated. Lungs: Clear bilaterally to ascultation.    Extremities: Warm, well perfused without edema.  Skin: Warm, dry Psych: Pleasant and appropriate   Diagnoses and all orders for this visit:  HYPERTENSION, BENIGN -     Basic Metabolic Panel  Epistaxis likely due to digital trauma and dryer.  Recommended humidifier and Vaseline.  If these persist or worsen would recommend evaluation with ENT particularly if they continue to be unilateral in nature. -     CBC  Primary insomnia I discussed at length the adverse events and limited evidence for benefit of benzodiazepines for sleep.  Discussed that I would not be prescribing Xanax for insomnia.  The patient voiced understanding and was accepting of this.  She will try to discuss turning off the TV in the bedroom with her husband.  The patient is also on oxycodone which she is getting refilled by another practice-- I would use extreme caution in prescribing benzodiazepines in the future.  We discussed the associate with falls and dementia with the persistent use of these medications.  Allergic rhinitis due to pollen, unspecified seasonality -     cetirizine (ZYRTEC) 10 MG tablet; Take 1 tablet (10 mg total) by mouth daily.    Terisa Starrarina Oriyah Lamphear, MD  Family Medicine Teaching Service

## 2018-10-12 ENCOUNTER — Encounter: Payer: Self-pay | Admitting: Family Medicine

## 2018-10-12 ENCOUNTER — Telehealth: Payer: Self-pay | Admitting: Family Medicine

## 2018-10-12 LAB — CBC
Hematocrit: 40.1 % (ref 34.0–46.6)
Hemoglobin: 13 g/dL (ref 11.1–15.9)
MCH: 27 pg (ref 26.6–33.0)
MCHC: 32.4 g/dL (ref 31.5–35.7)
MCV: 83 fL (ref 79–97)
Platelets: 238 10*3/uL (ref 150–450)
RBC: 4.81 x10E6/uL (ref 3.77–5.28)
RDW: 13.8 % (ref 11.7–15.4)
WBC: 7.8 10*3/uL (ref 3.4–10.8)

## 2018-10-12 LAB — BASIC METABOLIC PANEL
BUN/Creatinine Ratio: 15 (ref 9–23)
BUN: 11 mg/dL (ref 6–24)
CO2: 25 mmol/L (ref 20–29)
Calcium: 9.4 mg/dL (ref 8.7–10.2)
Chloride: 100 mmol/L (ref 96–106)
Creatinine, Ser: 0.72 mg/dL (ref 0.57–1.00)
GFR calc Af Amer: 108 mL/min/{1.73_m2} (ref >59)
GFR calc non Af Amer: 93 mL/min/{1.73_m2} (ref >59)
Glucose: 114 mg/dL — ABNORMAL HIGH (ref 65–99)
Potassium: 3.9 mmol/L (ref 3.5–5.2)
Sodium: 139 mmol/L (ref 134–144)

## 2018-10-12 NOTE — Telephone Encounter (Signed)
Attempted to call patient with results--- busy signal on phone. Overall, results are normal. Non-fasting BG within expected range. Will send letter.   Terisa Starr, MD  Family Medicine Teaching Service

## 2018-10-15 ENCOUNTER — Telehealth: Payer: Self-pay | Admitting: Family Medicine

## 2018-10-15 DIAGNOSIS — R040 Epistaxis: Secondary | ICD-10-CM

## 2018-10-15 NOTE — Telephone Encounter (Signed)
Pt saw Dr. Manson Passey on 10/11/18 and said that Dr. Manson Passey asked her to let her know if she was continuing to have nose bleeds. Pt has had nose bleeds since her appointment and would like to know if she can have a ENT referral placed.   I offered the patient a f/u appt like Dr. Manson Passey suggested but the pt said she would rather have the referral.   Pt would like for Dr. Manson Passey to call her at [phone].

## 2018-10-15 NOTE — Telephone Encounter (Signed)
Will forward to Dr. Brown.  Nekoda Chock,CMA  

## 2018-10-15 NOTE — Telephone Encounter (Signed)
Called patient, received voicemail. Left message to call back.  Terisa Starr, MD  Family Medicine Teaching Service

## 2018-10-18 NOTE — Telephone Encounter (Signed)
Attempted to call patient again. As discussed in visit, referral to ENT placed.   Terisa Starr, MD  Family Medicine Teaching Service

## 2018-11-02 DIAGNOSIS — R040 Epistaxis: Secondary | ICD-10-CM | POA: Diagnosis not present

## 2018-11-02 DIAGNOSIS — J3089 Other allergic rhinitis: Secondary | ICD-10-CM | POA: Diagnosis not present

## 2018-11-06 ENCOUNTER — Other Ambulatory Visit: Payer: Self-pay | Admitting: Family Medicine

## 2018-12-01 ENCOUNTER — Other Ambulatory Visit: Payer: Self-pay | Admitting: Family Medicine

## 2018-12-27 ENCOUNTER — Other Ambulatory Visit: Payer: Self-pay

## 2018-12-27 MED ORDER — ATORVASTATIN CALCIUM 40 MG PO TABS: 40.0000 mg | ORAL_TABLET | Freq: Every day | ORAL | 3 refills | Status: DC

## 2018-12-27 MED ORDER — ALPRAZOLAM 1 MG PO TABS: ORAL_TABLET | ORAL | 0 refills | Status: DC

## 2018-12-27 NOTE — Telephone Encounter (Signed)
Pt called nurse line requesting refills on lipitor and xanax. Xanax last given looks like back in November. Informed patient I will ask pcp, however may need a phone visit. Please advise. Pt only requesting a small amount due to the coronavirus.

## 2018-12-27 NOTE — Telephone Encounter (Signed)
Covering inbox for Dr. Linwood Dibbles while on vacation. Will refill one time without telephone visit. If she requires more, then will need a virtual visit.  Myrene Buddy MD PGY-2 Family Medicine Resident

## 2019-02-01 DIAGNOSIS — M545 Low back pain: Secondary | ICD-10-CM | POA: Diagnosis not present

## 2019-02-08 ENCOUNTER — Other Ambulatory Visit: Payer: Self-pay | Admitting: Family Medicine

## 2019-02-09 ENCOUNTER — Telehealth (INDEPENDENT_AMBULATORY_CARE_PROVIDER_SITE_OTHER): Payer: Medicaid Other | Admitting: Family Medicine

## 2019-02-09 ENCOUNTER — Other Ambulatory Visit: Payer: Self-pay

## 2019-02-09 DIAGNOSIS — F419 Anxiety disorder, unspecified: Secondary | ICD-10-CM | POA: Diagnosis not present

## 2019-02-09 NOTE — Assessment & Plan Note (Signed)
Worsened since COVID pandemic and financial stress with husband out of work. Asking for refill for xanax but explained to patient we discontinued this per our last visit. Again reiterated that I would not prescribe this as it is not currently indicated, especially given her chronic opioid use not prescribed by this practice and not a safe medication for her. She was very upset by this. Offered patient counseling and SSRI initiation but patient but patient declined. She can continue to work on coping mechanisms and let us know if she changes her mind but, again, would NOT refill her xanax. Med list updated to reflect this.

## 2019-02-09 NOTE — Progress Notes (Signed)
[facility] Telemedicine Visit  Patient consented to have virtual visit. Method of visit: Telephone  Encounter participants: Patient: Mariah [name] - located at home Provider: Ellwood Dense - located at [facility] Others (if applicable): N/A  Chief Complaint: anxiety  HPI:  Anxiety - Medications: none - Taking: N/A - Current stressors: husband out of work. Experiencing financial stress. - Coping Mechanisms: typically will go to the gym and work out but is unable to go right now due to COVID pandemic. Prays and reads bible which helps. - never been in counseling - previously was on xanax for insomnia but was discontinued. Has never tried any other medications for anxiety.  ROS: per HPI  Pertinent PMHx: HTN, anxiety, obesity, DDD  Exam:  Respiratory: speaks in full sentences, no respiratory distress  Assessment/Plan:  Anxiety Worsened since COVID pandemic and financial stress with husband out of work. Asking for refill for xanax but explained to patient we discontinued this per our last visit. Again reiterated that I would not prescribe this as it is not currently indicated, especially given her chronic opioid use not prescribed by this practice and not a safe medication for her. She was very upset by this. Offered patient counseling and SSRI initiation but patient but patient declined. She can continue to work on coping mechanisms and let us know if she changes her mind but, again, would NOT refill her xanax. Med list updated to reflect this.   Time spent during visit with patient: 7 minutes

## 2019-02-22 ENCOUNTER — Other Ambulatory Visit: Payer: Self-pay

## 2019-02-22 ENCOUNTER — Ambulatory Visit (INDEPENDENT_AMBULATORY_CARE_PROVIDER_SITE_OTHER): Payer: Medicaid Other | Admitting: Family Medicine

## 2019-02-22 VITALS — BP 132/62 | HR 68 | Wt 204.8 lb

## 2019-02-22 DIAGNOSIS — F419 Anxiety disorder, unspecified: Secondary | ICD-10-CM

## 2019-02-22 DIAGNOSIS — J301 Allergic rhinitis due to pollen: Secondary | ICD-10-CM | POA: Diagnosis not present

## 2019-02-22 MED ORDER — CETIRIZINE HCL 5 MG PO TABS: 5.0000 mg | ORAL_TABLET | Freq: Every day | ORAL | 1 refills | Status: DC

## 2019-02-22 NOTE — Progress Notes (Signed)
    Subjective:  Mariah [name] is a 58 y.o. female who presents to the [facility] today with a chief complaint of pressure in right ear.   HPI: Patient with history of nasal congestion and impacted cerumen presents complaining of chronic pressure in right ear.  She says this happens multiple times per year, says sometimes it gets better when someone washes at her ear.  She said it did also get better when she was recently on cetirizine, which she stopped because she said that her sinuses were drying out to the point that she was getting nosebleeds.  No fevers, no cough, no respiratory symptoms.  No visible rhinorrhea but says she does feel congested.  No mastoiditis no ear pain no manipulation of the ear on either side  Patient still with anxiety problems, would not like SSRI because she says that she is use them in the past and they do not work.  We discussed conflict with prescribing on Xanax given that she has a chronic narcotic medication use and patient expresses understanding.  Do not suspect illicit purposes but with this complicated patient we have offered psychiatric referral.  Patient wants time to look around town to find one that she wants and will then call back in to get referral placed.   Objective:  Physical Exam: BP 132/62   Pulse 68   Wt 204 lb 12.8 oz (92.9 kg)   SpO2 99%   BMI 33.06 kg/m   Gen: NAD, conversing comfortably Right ear with impacted cerumen, no inflammation in the canal. Pulm: NWOB, CTAB with no crackles, wheezes, or rhonchi MSK: no edema, cyanosis, or clubbing noted Skin: warm, dry Neuro: grossly normal, moves all extremities Psych: Normal affect and thought content  No results found for this or any previous visit (from the past 72 hour(s)).   Assessment/Plan:  Anxiety Patient still with anxiety problems, would not like SSRI because she says that she is use them in the past and they do not work.  We do have conflict with prescribing on Xanax given that  she has a chronic narcotic medication use.  Do not suspect illicit purposes but with this complicated patient we have offered psychiatric referral.  Patient wants time to look around town to find one that she wants and will then call back in to get referral placed.  Allergic rhinitis Patient having some ear congestion, did have impacted cerumen so we will have that cleared up by the nurse.  No indication at this time of otitis media or externa.  Said that she stopped using cetirizine in the past because she was getting overly dried out.  She was on 10 daily.  We discussed using 5 mg daily and potentially even moving to every other day if she gets too dried out.  Will attempt to find a balance between overly drying on her membranes and staying congested.   Sherene Sires, DO FAMILY MEDICINE RESIDENT - PGY2 02/22/2019 1:59 PM

## 2019-02-22 NOTE — Assessment & Plan Note (Signed)
Patient still with anxiety problems, would not like SSRI because she says that she is use them in the past and they do not work.  We do have conflict with prescribing on Xanax given that she has a chronic narcotic medication use.  Do not suspect illicit purposes but with this complicated patient we have offered psychiatric referral.  Patient wants time to look around town to find one that she wants and will then call back in to get referral placed.

## 2019-02-22 NOTE — Addendum Note (Signed)
Addended by: Owens Shark, CARINA on: 02/22/2019 06:49 PM   Modules accepted: Level of Service

## 2019-02-22 NOTE — Assessment & Plan Note (Signed)
Patient having some ear congestion, did have impacted cerumen so we will have that cleared up by the nurse.  No indication at this time of otitis media or externa.  Said that she stopped using cetirizine in the past because she was getting overly dried out.  She was on 10 daily.  We discussed using 5 mg daily and potentially even moving to every other day if she gets too dried out.  Will attempt to find a balance between overly drying on her membranes and staying congested.

## 2019-02-28 DIAGNOSIS — F419 Anxiety disorder, unspecified: Secondary | ICD-10-CM | POA: Diagnosis not present

## 2019-02-28 DIAGNOSIS — Z6833 Body mass index (BMI) 33.0-33.9, adult: Secondary | ICD-10-CM | POA: Diagnosis not present

## 2019-03-22 ENCOUNTER — Telehealth: Payer: Self-pay | Admitting: Family Medicine

## 2019-03-22 DIAGNOSIS — F419 Anxiety disorder, unspecified: Secondary | ICD-10-CM

## 2019-03-22 NOTE — Telephone Encounter (Signed)
Contacted pt about this and she stated that she has not been able to find a place where she would like to do counseling so she asked that you go ahead and place the referral and she will go to whoever she is referred to,this was discussed at her last visit.  Routing to doctor who saw pt. Alliyah Roesler Zimmerman Rumple, CMA

## 2019-03-22 NOTE — Telephone Encounter (Signed)
Per patient came in today she stated last doctor she saw was Dr Criss Rosales he was suppose to give her referral for counseling.  Saw Dr Criss Rosales in 06/20.  Patient has up coming appt on Friday 03/25/19 with Dr. Ouida Sills for acid reflux and sinus problem.  Please call patient about referral her phone [phone]

## 2019-03-25 ENCOUNTER — Ambulatory Visit: Payer: Medicaid Other | Admitting: Family Medicine

## 2019-04-01 ENCOUNTER — Encounter: Payer: Self-pay | Admitting: Family Medicine

## 2019-04-01 ENCOUNTER — Ambulatory Visit: Payer: Medicaid Other | Admitting: Family Medicine

## 2019-04-01 ENCOUNTER — Other Ambulatory Visit: Payer: Self-pay

## 2019-04-01 DIAGNOSIS — R140 Abdominal distension (gaseous): Secondary | ICD-10-CM | POA: Diagnosis not present

## 2019-04-01 DIAGNOSIS — R0981 Nasal congestion: Secondary | ICD-10-CM

## 2019-04-01 DIAGNOSIS — R198 Other specified symptoms and signs involving the digestive system and abdomen: Secondary | ICD-10-CM | POA: Insufficient documentation

## 2019-04-01 HISTORY — DX: Nasal congestion: R09.81

## 2019-04-01 HISTORY — DX: Abdominal distension (gaseous): R14.0

## 2019-04-01 NOTE — Assessment & Plan Note (Signed)
Patient with 2-week history of nasal congestion and runny nose, feeling better today.  Has been treating symptoms with saline nasal spray and fluticasone nasal spray.  Patient afebrile today's visit, no other signs concerning for current sinus infection. -Patient encouraged to use saline spray and fluticasone regularly

## 2019-04-01 NOTE — Progress Notes (Signed)
   Subjective:    Patient ID: Mariah [name], female    DOB: January 07, 1961, 58 y.o.   MRN: [id number]   CC: Sinus pressure, "reflux"  HPI: Sinus pressure: The patient reports 2 weeks of nasal congestion, waking up with stopped up nose on the left side more than the right side, she reports having to blow her nose a lot which is only occasionally productive for mucus.  She has used saline nasal spray and reports only occasionally using her fluticasone.  She denies fevers, shortness of breath, myalgias, sneezing and coughing.  She says she is doing better today, but would like to get the problem checked out to make sure she is not having an infection.  Feeling bloated: Patient reports feeling full and bloated after eating.  She says she has a sensation that she has to burp but cannot.  Stools have been normal, stooling 2-3 times daily.  Denies nausea, vomiting, diarrhea.  The patient is lactose intolerant.  She reports that she loves [place] and [place] cheeses, as well as [place] yogurt.  Review of Systems: See HPI   Objective:  BP 120/60   Pulse 61   Ht 5\' 6"  (1.676 m)   Wt 203 lb 6 oz (92.3 kg)   SpO2 99%   BMI 32.83 kg/m  Vitals and nursing note reviewed  General: well nourished, in no acute distress, very pleasant patient HEENT: normocephalic, TM's visualized bilaterally with wax in left ear, no nasal discharge but bilateral erythema and edema is appreciated in the nares, moist mucous membranes, good dentition without erythema in posterior oropharynx, minimal cobblestoning noted to posterior oropharynx. Neck: supple, non-tender, without lymphadenopathy Cardiac: RRR, clear S1 and S2, no murmurs, rubs, or gallops Respiratory: CTA B, no increased work of breathing Abdomen: soft, nontender, nondistended, no sounds present throughout  Assessment & Plan:    Nasal congestion Patient with 2-week history of nasal congestion and runny nose, feeling better today.  Has been treating symptoms  with saline nasal spray and fluticasone nasal spray.  Patient afebrile today's visit, no other signs concerning for current sinus infection. -Patient encouraged to use saline spray and fluticasone regularly  Postprandial bloating Patient has recently been experiencing bloating after eating.  She is lactose intolerant; however, has been incorporating milk products into her diet. -Patient encouraged to avoid dairy products for her to find dairy free substitutes to consume -Patient given list of fiber rich, healthy-fat rich foods, as well as sources of natural probiotics per her request   Return if symptoms worsen or fail to improve.   Dr. Milus Banister [facility], PGY-1

## 2019-04-01 NOTE — Assessment & Plan Note (Signed)
Patient has recently been experiencing bloating after eating.  She is lactose intolerant; however, has been incorporating milk products into her diet. -Patient encouraged to avoid dairy products for her to find dairy free substitutes to consume -Patient given list of fiber rich, healthy-fat rich foods, as well as sources of natural probiotics per her request

## 2019-04-01 NOTE — Patient Instructions (Signed)
Thank you for coming in to see [place] today! Please see below to review our plan for today's visit:  1. Try a probiotic like Align, FloraStor, or Culturelle, then follow with Kimchi, Sauerkraut, or Kombucha. 2. Reach out to a counselor!  Please call the clinic at [phone] if your symptoms worsen or you have any concerns. It was our pleasure to serve you!    Dr. Milus Banister [facility]  Psychiatry and [place], [place] [phone]  Psychiatrists [facility] [address] [place], [place] [address] [place], [place] [address] [phone]  Dr. Norma Fredrickson [facility] [address] [place] [address] [place] [address] [phone]  Sheralyn Boatman, [place] [address] [place] [address] [place] [address] [phone]  Therapists [facility] [address] [place], [place] [phone]  [facility] [address] [place] [address] [place], [place] [phone]  [facility] [address] [place], [place] [address] [place], [place] [address] [phone]  [facility] [address] [place], [place] [address] [place], [place] [address] [phone]  Fatty food sources: Chia seed, avocado, whole eggs, fatty fish (salmon, mackerel, trout, mackerel, sardines and herring), extra virgin olive oil, nuts, coconut oil.     FOODS WITH FIBERS Fruits               Serving size Total fiber (grams)* Raspberries  1 cup  8.0 Pear   1 medium 5.5 Apple, with skin 1 medium 4.5 Banana  1 medium 3.0 Orange  1 medium 3.0 Strawberries  1 cup 3.0  Vegetables  Serving size Total fiber (grams)* Green peas, boiled 1 cup  9.0 Broccoli, boiled 1 cup chopped5.0 Turnip greens, boiled 1 cup  5.0 Brussels sprouts, boiled 1 cup  4.0 Potato, with skin, baked 1 medium 4.0 Sweet corn, boiled 1 cup  3.5 Cauliflower, raw 1 cup chopped 2.0 Carrot, raw  1 medium 1.5  Grains   Serving size Total fiber (grams)* Spaghetti, whole-wheat, cooked 1 cup 6.0 Barley, pearled, cooked 1 cup 6.0 Bran flakes 3/4 cup  5.5 Quinoa, cooked 1 cup  5.0 Oat bran muffin 1 medium 5.0 Oatmeal, instant, cooked 1 cup 5.0 Popcorn, air-popped 3 cups  3.5 Brown rice, cooked 1 cup  3.5 Bread, whole-wheat 1 slice  2.0 Bread, rye  1 slice  2.0  Legumes, nuts and seeds Serving size Total fiber (grams)* Split peas, boiled  1 cup  16.0 Lentils, boiled   1 cup  15.5 Black beans, boiled  1 cup  15.0 Baked beans, canned  1 cup  10.0 Chia seeds   1 ounce 10.0 Almonds   1 ounce (23 nuts) 3.5 Pistachios   1 ounce (49 nuts) 3.0 Sunflower kernels  1 ounce 3.0 *Rounded to nearest 0.5 gram. Source: [employer] for [employer]

## 2019-04-12 DIAGNOSIS — M545 Low back pain: Secondary | ICD-10-CM | POA: Diagnosis not present

## 2019-04-20 ENCOUNTER — Telehealth: Payer: Self-pay | Admitting: Family Medicine

## 2019-04-20 NOTE — Telephone Encounter (Signed)
Pt has apt for 08-07 at 11, but she wanted to see if she could get a referral for an ear,nose, and throat specialist. Please give pt a call back.

## 2019-04-20 NOTE — Telephone Encounter (Signed)
Will forward to MD to advise. [name],CMA  

## 2019-04-21 ENCOUNTER — Other Ambulatory Visit: Payer: Self-pay | Admitting: Family Medicine

## 2019-04-21 DIAGNOSIS — K219 Gastro-esophageal reflux disease without esophagitis: Secondary | ICD-10-CM

## 2019-04-21 DIAGNOSIS — F419 Anxiety disorder, unspecified: Secondary | ICD-10-CM

## 2019-04-21 DIAGNOSIS — R0981 Nasal congestion: Secondary | ICD-10-CM

## 2019-04-21 MED ORDER — ESCITALOPRAM OXALATE 5 MG PO TABS: 5.0000 mg | ORAL_TABLET | Freq: Every day | ORAL | 0 refills | Status: DC

## 2019-04-21 NOTE — Telephone Encounter (Signed)
I call the patient and spoke with her on the phone, she is having worsening sinus pressure, abdominal pain and worsening anxiety. I sent in referral to ENT for her chronic sinus issues (back to doc who saw her in March 2018), started her on med for anxiety and encouraged her to follow up with her new counselor.  She is scheduled for a visit 8/28.   Milus Banister, [place], PGY-2 04/21/2019 12:45 PM

## 2019-04-22 ENCOUNTER — Ambulatory Visit: Payer: Medicaid Other | Admitting: Family Medicine

## 2019-05-13 ENCOUNTER — Encounter: Payer: Self-pay | Admitting: Gastroenterology

## 2019-05-13 ENCOUNTER — Telehealth (INDEPENDENT_AMBULATORY_CARE_PROVIDER_SITE_OTHER): Payer: Medicaid Other | Admitting: Family Medicine

## 2019-05-13 ENCOUNTER — Encounter: Payer: Self-pay | Admitting: Family Medicine

## 2019-05-13 ENCOUNTER — Other Ambulatory Visit: Payer: Self-pay | Admitting: Family Medicine

## 2019-05-13 ENCOUNTER — Other Ambulatory Visit: Payer: Self-pay

## 2019-05-13 VITALS — Wt 203.0 lb

## 2019-05-13 DIAGNOSIS — F419 Anxiety disorder, unspecified: Secondary | ICD-10-CM

## 2019-05-13 DIAGNOSIS — R140 Abdominal distension (gaseous): Secondary | ICD-10-CM

## 2019-05-13 DIAGNOSIS — Z1231 Encounter for screening mammogram for malignant neoplasm of breast: Secondary | ICD-10-CM

## 2019-05-13 NOTE — Progress Notes (Signed)
[place] Telemedicine Visit  Patient consented to have virtual visit. Method of visit: Telephone  Encounter participants: Patient: Mariah [name] - located at home, [phone] (m), [phone] (h) Provider: [name] - located at clinic Others (if applicable): none  Chief Complaint: anxiety, GI referral, f/u ENT referral  HPI: ENT referral follow-up: At last doctor's appointment, patient was referred for ENT but has yet to hear back from a doctor's office or about an appointment.  She is calling today to ask about the status of the referral. Records indicated patient was referred to [facility] E[facility], patient was given phone number to contact the office about an appointment.  Postprandial bloating: Patient continues to have postprandial bloating.  She is no longer using ranitidine due to it being recalled, and is hesitant to use famotidine or other antacid medication.  Patient has a history of lactose intolerance and admitted to frequently consuming dairy products.  Patient was educated on what foods contain dairy and which ones to avoid.  She would still like to be referred to a gastroenterologist.  She denies nausea, vomiting, dark stools, and bloody stools.  Anxiety: Patient does have a history of anxiety for which he used to use Xanax.  Currently, the patient is taking 10 mg Percocet up to 4 times daily for chronic pain.  At previous appointments patient has to use Xanax for anxiety because she "likes the instant gratification".  Patient was prescribed Lexapro, she states she took 1 pill and it felt like it did not do anything for her.  It was explained to the patient that Lexapro has to "build up in the system" and does not provide the instant gratification like Xanax.  Patient was encouraged to take the Lexapro daily for improvement of her anxiety.  Patient has yet to reach out to a counselor/psychiatrist for help with her anxiety, and was encouraged to do  so.  She has already received a list of counselors/therapist/psychiatrist in our area.  ROS: per HPI  Pertinent PMHx: anxiety,   Exam:  Respiratory: normal work of breathing  Assessment/Plan: Postprandial bloating Patient continuing to have postprandial bloating, desires GI referral. -Ambulatory referral to GI placed  Anxiety Patient states she has not been taking the Lexapro regularly for anxiety.  Has also not yet reached out to a psychiatrist/counselor. -Patient encouraged to take Lexapro daily -Patient encouraged to reach out to counselor/psychiatrist, referral already placed, patient has list of local counselors, therapist, and psychiatrists    Time spent during visit with patient: 08:05 minutes  Milus Banister, [place], PGY-2 05/13/2019 9:14 AM

## 2019-05-13 NOTE — Assessment & Plan Note (Signed)
Patient states she has not been taking the Lexapro regularly for anxiety.  Has also not yet reached out to a psychiatrist/counselor. -Patient encouraged to take Lexapro daily -Patient encouraged to reach out to counselor/psychiatrist, referral already placed, patient has list of local counselors, therapist, and psychiatrists

## 2019-05-13 NOTE — Assessment & Plan Note (Signed)
Patient continuing to have postprandial bloating, desires GI referral. -Ambulatory referral to GI placed

## 2019-06-13 DIAGNOSIS — M545 Low back pain: Secondary | ICD-10-CM | POA: Diagnosis not present

## 2019-06-14 ENCOUNTER — Other Ambulatory Visit: Payer: Self-pay | Admitting: Family Medicine

## 2019-06-15 ENCOUNTER — Ambulatory Visit (INDEPENDENT_AMBULATORY_CARE_PROVIDER_SITE_OTHER): Payer: Medicaid Other | Admitting: Gastroenterology

## 2019-06-15 ENCOUNTER — Encounter: Payer: Self-pay | Admitting: Gastroenterology

## 2019-06-15 VITALS — HR 67 | Temp 98.2°F | Ht 66.0 in | Wt 211.4 lb

## 2019-06-15 DIAGNOSIS — Z7189 Other specified counseling: Secondary | ICD-10-CM

## 2019-06-15 DIAGNOSIS — R1013 Epigastric pain: Secondary | ICD-10-CM | POA: Diagnosis not present

## 2019-06-15 DIAGNOSIS — R140 Abdominal distension (gaseous): Secondary | ICD-10-CM

## 2019-06-15 DIAGNOSIS — G459 Transient cerebral ischemic attack, unspecified: Secondary | ICD-10-CM | POA: Diagnosis not present

## 2019-06-15 DIAGNOSIS — Z1211 Encounter for screening for malignant neoplasm of colon: Secondary | ICD-10-CM | POA: Diagnosis not present

## 2019-06-15 DIAGNOSIS — K219 Gastro-esophageal reflux disease without esophagitis: Secondary | ICD-10-CM

## 2019-06-15 MED ORDER — NA SULFATE-K SULFATE-MG SULF 17.5-3.13-1.6 GM/177ML PO SOLN: 1.0000 | Freq: Once | ORAL | 0 refills | Status: AC

## 2019-06-15 NOTE — Progress Notes (Signed)
Chief Complaint: Bloating, GERD  Referring Provider:     Dollene Cleveland, DO; Leveda Anna Santiago Bumpers, MD  HPI:    Mariah [name] is a 58 y.o. female with a history of anxiety, CVA 2018, HTN, HLD, cervical/lumbar spine surgery, referred to the Gastroenterology Clinic for evaluation of reflux symptoms and abdominal bloating.  She states she has postprandial bloating. Occurs near daily. Reports a history of lactose intolerance but continues to consume dairy products (cheese). No changes in stool. Weight stable.   Does have intermittent HB, regurgitation, and increased belching. Worse with fried foods. Usually post prandial. 1 day/week for last couple of years. Has a prior hx of reflux, previously treated with Zantac prn. but stopped due to recall and was hesitant to start any new H2 RA or PPI. Will use tums, rolaids prn.   Also endorses MEG pain, which predominantly occurs at night, waking her from sleep. Resolves with small amount of food. Non-radiating. No associated n/v/f/c/d/c. Started approx 3 years ago. No exacerbating factors. Occurs approx 2 nights/week. Rarely during daytime.   Has not tried to keep diet diary to identify provoking factors for any of her above UGI sxs.   Normal BMP and CBC in 09/2018.  No recent abdominal imaging for review.  No previous EGD or colonoscopy.  No known family history of CRC, GI malignancy, liver disease, pancreatic disease, or IBD.   Past Medical History:  Diagnosis Date  . Degenerative disc disease, lumbar   . GERD (gastroesophageal reflux disease)   . Hypertension   . Mitral valve prolapse   . Seizures (HCC) 06/23/2017     Past Surgical History:  Procedure Laterality Date  . ABDOMINAL HYSTERECTOMY    . ABDOMINAL HYSTERECTOMY    . BACK SURGERY    . CHOLECYSTECTOMY    . KNEE SURGERY     Family History  Problem Relation Age of Onset  . Lymphoma Mother   . Lung cancer Father   . Pulmonary embolism Brother   . Colon  cancer Neg Hx   . Esophageal cancer Neg Hx   . Liver cancer Neg Hx   . Pancreatic cancer Neg Hx   . Rectal cancer Neg Hx   . Stomach cancer Neg Hx    Social History   Tobacco Use  . Smoking status: Former Smoker    Quit date: 09/16/1995    Years since quitting: 23.7  . Smokeless tobacco: Never Used  Substance Use Topics  . Alcohol use: Yes  . Drug use: Yes    Types: Marijuana   Current Outpatient Medications  Medication Sig Dispense Refill  . atenolol (TENORMIN) 50 MG tablet TAKE 1 TABLET BY MOUTH EVERY DAY 90 tablet 3  . atorvastatin (LIPITOR) 40 MG tablet Take 1 tablet (40 mg total) by mouth daily. 90 tablet 3  . clopidogrel (PLAVIX) 75 MG tablet TAKE 1 TABLET(75 MG) BY MOUTH DAILY 90 tablet 3  . fluticasone (FLONASE) 50 MCG/ACT nasal spray INSTILL 2 SPRAY IN EACH NOSTRIL EVERY DAY 16 g 11  . hydrochlorothiazide (HYDRODIURIL) 25 MG tablet TAKE 1 TABLET(25 MG) BY MOUTH DAILY 90 tablet 3  . oxyCODONE-acetaminophen (PERCOCET) 10-325 MG tablet Take 1 tablet by mouth every 6 (six) hours as needed.  0   No current facility-administered medications for this visit.    Allergies  Allergen Reactions  . Lisinopril     REACTION: COUGH  . Moxifloxacin  REACTION: N \\T \ V  . Sulfonamide Derivatives Other (See Comments)    Pt cannot remember rxn     Review of Systems: All systems reviewed and negative except where noted in HPI.     Physical Exam:    Wt Readings from Last 3 Encounters:  06/15/19 211 lb 6 oz (95.9 kg)  05/13/19 203 lb (92.1 kg)  04/01/19 203 lb 6 oz (92.3 kg)    Pulse 67   Temp 98.2 F (36.8 C) (Oral)   Ht 5\' 6"  (1.676 m)   Wt 211 lb 6 oz (95.9 kg)   BMI 34.12 kg/m  Constitutional:  Pleasant, in no acute distress. Psychiatric: Normal mood and affect. Behavior is normal. EENT: Pupils normal.  Conjunctivae are normal. No scleral icterus. Neck supple. No cervical LAD. Cardiovascular: Normal rate, regular rhythm. No edema Pulmonary/chest: Effort normal  and breath sounds normal. No wheezing, rales or rhonchi. Abdominal: Soft, nondistended, nontender. Bowel sounds active throughout. There are no masses palpable. No hepatomegaly. Neurological: Alert and oriented to person place and time. Skin: Skin is warm and dry. No rashes noted.   ASSESSMENT AND PLAN;   1) Bloating 2) MEG pain 3) GERD - Provided with low FODMAP diet with detailed explanation on implementing -Keep diet diary - EGD with random and directed gastric/duodenal biopsies -Evaluate for LES laxity, erosive esophagitis, hiatal hernia time of EGD -Discussed restarting acid suppression therapy for diagnostic/therapeutic intent.  She prefers to hold off on any new medications in favor of endoscopic evaluation as above -Resume antireflux lifestyle/dietary modifications - Can consider lactose intolerance testing pending response to above plan  4) CRC screening -Due for age-appropriate, average risk CRC screening -Schedule colonoscopy  5) History of CVA 6) Systemic antiplatelet therapy - Discussed antiplatelet therapy at length today.  Given risk/benefit profile, plan to resume Plavix through perioperative period  The indications, risks, and benefits of EGD and colonoscopy were explained to the patient in detail. Risks include but are not limited to bleeding, perforation, adverse reaction to medications, and cardiopulmonary compromise. Sequelae include but are not limited to the possibility of surgery, hositalization, and mortality. The patient verbalized understanding and wished to proceed. All questions answered, referred to scheduler and bowel prep ordered. Further recommendations pending results of the exam.    Lavena Bullion, DO, FACG  06/15/2019, 1:42 PM   Hensel, Jamal Collin, MD

## 2019-06-15 NOTE — Patient Instructions (Addendum)
You have been scheduled for an endoscopy and colonoscopy. Please follow the written instructions given to you at your visit today. Please pick up your prep supplies at the pharmacy within the next 1-3 days. If you use inhalers (even only as needed), please bring them with you on the day of your procedure.  

## 2019-06-17 ENCOUNTER — Ambulatory Visit: Payer: Medicaid Other | Admitting: Gastroenterology

## 2019-06-24 DIAGNOSIS — R252 Cramp and spasm: Secondary | ICD-10-CM | POA: Insufficient documentation

## 2019-06-24 HISTORY — DX: Cramp and spasm: R25.2

## 2019-06-28 ENCOUNTER — Encounter: Payer: Self-pay | Admitting: Family Medicine

## 2019-06-28 ENCOUNTER — Ambulatory Visit
Admission: RE | Admit: 2019-06-28 | Discharge: 2019-06-28 | Disposition: A | Discharge status: Home / Self Care | Payer: Medicaid Other | Source: Ambulatory Visit | Attending: *Deleted | Admitting: *Deleted

## 2019-06-28 ENCOUNTER — Ambulatory Visit (INDEPENDENT_AMBULATORY_CARE_PROVIDER_SITE_OTHER): Payer: Medicaid Other | Admitting: Family Medicine

## 2019-06-28 ENCOUNTER — Other Ambulatory Visit: Payer: Self-pay

## 2019-06-28 VITALS — BP 138/82 | HR 53 | Wt 212.8 lb

## 2019-06-28 DIAGNOSIS — R252 Cramp and spasm: Secondary | ICD-10-CM | POA: Diagnosis not present

## 2019-06-28 DIAGNOSIS — L853 Xerosis cutis: Secondary | ICD-10-CM

## 2019-06-28 DIAGNOSIS — Z1231 Encounter for screening mammogram for malignant neoplasm of breast: Secondary | ICD-10-CM | POA: Diagnosis not present

## 2019-06-28 NOTE — Patient Instructions (Signed)
Thank you for coming in to see [place] today! Please see below to review our plan for today's visit:  Please call the clinic at [phone] if your symptoms worsen or you have any concerns. It was our pleasure to serve you!  Dr. Peggyann Shoals [facility]   Muscle Cramps and Spasms Muscle cramps and spasms are when muscles tighten by themselves. They usually get better within minutes. Muscle cramps are painful. They are usually stronger and last longer than muscle spasms. Muscle spasms may or may not be painful. They can last a few seconds or much longer. Cramps and spasms can affect any muscle, but they occur most often in the calf muscles of the leg. They are usually not caused by a serious problem. In many cases, the cause is not known. Some common causes include:  Doing more physical work or exercise than your body is ready for.  Using the muscles too much (overuse) by repeating certain movements too many times.  Staying in a certain position for a long time.  Playing a sport or doing an activity without preparing properly.  Using bad form or technique while playing a sport or doing an activity.  Not having enough water in your body (dehydration).  Injury.  Side effects of some medicines.  Low levels of the salts and minerals in your blood (electrolytes), such as low potassium or calcium. Follow these instructions at home: Managing pain and stiffness      Massage, stretch, and relax the muscle. Do this for many minutes at a time.  If told, put heat on tight or tense muscles as often as told by your doctor. Use the heat source that your doctor recommends, such as a moist heat pack or a heating pad. ? Place a towel between your skin and the heat source. ? Leave the heat on for 20-30 minutes. ? Remove the heat if your skin turns bright red. This is very important if you are not able to feel pain, heat, or cold. You may have a greater risk of getting burned.   If told, put ice on the affected area. This may help if you are sore or have pain after a cramp or spasm. ? Put ice in a plastic bag. ? Place a towel between your skin and the bag. ? Leave the ice on for 20 minutes, 2-3 times a day.  Try taking hot showers or baths to help relax tight muscles. Eating and drinking  Drink enough fluid to keep your pee (urine) pale yellow.  Eat a healthy diet to help ensure that your muscles work well. This should include: ? Fruits and vegetables. ? Lean protein. ? Whole grains. ? Low-fat or nonfat dairy products. General instructions  If you are having cramps often, avoid intense exercise for several days.  Take over-the-counter and prescription medicines only as told by your doctor.  Watch for any changes in your symptoms.  Keep all follow-up visits as told by your doctor. This is important. Contact a doctor if:  Your cramps or spasms get worse or happen more often.  Your cramps or spasms do not get better with time. Summary  Muscle cramps and spasms are when muscles tighten by themselves. They usually get better within minutes.  Cramps and spasms occur most often in the calf muscles of the leg.  Massage, stretch, and relax the muscle. This may help the cramp or spasm go away.  Drink enough fluid to keep your pee (urine)  pale yellow. This information is not intended to replace advice given to you by your health care provider. Make sure you discuss any questions you have with your health care provider. Document Released: 08/14/2008 Document Revised: 01/25/2018 Document Reviewed: 01/25/2018 Elsevier Patient Education  2020 [employer].

## 2019-06-30 NOTE — Progress Notes (Signed)
Subjective:    Patient ID: Mariah [name], female    DOB: 19-Jul-1961, 58 y.o.   MRN: [id number]   CC: left arm spasm  HPI:  Left arm spasm: Patient is a pleasant 58 year old female presenting for concerns of a spasm in her left arm.  Patient states the spasm occurred 4 days ago in her left arm in the triceps area and into her armpit.  Patient states the experience was like having "a charley horse of her left arm".  Patient states it lasted about 6 minutes and went away on its own.  She has been working out some recently, including exercises of her upper extremities.  Patient denies ever having this before.  She denies having any fevers or shortness of breath.  Patient denies any chest pain at the time of the event, as well as denies nausea, vomiting, and abdominal pain.  She denies any focal neurological deficits, changes in sensation or motor function since the left arm spasm.  It has not occurred since then.  Dry, aging skin: Patient also expresses concern regarding her aging skin.  She has been experiencing more dryness than usual recently despite efforts to keep her skin hydrated.  She is requesting referral to dermatology for counseling on her skin and how to best supportive that she continues to age.  No concerning lesions or evidence of infection appreciated on physical exam.  Smoking status reviewed -previous smoker  Review of Systems-see HPI   Objective:  BP 138/82   Pulse (!) 53   Wt 212 lb 12.8 oz (96.5 kg)   SpO2 99%   BMI 34.35 kg/m  Vitals and nursing note reviewed  General: well nourished, in no acute distress Neck: Supple, nontender to palpation, no lymphadenopathy appreciated, trachea midline, full symmetrical range of motion appreciated Cardiac: RRR, clear S1 and S2, no murmurs appreciated Respiratory: CTA bilaterally, normal work of breathing Extremities: Brisk capillary refill to bilateral upper extremities, 2+ radial pulses appreciated bilaterally, no deformity  or ecchymosis Left Shoulder: -Inspection reveals no obvious deformity, atrophy, or asymmetry. No bruising. No swelling -Palpation is normal with no TTP over [facility] joint or bicipital groove. -Full ROM in flexion, abduction, internal/external rotation -NV intact distally -Normal scapular function observed. -Strength: 5/5 strength appreciated with resisted flexion, extension, internal and external rotation Neuro: alert and oriented, no focal deficits, full sensation appreciated to bilateral upper extremities  Assessment & Plan:   Cramp of muscle of left upper extremity Patient experienced cramp to left tricep muscle, as well as possible involvement of teres minor, teres major, and left latissimus dorsi.  Cramp lasted approximately 6 minutes.  Patient denied having any chest pain, shortness of breath, nausea, or abdominal pain concerning for cardiac etiology.  Patient is taking HCTZ for blood pressure as well as statin for dyslipidemia.  Patient has been taking these medications long-term, doubtful they are contributing to current symptoms. -Patient encouraged to keep hydrated, given information about muscle cramps and spasms -Patient encouraged to contact the clinic should she have another episode -Patient instructed to seek help should she have left arm pain with or without chest pain, jaw pain, or abdominal symptoms. -Has neurological exam is full, intact, and symmetrical, will continue to monitor symptoms, patient reassured  Dry skin: Referral to dermatology placed.  Told patient that because of her insurance coverage may not get a speedy referral or any referral at all.  Encouraged her to also seek guidance from skin care specialist at a spa.  Return if  symptoms worsen or fail to improve.   Dr. Peggyann Shoals [facility], PGY-2

## 2019-06-30 NOTE — Assessment & Plan Note (Signed)
Patient experienced cramp to left tricep muscle, as well as possible involvement of teres minor, teres major, and left latissimus dorsi.  Cramp lasted approximately 6 minutes.  Patient denied having any chest pain, shortness of breath, nausea, or abdominal pain concerning for cardiac etiology.  Patient is taking HCTZ for blood pressure as well as statin for dyslipidemia.  Patient has been taking these medications long-term, doubtful they are contributing to current symptoms. -Patient encouraged to keep hydrated, given information about muscle cramps and spasms -Patient encouraged to contact the clinic should she have another episode -Patient instructed to seek help should she have left arm pain with or without chest pain, jaw pain, or abdominal symptoms. -Has neurological exam is full, intact, and symmetrical, will continue to monitor symptoms, patient reassured

## 2019-07-01 ENCOUNTER — Telehealth: Payer: Self-pay | Admitting: Family Medicine

## 2019-07-01 NOTE — Telephone Encounter (Signed)
Patient has a question about the referral put in for an ENT doctor last month.  Please call her back at [phone].

## 2019-07-07 DIAGNOSIS — J310 Chronic rhinitis: Secondary | ICD-10-CM | POA: Diagnosis not present

## 2019-07-07 DIAGNOSIS — J343 Hypertrophy of nasal turbinates: Secondary | ICD-10-CM | POA: Insufficient documentation

## 2019-07-26 ENCOUNTER — Encounter: Payer: Medicaid Other | Admitting: Gastroenterology

## 2019-08-19 ENCOUNTER — Ambulatory Visit (INDEPENDENT_AMBULATORY_CARE_PROVIDER_SITE_OTHER): Payer: Medicaid Other | Admitting: Family Medicine

## 2019-08-19 ENCOUNTER — Other Ambulatory Visit: Payer: Self-pay | Admitting: Family Medicine

## 2019-08-19 ENCOUNTER — Encounter: Payer: Self-pay | Admitting: Family Medicine

## 2019-08-19 ENCOUNTER — Other Ambulatory Visit: Payer: Self-pay

## 2019-08-19 VITALS — BP 144/72 | HR 54 | Wt 209.8 lb

## 2019-08-19 DIAGNOSIS — I10 Essential (primary) hypertension: Secondary | ICD-10-CM | POA: Diagnosis not present

## 2019-08-19 DIAGNOSIS — H53452 Other localized visual field defect, left eye: Secondary | ICD-10-CM

## 2019-08-19 DIAGNOSIS — G453 Amaurosis fugax: Secondary | ICD-10-CM

## 2019-08-19 HISTORY — DX: Other localized visual field defect, left eye: H53.452

## 2019-08-19 MED ORDER — LOSARTAN POTASSIUM 25 MG PO TABS: 25.0000 mg | ORAL_TABLET | Freq: Every day | ORAL | 3 refills | Status: DC

## 2019-08-19 NOTE — Assessment & Plan Note (Signed)
Blood pressure today 144/72.  Patient denies any headaches, current vision changes, chest pain, shortness of breath, abdominal pain.  Pulse today is low at 54.  However, patient is also taking atenolol 50 mg.  Used to take lisinopril for blood pressure but was discontinued due to cough. -Continue HCTZ 25 mg daily -We will taper off of atenolol 50 mg, 25 mg for 7 days, then patient instructed to stop taking atenolol -We will prescribe losartan 25 mg to obtain better control of blood pressure, to prevent decreased heart rate, and additional benefit of kidney protection in this patient with hypertension and prediabetes.

## 2019-08-19 NOTE — Patient Instructions (Addendum)
Thank you for coming in to see [place] today! Please see below to review our plan for today's visit:  1. Take Atenolol 25mg  (1/2 tablet) for the next 7 days. After 7 days, stop taking the Atenolol. Take Losartan 25mg  daily starting now! 2. Keep going with your squat challenge! 3. I will reach out to cardiology about Plavix and Aspirin therapy.   Please call the clinic at [phone] if your symptoms worsen or you have any concerns. It was our pleasure to serve you!  Dr. Milus Banister [facility]

## 2019-08-19 NOTE — Assessment & Plan Note (Signed)
>>  ASSESSMENT AND PLAN FOR HYPERTENSION, BENIGN WRITTEN ON 08/19/2019 12:29 PM BY Dareen Piano, HANNAH C, DO  Blood pressure today 144/72.  Patient denies any headaches, current vision changes, chest pain, shortness of breath, abdominal pain.  Pulse today is low at 54.  However, patient is also taking atenolol 50 mg.  Used to take lisinopril for blood pressure but was discontinued due to cough. -Continue HCTZ 25 mg daily -We will taper off of atenolol 50 mg, 25 mg for 7 days, then patient instructed to stop taking atenolol -We will prescribe losartan 25 mg to obtain better control of blood pressure, to prevent decreased heart rate, and additional benefit of kidney protection in this patient with hypertension and prediabetes.

## 2019-08-19 NOTE — Progress Notes (Signed)
Subjective:    Patient ID: Mariah [name], female    DOB: January 04, 1961, 58 y.o.   MRN: [id number]   CC: blackout of vision  HPI:  Ms. Tess is a pleasant patient with history of high blood pressure, hyperlipidemia, and TIA in 2018 presenting today for recent history of her vision blacking out in the corner of her left eye.  The incident occurred on Thanksgiving day.  She states she has been in the shower that morning, had just gotten out, and sat down to finish getting ready.  In the corner of her left eye she saw small flashes of light that then went completely dark.  The darkness lasted less than 3 minutes.  At the time she denied having any headache, weakness, chest pain, shortness of breath, nausea, vomiting, abdominal pain.  She called EMS, who checked her vitals and her blood sugar.  Vitals were stable and normal except her blood pressure was slightly elevated at 150/98 (patient states she had not yet taken her blood pressure medicines that morning), and her blood sugar was at 130.  Smoking status reviewed - former smoker  Review of Systems - see HPI   Objective:  BP (!) 144/72   Pulse (!) 54   Wt 209 lb 12.8 oz (95.2 kg)   SpO2 99%   BMI 33.86 kg/m  Vitals and nursing note reviewed  General: well nourished, in no acute distress HEENT: No temporal masses or nodules concerning for arteritis Neck: no LAD Cardiac: RRR, S1S2 appreciated, no murmur, no bruit appreciated bilaterally Respiratory: CTA bilaterally, normal work of breathing Abdomen: Normal bowel sounds Extremities: No edema, 2+ DP pulses Skin: warm and dry, no rashes noted Neuro: No focal deficits, cranial nerves II-XII intact   Assessment & Plan:   Decreased peripheral vision of left eye Patient had transient loss of vision to left eye on 08/11/2019.  Eyesight was regained after fever than 3 minutes.  Was most likely due to repeat TIA versus elevated blood pressure (BP at the time 150/90 mmHg).  Also could  consider amaurosis fugax (although vision loss did not involve whole eye).  No concerning sign for Takayasu arteritis on physical exam.  Could also consider carotid vs Cardiac Emboli (Carotid US in 2018 showed "minimal amount of bilateral intimal thickening and atherosclerotic plaque, R > L, not resulting in a hemodynamically significant stenosis within either internal carotid artery". No chest or back pain concerning for Aortic Dissection, no history of Polycythemia or Hyperviscosity. -Continue taking Atorvastatin 40mg  (would like to increase dose to 80 mg daily, however patient has myalgias already at 40 mg dose) -Continue Plavix 75 mg daily.  Will check with cardiology regarding whether or not patient should add on aspirin. -HCTZ and losartan for blood control, will taper patient off of atenolol.  HYPERTENSION, BENIGN Blood pressure today 144/72.  Patient denies any headaches, current vision changes, chest pain, shortness of breath, abdominal pain.  Pulse today is low at 54.  However, patient is also taking atenolol 50 mg.  Used to take lisinopril for blood pressure but was discontinued due to cough. -Continue HCTZ 25 mg daily -We will taper off of atenolol 50 mg, 25 mg for 7 days, then patient instructed to stop taking atenolol -We will prescribe losartan 25 mg to obtain better control of blood pressure, to prevent decreased heart rate, and additional benefit of kidney protection in this patient with hypertension and prediabetes.  Return in about 8 weeks (around 10/17/2019) for CBC, BMP.ABIs for  history of leg cramping   Dr. Peggyann Shoals [facility], PGY-2

## 2019-08-19 NOTE — Assessment & Plan Note (Signed)
Patient had transient loss of vision to left eye on 08/11/2019.  Eyesight was regained after fever than 3 minutes.  Was most likely due to repeat TIA versus elevated blood pressure (BP at the time 150/90 mmHg).  Also could consider amaurosis fugax (although vision loss did not involve whole eye).  No concerning sign for Takayasu arteritis on physical exam.  Could also consider carotid vs Cardiac Emboli (Carotid US in 2018 showed "minimal amount of bilateral intimal thickening and atherosclerotic plaque, R > L, not resulting in a hemodynamically significant stenosis within either internal carotid artery". No chest or back pain concerning for Aortic Dissection, no history of Polycythemia or Hyperviscosity. -Continue taking Atorvastatin 40mg  (would like to increase dose to 80 mg daily, however patient has myalgias already at 40 mg dose) -Continue Plavix 75 mg daily.  Will check with cardiology regarding whether or not patient should add on aspirin. -HCTZ and losartan for blood control, will taper patient off of atenolol.

## 2019-08-19 NOTE — Progress Notes (Signed)
CONSULT NOTE:  Spoke with on-call Ophthalmologist about the transient monocular temporal hemianopia/amaurosis fugax the patient experienced on Thanksgiving day (08/11/2019). The MD stated since the vision loss was transient it is most likely of a vascular/cardiac etiology. Since the vision returned it would not be of benefit for Ophthalmology to see her as it is most likely not due to glaucoma, retinal detachment, or of other ophthalmologic etiology. No neurologic testing is needed as the vision loss only involved one eye and not both (if it involved both would be suspicious of neurologic etiology). The MD recommended echocardiogram for further work up. We will also order Doppler US of Carotids (last done 2 years prior in October 2018, exam was normal).  Milus Banister, [place], PGY-2 08/19/2019 4:47 PM

## 2019-08-20 ENCOUNTER — Ambulatory Visit (HOSPITAL_COMMUNITY)
Admission: EM | Admit: 2019-08-20 | Discharge: 2019-08-20 | Disposition: A | Discharge status: Home / Self Care | Payer: Medicaid Other | Attending: Emergency Medicine | Admitting: Emergency Medicine

## 2019-08-20 ENCOUNTER — Encounter (HOSPITAL_COMMUNITY): Payer: Self-pay

## 2019-08-20 ENCOUNTER — Other Ambulatory Visit: Payer: Self-pay

## 2019-08-20 DIAGNOSIS — M546 Pain in thoracic spine: Secondary | ICD-10-CM

## 2019-08-20 MED ORDER — IBUPROFEN 800 MG PO TABS: 800.0000 mg | ORAL_TABLET | Freq: Three times a day (TID) | ORAL | 0 refills | Status: DC

## 2019-08-20 MED ORDER — PREDNISONE 10 MG PO TABS: 20.0000 mg | ORAL_TABLET | Freq: Every day | ORAL | 0 refills | Status: DC

## 2019-08-20 MED ORDER — BACLOFEN 10 MG PO TABS: 10.0000 mg | ORAL_TABLET | Freq: Three times a day (TID) | ORAL | 0 refills | Status: DC

## 2019-08-20 NOTE — ED Triage Notes (Signed)
Pt present right side upper back pain. Symptoms started today. Pt states that when she take deep breath or any movement hurts her back

## 2019-08-20 NOTE — Discharge Instructions (Signed)
Advised patient to follow-up with primary care provider Advised patient to take medication as prescribed Return if symptoms get worse

## 2019-08-20 NOTE — ED Provider Notes (Signed)
[facility]    CSN: [id number] Arrival date & time: 08/20/19  1620      History   Chief Complaint Chief Complaint  Patient presents with  . Back Pain    upper back right side    HPI Mariah [name] is a 58 y.o. female.   Mariah [name] 59 years old female presented to the urgent care with a  complaints of of right upper back pain that started this AM.  Sitting down and moving make her pain worse and it does not radiate.  She states she exercised yesterday and she woke up this morning with the symptoms.  She rated her back pain at 10 on a scale of 1-10.  She used icy hot to control her symptoms but it was unsuccessful.  The history is provided by the patient. No language interpreter was used.  Back Pain   Past Medical History:  Diagnosis Date  . Degenerative disc disease, lumbar   . GERD (gastroesophageal reflux disease)   . Hypertension   . Mitral valve prolapse   . Seizures (HCC) 06/23/2017    Patient Active Problem List   Diagnosis Date Noted  . Decreased peripheral vision of left eye 08/19/2019  . Cramp of muscle of left upper extremity 06/24/2019  . Nasal congestion 04/01/2019  . Postprandial bloating 04/01/2019  . Eczema 02/11/2018  . Hyperlipidemia 06/26/2017  . TIA (transient ischemic attack) 06/23/2017  . Prediabetes 10/24/2016  . Health care maintenance 10/24/2016  . Callus of foot 04/29/2016  . Cervical myelopathy (HCC) 12/22/2014  . 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 Degenerative disc disease, lumbar   . Mitral valve prolapse   . Insomnia 07/11/2010  . OBESITY 02/13/2009  . HYPERTENSION, BENIGN 08/07/2008  . Anxiety 02/26/2007  . Allergic rhinitis 02/26/2007  . GERD 02/26/2007    Past Surgical History:  Procedure Laterality Date  . ABDOMINAL HYSTERECTOMY    . ABDOMINAL HYSTERECTOMY    . BACK SURGERY    . CHOLECYSTECTOMY    . KNEE SURGERY      OB History    Gravida  2   Para  2   Term      Preterm      AB      Living        SAB      TAB      Ectopic      Multiple      Live Births               Home Medications    Prior to Admission medications   Medication Sig Start Date End Date Taking? Authorizing Provider  atorvastatin (LIPITOR) 40 MG tablet Take 1 tablet (40 mg total) by mouth daily. 12/27/18   Myrene Buddy, MD  baclofen (LIORESAL) 10 MG tablet Take 1 tablet (10 mg total) by mouth 3 (three) times daily. 08/20/19   Durward Parcel, FNP  clopidogrel (PLAVIX) 75 MG tablet TAKE 1 TABLET(75 MG) BY MOUTH DAILY 09/27/18   Ellwood Dense, DO  fluticasone (FLONASE) 50 MCG/ACT nasal spray INSTILL 2 SPRAY IN EACH NOSTRIL EVERY DAY 12/01/18   Ellwood Dense, DO  hydrochlorothiazide (HYDRODIURIL) 25 MG tablet TAKE 1 TABLET(25 MG) BY MOUTH DAILY 11/08/18   Ellwood Dense, DO  ibuprofen (ADVIL) 800 MG tablet Take 1 tablet (800 mg total) by mouth 3 (three) times daily. 08/20/19   [name], [name], FNP  losartan (COZAAR) 25 MG tablet Take 1 tablet (25 mg total) by mouth at  bedtime. 08/19/19   [name], DO  oxyCODONE-acetaminophen (PERCOCET) 10-325 MG tablet Take 1 tablet by mouth every 6 (six) hours as needed. 04/02/17   [provider]  predniSONE (DELTASONE) 10 MG tablet Take 2 tablets (20 mg total) by mouth daily. 08/20/19   [name], FNP    Family History Family History  Problem Relation Age of Onset  . Lymphoma Mother   . Lung cancer Father   . Pulmonary embolism Brother   . Colon cancer Neg Hx   . Esophageal cancer Neg Hx   . Liver cancer Neg Hx   . Pancreatic cancer Neg Hx   . Rectal cancer Neg Hx   . Stomach cancer Neg Hx     Social History Social History   Tobacco Use  . Smoking status: Former Smoker    Quit date: 09/16/1995    Years since quitting: 23.9  . Smokeless tobacco: Never Used  Substance Use Topics  . Alcohol use: Yes  . Drug use: Yes    Types: Marijuana     Allergies   Lisinopril, Moxifloxacin, and Sulfonamide derivatives    Review of Systems Review of Systems  Constitutional: Negative.   HENT: Negative.   Respiratory: Negative.   Cardiovascular: Negative.   Musculoskeletal: Positive for back pain.  ROS: All other are negatives   Physical Exam Triage Vital Signs ED Triage Vitals  Enc Vitals Group     BP 08/20/19 1700 (!) 144/62     Pulse Rate 08/20/19 1700 61     Resp 08/20/19 1700 18     Temp 08/20/19 1700 98.4 F (36.9 C)     Temp Source 08/20/19 1700 Oral     SpO2 08/20/19 1700 100 %     Weight --      Height --      Head Circumference --      Peak Flow --      Pain Score 08/20/19 1659 10     Pain Loc --      Pain Edu? --      Excl. in [place]? --    No data found.  Updated Vital Signs BP (!) 144/62 (BP Location: Right Arm)   Pulse 61   Temp 98.4 F (36.9 C) (Oral)   Resp 18   SpO2 100%   Visual Acuity Right Eye Distance:   Left Eye Distance:   Bilateral Distance:    Right Eye Near:   Left Eye Near:    Bilateral Near:     Physical Exam Vitals signs and nursing note reviewed.  Constitutional:      General: She is not in acute distress.    Appearance: Normal appearance. She is normal weight. She is not ill-appearing.  Cardiovascular:     Rate and Rhythm: Normal rate.     Pulses: Normal pulses.     Heart sounds: No murmur.  Pulmonary:     Effort: Pulmonary effort is normal. No respiratory distress.     Breath sounds: Normal breath sounds. No wheezing or rhonchi.  Chest:     Chest wall: No tenderness.  Musculoskeletal:        General: Tenderness present. No signs of injury.     Right lower leg: No edema.     Left lower leg: No edema.     Comments: Right thoracic back is tender  Neurological:     Mental Status: She is alert and oriented to person, place, and time.  UC Treatments / Results  Labs (all labs ordered are listed, but only abnormal results are displayed) Labs Reviewed - No data to display  EKG   Radiology No results found.  Procedures  Procedures (including critical care time)  Medications Ordered in UC Medications - No data to display  Initial Impression / Assessment and Plan / UC Course  I have reviewed the triage vital signs and the nursing notes.  Pertinent labs & imaging results that were available during my care of the patient were reviewed by me and considered in my medical decision making (see chart for details).    Patient stable for discharge.  Benign physical exam.  Patient is afebrile.  Baclofen prednisone and ibuprofen was ordered.  Advised patient to follow-up with primary care or to return to the urgent care if symptoms get worse. Final Clinical Impressions(s) / UC Diagnoses   Final diagnoses:  Acute right-sided thoracic back pain   Discharge Instructions   None    ED Prescriptions    Medication Sig Dispense Auth. Provider   ibuprofen (ADVIL) 800 MG tablet Take 1 tablet (800 mg total) by mouth 3 (three) times daily. 21 tablet Doyl Bitting S, FNP   predniSONE (DELTASONE) 10 MG tablet Take 2 tablets (20 mg total) by mouth daily. 15 tablet Luma Clopper S, FNP   baclofen (LIORESAL) 10 MG tablet Take 1 tablet (10 mg total) by mouth 3 (three) times daily. 30 each Tarquin Welcher, Zachery DakinsKomlanvi S, FNP     I have reviewed the PDMP during this encounter.   Durward Parcelvegno, Orel Cooler S, FNP 08/20/19 1752

## 2019-08-29 DIAGNOSIS — M545 Low back pain: Secondary | ICD-10-CM | POA: Diagnosis not present

## 2019-08-29 DIAGNOSIS — M546 Pain in thoracic spine: Secondary | ICD-10-CM | POA: Diagnosis not present

## 2019-09-02 ENCOUNTER — Encounter: Payer: Self-pay | Admitting: Family Medicine

## 2019-09-02 ENCOUNTER — Other Ambulatory Visit: Payer: Self-pay

## 2019-09-02 ENCOUNTER — Ambulatory Visit (INDEPENDENT_AMBULATORY_CARE_PROVIDER_SITE_OTHER): Payer: Medicaid Other | Admitting: Family Medicine

## 2019-09-02 VITALS — BP 138/74 | HR 85 | Wt 206.4 lb

## 2019-09-02 DIAGNOSIS — I10 Essential (primary) hypertension: Secondary | ICD-10-CM

## 2019-09-02 MED ORDER — BLOOD PRESSURE KIT: PACK | 0 refills | Status: DC

## 2019-09-02 NOTE — Assessment & Plan Note (Signed)
>>  ASSESSMENT AND PLAN FOR HYPERTENSION, BENIGN WRITTEN ON 09/02/2019 11:05 AM BY BEARD, SAMANTHA N, DO  Borderline control today.  Recently transitioned to losartan due to bradycardia (55) and benefit of kidney protection and tapered off of atenolol during last visit on 12/4 with PCP.  However, she appears to be experiencing more frequent PVCs as she had previously prior to starting atenolol, which is likely related to discontinuing atenolol rather than reaction to the losartan.  Reviewed her previous office vitals with patient, appears with her chronic regimen of HCTZ and atenolol for the past several years that her blood pressure has actually been quite controlled with her heart rate usually within the 60s which is reasonable.   -Decided to DC losartan, restart atenolol in stepwise fashion up to her 50 mg -Discussed utmost importance for HTN control given her possible history of amaurosis fugax currently undergoing work-up and diastolic dysfunction -Prescribed BP kit hopefully to be covered by Medicaid, she will be checking her blood pressure every couple of days at the pharmacy and keeping a journal, goal <130/80 -Follow-up with PCP at the beginning of 09/2019 to discuss changes and further hypertension regimen modification if needed, could consider amlodipine

## 2019-09-02 NOTE — Progress Notes (Signed)
   Subjective:    Patient ID: Mariah [name], female    DOB: 13-May-1961, 58 y.o.   MRN: [id number]   CC: Reaction to losartan  HPI: Mariah [name] is a 58 year old female with hypertension, previous TIA, cervical myelopathy, amaurosis fugax, prediabetes, and hyperlipidemia presenting discuss the following:  Concern about losartan: She was recently started on losartan 25 mg on 12/4 and tapered off her atenolol.  She currently is also on HCTZ 25 mg.  She has previously tried lisinopril, however was discontinued due to cough.  Started the losartan slowly on Saturday, 12/12, after tapering off the atenolol.  She noticed when she was taking the losartan only that she would feel her heart skip a beat randomly throughout the day, she previously used to experience this several years ago.  Mainly notices when she is resting, but has occasionally felt it when she is walking around.  Not having it currently.  Denies any associated chest pain/pressure, shortness of breath, pre-/syncopal symptoms, or urinary symptoms.  Never has this concern when she was on the atenolol and wishes to go back.  Tolerated to atenolol well for several years.   Smoking status reviewed  Review of Systems Per HPI    Objective:  BP 138/74   Pulse 85   Wt 206 lb 6.4 oz (93.6 kg)   SpO2 100%   BMI 33.31 kg/m  Vitals and nursing note reviewed  General: NAD, pleasant Cardiac: RRR, normal heart sounds, 2/6 systolic murmur Respiratory: CTAB, normal effort Extremities: no edema or cyanosis. WWP. Skin: warm and dry Neuro: alert and oriented, no focal deficits Psych: normal affect  Assessment & Plan:    HYPERTENSION, BENIGN Borderline control today.  Recently transitioned to losartan due to bradycardia (55) and benefit of kidney protection and tapered off of atenolol during last visit on 12/4 with PCP.  However, she appears to be experiencing more frequent PVCs as she had previously prior to starting atenolol, which is  likely related to discontinuing atenolol rather than reaction to the losartan.  Reviewed her previous office vitals with patient, appears with her chronic regimen of HCTZ and atenolol for the past several years that her blood pressure has actually been quite controlled with her heart rate usually within the 60s which is reasonable.   -Decided to DC losartan, restart atenolol in stepwise fashion up to her 50 mg -Discussed utmost importance for HTN control given her possible history of amaurosis fugax currently undergoing work-up and diastolic dysfunction -Prescribed BP kit hopefully to be covered by Medicaid, she will be checking her blood pressure every couple of days at the pharmacy and keeping a journal, goal <130/80 -Follow-up with PCP at the beginning of 09/2019 to discuss changes and further hypertension regimen modification if needed, could consider amlodipine   Follow-up in approximately 3 weeks or sooner if needed.  [facility] Resident PGY-2

## 2019-09-02 NOTE — Patient Instructions (Addendum)
It was wonderful seeing you today.  Please go ahead and stop losartan, and you can restart your atenolol.  You can start with atenolol 25 mg for 1 week and increase back up to your 50 mg.  If written a prescription for a blood pressure kit, will try to see if this can be covered by Medicaid, please let me know if you have any troubles.  You can also use your local pharmacy to check your blood pressure and start keeping a journal of this.  Our goal is to have your blood pressure under 130/80.  Make sure you are relaxed before you take your blood pressure for at least 5 minutes and have both your feet on the floor.  Would like you to follow-up with Dr. Ouida Sills at the beginning of the new year to discuss this and see how your blood pressure is, bring a journal at that time.

## 2019-09-02 NOTE — Assessment & Plan Note (Addendum)
Borderline control today.  Recently transitioned to losartan due to bradycardia (55) and benefit of kidney protection and tapered off of atenolol during last visit on 12/4 with PCP.  However, she appears to be experiencing more frequent PVCs as she had previously prior to starting atenolol, which is likely related to discontinuing atenolol rather than reaction to the losartan.  Reviewed her previous office vitals with patient, appears with her chronic regimen of HCTZ and atenolol for the past several years that her blood pressure has actually been quite controlled with her heart rate usually within the 60s which is reasonable.   -Decided to DC losartan, restart atenolol in stepwise fashion up to her 50 mg -Discussed utmost importance for HTN control given her possible history of amaurosis fugax currently undergoing work-up and diastolic dysfunction -Prescribed BP kit hopefully to be covered by Medicaid, she will be checking her blood pressure every couple of days at the pharmacy and keeping a journal, goal <130/80 -Follow-up with PCP at the beginning of 09/2019 to discuss changes and further hypertension regimen modification if needed, could consider amlodipine

## 2019-09-19 ENCOUNTER — Ambulatory Visit (HOSPITAL_COMMUNITY)
Admission: RE | Admit: 2019-09-19 | Discharge: 2019-09-19 | Disposition: A | Discharge status: Home / Self Care | Payer: Medicaid Other | Source: Ambulatory Visit | Attending: Family Medicine | Admitting: Family Medicine

## 2019-09-19 ENCOUNTER — Other Ambulatory Visit: Payer: Self-pay

## 2019-09-19 ENCOUNTER — Ambulatory Visit (HOSPITAL_BASED_OUTPATIENT_CLINIC_OR_DEPARTMENT_OTHER)
Admission: RE | Admit: 2019-09-19 | Discharge: 2019-09-19 | Disposition: A | Discharge status: Home / Self Care | Payer: Medicaid Other | Source: Ambulatory Visit | Attending: Family Medicine | Admitting: Family Medicine

## 2019-09-19 DIAGNOSIS — E785 Hyperlipidemia, unspecified: Secondary | ICD-10-CM | POA: Insufficient documentation

## 2019-09-19 DIAGNOSIS — G453 Amaurosis fugax: Secondary | ICD-10-CM | POA: Insufficient documentation

## 2019-09-19 DIAGNOSIS — I340 Nonrheumatic mitral (valve) insufficiency: Secondary | ICD-10-CM | POA: Insufficient documentation

## 2019-09-19 DIAGNOSIS — I119 Hypertensive heart disease without heart failure: Secondary | ICD-10-CM | POA: Diagnosis not present

## 2019-09-19 NOTE — Progress Notes (Signed)
  Echocardiogram 2D Echocardiogram has been performed.  Nesiah Jump A Tahtiana Rozier 09/19/2019, 10:14 AM

## 2019-09-30 ENCOUNTER — Encounter: Payer: Self-pay | Admitting: Family Medicine

## 2019-10-06 ENCOUNTER — Encounter: Payer: Self-pay | Admitting: Gastroenterology

## 2019-10-07 ENCOUNTER — Telehealth: Payer: Self-pay | Admitting: *Deleted

## 2019-10-07 NOTE — Telephone Encounter (Signed)
Yes, per my note in 05/2019 we discussed Plavix with relationship to her EGD/colonoscopy, and decided to continue Plavix given risk/benefit profile.  I am not planning on esophageal dilation.  Per that discussion, patients are counseled that if there is a large polyp in the colon, will typically avoid resecting in favor of Plavix hold later.  Okay to continue.

## 2019-10-07 NOTE — Telephone Encounter (Signed)
Bre,  This pt is on Plavix- she saw Cirigliano 06-15-2019 and he ordered ECL- however, no one got a plavix hold on her. It was not even mentioned in his OV note from September-  She had a CVA 2018- Can you see if we can hold her Plavix for her ECL   Thanks, Hilda Lias

## 2019-10-07 NOTE — Telephone Encounter (Signed)
Per the OV notation on 05/2019, it was documented : Discussed antiplatelet therapy at length today.  Given risk/benefit profile, plan to resume Plavix through perioperative period;  Will you please clarify if the patient can continue Plavix or if she needs to hold Plavix for her procedure? Thanks Bre

## 2019-10-07 NOTE — Telephone Encounter (Signed)
Please see Dr. Renaye Rakers previous note Thanks Bre

## 2019-10-15 DIAGNOSIS — K219 Gastro-esophageal reflux disease without esophagitis: Secondary | ICD-10-CM | POA: Diagnosis not present

## 2019-10-15 DIAGNOSIS — K2900 Acute gastritis without bleeding: Secondary | ICD-10-CM | POA: Diagnosis not present

## 2019-10-18 ENCOUNTER — Telehealth: Payer: Self-pay | Admitting: Family Medicine

## 2019-10-18 NOTE — Telephone Encounter (Signed)
Jasmine December from [facility] is requesting a updated referral to be able to see the patient. Please use H40.013 as the CPT code. Jw

## 2019-10-18 NOTE — Telephone Encounter (Signed)
Will forward to MD. Mariah [name],CMA  

## 2019-10-20 ENCOUNTER — Telehealth: Payer: Self-pay

## 2019-10-20 DIAGNOSIS — H40013 Open angle with borderline findings, low risk, bilateral: Secondary | ICD-10-CM | POA: Diagnosis not present

## 2019-10-20 DIAGNOSIS — E119 Type 2 diabetes mellitus without complications: Secondary | ICD-10-CM | POA: Diagnosis not present

## 2019-10-20 NOTE — Telephone Encounter (Signed)
LVM for pt to call our office. If pt calls, please ask COVID screening questions. Lynnex Fulp T Mervyn Pflaum, CMA  

## 2019-10-21 ENCOUNTER — Other Ambulatory Visit: Payer: Self-pay

## 2019-10-21 ENCOUNTER — Ambulatory Visit (INDEPENDENT_AMBULATORY_CARE_PROVIDER_SITE_OTHER): Payer: Medicaid Other | Admitting: Family Medicine

## 2019-10-21 ENCOUNTER — Encounter: Payer: Self-pay | Admitting: Family Medicine

## 2019-10-21 ENCOUNTER — Other Ambulatory Visit: Payer: Self-pay | Admitting: Family Medicine

## 2019-10-21 VITALS — BP 128/72 | HR 57 | Wt 213.0 lb

## 2019-10-21 DIAGNOSIS — E7800 Pure hypercholesterolemia, unspecified: Secondary | ICD-10-CM | POA: Diagnosis not present

## 2019-10-21 DIAGNOSIS — I10 Essential (primary) hypertension: Secondary | ICD-10-CM | POA: Diagnosis not present

## 2019-10-21 DIAGNOSIS — H40013 Open angle with borderline findings, low risk, bilateral: Secondary | ICD-10-CM

## 2019-10-21 LAB — POCT GLYCOSYLATED HEMOGLOBIN (HGB A1C): HbA1c, POC (controlled diabetic range): 7.2 % — AB (ref 0.0–7.0)

## 2019-10-21 MED ORDER — CLOPIDOGREL BISULFATE 75 MG PO TABS: ORAL_TABLET | ORAL | 3 refills | Status: DC

## 2019-10-21 MED ORDER — BLOOD PRESSURE KIT: PACK | 0 refills | Status: DC

## 2019-10-21 MED ORDER — LOSARTAN POTASSIUM 25 MG PO TABS: 25.0000 mg | ORAL_TABLET | Freq: Every day | ORAL | 3 refills | Status: DC

## 2019-10-21 NOTE — Patient Instructions (Signed)
Thank you for coming in to see [place] today! Please see below to review our plan for today's visit:  1. I will call you with your results! 2. Take Losartan 25mg  w/ HCTZ every day. Keep taking the Atenolol 1/2 tablet every day with the Losartan and HCTZ for 2 weeks.  3. Let's check back in around 2 weeks.  Please call the clinic at [phone] if your symptoms worsen or you have any concerns. It was our pleasure to serve you!    Dr. [phone] [facility]

## 2019-10-21 NOTE — Progress Notes (Signed)
   Subjective:    Patient ID: Mariah [name], female    DOB: 1961/03/27, 59 y.o.   MRN: [id number]   CC: Check up, Procedures on Plavix  HPI: HTN: Patient's BP today is 128/72 mmHg. At her last appt (Dec 2020) she was instructed to wean off of Atenolol and was started on Losartan '25mg'$ , told to stay on HCTZ. The patient reports today that she still taking the Atenolol and HCTZ (of note, patient's pulse 57). She tried weaning from the atenolol but this "made her heart beat funny", which made her uncomfortable. She threw the Losartan away.  The patient is asking for a kit for checking her blood pressure at home.  Patient is overdue for BMP.  ASCVD Risk: Patient's ASCVD risk 7.5%.  Moderate-high intensity statin is recommended.  Patient reports taking her atorvastatin 40 mg.  Patient is overdue for lipid panel and HbA1c.  Smoking status reviewed - former smoker  Review of Systems -see HPI   Objective:  BP 128/72   Pulse (!) 57   Wt 213 lb (96.6 kg)   SpO2 97%   BMI 34.38 kg/m  Vitals and nursing note reviewed  General: well nourished, in no acute distress Cardiac: RRR, clear S1 and S2, no murmurs appreciated, heart rate 57 Respiratory: CTA bilaterally, speaking in complete sentences, normal work of breathing Abdomen: soft, nontender, nondistended, no masses or organomegaly. Bowel sounds present Extremities: No swelling, edema, or deformity Neuro: Alert and cooperative, no focal neurological deficits appreciated  Assessment & Plan:   HYPERTENSION, BENIGN Highly encourage patient to slowly discontinue atenolol 50 mg by taking 1/2 tablet daily for the next 2 weeks.  Patient reports that the last time she stopped the atenolol she started having frequent PVCs without headache, dizziness, syncopal episode. -Patient instructed to wean off atenolol -Prescribed losartan 25 mg for protective qualities -Continue HCTZ 25 mg -Follow-up 2 weeks  Hyperlipidemia Patient has a history of  hyperlipidemia, last lipid panel from 2019 showed elevated total cholesterol 253.  Patient's ASCVD risk 7.5%. -Continue atorvastatin 40 mg -Draw lipid panel -Check HbA1c    Return in about 2 weeks (around 11/04/2019).   Dr. Milus Banister [facility], PGY-2

## 2019-10-22 LAB — BASIC METABOLIC PANEL
BUN/Creatinine Ratio: 16 (ref 9–23)
BUN: 12 mg/dL (ref 6–24)
CO2: 26 mmol/L (ref 20–29)
Calcium: 9.3 mg/dL (ref 8.7–10.2)
Chloride: 100 mmol/L (ref 96–106)
Creatinine, Ser: 0.75 mg/dL (ref 0.57–1.00)
GFR calc Af Amer: 102 mL/min/{1.73_m2} (ref >59)
GFR calc non Af Amer: 88 mL/min/{1.73_m2} (ref >59)
Glucose: 121 mg/dL — ABNORMAL HIGH (ref 65–99)
Potassium: 4.1 mmol/L (ref 3.5–5.2)
Sodium: 141 mmol/L (ref 134–144)

## 2019-10-26 ENCOUNTER — Other Ambulatory Visit: Payer: Self-pay | Admitting: Family Medicine

## 2019-10-26 DIAGNOSIS — E6609 Other obesity due to excess calories: Secondary | ICD-10-CM

## 2019-10-26 DIAGNOSIS — R7309 Other abnormal glucose: Secondary | ICD-10-CM

## 2019-10-26 HISTORY — DX: Other abnormal glucose: R73.09

## 2019-10-27 NOTE — Assessment & Plan Note (Addendum)
Highly encourage patient to slowly discontinue atenolol 50 mg by taking 1/2 tablet daily for the next 2 weeks.  Patient reports that the last time she stopped the atenolol she started having frequent PVCs without headache, dizziness, syncopal episode. -Patient instructed to wean off atenolol -Prescribed losartan 25 mg for protective qualities -Continue HCTZ 25 mg -Follow-up 2 weeks

## 2019-10-27 NOTE — Progress Notes (Signed)
Patient was called 10/26/2019, informed of normal BMP results (kidney function, electrolytes), and the elevated Glucose and HbA1c of 7.2%. I offered to start patient on mediation (such as Metformin 500mg  daily), however patient would like to try lifestyle modifications first. She is highly motivated. An order was placed for Nutrition referral. I will follow up with patient in about 1 week or so for her blood pressure.

## 2019-10-27 NOTE — Assessment & Plan Note (Addendum)
Patient has a history of hyperlipidemia, last lipid panel from 2019 showed elevated total cholesterol 253.  Patient's ASCVD risk 7.5%. -Continue atorvastatin 40 mg -Draw lipid panel -Check HbA1c

## 2019-10-27 NOTE — Assessment & Plan Note (Signed)
>>  ASSESSMENT AND PLAN FOR HYPERTENSION, BENIGN WRITTEN ON 10/27/2019  5:04 PM BY Peggyann Shoals C, DO  Highly encourage patient to slowly discontinue atenolol 50 mg by taking 1/2 tablet daily for the next 2 weeks.  Patient reports that the last time she stopped the atenolol she started having frequent PVCs without headache, dizziness, syncopal episode. -Patient instructed to wean off atenolol -Prescribed losartan 25 mg for protective qualities -Continue HCTZ 25 mg -Follow-up 2 weeks

## 2019-10-28 DIAGNOSIS — M545 Low back pain: Secondary | ICD-10-CM | POA: Diagnosis not present

## 2019-11-03 ENCOUNTER — Encounter: Payer: Medicaid Other | Admitting: Gastroenterology

## 2019-11-04 ENCOUNTER — Other Ambulatory Visit: Payer: Self-pay

## 2019-11-04 ENCOUNTER — Ambulatory Visit (INDEPENDENT_AMBULATORY_CARE_PROVIDER_SITE_OTHER): Payer: Medicaid Other | Admitting: Family Medicine

## 2019-11-04 ENCOUNTER — Encounter: Payer: Self-pay | Admitting: Family Medicine

## 2019-11-04 VITALS — BP 118/60 | HR 65 | Ht 66.0 in | Wt 213.2 lb

## 2019-11-04 DIAGNOSIS — E782 Mixed hyperlipidemia: Secondary | ICD-10-CM

## 2019-11-04 DIAGNOSIS — I739 Peripheral vascular disease, unspecified: Secondary | ICD-10-CM

## 2019-11-04 DIAGNOSIS — I70219 Atherosclerosis of native arteries of extremities with intermittent claudication, unspecified extremity: Secondary | ICD-10-CM | POA: Insufficient documentation

## 2019-11-04 DIAGNOSIS — I10 Essential (primary) hypertension: Secondary | ICD-10-CM

## 2019-11-04 NOTE — Progress Notes (Signed)
    SUBJECTIVE:   CHIEF COMPLAINT / HPI:   HTN: Patient has a history of hypertension, blood pressure today 118/60 mmHg.  She has been decreasing her atenolol, recently dividing her pills in half over the last couple of weeks.  She is nervous to stop taking her atenolol because it causes her to have palpitations and noticed her heartbeat more.  She is concerned that the stronger heartbeat and palpitations are sign that something is going wrong.  The patient has been taking her losartan every day.  She denies any shortness of breath, chest pain, headaches, and vision changes.  Left calf pain: Patient reports that she feels a left calf tightness that happens when she walks long distances and exercises.  The pain is relieved by resting.  The patient does have a history of TIA (takes Plavix daily), hyperlipidemia (takes atorvastatin 40 mg daily), and type 2 diabetes which she is trying to manage with exercise and dietary changes.  He has an upcoming appointment with nutritionist Dr. Wyona Almas to further improve her diet.  According to patient's orthopedist, the pain in her left calf is most likely due to spinal stenosis.  Patient is overdue for a lipid panel, which we will order today.  PERTINENT  PMH / PSH:  TIA Hypertension Hyperlipidemia Type 2 diabetes Spinal stenosis  OBJECTIVE:   BP 118/60   Pulse 65   Ht 5\' 6"  (1.676 m)   Wt 96.7 kg   SpO2 99%   BMI 34.42 kg/m    Physical exam: General: Pleasant patient, no apparent distress Respiratory: CTA bilaterally, normal work of breathing, speaking in complete sentences Cardiac: S1-S2 present, no murmurs or gallops appreciated today  ABI    ASSESSMENT/PLAN:   Claudication of left lower extremity (HCC) ABI performed in office today reveals left lower extremity PAD. -Continue Plavix 75 mg and atorvastatin 40 mg -Repeat lipid panel today -Ambulatory referral to vascular clinic for further information/options for  intervention  Hyperlipidemia Patient is overdue for a lipid panel -Ordering lipid panel today -Continue atorvastatin 40 mg daily  HYPERTENSION, BENIGN Patient with history of hypertension, blood pressure today 118/60 mmHg.  Patient still taking atenolol although at a reduced dose, heart rate today 65 bpm. -Patient will take 1/4 tablet atenolol for the next 1-2 weeks -Continue losartan 25 mg daily -Continue HCTZ 25 mg daily     , DO [facility]

## 2019-11-04 NOTE — Assessment & Plan Note (Signed)
>>  ASSESSMENT AND PLAN FOR HYPERTENSION, BENIGN WRITTEN ON 11/04/2019  6:24 PM BY Peggyann Shoals C, DO  Patient with history of hypertension, blood pressure today 118/60 mmHg.  Patient still taking atenolol although at a reduced dose, heart rate today 65 bpm. -Patient will take 1/4 tablet atenolol for the next 1-2 weeks -Continue losartan 25 mg daily -Continue HCTZ 25 mg daily

## 2019-11-04 NOTE — Patient Instructions (Signed)
Thank you for coming in to see [place] today! Please see below to review our plan for today's visit:  1. We are checking your ABI today and your Cholesterol Panel. I will call you with the results. 2. If you would like to continue to bridge your atenolol you can take 1/4 of a pill daily for the next week or 2.   Please call the clinic at [phone] if your symptoms worsen or you have any concerns. It was our pleasure to serve you!    Dr. Peggyann Shoals [facility]

## 2019-11-04 NOTE — Assessment & Plan Note (Signed)
ABI performed in office today reveals left lower extremity PAD. -Continue Plavix 75 mg and atorvastatin 40 mg -Repeat lipid panel today -Ambulatory referral to vascular clinic for further information/options for intervention

## 2019-11-04 NOTE — Assessment & Plan Note (Signed)
Patient is overdue for a lipid panel -Ordering lipid panel today -Continue atorvastatin 40 mg daily

## 2019-11-04 NOTE — Assessment & Plan Note (Signed)
Patient with history of hypertension, blood pressure today 118/60 mmHg.  Patient still taking atenolol although at a reduced dose, heart rate today 65 bpm. -Patient will take 1/4 tablet atenolol for the next 1-2 weeks -Continue losartan 25 mg daily -Continue HCTZ 25 mg daily

## 2019-11-05 LAB — LIPID PANEL
Chol/HDL Ratio: 3.8 ratio (ref 0.0–4.4)
Cholesterol, Total: 155 mg/dL (ref 100–199)
HDL: 41 mg/dL (ref >39)
LDL Chol Calc (NIH): 67 mg/dL (ref 0–99)
Triglycerides: 294 mg/dL — ABNORMAL HIGH (ref 0–149)
VLDL Cholesterol Cal: 47 mg/dL — ABNORMAL HIGH (ref 5–40)

## 2019-11-08 ENCOUNTER — Other Ambulatory Visit: Payer: Self-pay

## 2019-11-08 ENCOUNTER — Other Ambulatory Visit: Payer: Self-pay | Admitting: Family Medicine

## 2019-11-08 DIAGNOSIS — I10 Essential (primary) hypertension: Secondary | ICD-10-CM

## 2019-11-08 MED ORDER — LOSARTAN POTASSIUM 25 MG PO TABS: 25.0000 mg | ORAL_TABLET | Freq: Every day | ORAL | 3 refills | Status: DC

## 2019-11-08 NOTE — Telephone Encounter (Signed)
Patient needs refill on her Lasartan sent to [facility] on [address]. She only has one pill left as of today.

## 2019-11-10 ENCOUNTER — Encounter (INDEPENDENT_AMBULATORY_CARE_PROVIDER_SITE_OTHER): Payer: Medicaid Other | Admitting: Vascular Surgery

## 2019-11-10 ENCOUNTER — Encounter (INDEPENDENT_AMBULATORY_CARE_PROVIDER_SITE_OTHER): Payer: Medicaid Other

## 2019-11-11 ENCOUNTER — Other Ambulatory Visit: Payer: Self-pay | Admitting: *Deleted

## 2019-11-15 NOTE — Telephone Encounter (Signed)
2nd request from pharmacy. Shari Natt,CMA  

## 2019-11-16 ENCOUNTER — Encounter (HOSPITAL_COMMUNITY): Payer: Self-pay | Admitting: *Deleted

## 2019-11-16 ENCOUNTER — Other Ambulatory Visit (INDEPENDENT_AMBULATORY_CARE_PROVIDER_SITE_OTHER): Payer: Self-pay | Admitting: Vascular Surgery

## 2019-11-16 ENCOUNTER — Ambulatory Visit (HOSPITAL_COMMUNITY)
Admission: EM | Admit: 2019-11-16 | Discharge: 2019-11-16 | Disposition: A | Discharge status: Home / Self Care | Payer: Medicaid Other

## 2019-11-16 ENCOUNTER — Other Ambulatory Visit: Payer: Self-pay

## 2019-11-16 DIAGNOSIS — I739 Peripheral vascular disease, unspecified: Secondary | ICD-10-CM

## 2019-11-16 DIAGNOSIS — M79605 Pain in left leg: Secondary | ICD-10-CM | POA: Diagnosis not present

## 2019-11-16 NOTE — Discharge Instructions (Addendum)
You may take 500mg -650mg  Tylenol every 6 hours for pain and inflammation. Discuss using cilostazol with your vascular doctor to see if this won't help more with your claudication.

## 2019-11-16 NOTE — ED Provider Notes (Signed)
Mariah [name]   MRN: [id number] DOB: [birth year]  Subjective:   Mariah [name] is a 59 y.o. female presenting for 2-week history of intermittent persistent left lower leg pain between her knee and lower part of her calf.  She states that symptoms happen when she is walking or sometimes when she is in bed.  She was diagnosed with peripheral arterial disease about 2 weeks ago and is scheduled to see her vascular provider in 1 week.  Patient also reports intermittent shortness of breath today when she was having her blood pressure checked.  Denies fever, chest pain, calf redness, calf swelling.  Patient has a remote history of smoking, quit in 1997.  She is taking Plavix currently.  No current facility-administered medications for this encounter.  Current Outpatient Medications:  .  atorvastatin (LIPITOR) 40 MG tablet, Take 1 tablet (40 mg total) by mouth daily., Disp: 90 tablet, Rfl: 3 .  Blood Pressure KIT, Check BP every other day, keep journal., Disp: 1 kit, Rfl: 0 .  clopidogrel (PLAVIX) 75 MG tablet, TAKE 1 TABLET(75 MG) BY MOUTH DAILY, Disp: 90 tablet, Rfl: 3 .  fluticasone (FLONASE) 50 MCG/ACT nasal spray, INSTILL 2 SPRAY IN EACH NOSTRIL EVERY DAY, Disp: 16 g, Rfl: 11 .  hydrochlorothiazide (HYDRODIURIL) 25 MG tablet, TAKE 1 TABLET(25 MG) BY MOUTH DAILY, Disp: 90 tablet, Rfl: 3 .  losartan (COZAAR) 25 MG tablet, Take 1 tablet (25 mg total) by mouth at bedtime., Disp: 90 tablet, Rfl: 3 .  oxyCODONE-acetaminophen (PERCOCET) 10-325 MG tablet, Take 1 tablet by mouth every 6 (six) hours as needed., Disp: , Rfl: 0   Allergies  Allergen Reactions  . Lisinopril     REACTION: COUGH  . Moxifloxacin     REACTION: N \T\ V  . Sulfonamide Derivatives Other (See Comments)    Pt cannot remember rxn    Past Medical History:  Diagnosis Date  . Degenerative disc disease, lumbar   . GERD (gastroesophageal reflux disease)   . Hypertension   . Mitral valve prolapse   . Seizures ([place])  06/23/2017     Past Surgical History:  Procedure Laterality Date  . ABDOMINAL HYSTERECTOMY    . ABDOMINAL HYSTERECTOMY    . BACK SURGERY    . CHOLECYSTECTOMY    . KNEE SURGERY      Family History  Problem Relation Age of Onset  . Lymphoma Mother   . Lung cancer Father   . Pulmonary embolism Brother   . Colon cancer Neg Hx   . Esophageal cancer Neg Hx   . Liver cancer Neg Hx   . Pancreatic cancer Neg Hx   . Rectal cancer Neg Hx   . Stomach cancer Neg Hx     Social History   Tobacco Use  . Smoking status: Former Smoker    Quit date: 09/16/1995    Years since quitting: 24.1  . Smokeless tobacco: Never Used  Substance Use Topics  . Alcohol use: Yes  . Drug use: Yes    Types: Marijuana    ROS   Objective:   Vitals: BP (!) 159/83 (BP Location: Left Arm) Comment: patient takes BP meds at 10a  Pulse 68   Temp 98.5 F (36.9 C) (Oral)   Resp 17   SpO2 100%   Physical Exam Constitutional:      General: She is not in acute distress.    Appearance: Normal appearance. She is well-developed. She is not ill-appearing, toxic-appearing or diaphoretic.  HENT:  Head: Normocephalic and atraumatic.     Nose: Nose normal.     Mouth/Throat:     Mouth: Mucous membranes are moist.  Eyes:     Extraocular Movements: Extraocular movements intact.     Pupils: Pupils are equal, round, and reactive to light.  Cardiovascular:     Rate and Rhythm: Normal rate and regular rhythm.     Pulses: Normal pulses.     Heart sounds: Normal heart sounds. No murmur. No friction rub. No gallop.   Pulmonary:     Effort: Pulmonary effort is normal. No respiratory distress.     Breath sounds: Normal breath sounds. No stridor. No wheezing, rhonchi or rales.  Musculoskeletal:     Left lower leg: No swelling, deformity, lacerations, tenderness or bony tenderness. No edema.  Skin:    General: Skin is warm and dry.     Findings: No rash.  Neurological:     Mental Status: She is alert and  oriented to person, place, and time.  Psychiatric:        Mood and Affect: Mood normal.        Behavior: Behavior normal.        Thought Content: Thought content normal.      Assessment and Plan :   1. Left leg pain   2. Claudication of calf muscles (HCC)   3. Peripheral artery disease ([place])     Counseled patient on intermittent claudication.  Recommended general conservative management.  Suspect that she had a vasovagal response that elicited shortness of breath while she was having her blood pressure check.  Patient feels fine otherwise and lung sounds were clear, pulse oximetry is at 100%.  Low suspicion for chest PE or DVT given physical exam findings. Counseled patient on potential for adverse effects with medications prescribed/recommended today, ER and return-to-clinic precautions discussed, patient verbalized understanding.    Jaynee Eagles, [place] 11/16/19 [phone]

## 2019-11-16 NOTE — ED Triage Notes (Signed)
Patient reports left leg pain, pt dx with PAD 2 weeks ago to left leg. Patient points to left lateral aspect of knee and lower calf. States leg felt like it was going to give out on her. Patient reports that she has had intermittent pain to left leg for several weeks. States yesterday and last night was worse than in previous times. Denies tingling distally, states that she does have intermittent numbness to leg, states feeling comes right back-says this has been intermittent with her leg pain.

## 2019-11-17 ENCOUNTER — Telehealth: Payer: Medicaid Other | Admitting: Family Medicine

## 2019-11-17 ENCOUNTER — Other Ambulatory Visit: Payer: Self-pay | Admitting: *Deleted

## 2019-11-17 MED ORDER — OMEPRAZOLE 40 MG PO CPDR: 40.0000 mg | DELAYED_RELEASE_CAPSULE | Freq: Every day | ORAL | 3 refills | Status: DC

## 2019-11-21 ENCOUNTER — Other Ambulatory Visit: Payer: Self-pay

## 2019-11-21 ENCOUNTER — Ambulatory Visit (INDEPENDENT_AMBULATORY_CARE_PROVIDER_SITE_OTHER): Payer: Medicaid Other

## 2019-11-21 ENCOUNTER — Encounter (INDEPENDENT_AMBULATORY_CARE_PROVIDER_SITE_OTHER): Payer: Self-pay | Admitting: Vascular Surgery

## 2019-11-21 ENCOUNTER — Encounter (INDEPENDENT_AMBULATORY_CARE_PROVIDER_SITE_OTHER): Payer: Self-pay

## 2019-11-21 ENCOUNTER — Ambulatory Visit (INDEPENDENT_AMBULATORY_CARE_PROVIDER_SITE_OTHER): Payer: Medicaid Other | Admitting: Vascular Surgery

## 2019-11-21 VITALS — BP 148/83 | HR 79 | Resp 16 | Wt 213.0 lb

## 2019-11-21 DIAGNOSIS — I70212 Atherosclerosis of native arteries of extremities with intermittent claudication, left leg: Secondary | ICD-10-CM | POA: Diagnosis not present

## 2019-11-21 DIAGNOSIS — I739 Peripheral vascular disease, unspecified: Secondary | ICD-10-CM

## 2019-11-21 DIAGNOSIS — K219 Gastro-esophageal reflux disease without esophagitis: Secondary | ICD-10-CM | POA: Diagnosis not present

## 2019-11-21 DIAGNOSIS — M5136 Other intervertebral disc degeneration, lumbar region: Secondary | ICD-10-CM | POA: Diagnosis not present

## 2019-11-21 DIAGNOSIS — E782 Mixed hyperlipidemia: Secondary | ICD-10-CM

## 2019-11-21 DIAGNOSIS — H9209 Otalgia, unspecified ear: Secondary | ICD-10-CM | POA: Insufficient documentation

## 2019-11-21 DIAGNOSIS — R0602 Shortness of breath: Secondary | ICD-10-CM | POA: Insufficient documentation

## 2019-11-21 HISTORY — DX: Otalgia, unspecified ear: H92.09

## 2019-11-21 HISTORY — DX: Shortness of breath: R06.02

## 2019-11-23 ENCOUNTER — Other Ambulatory Visit: Payer: Self-pay

## 2019-11-23 ENCOUNTER — Encounter (INDEPENDENT_AMBULATORY_CARE_PROVIDER_SITE_OTHER): Payer: Self-pay | Admitting: Vascular Surgery

## 2019-11-23 ENCOUNTER — Encounter: Payer: Medicaid Other | Attending: Family Medicine | Admitting: Dietician

## 2019-11-23 ENCOUNTER — Encounter: Payer: Self-pay | Admitting: Dietician

## 2019-11-23 DIAGNOSIS — R7309 Other abnormal glucose: Secondary | ICD-10-CM | POA: Diagnosis not present

## 2019-11-23 NOTE — Progress Notes (Signed)
Medical Nutrition Therapy  Appt Start Time: 7:30am    End Time: 8:35am  Primary concerns today: elevated A1c  Referral diagnosis: E66.09- obesity d/t excess calories  Preferred learning style: no preference indicated Learning readiness: ready/change in progress   NUTRITION ASSESSMENT   Anthropometrics  Weight: 209.1 lbs   Clinical Medical Hx: hypercholesterolemia, HTN, stroke, GERD, obesity, prediabetes, arthritis Labs: A1c 7.2% (10/26/2019)  Lifestyle & Dietary Hx Patient states she cannot do rigorous walking or walk for extended periods of time due to an arterial condition in her lower legs. Interested in learning about more types of exercise that she can incorporate more often.  Likes bread and pasta but has avoided these for the past month. Typical meal pattern is 2 meals (skips lunch) plus 1-2 snacks per day. May have baked chicken, fish, or pork with vegetables for supper. May have Cheerios with almond milk and fruit for breakfast if she does not have egg whites, [place] sausage, and avocado toast. When cooks eggs/ meats, will season and cook in olive oil or "I Can't Believe It's Not Butter " spread. Avoids dairy except for cheese. Drinks lots of water, states that 80% of what she drinks is water/ water with lemon.   Estimated daily fluid intake: 5-6 bottles (16 oz each) + soda Current average weekly physical activity: walking 3 days/week   24-Hr Dietary Recall First Meal: 1-2 egg whites + [place] bacon + avocado Snack: -  Second Meal: - Snack: almonds + pecans Third Meal: broccoli + squash + zucchini + chicken  Snack: popcorn  Beverages: water, soda  Estimated Energy Needs Calories: 1500-1600 Carbohydrate: 170-180g Protein: 94-100g Fat: 50-53g   NUTRITION DIAGNOSIS  Food and nutrition related knowledge deficit (NB-1.1) related to no previous nutrition education as evidenced by request for information regarding nutritional management for elevated A1c (7.2%) and general  healthy eating.    NUTRITION INTERVENTION  Nutrition education (E-1) on the following topics:  . Diabetes  . Consistent Carbohydrate Nutrition Therapy   Handouts Provided Include   Types of Diabetes and BG Control   MyPlate Portions   Carbohydrate Counting   ADA 3 Types of Exercise  Learning Style & Readiness for Change Teaching method utilized: Visual & Auditory  Demonstrated degree of understanding via: Teach Back  Barriers to learning/adherence to lifestyle change: None Identified   Goals Established by Pt . Aim for ~3 servings of carbohydrates per meal  . Aim for 1-2 servings of carbohydrates per snack  . Aim for 150 minutes of physical activity each week    MONITORING & EVALUATION Dietary intake, weekly physical activity, and goals in 2 months.  Next Steps  Patient is to return to [facility] for follow up visit around mid-May after she has her A1c rechecked.

## 2019-11-23 NOTE — Progress Notes (Signed)
MRN : [id number]  Mariah [name] is a 59 y.o. ([birth year]) female who presents with chief complaint of  Chief Complaint  Patient presents with  . New Patient (Initial Visit)    ref [place] claudication  .  History of Present Illness:  The patient is seen for evaluation of painful lower extremities and diminished pulses left is much worse than the right. Patient notes the pain is always associated with activity and is very consistent day today. Typically, the pain occurs at less than one block, progress is as activity continues to the point that the patient must stop walking. Resting including standing still for several minutes allowed resumption of the activity and the ability to walk a similar distance before stopping again. Uneven terrain and inclined shorten the distance. The pain has been progressive over the past several years. The patient states the difficulty she is having is not a negative impact on quality of life and daily activities.  The patient denies rest pain or dangling of an extremity off the side of the bed during the night for relief. No open wounds or sores at this time. No prior interventions or surgeries.  There is a history of back problems and DJD of the lumbar sacral spine.   The patient denies changes in claudication symptoms or new rest pain symptoms.  No new ulcers or wounds of the foot.  The patient's blood pressure has been stable and relatively well controlled. The patient denies amaurosis fugax or recent TIA symptoms. There are no recent neurological changes noted. The patient denies history of DVT, PE or superficial thrombophlebitis. The patient denies recent episodes of angina or shortness of breath.    Current Meds  Medication Sig  . atorvastatin (LIPITOR) 40 MG tablet Take 1 tablet (40 mg total) by mouth daily.  . Blood Pressure KIT Check BP every other day, keep journal.  . clopidogrel (PLAVIX) 75 MG tablet TAKE 1 TABLET(75 MG) BY MOUTH  DAILY  . fluticasone (FLONASE) 50 MCG/ACT nasal spray INSTILL 2 SPRAY IN EACH NOSTRIL EVERY DAY  . hydrochlorothiazide (HYDRODIURIL) 25 MG tablet TAKE 1 TABLET(25 MG) BY MOUTH DAILY  . losartan (COZAAR) 25 MG tablet Take 1 tablet (25 mg total) by mouth at bedtime.  Marland Kitchen omeprazole (PRILOSEC) 40 MG capsule Take 1 capsule (40 mg total) by mouth daily.  Marland Kitchen oxyCODONE-acetaminophen (PERCOCET) 10-325 MG tablet Take 1 tablet by mouth every 6 (six) hours as needed.  . sodium chloride (OCEAN) 0.65 % nasal spray Place into the nose.    Past Medical History:  Diagnosis Date  . Degenerative disc disease, lumbar   . Elevated hemoglobin A1c 10/26/2019  . GERD (gastroesophageal reflux disease)   . Hypertension   . Mitral valve prolapse   . Seizures ([place]) 06/23/2017    Past Surgical History:  Procedure Laterality Date  . ABDOMINAL HYSTERECTOMY    . ABDOMINAL HYSTERECTOMY    . BACK SURGERY    . CHOLECYSTECTOMY    . KNEE SURGERY      Social History Social History   Tobacco Use  . Smoking status: Former Smoker    Quit date: 09/16/1995    Years since quitting: 24.2  . Smokeless tobacco: Never Used  Substance Use Topics  . Alcohol use: Yes  . Drug use: Yes    Types: Marijuana    Family History Family History  Problem Relation Age of Onset  . Lymphoma Mother   . Lung cancer Father   . Pulmonary embolism Brother   .  Colon cancer Neg Hx   . Esophageal cancer Neg Hx   . Liver cancer Neg Hx   . Pancreatic cancer Neg Hx   . Rectal cancer Neg Hx   . Stomach cancer Neg Hx     Allergies  Allergen Reactions  . Lisinopril     REACTION: COUGH  . Moxifloxacin     REACTION: N' \\T'$ \ V  . Sulfa Antibiotics     Other reaction(s): Other (See Comments), Other (See Comments) Pt cannot remember rxn Pt cannot remember rxn   . Sulfonamide Derivatives Other (See Comments)    Pt cannot remember rxn     REVIEW OF SYSTEMS (Negative unless checked)  Constitutional: '[]'$ Weight loss  '[]'$ Fever   '[]'$ Chills Cardiac: '[]'$ Chest pain   '[]'$ Chest pressure   '[]'$ Palpitations   '[]'$ Shortness of breath when laying flat   '[]'$ Shortness of breath with exertion. Vascular:  '[x]'$ Pain in legs with walking   '[]'$ Pain in legs at rest  '[]'$ History of DVT   '[]'$ Phlebitis   '[]'$ Swelling in legs   '[]'$ Varicose veins   '[]'$ Non-healing ulcers Pulmonary:   '[]'$ Uses home oxygen   '[]'$ Productive cough   '[]'$ Hemoptysis   '[]'$ Wheeze  '[]'$ COPD   '[]'$ Asthma Neurologic:  '[]'$ Dizziness   '[]'$ Seizures   '[]'$ History of stroke   '[]'$ History of TIA  '[]'$ Aphasia   '[]'$ Vissual changes   '[]'$ Weakness or numbness in arm   '[]'$ Weakness or numbness in leg Musculoskeletal:   '[]'$ Joint swelling   '[x]'$ Joint pain   '[x]'$ Low back pain Hematologic:  '[]'$ Easy bruising  '[]'$ Easy bleeding   '[]'$ Hypercoagulable state   '[]'$ Anemic Gastrointestinal:  '[]'$ Diarrhea   '[]'$ Vomiting  '[x]'$ Gastroesophageal reflux/heartburn   '[]'$ Difficulty swallowing. Genitourinary:  '[]'$ Chronic kidney disease   '[]'$ Difficult urination  '[]'$ Frequent urination   '[]'$ Blood in urine Skin:  '[]'$ Rashes   '[]'$ Ulcers  Psychological:  '[]'$ History of anxiety   '[]'$  History of major depression.  Physical Examination  Vitals:   11/21/19 1400  BP: (!) 148/83  Pulse: 79  Resp: 16  Weight: 213 lb (96.6 kg)   Body mass index is 34.38 kg/m. Gen: WD/WN, NAD Head: [place]/AT, No temporalis wasting.  Ear/Nose/Throat: Hearing grossly intact, nares w/o erythema or drainage Eyes: PER, EOMI, sclera nonicteric.  Neck: Supple, no large masses.   Pulmonary:  Good air movement, no audible wheezing bilaterally, no use of accessory muscles.  Cardiac: RRR, no JVD Vascular:  Vessel Right Left  Radial Palpable Palpable  PT Not Palpable Not Palpable  DP Trace Palpable Not Palpable  Gastrointestinal: Non-distended. No guarding/no peritoneal signs.  Musculoskeletal: M/S 5/5 throughout.  No deformity or atrophy.  Neurologic: CN 2-12 intact. Symmetrical.  Speech is fluent. Motor exam as listed above. Psychiatric: Judgment intact, Mood & affect appropriate for pt's clinical  situation. Dermatologic: No rashes or ulcers noted.  No changes consistent with cellulitis.  CBC Lab Results  Component Value Date   WBC 7.8 10/11/2018   HGB 13.0 10/11/2018   HCT 40.1 10/11/2018   MCV 83 10/11/2018   PLT 238 10/11/2018    BMET    Component Value Date/Time   NA 141 10/21/2019 1625   K 4.1 10/21/2019 1625   CL 100 10/21/2019 1625   CO2 26 10/21/2019 1625   GLUCOSE 121 (H) 10/21/2019 1625   GLUCOSE 100 (H) 06/23/2017 1005   BUN 12 10/21/2019 1625   CREATININE 0.75 10/21/2019 1625   CREATININE 0.80 11/02/2015 1433   CALCIUM 9.3 10/21/2019 1625   GFRNONAA 88 10/21/2019 1625   GFRNONAA 84 11/02/2015 1433   GFRAA 102 10/21/2019  [place] 11/02/2015 1433   CrCl cannot be calculated (Patient's most recent lab result is older than the maximum 21 days allowed.).  COAG Lab Results  Component Value Date   INR 1.0 06/23/2008    Radiology VAS [place] ABI WITH/WO TBI  Result Date: 11/21/2019 LOWER EXTREMITY DOPPLER STUDY Indications: Claudication.  Performing Technologist: Almira Coaster RVS  Examination Guidelines: A complete evaluation includes at minimum, Doppler waveform signals and systolic blood pressure reading at the level of bilateral brachial, anterior tibial, and posterior tibial arteries, when vessel segments are accessible. Bilateral testing is considered an integral part of a complete examination. Photoelectric Plethysmograph (PPG) waveforms and toe systolic pressure readings are included as required and additional duplex testing as needed. Limited examinations for reoccurring indications may be performed as noted.  ABI Findings: +---------+------------------+-----+--------+--------+ Right    Rt Pressure (mmHg)IndexWaveformComment  +---------+------------------+-----+--------+--------+ Brachial 147                                     +---------+------------------+-----+--------+--------+ ATA      144               0.98                   +---------+------------------+-----+--------+--------+ PTA      145               0.99                  +---------+------------------+-----+--------+--------+ Great Toe131               0.89                  +---------+------------------+-----+--------+--------+ +---------+------------------+-----+--------+-------+ Left     Lt Pressure (mmHg)IndexWaveformComment +---------+------------------+-----+--------+-------+ Brachial 140                                    +---------+------------------+-----+--------+-------+ ATA      92                0.63                 +---------+------------------+-----+--------+-------+ PERO     83                0.56                 +---------+------------------+-----+--------+-------+ Great Toe0                 0.00 Absent          +---------+------------------+-----+--------+-------+ +-------+-----------+-----------+------------+------------+ ABI/TBIToday's ABIToday's TBIPrevious ABIPrevious TBI +-------+-----------+-----------+------------+------------+ Right  .99        .89                                 +-------+-----------+-----------+------------+------------+ Left   .63        0                                   +-------+-----------+-----------+------------+------------+  Summary: Right: Resting right ankle-brachial index is within normal range. No evidence of significant right lower extremity arterial disease. The right toe-brachial index is normal. Left: Resting left ankle-brachial index indicates moderate left lower extremity arterial disease. The  left toe-brachial index is abnormal. Imaged the Left PTA; flow seen at the level of the Ankle; monophasic low velocity waveform.  *See table(s) above for measurements and observations.  Electronically signed by Hortencia Pilar MD on 11/21/2019 at 5:15:33 PM.   Final      Assessment/Plan 1. Atherosclerosis of native artery of left lower extremity with intermittent  claudication (HCC)  Recommend:  The patient has evidence of atherosclerosis of the lower extremities with claudication.  The patient does not voice lifestyle limiting changes at this point in time.  Noninvasive studies do not suggest clinically significant change.  No invasive studies, angiography or surgery at this time The patient should continue walking and begin a more formal exercise program.  The patient should continue antiplatelet therapy and aggressive treatment of the lipid abnormalities  No changes in the patient's medications at this time  - VAS [place] ABI WITH/WO TBI; Future  2. Mixed hyperlipidemia Continue antihypertensive medications as already ordered, these medications have been reviewed and there are no changes at this time.   3. Gastroesophageal reflux disease, unspecified whether esophagitis present Continue PPI as already ordered, this medication has been reviewed and there are no changes at this time.  Avoidence of caffeine and alcohol  Moderate elevation of the head of the bed   4. Degenerative disc disease, lumbar Continue NSAID medications as already ordered, these medications have been reviewed and there are no changes at this time.  Continued activity and therapy was stressed.     Hortencia Pilar, MD  11/23/2019 12:00 PM

## 2019-11-23 NOTE — Patient Instructions (Signed)
Practice carbohydrate counting-   Pay attention to the foods that you eat and see where the carbohydrates are  Aim for about 3 servings (or 45 grams) of carbohydrates per meal  Aim for about 1-2 servings (or 15-30 grams) of carbohydrates per snack   Great job of being active!   Aim for 150 minutes of physical activity per week.   Use the exercise packet for ideas!

## 2019-11-24 ENCOUNTER — Other Ambulatory Visit: Payer: Self-pay

## 2019-11-24 ENCOUNTER — Telehealth (INDEPENDENT_AMBULATORY_CARE_PROVIDER_SITE_OTHER): Payer: Medicaid Other | Admitting: Family Medicine

## 2019-11-24 DIAGNOSIS — Z6833 Body mass index (BMI) 33.0-33.9, adult: Secondary | ICD-10-CM | POA: Diagnosis not present

## 2019-11-24 DIAGNOSIS — E6609 Other obesity due to excess calories: Secondary | ICD-10-CM | POA: Diagnosis not present

## 2019-11-24 DIAGNOSIS — R7309 Other abnormal glucose: Secondary | ICD-10-CM | POA: Diagnosis not present

## 2019-11-24 NOTE — Progress Notes (Signed)
Telehealth Encounter I connected with LELAINA OATIS (MRN [id number]) on 3/11/2021by MyChart, along with Iver Nestle, PhD, RD, LDN. I verified that I am speaking with the correct person using two identifiers, and that the patient was in a private environment conducive to confidentiality. The patient agreed to proceed.   Provider was [name] Provider was located at [facility] during this telehealth encounter; Dr. Jenne Campus was at home; patient was at home  Appt start time: 0950 end time: 1025 (35 min)  Reason for telehealth visit: Diabetes/HTN/DM  Relevant history/background: -Patient with history of diabetes (HbA1c 7.2%) -HTN -Newly diagnosed with PAD -TIA x2 -Amaurosis fougax  Assessment:  Usual eating pattern: 2 meals (skips lunch d/t being busy) and couple (popcorn, almonds, pecans, carrot sticks) snacks per day. Frequent foods and beverages: couple sips of soda, coconut milk (patient is lactose intolerant, GERD), water, evening wine.   Usual physical activity: 30 minutes 3 times weekly, floor exercises 2 times weekly, would like add a 3rd day. Sleep: Patient estimates average of 6 hours of sleep/night.  24-hr recall:  (Up at AM): 6:30-7:30 B (7:30-9 AM): 2 egg whites, 2 [place] bacon, 2 wheat bread, less than 1/4 spoon of grits  Snk: none L (1315 PM): 6" sub from [place] mikes with [place], lettuce, banana peppers, oil vinegar salt pepper, drank water Snk (PM): none D (1900 PM): spinach, onions, 1/2 shrimp, regular Pepsi Snk (PM): None Typical day?: Yes, sometimes she has a couple of snacks, sometimes she misses lunch  Intervention: Completed diet and exercise history, and established: - SMART behavioral goals (Smart Goals: Specific, Measurable, Action-oriented, Realistic, Time-based.):  1. Try Josiah Lobo water 2. Soy milk (good protein) instead of coconut water (low cal, low fat, no protein, no carbs, mainly fat) - does not have to 3. Add floor exercises  - Method of  documenting progress:  Not discussed   Diet Recommendations for Diabetes  Carbohydrate includes starch, sugar, and fiber.  Of these, only sugar and starch raise blood glucose.  (Fiber is found in fruits, vegetables [especially skin, seeds, and stalks], whole grains, and beans.)   Starchy (carb) foods: Bread, rice, pasta, potatoes, corn, cereal, grits, crackers, bagels, muffins, all baked goods.  (Fruit, milk, and yogurt also have carbohydrate, but most of these foods will not spike your blood sugar as most starchy foods will.)  A few fruits do cause high blood sugars; use small portions of bananas (limit to 1/2 at a time), grapes, watermelon, oranges, and most tropical fruits.   Protein foods: Meat, fish, poultry, eggs, dairy foods, and beans such as pinto and kidney beans (beans also provide carbohydrate).   Diabetes Diet:  1. Eat at least 3 REAL meals and 1-2 snacks per day. Eat breakfast within the first hour of getting up.  Have something to eat at least every 5 hours while awake.   2. Limit starchy foods to TWO per meal and ONE per snack. ONE portion of a starchy food is equal to the following:   - ONE slice of bread (or its equivalent, such as half of a hamburger bun).   - 1/2 cup of a "scoopable" starchy food such as potatoes or rice.   - 15 grams of Total Carbohydrate as shown on food label.   - Every 4 ounces of a sweet drink (including fruit juice). 3. Include at every meal: a protein food, a carb food, and vegetables and/or fruit.   - Obtain twice the volume of veg's as protein or carbohydrate  foods for both lunch and dinner.   - Fresh or frozen vegetables are best.   - Keep frozen vegetables on hand for a quick option.      For recommendations and goals, see Patient Instructions.    Follow-up: April 16th 08:30am   Dollene Cleveland

## 2019-12-09 ENCOUNTER — Encounter: Payer: Medicaid Other | Admitting: Vascular Surgery

## 2019-12-09 ENCOUNTER — Encounter (HOSPITAL_COMMUNITY): Payer: Medicaid Other

## 2019-12-12 ENCOUNTER — Telehealth: Payer: Self-pay | Admitting: *Deleted

## 2019-12-12 ENCOUNTER — Other Ambulatory Visit: Payer: Self-pay | Admitting: Family Medicine

## 2019-12-12 NOTE — Telephone Encounter (Signed)
Pt was told to increase her atorvastatin to 80mg .  Will need new med sent to pharmacy as her 40mg s have ran out.  , CMA

## 2019-12-13 NOTE — Telephone Encounter (Signed)
2nd request. Rafaella Kole,CMA  

## 2019-12-15 MED ORDER — ATORVASTATIN CALCIUM 40 MG PO TABS: 40.0000 mg | ORAL_TABLET | Freq: Every day | ORAL | 3 refills | Status: DC

## 2019-12-22 ENCOUNTER — Other Ambulatory Visit: Payer: Self-pay | Admitting: Family Medicine

## 2019-12-22 MED ORDER — ATORVASTATIN CALCIUM 80 MG PO TABS: 80.0000 mg | ORAL_TABLET | Freq: Every day | ORAL | 3 refills | Status: DC

## 2019-12-26 NOTE — Telephone Encounter (Signed)
Pt was told to only take 40mg  due to her arm muscle pains ( spoke with her on the phone).  PCP was going to take care of calling into pharmacy. , CMA

## 2019-12-27 ENCOUNTER — Other Ambulatory Visit: Payer: Self-pay

## 2019-12-27 ENCOUNTER — Ambulatory Visit (HOSPITAL_COMMUNITY)
Admission: RE | Admit: 2019-12-27 | Discharge: 2019-12-27 | Disposition: A | Discharge status: Home / Self Care | Payer: Medicaid Other | Source: Ambulatory Visit | Attending: Family Medicine | Admitting: Family Medicine

## 2019-12-27 ENCOUNTER — Ambulatory Visit: Payer: Medicaid Other | Admitting: Family Medicine

## 2019-12-27 ENCOUNTER — Ambulatory Visit (HOSPITAL_COMMUNITY): Payer: Medicaid Other

## 2019-12-27 VITALS — BP 124/78 | HR 63 | Ht 66.0 in | Wt 212.0 lb

## 2019-12-27 DIAGNOSIS — M25461 Effusion, right knee: Secondary | ICD-10-CM | POA: Diagnosis not present

## 2019-12-27 DIAGNOSIS — M1711 Unilateral primary osteoarthritis, right knee: Secondary | ICD-10-CM | POA: Diagnosis not present

## 2019-12-27 DIAGNOSIS — M7989 Other specified soft tissue disorders: Secondary | ICD-10-CM

## 2019-12-27 DIAGNOSIS — M25561 Pain in right knee: Secondary | ICD-10-CM

## 2019-12-27 NOTE — Patient Instructions (Signed)
It was great seeing you today!  I think the possible etiologies of your right knee swelling are a DVT or possible knee arthritis.  I think the knee arthritis is unlikely given her history of a meniscal tear and repair.  We will get an ultrasound of your right leg to say 100% that is not a DVT.  I placed an order for x-rays of your right knee to evaluate if it is arthritis.  Once I have an idea of what is causing your problems we can discuss treatment.  I will call you when these results come back.

## 2019-12-28 ENCOUNTER — Ambulatory Visit (HOSPITAL_COMMUNITY)
Admission: RE | Admit: 2019-12-28 | Discharge: 2019-12-28 | Disposition: A | Discharge status: Home / Self Care | Payer: Medicaid Other | Source: Ambulatory Visit | Attending: Family Medicine | Admitting: Family Medicine

## 2019-12-28 ENCOUNTER — Telehealth: Payer: Self-pay | Admitting: Family Medicine

## 2019-12-28 DIAGNOSIS — M7989 Other specified soft tissue disorders: Secondary | ICD-10-CM

## 2019-12-28 NOTE — Telephone Encounter (Signed)
Family medicine telephone note  Called patient and informed her of negative DVT ultrasound.  Right knee x-ray does show mild to moderate arthritis, medial greater than lateral.  Explained that this was likely the cause for her pain and very minor swelling around her knee.  Explained that weight loss is the best way to slow down the progression of her arthritis.  I also shared that open chain kinetic exercises will be very beneficial to her and help decrease some of the pressure on her knee.  Patient is making good dietary changes and trying to be active although her left leg claudication is limiting the somewhat.  Advised her to follow-up with Dr. Dareen Piano or her vascular surgeon at regularly schedule appointments to further discuss options for that issue.  Myrene Buddy MD PGY-3 Family Medicine Resident

## 2019-12-28 NOTE — Progress Notes (Signed)
Right lower extremity venous duplex completed. Refer to "CV Proc" under chart review to view preliminary results.  12/28/2019 8:57 AM Eula Fried., MHA, RVT, RDCS, RDMS

## 2019-12-29 ENCOUNTER — Encounter: Payer: Self-pay | Admitting: Family Medicine

## 2019-12-29 DIAGNOSIS — M25461 Effusion, right knee: Secondary | ICD-10-CM | POA: Insufficient documentation

## 2019-12-29 DIAGNOSIS — G8929 Other chronic pain: Secondary | ICD-10-CM | POA: Insufficient documentation

## 2019-12-29 DIAGNOSIS — M25561 Pain in right knee: Secondary | ICD-10-CM

## 2019-12-29 HISTORY — DX: Pain in right knee: M25.561

## 2019-12-29 HISTORY — DX: Pain in right knee: M25.461

## 2019-12-29 NOTE — Progress Notes (Signed)
   CHIEF COMPLAINT / HPI: 59 year old female who presents with mild medial knee swelling.  Patient states this is been going on for for 5 days.  During the first couple of days she was having pain in the same area within her knee.  The pain has resolved and the swelling has remained, although the swelling has decreased a little bit.  Patient is concerned that she may have a blood clot as her brother passed away from a pulmonary embolus.  Of note the patient had a right medial meniscal tear which was treated with apparent meniscectomy in that area.  Patient has been more active recently.  She has known claudication in her left lower extremity which has been limiting her activity somewhat.  PERTINENT  PMH / PSH:    OBJECTIVE: BP 124/78   Pulse 63   Ht 5\' 6"  (1.676 m)   Wt 212 lb (96.2 kg)   SpO2 99%   BMI 34.22 kg/m   Gen: 59 year old Afro-American female, very pleasant, no acute distress CV: Regular rate rhythm, no/R/G, skin warm and dry Resp: Lungs are auscultation bilaterally, no accessory muscle use Right lower extremity: Very mild amount of swelling noted medial right knee, within the joint space.  No popping or grinding when noted with knee extension or flexion.  No limitation to flexion, extension. Neuro: Alert and oriented, Speech clear, No gross deficits   ASSESSMENT / PLAN:  Pain and swelling of right knee DVT ultrasound negative in right lower and left lower extremities.  Right knee x-rays did show moderate arthritis in her medial portion of her right knee.  Given her increased activity to try and help out with her claudication I believe her pain is likely secondary to arthritis.  Her previous meniscus injury likely predisposed her to this.  I recommended for her to do open chain kinetic exercises to try and strengthen quads.  Things like squats, or jumps likely exacerbate the problem.  Recommended bicycling, swimming, or walking.  Can take Tylenol as needed for any pain the pops  up.  I recommended that weight loss is the best way to slow this problem down.     41 MD PGY-3 Family Medicine Resident [facility]

## 2019-12-29 NOTE — Progress Notes (Signed)
    SUBJECTIVE:   CHIEF COMPLAINT / HPI:  Diet follow-up: Patient last seen 11/24/2019 by dietitian.  Today patient is following up for her dietary habits.  Patient reports she has been meeting all of her goals including incorporating seltzer water into her diet and trying soy milk instead of coconut milk to increase her protein intake.  Additionally, patient has been meeting her goal of doing more floor and stretching exercises.  Today she is asking if she can go back to the gym.  She is also interested in incorporating water aerobics into her regimen.  She also asked about a pill for helping her to lose weight, I mentioned to her the medication liraglutide.  Peripheral artery disease: Patient has history of left peripheral artery disease which limits her walking to 10-15 minutes daily.  Sometimes she is able to walk further distances and other days she has to stop multiple times to rest because of the claudication in her left leg.  Patient states her condition is not yet bad enough to warrant surgical intervention, but was instead instructed by the vascular surgeon to continue to walk as much as she can.   Hyperlipidemia  myalgias: Patient has been taking atorvastatin 40 mg, was recently increased up to atorvastatin 80 mg due to the recent diagnosis of peripheral artery disease.  However, patient states she was having terrible forearm pain when she increase the dose up to 80 mg daily.  She would like to decrease the dosage of atorvastatin down to 40 mg instead of 80 due to the side effects, which she has already done.  She reports that she is no longer having muscle pains.  Health maintenance: Due for colonoscopy, Pneumovax, and foot exam  PERTINENT  PMH / PSH:  Diabetes Hypertension  OBJECTIVE:   BP 125/70   Pulse 70   Wt 210 lb 3.2 oz (95.3 kg)   SpO2 97%   BMI 33.93 kg/m    Physical exam: General: Nontoxic-appearing, pleasant patient Cardiac: RRR, S1-S2 present Respiratory:  Comfortable work of breathing, CTA bilaterally Extremities: No peripheral edema or evidence of venous stasis, skin intact without lesions, no deformity, cyanosis, or or injury  ASSESSMENT/PLAN:   Atherosclerosis of native arteries of extremity with intermittent claudication [facility]) Patient with peripheral artery disease developed this year 2021.  Continues to have claudication with about 10-15 minutes of walking. -Patient instructed to keep a record of  walking and how many minutes she can go before having to stop and rest -Continue atorvastatin 40 mg daily -Patient not to go to the gym, instructed to focus on upper body and core strength  Hyperlipidemia Patient with history of hyperlipidemia, also history of TIA, amaurosis fugax. Patient taking Plavix 75 mg and atorvastatin 80 mg. She was having severe myalgias on atorvastatin 80 mg.   -Stop taking atorvastatin 80 mg, go back to atorvastatin 40 mg due to severe myalgias -Continue Plavix 75 mg daily  OBESITY Patient with history of obesity, today BMI 33.9, weight down 3 pounds today, 210 pounds. -Continue dietary modifications and exercise as possible given limitations of the PAD -Can return to gym, instructed to focus on upper body and core strength -Recommend water aerobics 3 times weekly -Patient will look into liraglutide for weight loss  Health care maintenance Patient due for colonoscopy, Pneumovax, and diabetic foot exam     Dollene Cleveland, DO [facility]

## 2019-12-29 NOTE — Assessment & Plan Note (Addendum)
DVT ultrasound negative in right lower and left lower extremities.  Right knee x-rays did show moderate arthritis in her medial portion of her right knee.  Given her increased activity to try and help out with her claudication I believe her pain is likely secondary to arthritis.  Her previous meniscus injury likely predisposed her to this.  I recommended for her to do open chain kinetic exercises to try and strengthen quads.  Things like squats, or jumps likely exacerbate the problem.  Recommended bicycling, swimming, or walking.  Can take Tylenol as needed for any pain the pops up.  I recommended that weight loss is the best way to slow this problem down.

## 2019-12-30 ENCOUNTER — Encounter: Payer: Self-pay | Admitting: Family Medicine

## 2019-12-30 ENCOUNTER — Other Ambulatory Visit: Payer: Self-pay

## 2019-12-30 ENCOUNTER — Ambulatory Visit (INDEPENDENT_AMBULATORY_CARE_PROVIDER_SITE_OTHER): Payer: Medicaid Other | Admitting: Family Medicine

## 2019-12-30 VITALS — BP 125/70 | HR 70 | Wt 210.2 lb

## 2019-12-30 DIAGNOSIS — Z6833 Body mass index (BMI) 33.0-33.9, adult: Secondary | ICD-10-CM

## 2019-12-30 DIAGNOSIS — E782 Mixed hyperlipidemia: Secondary | ICD-10-CM | POA: Diagnosis not present

## 2019-12-30 DIAGNOSIS — E6609 Other obesity due to excess calories: Secondary | ICD-10-CM

## 2019-12-30 DIAGNOSIS — Z0000 Encounter for general adult medical examination without abnormal findings: Secondary | ICD-10-CM | POA: Diagnosis not present

## 2019-12-30 DIAGNOSIS — I70212 Atherosclerosis of native arteries of extremities with intermittent claudication, left leg: Secondary | ICD-10-CM | POA: Diagnosis not present

## 2019-12-30 MED ORDER — ATORVASTATIN CALCIUM 40 MG PO TABS: 40.0000 mg | ORAL_TABLET | Freq: Every day | ORAL | 3 refills | Status: DC

## 2019-12-30 NOTE — Patient Instructions (Addendum)
Thank you for coming in to see [place] today! Please see below to review our plan for today's visit:  1. Keep a record of your walking and how many minutes you can go each time before you have to start. 2. Keep on the Atorvastatin 40mg  daily. 3. Look up Liraglutide - for weight loss. 4. Focus on your upper body and core strength when you go to the gym! Building muscle will help with fat burn and weight loss.  Please call the clinic at [phone] if your symptoms worsen or you have any concerns. It was our pleasure to serve you!  Dr. [phone]  [facility]

## 2019-12-30 NOTE — Assessment & Plan Note (Addendum)
Patient with history of hyperlipidemia, also history of TIA, amaurosis fugax. Patient taking Plavix 75 mg and atorvastatin 80 mg. She was having severe myalgias on atorvastatin 80 mg.   -Stop taking atorvastatin 80 mg, go back to atorvastatin 40 mg due to severe myalgias -Continue Plavix 75 mg daily

## 2019-12-30 NOTE — Assessment & Plan Note (Addendum)
Patient with history of obesity, today BMI 33.9, weight down 3 pounds today, 210 pounds. -Continue dietary modifications and exercise as possible given limitations of the PAD -Can return to gym, instructed to focus on upper body and core strength -Recommend water aerobics 3 times weekly -Patient will look into liraglutide for weight loss

## 2019-12-30 NOTE — Assessment & Plan Note (Signed)
Patient with peripheral artery disease developed this year 2021.  Continues to have claudication with about 10-15 minutes of walking. -Patient instructed to keep a record of  walking and how many minutes she can go before having to stop and rest -Continue atorvastatin 40 mg daily -Patient not to go to the gym, instructed to focus on upper body and core strength

## 2019-12-30 NOTE — Assessment & Plan Note (Signed)
Patient due for colonoscopy, Pneumovax, and diabetic foot exam

## 2020-01-03 DIAGNOSIS — M545 Low back pain: Secondary | ICD-10-CM | POA: Diagnosis not present

## 2020-01-31 ENCOUNTER — Ambulatory Visit: Payer: Medicaid Other | Admitting: Dietician

## 2020-02-10 ENCOUNTER — Ambulatory Visit: Payer: Medicaid Other | Admitting: Family Medicine

## 2020-03-30 ENCOUNTER — Telehealth: Payer: Self-pay | Admitting: Family Medicine

## 2020-03-30 NOTE — Telephone Encounter (Signed)
Patient is calling stating she is wanting to speak to Doctor Dareen Piano to talk about the Covid shot. Please call the patient [phone]. Thanks

## 2020-03-30 NOTE — Telephone Encounter (Signed)
Spoke with Mariah [name]. She just wants to get Dr. Ewell Poe opinion on getting the COVID vaccine given all her health issues? I told her I would let Dr. Dareen Piano know, and once she gets back with me I will let her know. Aquilla Solian, CMA

## 2020-04-04 NOTE — Telephone Encounter (Signed)
Error. Mariah [name], CMA  

## 2020-04-06 NOTE — Telephone Encounter (Signed)
Patient is calling back again asking for Dr. Dareen Piano to call her to discuss some questions she has about the covid vaccine. She has an appointment but would still like to speak with her before. The best call back numbers are [phone] or [phone].

## 2020-04-11 ENCOUNTER — Other Ambulatory Visit: Payer: Self-pay

## 2020-04-11 ENCOUNTER — Ambulatory Visit: Payer: Medicaid Other | Admitting: Family Medicine

## 2020-04-11 VITALS — BP 110/70 | HR 100 | Wt 203.4 lb

## 2020-04-11 DIAGNOSIS — E119 Type 2 diabetes mellitus without complications: Secondary | ICD-10-CM | POA: Diagnosis not present

## 2020-04-11 DIAGNOSIS — Z23 Encounter for immunization: Secondary | ICD-10-CM | POA: Diagnosis not present

## 2020-04-11 DIAGNOSIS — Z794 Long term (current) use of insulin: Secondary | ICD-10-CM

## 2020-04-11 DIAGNOSIS — Z0000 Encounter for general adult medical examination without abnormal findings: Secondary | ICD-10-CM

## 2020-04-11 NOTE — Assessment & Plan Note (Signed)
Patient received first dose of Pfizer Covid vaccine today 04/11/2020 -Instructed to follow-up 3 weeks for second dose

## 2020-04-11 NOTE — Assessment & Plan Note (Signed)
Patient due for ophthalmology exam, foot exam, Covid vaccine, pneumococcal vaccine, and colonoscopy. -COVID vaccine received today -Foot exam performed at today's visit -Follow-up other issues at future visits

## 2020-04-11 NOTE — Progress Notes (Signed)
    SUBJECTIVE:   CHIEF COMPLAINT / HPI:   Encounter for Covid vaccination: Pleasant 59 year old female with history of type 2 diabetes, TIA, and peripheral artery disease presenting to clinic today to receive her Covid vaccine.  She had many questions about the vaccination, including side effects to expect.  Thorough discussion was had and patient agreed to receive her first Pfizer Covid vaccine.  Health maintenance: Patient overdue for pneumococcal vaccine, colonoscopy, foot exam.  Also in need of ophthalmology exam.  Patient respectfully declined other health maintenance measures other than the foot exam, which was performed at today's visit.  Reports she would like to follow-up for those things in the future  PERTINENT  PMH / PSH:  Patient Active Problem List   Diagnosis Date Noted  . High priority for COVID-19 virus vaccination 04/11/2020  . Atherosclerosis of native arteries of extremity with intermittent claudication (HCC) 11/04/2019  . Hypertrophy of inferior nasal turbinate 07/07/2019  . Eczema 02/11/2018  . 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  . Hyperlipidemia 06/26/2017  . TIA (transient ischemic attack) 06/23/2017  . Health care maintenance 10/24/2016  . Callus of foot 04/29/2016  . Diabetes mellitus without complication (HCC) 11/02/2015  . Status post cervical spinal fusion 04/27/2015  . Cervical myelopathy (HCC) 12/22/2014  . 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 Degenerative disc disease, lumbar   . Mitral valve prolapse   . OBESITY 02/13/2009  . HYPERTENSION, BENIGN 08/07/2008  . Anxiety 02/26/2007  . Allergic rhinitis 02/26/2007  . GERD 02/26/2007     OBJECTIVE:   BP 110/70   Pulse 100   Wt (!) 203 lb 6 oz (92.3 kg)   SpO2 99%   BMI 32.83 kg/m    Physical exam: General: Pleasant patient, well-appearing, no apparent distress Respiratory: CTA bilaterally, comfortable work of breathing Cardio: RRR, S1-S2 present Abdomen: Soft, no masses appreciated,  normal bowel sounds  ASSESSMENT/PLAN:   Diabetes mellitus without complication (HCC) Patient due for updated HbA1c, eye exam, foot exam, pneumococcal vaccine. -Declined eye exam and repeat HbA1c, foot exam performed at today's visit  Health care maintenance Patient due for ophthalmology exam, foot exam, Covid vaccine, pneumococcal vaccine, and colonoscopy. -COVID vaccine received today -Foot exam performed at today's visit -Follow-up other issues at future visits  High priority for COVID-19 virus vaccination Patient received first dose of Pfizer Covid vaccine today 04/11/2020 -Instructed to follow-up 3 weeks for second dose    Dollene Cleveland, DO [facility]

## 2020-04-11 NOTE — Assessment & Plan Note (Addendum)
Patient due for updated HbA1c, eye exam, foot exam, pneumococcal vaccine. -Declined eye exam and repeat HbA1c, foot exam performed at today's visit

## 2020-04-12 DIAGNOSIS — M546 Pain in thoracic spine: Secondary | ICD-10-CM | POA: Diagnosis not present

## 2020-04-12 DIAGNOSIS — M545 Low back pain: Secondary | ICD-10-CM | POA: Diagnosis not present

## 2020-04-16 NOTE — Telephone Encounter (Signed)
Pt came in on 04/11/20 and received 1st dose of COVID vaccine. 2nd vaccine is scheduled for 05/02/20 at 10:30am. Aquilla Solian, CMA

## 2020-05-01 DIAGNOSIS — M545 Low back pain: Secondary | ICD-10-CM | POA: Diagnosis not present

## 2020-05-02 ENCOUNTER — Other Ambulatory Visit: Payer: Self-pay

## 2020-05-02 ENCOUNTER — Ambulatory Visit (INDEPENDENT_AMBULATORY_CARE_PROVIDER_SITE_OTHER): Payer: Medicaid Other

## 2020-05-02 DIAGNOSIS — Z23 Encounter for immunization: Secondary | ICD-10-CM

## 2020-05-02 NOTE — Progress Notes (Signed)
   Covid-19 Vaccination Clinic  Name:  Mariah [name]    MRN: [id number] DOB: 1961/05/25  05/02/2020  [name] was observed post Covid-19 immunization for 15 minutes without incident. She was provided with Vaccine Information Sheet and instruction to access the V-Safe system.   [name] was instructed to call 911 with any severe reactions post vaccine: Marland Kitchen Difficulty breathing  . Swelling of face and throat  . A fast heartbeat  . A bad rash all over body  . Dizziness and weakness   #2 Covid Vaccine administered RD without complication.

## 2020-05-23 ENCOUNTER — Encounter (INDEPENDENT_AMBULATORY_CARE_PROVIDER_SITE_OTHER): Payer: Medicaid Other

## 2020-05-23 ENCOUNTER — Ambulatory Visit (INDEPENDENT_AMBULATORY_CARE_PROVIDER_SITE_OTHER): Payer: Medicaid Other | Admitting: Nurse Practitioner

## 2020-06-13 ENCOUNTER — Ambulatory Visit (INDEPENDENT_AMBULATORY_CARE_PROVIDER_SITE_OTHER): Payer: Medicaid Other

## 2020-06-13 ENCOUNTER — Other Ambulatory Visit: Payer: Self-pay

## 2020-06-13 ENCOUNTER — Encounter (INDEPENDENT_AMBULATORY_CARE_PROVIDER_SITE_OTHER): Payer: Self-pay | Admitting: Nurse Practitioner

## 2020-06-13 ENCOUNTER — Ambulatory Visit (INDEPENDENT_AMBULATORY_CARE_PROVIDER_SITE_OTHER): Payer: Medicaid Other | Admitting: Nurse Practitioner

## 2020-06-13 VITALS — BP 136/81 | HR 74 | Resp 16 | Wt 208.6 lb

## 2020-06-13 DIAGNOSIS — E119 Type 2 diabetes mellitus without complications: Secondary | ICD-10-CM | POA: Diagnosis not present

## 2020-06-13 DIAGNOSIS — I10 Essential (primary) hypertension: Secondary | ICD-10-CM | POA: Diagnosis not present

## 2020-06-13 DIAGNOSIS — E782 Mixed hyperlipidemia: Secondary | ICD-10-CM | POA: Diagnosis not present

## 2020-06-13 DIAGNOSIS — I70212 Atherosclerosis of native arteries of extremities with intermittent claudication, left leg: Secondary | ICD-10-CM

## 2020-06-14 ENCOUNTER — Encounter (INDEPENDENT_AMBULATORY_CARE_PROVIDER_SITE_OTHER): Payer: Self-pay | Admitting: Nurse Practitioner

## 2020-06-14 NOTE — Progress Notes (Signed)
Subjective:    Patient ID: Mariah [name], female    DOB: [birth year], 59 y.o.   MRN: [id number] Chief Complaint  Patient presents with  . Follow-up    ultrasound follow up    The patient returns to the office for followup and review of the noninvasive studies. There have been no interval changes in lower extremity symptoms. No interval shortening of the patient's claudication distance or development of rest pain symptoms. No new ulcers or wounds have occurred since the last visit.  There have been no significant changes to the patient's overall health care.  The patient denies amaurosis fugax or recent TIA symptoms. There are no recent neurological changes noted. The patient denies history of DVT, PE or superficial thrombophlebitis. The patient denies recent episodes of angina or shortness of breath.   ABI Rt=0.92 and Lt=0.61  (previous ABI's Rt=0.99 and Lt=0.63) Duplex ultrasound of the right tibial arteries reveals triphasic waveforms with good toe waveforms.  Left lower extremity has monophasic waveforms with slightly dampened toe waveforms   Review of Systems  All other systems reviewed and are negative.      Objective:   Physical Exam Vitals reviewed.  HENT:     Head: Normocephalic.  Cardiovascular:     Rate and Rhythm: Normal rate and regular rhythm.     Pulses: Normal pulses.  Pulmonary:     Effort: Pulmonary effort is normal.  Skin:    General: Skin is warm and dry.  Neurological:     Mental Status: She is alert and oriented to person, place, and time.  Psychiatric:        Mood and Affect: Mood normal.        Behavior: Behavior normal.        Thought Content: Thought content normal.        Judgment: Judgment normal.     BP 136/81 (BP Location: Right Arm)   Pulse 74   Resp 16   Wt 208 lb 9.6 oz (94.6 kg)   BMI 33.67 kg/m   Past Medical History:  Diagnosis Date  . Cramp of muscle of left upper extremity 06/24/2019  . Decreased peripheral vision of  left eye 08/19/2019  . Degenerative disc disease, lumbar   . Ear pain 11/21/2019  . Elevated hemoglobin A1c 10/26/2019  . Elevated hemoglobin A1c 11/23/2019  . GERD (gastroesophageal reflux disease)   . Hypertension   . Insomnia 07/11/2010   Qualifier: Diagnosis of  By: Tye Savoy MD, Tommi Rumps    . Mitral valve prolapse   . Nasal congestion 04/01/2019  . Numbness and tingling of right hand 01/29/2017  . Pain and swelling of right knee 12/29/2019  . Postprandial bloating 04/01/2019  . Prediabetes 10/24/2016  . Seizures ([place]) 06/23/2017  . Shortness of breath 11/21/2019    Social History   Socioeconomic History  . Marital status: Married    Spouse name: Not on file  . Number of children: Not on file  . Years of education: Not on file  . Highest education level: Not on file  Occupational History  . Not on file  Tobacco Use  . Smoking status: Former Smoker    Quit date: 09/16/1995    Years since quitting: 24.7  . Smokeless tobacco: Never Used  Substance and Sexual Activity  . Alcohol use: Yes  . Drug use: Yes    Types: Marijuana  . Sexual activity: Yes    Birth control/protection: Condom  Other Topics Concern  . Not on file  Social History Narrative  . Not on file   Social Determinants of Health   Financial Resource Strain:   . Difficulty of Paying Living Expenses: Not on file  Food Insecurity:   . Worried About Charity fundraiser in the Last Year: Not on file  . Ran Out of Food in the Last Year: Not on file  Transportation Needs:   . Lack of Transportation (Medical): Not on file  . Lack of Transportation (Non-Medical): Not on file  Physical Activity:   . Days of Exercise per Week: Not on file  . Minutes of Exercise per Session: Not on file  Stress:   . Feeling of Stress : Not on file  Social Connections:   . Frequency of Communication with Friends and Family: Not on file  . Frequency of Social Gatherings with Friends and Family: Not on file  . Attends Religious Services:  Not on file  . Active Member of Clubs or Organizations: Not on file  . Attends Archivist Meetings: Not on file  . Marital Status: Not on file  Intimate Partner Violence:   . Fear of Current or Ex-Partner: Not on file  . Emotionally Abused: Not on file  . Physically Abused: Not on file  . Sexually Abused: Not on file    Past Surgical History:  Procedure Laterality Date  . ABDOMINAL HYSTERECTOMY    . ABDOMINAL HYSTERECTOMY    . BACK SURGERY    . CHOLECYSTECTOMY    . KNEE SURGERY      Family History  Problem Relation Age of Onset  . Lymphoma Mother   . Lung cancer Father   . Pulmonary embolism Brother   . Colon cancer Neg Hx   . Esophageal cancer Neg Hx   . Liver cancer Neg Hx   . Pancreatic cancer Neg Hx   . Rectal cancer Neg Hx   . Stomach cancer Neg Hx     Allergies  Allergen Reactions  . Lisinopril     REACTION: COUGH  . Moxifloxacin     REACTION: N$RemoveBef' \\[id number]$ \ V  . Sulfa Antibiotics     Other reaction(s): Other (See Comments), Other (See Comments) Pt cannot remember rxn Pt cannot remember rxn   . Sulfonamide Derivatives Other (See Comments)    Pt cannot remember rxn       Assessment & Plan:   1. Atherosclerosis of native artery of left lower extremity with intermittent claudication (HCC)  Recommend:  The patient has evidence of atherosclerosis of the lower extremities with claudication.  The patient does not voice lifestyle limiting changes at this point in time.  Noninvasive studies do not suggest clinically significant change.  No invasive studies, angiography or surgery at this time The patient should continue walking and begin a more formal exercise program.  The patient should continue antiplatelet therapy and aggressive treatment of the lipid abnormalities  No changes in the patient's medications at this time  The patient should continue wearing graduated compression socks 10-15 mmHg strength to control the mild edema.    2. Mixed  hyperlipidemia Continue statin as ordered and reviewed, no changes at this time   3. Diabetes mellitus without complication ([place]) Continue hypoglycemic medications as already ordered, these medications have been reviewed and there are no changes at this time.  Hgb A1C to be monitored as already arranged by primary service  4. HYPERTENSION, BENIGN Continue antihypertensive medications as already ordered, these medications have been reviewed and there are no changes at  this time.    Current Outpatient Medications on File Prior to Visit  Medication Sig Dispense Refill  . atorvastatin (LIPITOR) 40 MG tablet Take 1 tablet (40 mg total) by mouth daily. 90 tablet 3  . Blood Pressure KIT Check BP every other day, keep journal. 1 kit 0  . clopidogrel (PLAVIX) 75 MG tablet TAKE 1 TABLET(75 MG) BY MOUTH DAILY 90 tablet 3  . fluticasone (FLONASE) 50 MCG/ACT nasal spray INSTILL 2 SPRAY IN EACH NOSTRIL EVERY DAY 16 g 11  . hydrochlorothiazide (HYDRODIURIL) 25 MG tablet TAKE 1 TABLET(25 MG) BY MOUTH DAILY 90 tablet 3  . losartan (COZAAR) 25 MG tablet Take 1 tablet (25 mg total) by mouth at bedtime. 90 tablet 3  . omeprazole (PRILOSEC) 40 MG capsule Take 1 capsule (40 mg total) by mouth daily. 30 capsule 3  . oxyCODONE-acetaminophen (PERCOCET) 10-325 MG tablet Take 1 tablet by mouth every 6 (six) hours as needed.  0  . sodium chloride (OCEAN) 0.65 % nasal spray Place into the nose.     No current facility-administered medications on file prior to visit.    There are no Patient Instructions on file for this visit. No follow-ups on file.   Kris Hartmann, NP

## 2020-07-09 DIAGNOSIS — S233XXA Sprain of ligaments of thoracic spine, initial encounter: Secondary | ICD-10-CM | POA: Diagnosis not present

## 2020-07-09 DIAGNOSIS — M545 Low back pain, unspecified: Secondary | ICD-10-CM | POA: Diagnosis not present

## 2020-09-09 ENCOUNTER — Other Ambulatory Visit: Payer: Self-pay | Admitting: Family Medicine

## 2020-09-18 DIAGNOSIS — Z20828 Contact with and (suspected) exposure to other viral communicable diseases: Secondary | ICD-10-CM | POA: Diagnosis not present

## 2020-09-27 DIAGNOSIS — M545 Low back pain, unspecified: Secondary | ICD-10-CM | POA: Diagnosis not present

## 2020-10-17 DIAGNOSIS — M545 Low back pain, unspecified: Secondary | ICD-10-CM | POA: Diagnosis not present

## 2020-10-19 DIAGNOSIS — M545 Low back pain, unspecified: Secondary | ICD-10-CM | POA: Diagnosis not present

## 2020-10-26 ENCOUNTER — Other Ambulatory Visit: Payer: Self-pay | Admitting: Family Medicine

## 2020-10-26 ENCOUNTER — Encounter: Payer: Self-pay | Admitting: Family Medicine

## 2020-10-26 ENCOUNTER — Ambulatory Visit (INDEPENDENT_AMBULATORY_CARE_PROVIDER_SITE_OTHER): Payer: Medicaid Other | Admitting: Family Medicine

## 2020-10-26 ENCOUNTER — Other Ambulatory Visit: Payer: Self-pay

## 2020-10-26 VITALS — BP 134/73 | HR 65 | Ht 66.0 in | Wt 209.8 lb

## 2020-10-26 DIAGNOSIS — H6121 Impacted cerumen, right ear: Secondary | ICD-10-CM | POA: Diagnosis not present

## 2020-10-26 DIAGNOSIS — E119 Type 2 diabetes mellitus without complications: Secondary | ICD-10-CM

## 2020-10-26 DIAGNOSIS — J343 Hypertrophy of nasal turbinates: Secondary | ICD-10-CM

## 2020-10-26 NOTE — Patient Instructions (Signed)
Thank you for coming in to see [place] today! Please see below to review our plan for today's visit:  1. Call [facility] ENT to get back in touch with them [phone]. 2. Take Flonase spray in the nose each side once daily. 3. You can get Debrox over the counter - use once weekly in your Right ear to reduce wax build up.   Please call the clinic at [phone] if your symptoms worsen or you have any concerns. It was our pleasure to serve you!   Dr. Peggyann Shoals [facility]

## 2020-10-26 NOTE — Assessment & Plan Note (Signed)
Appreciated again on physical exam.  Patient has been using saline mist but not been using her Flonase nasal spray. -Patient given the number for her [facility] ENT physician, [phone] -Recommended patient use Flonase to each nostril once daily in the meantime to reduce congestion

## 2020-10-26 NOTE — Assessment & Plan Note (Signed)
Patient declined repeat A1c check at today's visit -patient would benefit from lipid panel and BMP check as well as being started on Metformin

## 2020-10-26 NOTE — Assessment & Plan Note (Signed)
Appreciated on physical exam at today's visit, was able to irrigate and remove impacted cerumen -Normal TM appreciated on physical exam -Patient can use Debrox 1-2 times weekly to prevent cerumen buildup

## 2020-10-26 NOTE — Assessment & Plan Note (Signed)
>>  ASSESSMENT AND PLAN FOR HYPERTROPHY OF INFERIOR NASAL TURBINATE WRITTEN ON 10/26/2020  7:25 PM BY Peggyann Shoals C, DO  Appreciated again on physical exam.  Patient has been using saline mist but not been using her Flonase nasal spray. -Patient given the number for her [facility] ENT physician, [phone] -Recommended patient use Flonase to each nostril once daily in the meantime to reduce congestion

## 2020-10-26 NOTE — Progress Notes (Signed)
    SUBJECTIVE:   CHIEF COMPLAINT / HPI:   Sinus issues: Patient reports continued issues with her sinuses.  She reports that her right ear is stopped up in the right side of her face/cheek underneath her eye also feels stopped up, left side of her nose also feels clogged.  Patient has history of inferior nasal turbinate hypertrophy for which she has previously been seen by ENT.  Patient has been using saline mist at home to decrease her symptoms.  She denies any fevers, chills, shortness of breath, chest pain.  Reports clear discharge from her nose which occurs daily.  Cerumen impaction, right ear: Patient noted to have significant wax buildup in her right ear, this was able to be removed today with help of CMA team Clabe Seal and Alexis)  A1c: Last A1c 7.2% Feb 2021.  Patient is untreated for her diabetes.  Patient declined A1c testing at today's visit.  I explained to the patient that she would benefit from treating her diabetes as she continues her lifestyle modifications with the hopes of discontinuing medication down the line.  Patient reports that she is continuing to have difficulty with her low back and leg and is currently very involved with neurology/neurosurgery and would prefer to wait to a later date to check her A1c.  Health maintenance: Pneumo-vaccine, Flu shot, Colonoscopy.  Patient received COVID booster at local pharmacy on October 08, 2020 (Pfizer)  PERTINENT  PMH / PSH: Lumbar DDD, amaurosis fugax, diabetes, hypertension, anxiety, peripheral artery disease, HLD  OBJECTIVE:   BP 134/73   Pulse 65   Ht 5\' 6"  (1.676 m)   Wt 209 lb 12.8 oz (95.2 kg)   SpO2 97%   BMI 33.86 kg/m    Physical exam: General: Well-appearing patient, nontoxic HEENT: EOMI, PERRLA, right ear with cerumen impaction (after irrigation normal TM without bulging/erythema appreciated), left ear with patent external auditory canal normal-appearing TM without bulging/erythema; severely edematous turbinates  appreciated to bilateral nares Respiratory: CTA bilaterally, comfortable work of breathing Cardio: RRR, S1-S2 present, no murmurs appreciated  ASSESSMENT/PLAN:   Hypertrophy of inferior nasal turbinate Appreciated again on physical exam.  Patient has been using saline mist but not been using her Flonase nasal spray. -Patient given the number for her [facility] ENT physician, [phone] -Recommended patient use Flonase to each nostril once daily in the meantime to reduce congestion  Diabetes mellitus without complication (HCC) Patient declined repeat A1c check at today's visit -patient would benefit from lipid panel and BMP check as well as being started on Metformin  Impacted cerumen of right ear Appreciated on physical exam at today's visit, was able to irrigate and remove impacted cerumen -Normal TM appreciated on physical exam -Patient can use Debrox 1-2 times weekly to prevent cerumen buildup     [phone], DO [facility]

## 2020-11-01 DIAGNOSIS — J310 Chronic rhinitis: Secondary | ICD-10-CM | POA: Diagnosis not present

## 2020-11-01 DIAGNOSIS — J343 Hypertrophy of nasal turbinates: Secondary | ICD-10-CM | POA: Diagnosis not present

## 2020-11-06 ENCOUNTER — Other Ambulatory Visit: Payer: Self-pay | Admitting: Family Medicine

## 2020-11-06 DIAGNOSIS — M5441 Lumbago with sciatica, right side: Secondary | ICD-10-CM | POA: Diagnosis not present

## 2020-11-06 DIAGNOSIS — I10 Essential (primary) hypertension: Secondary | ICD-10-CM

## 2020-11-06 DIAGNOSIS — Z6833 Body mass index (BMI) 33.0-33.9, adult: Secondary | ICD-10-CM | POA: Diagnosis not present

## 2020-11-06 DIAGNOSIS — M5442 Lumbago with sciatica, left side: Secondary | ICD-10-CM | POA: Diagnosis not present

## 2020-11-06 DIAGNOSIS — G8929 Other chronic pain: Secondary | ICD-10-CM | POA: Diagnosis not present

## 2020-11-06 DIAGNOSIS — M4316 Spondylolisthesis, lumbar region: Secondary | ICD-10-CM | POA: Diagnosis not present

## 2020-11-06 DIAGNOSIS — M4807 Spinal stenosis, lumbosacral region: Secondary | ICD-10-CM | POA: Diagnosis not present

## 2020-12-11 ENCOUNTER — Other Ambulatory Visit (INDEPENDENT_AMBULATORY_CARE_PROVIDER_SITE_OTHER): Payer: Self-pay | Admitting: Nurse Practitioner

## 2020-12-11 DIAGNOSIS — I739 Peripheral vascular disease, unspecified: Secondary | ICD-10-CM

## 2020-12-12 DIAGNOSIS — M545 Low back pain, unspecified: Secondary | ICD-10-CM | POA: Diagnosis not present

## 2020-12-13 ENCOUNTER — Ambulatory Visit (INDEPENDENT_AMBULATORY_CARE_PROVIDER_SITE_OTHER): Payer: Medicaid Other | Admitting: Vascular Surgery

## 2020-12-13 ENCOUNTER — Encounter (INDEPENDENT_AMBULATORY_CARE_PROVIDER_SITE_OTHER): Payer: Self-pay | Admitting: Vascular Surgery

## 2020-12-13 ENCOUNTER — Other Ambulatory Visit: Payer: Self-pay

## 2020-12-13 ENCOUNTER — Ambulatory Visit (INDEPENDENT_AMBULATORY_CARE_PROVIDER_SITE_OTHER): Payer: Medicaid Other

## 2020-12-13 VITALS — BP 176/93 | HR 73 | Resp 16 | Wt 207.0 lb

## 2020-12-13 DIAGNOSIS — E782 Mixed hyperlipidemia: Secondary | ICD-10-CM

## 2020-12-13 DIAGNOSIS — I10 Essential (primary) hypertension: Secondary | ICD-10-CM | POA: Diagnosis not present

## 2020-12-13 DIAGNOSIS — K219 Gastro-esophageal reflux disease without esophagitis: Secondary | ICD-10-CM

## 2020-12-13 DIAGNOSIS — I739 Peripheral vascular disease, unspecified: Secondary | ICD-10-CM

## 2020-12-13 DIAGNOSIS — I70212 Atherosclerosis of native arteries of extremities with intermittent claudication, left leg: Secondary | ICD-10-CM

## 2020-12-13 NOTE — Progress Notes (Signed)
MRN : [id number]  Mariah [name] is a 60 y.o. (1960/10/18) female who presents with chief complaint of No chief complaint on file. Marland Kitchen  History of Present Illness:   The patient returns to the office for followup and review of the noninvasive studies. There have been no interval changes in lower extremity symptoms. No interval shortening of the patient's claudication distance or development of rest pain symptoms. No new ulcers or wounds have occurred since the last visit.  There have been no significant changes to the patient's overall health care.  The patient denies amaurosis fugax or recent TIA symptoms. There are no recent neurological changes noted. The patient denies history of DVT, PE or superficial thrombophlebitis. The patient denies recent episodes of angina or shortness of breath.   ABI Rt=1.14  and Lt=0.69  (previous ABI's Rt=0.92 and Lt=0.61) Previous duplex ultrasound of the right tibial arteries reveals triphasic waveforms with good toe waveforms.  Left lower extremity has monophasic waveforms with slightly dampened toe waveforms  No outpatient medications have been marked as taking for the 12/13/20 encounter (Appointment) with Gilda Crease, Latina Craver, MD.    Past Medical History:  Diagnosis Date  . Cramp of muscle of left upper extremity 06/24/2019  . Decreased peripheral vision of left eye 08/19/2019  . Degenerative disc disease, lumbar   . Ear pain 11/21/2019  . Elevated hemoglobin A1c 10/26/2019  . Elevated hemoglobin A1c 11/23/2019  . GERD (gastroesophageal reflux disease)   . Hypertension   . Insomnia 07/11/2010   Qualifier: Diagnosis of  By: Lelon Perla MD, Vickki Muff    . Mitral valve prolapse   . Nasal congestion 04/01/2019  . Numbness and tingling of right hand 01/29/2017  . Pain and swelling of right knee 12/29/2019  . Postprandial bloating 04/01/2019  . Prediabetes 10/24/2016  . Seizures (HCC) 06/23/2017  . Shortness of breath 11/21/2019    Past Surgical History:   Procedure Laterality Date  . ABDOMINAL HYSTERECTOMY    . ABDOMINAL HYSTERECTOMY    . BACK SURGERY    . CHOLECYSTECTOMY    . KNEE SURGERY      Social History Social History   Tobacco Use  . Smoking status: Former Smoker    Quit date: 09/16/1995    Years since quitting: 25.2  . Smokeless tobacco: Never Used  Substance Use Topics  . Alcohol use: Yes  . Drug use: Yes    Types: Marijuana    Family History Family History  Problem Relation Age of Onset  . Lymphoma Mother   . Lung cancer Father   . Pulmonary embolism Brother   . Colon cancer Neg Hx   . Esophageal cancer Neg Hx   . Liver cancer Neg Hx   . Pancreatic cancer Neg Hx   . Rectal cancer Neg Hx   . Stomach cancer Neg Hx     Allergies  Allergen Reactions  . Lisinopril     REACTION: COUGH  . Moxifloxacin     REACTION: N \\T \ V  . Sulfa Antibiotics     Other reaction(s): Other (See Comments), Other (See Comments) Pt cannot remember rxn Pt cannot remember rxn   . Sulfonamide Derivatives Other (See Comments)    Pt cannot remember rxn     REVIEW OF SYSTEMS (Negative unless checked)  Constitutional: [] Weight loss  [] Fever  [] Chills Cardiac: [] Chest pain   [] Chest pressure   [] Palpitations   [] Shortness of breath when laying flat   [] Shortness of breath with exertion. Vascular:  [x] Pain  in legs with walking   [] Pain in legs at rest  [] History of DVT   [] Phlebitis   [] Swelling in legs   [] Varicose veins   [] Non-healing ulcers Pulmonary:   [] Uses home oxygen   [] Productive cough   [] Hemoptysis   [] Wheeze  [] COPD   [] Asthma Neurologic:  [] Dizziness   [] Seizures   [] History of stroke   [] History of TIA  [] Aphasia   [] Vissual changes   [] Weakness or numbness in arm   [] Weakness or numbness in leg Musculoskeletal:   [] Joint swelling   [] Joint pain   [x] Low back pain Hematologic:  [] Easy bruising  [] Easy bleeding   [] Hypercoagulable state   [] Anemic Gastrointestinal:  [] Diarrhea   [] Vomiting  [x] Gastroesophageal  reflux/heartburn   [] Difficulty swallowing. Genitourinary:  [] Chronic kidney disease   [] Difficult urination  [] Frequent urination   [] Blood in urine Skin:  [] Rashes   [] Ulcers  Psychological:  [] History of anxiety   []  History of major depression.  Physical Examination  There were no vitals filed for this visit. There is no height or weight on file to calculate BMI. Gen: WD/WN, NAD Head: Wallace/AT, No temporalis wasting.  Ear/Nose/Throat: Hearing grossly intact, nares w/o erythema or drainage Eyes: PER, EOMI, sclera nonicteric.  Neck: Supple, no large masses.   Pulmonary:  Good air movement, no audible wheezing bilaterally, no use of accessory muscles.  Cardiac: RRR, no JVD Vascular:  Vessel Right Left  Radial Palpable Palpable  PT Not Palpable Not Palpable  DP Not Palpable Not Palpable  Gastrointestinal: Non-distended. No guarding/no peritoneal signs.  Musculoskeletal: M/S 5/5 throughout.  No deformity or atrophy.  Neurologic: CN 2-12 intact. Symmetrical.  Speech is fluent. Motor exam as listed above. Psychiatric: Judgment intact, Mood & affect appropriate for pt's clinical situation. Dermatologic: No rashes or ulcers noted.  No changes consistent with cellulitis.   CBC Lab Results  Component Value Date   WBC 7.8 10/11/2018   HGB 13.0 10/11/2018   HCT 40.1 10/11/2018   MCV 83 10/11/2018   PLT 238 10/11/2018    BMET    Component Value Date/Time   NA 141 10/21/2019 1625   K 4.1 10/21/2019 1625   CL 100 10/21/2019 1625   CO2 26 10/21/2019 1625   GLUCOSE 121 (H) 10/21/2019 1625   GLUCOSE 100 (H) 06/23/2017 1005   BUN 12 10/21/2019 1625   CREATININE 0.75 10/21/2019 1625   CREATININE 0.80 11/02/2015 1433   CALCIUM 9.3 10/21/2019 1625   GFRNONAA 88 10/21/2019 1625   GFRNONAA 84 11/02/2015 1433   GFRAA 102 10/21/2019 1625   GFRAA >89 11/02/2015 1433   CrCl cannot be calculated (Patient's most recent lab result is older than the maximum 21 days allowed.).  COAG Lab  Results  Component Value Date   INR 1.0 06/23/2008    Radiology No results found.   Assessment/Plan 1. Atherosclerosis of native artery of left lower extremity with intermittent claudication (HCC)  Recommend:  The patient has evidence of atherosclerosis of the lower extremities with claudication.  The patient does not voice lifestyle limiting changes at this point in time.  Noninvasive studies do not suggest clinically significant change.  No invasive studies, angiography or surgery at this time The patient should continue walking and begin a more formal exercise program.  The patient should continue antiplatelet therapy and aggressive treatment of the lipid abnormalities  No changes in the patient's medications at this time  - VAS ABI WITH/WO TBI; Future  2. HYPERTENSION, BENIGN Continue antihypertensive medications  as already ordered, these medications have been reviewed and there are no changes at this time.   3. Gastroesophageal reflux disease, unspecified whether esophagitis present Continue PPI as already ordered, this medication has been reviewed and there are no changes at this time.  Avoidence of caffeine and alcohol  Moderate elevation of the head of the bed   4. Mixed hyperlipidemia Continue statin as ordered and reviewed, no changes at this time    Levora Dredge, MD  12/13/2020 8:43 AM

## 2021-01-05 ENCOUNTER — Other Ambulatory Visit: Payer: Self-pay | Admitting: Family Medicine

## 2021-01-08 NOTE — Progress Notes (Signed)
SUBJECTIVE:   CHIEF COMPLAINT / HPI:   Shoulder pain: occurring in both left and right shoulder. Started in L shoulder after she had her COVID booster shot in January 2022. Had history of rotator cuff issues in R shoulder. She has a history of carpel tunnel in R arm. Her L arm aches especially at night. No accidents, falls or traumas. Goes from front of L deltoid down into bicep. Does not hurt her during the day, hurts at night. She is usually able to perform ADLs with ease; however, sometimes her arms are so achy the ADLs become more difficult   Sinus issues: was seen by ENT in February 2022. She used flonase daily x2 months and felt better. She was called by ENT last week, was going to do Head CT. Patient denied at the time but shiortly after started having stuffy nose. She used her humidifier last night and slept every where. She is still using flonase 2 sprays each nostril daily. Respectfully declines oral allergy medicine due to previous side effects (Zyrtec was very drying).   Stomach pain: started acting up 2-3 weeks ago. She would feel nauseous, she she eats she feels very bloated. She feels like she needs to pass gas or burp but has a hard time doing so. She believes she had food poisoning on April 19th. Around 3:30pm she had eaten at a restaurant, 1 hour later she felt very sick, vomited and had diarrhea back to back for about an hour and then after that was fine. She ate green mussels and crab legs. She was by herself so she cannot say if anyone else had issues. She sometimes has burning sensation into her upper abdomen. She has never had colonoscopy but is interested in doing so.   PERTINENT  PMH / PSH:  Patient Active Problem List   Diagnosis Date Noted  . Chronic pain of both shoulders 01/10/2021  . 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 Colon cancer screening 01/09/2021  . Impacted cerumen of right ear 10/26/2020  . High priority for COVID-19 virus vaccination 04/11/2020  .  Atherosclerosis of native arteries of extremity with intermittent claudication (HCC) 11/04/2019  . Hypertrophy of inferior nasal turbinate 07/07/2019  . Eczema 02/11/2018  . 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  . Hyperlipidemia 06/26/2017  . TIA (transient ischemic attack) 06/23/2017  . Health care maintenance 10/24/2016  . Callus of foot 04/29/2016  . Diabetes mellitus without complication (HCC) 11/02/2015  . Status post cervical spinal fusion 04/27/2015  . Cervical myelopathy (HCC) 12/22/2014  . 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 Degenerative disc disease, lumbar   . Mitral valve prolapse   . OBESITY 02/13/2009  . HYPERTENSION, BENIGN 08/07/2008  . Anxiety 02/26/2007  . Allergic rhinitis 02/26/2007  . GERD 02/26/2007    OBJECTIVE:   BP (!) 173/70   Pulse 77   Ht 5\' 6"  (1.676 m)   Wt 208 lb 6.4 oz (94.5 kg)   SpO2 99%   BMI 33.64 kg/m    Physical exam: General: well appearing, pleasant patient Respiratory: comfortable work of breathing on room air Abdomen: normal bowel sounds, soft, nontender to palpation, no masses appreciated Shoulders:  Inspection: no obvious deformity, atrophy, or asymmetry. No bruising. No swelling Palpation: R shoulder with mild tenderness over anterior deltoid/bicipital groove; no TTP to [facility] joint or Left shoulder ROM: full in flexion, abduction, internal/external rotation NV intact distally Normal scapular function observed. Special Tests:  - Impingement: Neg neers, empty can sign. - Supraspinatous:  Negative empty can.  5/5 strength with resisted flexion at 20 degrees - Infraspinatous/Teres Minor: 5/5 strength with ER - Subscapularis: 5/5 strength with IR - Biceps tendon: mild tenderness with Yerrgason's on R; negative on Left  - Labrum: Negative Obriens, negative clunk, good stability - AC Joint: Negative cross arm - No painful arc and no drop arm sign  ASSESSMENT/PLAN:   Allergic rhinitis Patient with longstanding history of  sinus pain/fullness, allergic rhinitis. She was being followed by ENT who had recommended Maxillofacial CT. At the time patient declined because she was feeling better; however her symptoms have since returned.  -Ms. Marte was given her ENT's name and number to call to ask about possibility of Maxillofacial CT.  -Continue Flonase daily -Declined Zyrtec/2nd-gen antihistamine  Functional dyspepsia Patient experiencing sensation of bloating, gassiness occasionally after meals.  Symptoms are relieved with time, walking which helps to relieve the gassiness.  Denies vomiting, diarrhea, blood in stool.  Symptoms are not relieved with bowel movement.  Patient does have a history of GERD, and she does describe burning sensation in epigastric region.  Also concern for functional dyspepsia.  Low concern for gastritis, H. pylori, ulcer, gastric bleed cholecystitis, pancreatitis. -Patient to follow-up with GI for colonoscopy; may also be able to be seen for dyspepsia/GERD -Recommend patient continues to take omeprazole as this helps to relieve symptoms  Colon cancer screening Patient due for colon cancer screening via colonoscopy. -Referral to GI made for colonoscopy  Chronic pain of both shoulders Patient experiencing chronic pain of both shoulders.  Right shoulder with history of rotator cuff issues/surgery.  Left shoulder pain started after patient received COVID-vaccine booster in January 2022.  Right shoulder with mild tenderness to palpation of bicipital groove.  No tenderness to palpation of left shoulder.  Physical exam and special tests grossly normal with bilateral shoulders. -Patient given 2 exercises to do at home to help strengthen her shoulders and improve range of motion -If tenderness/functionality does not improve or if it gets worse, can consider referral to sports medicine for further evaluation and treatment -Strict return precautions provided     Dollene Cleveland, DO [facility]  [facility]

## 2021-01-08 NOTE — Patient Instructions (Signed)
Thank you for coming in to see [place] today! Please see below to review our plan for today's visit:  1. Number for [facility]: [phone]. Call to schedule Head CT for sinuses.  2. Try shoulder presses for 1 minutes 2-3 times daily with light weights to help build shoulder muscles. Do shoulder twists.  3.  Encounter Start Encounter End  01/25/2021 9:20 AM 01/25/2021 9:40 AM   Leveda Anna, Fredrich Birks, OD  Optometry  NPI: [id number]  [address]  [place] [address]    Phone: [phone]  Fax: [phone]  4. I have placed referral for Gastro for Colonoscopy.   Please call the clinic at [phone] if your symptoms worsen or you have any concerns. It was our pleasure to serve you!   Dr. Peggyann Shoals [facility]

## 2021-01-09 ENCOUNTER — Other Ambulatory Visit: Payer: Self-pay

## 2021-01-09 ENCOUNTER — Ambulatory Visit: Payer: Medicaid Other | Admitting: Family Medicine

## 2021-01-09 ENCOUNTER — Encounter: Payer: Self-pay | Admitting: Family Medicine

## 2021-01-09 VITALS — BP 173/70 | HR 77 | Ht 66.0 in | Wt 208.4 lb

## 2021-01-09 DIAGNOSIS — G8929 Other chronic pain: Secondary | ICD-10-CM

## 2021-01-09 DIAGNOSIS — J301 Allergic rhinitis due to pollen: Secondary | ICD-10-CM | POA: Diagnosis not present

## 2021-01-09 DIAGNOSIS — M25511 Pain in right shoulder: Secondary | ICD-10-CM

## 2021-01-09 DIAGNOSIS — K30 Functional dyspepsia: Secondary | ICD-10-CM | POA: Diagnosis not present

## 2021-01-09 DIAGNOSIS — Z1211 Encounter for screening for malignant neoplasm of colon: Secondary | ICD-10-CM | POA: Diagnosis present

## 2021-01-09 DIAGNOSIS — M25512 Pain in left shoulder: Secondary | ICD-10-CM

## 2021-01-10 DIAGNOSIS — M25511 Pain in right shoulder: Secondary | ICD-10-CM | POA: Insufficient documentation

## 2021-01-10 DIAGNOSIS — K30 Functional dyspepsia: Secondary | ICD-10-CM | POA: Insufficient documentation

## 2021-01-10 DIAGNOSIS — G8929 Other chronic pain: Secondary | ICD-10-CM | POA: Insufficient documentation

## 2021-01-10 NOTE — Assessment & Plan Note (Signed)
Patient due for colon cancer screening via colonoscopy. -Referral to GI made for colonoscopy

## 2021-01-10 NOTE — Assessment & Plan Note (Signed)
Patient experiencing chronic pain of both shoulders.  Right shoulder with history of rotator cuff issues/surgery.  Left shoulder pain started after patient received COVID-vaccine booster in January 2022.  Right shoulder with mild tenderness to palpation of bicipital groove.  No tenderness to palpation of left shoulder.  Physical exam and special tests grossly normal with bilateral shoulders. -Patient given 2 exercises to do at home to help strengthen her shoulders and improve range of motion -If tenderness/functionality does not improve or if it gets worse, can consider referral to sports medicine for further evaluation and treatment -Strict return precautions provided

## 2021-01-10 NOTE — Assessment & Plan Note (Signed)
Patient with longstanding history of sinus pain/fullness, allergic rhinitis. She was being followed by ENT who had recommended Maxillofacial CT. At the time patient declined because she was feeling better; however her symptoms have since returned.  -Ms. Micek was given her ENT's name and number to call to ask about possibility of Maxillofacial CT.  -Continue Flonase daily -Declined Zyrtec/2nd-gen antihistamine

## 2021-01-10 NOTE — Assessment & Plan Note (Signed)
Patient experiencing sensation of bloating, gassiness occasionally after meals.  Symptoms are relieved with time, walking which helps to relieve the gassiness.  Denies vomiting, diarrhea, blood in stool.  Symptoms are not relieved with bowel movement.  Patient does have a history of GERD, and she does describe burning sensation in epigastric region.  Also concern for functional dyspepsia.  Low concern for gastritis, H. pylori, ulcer, gastric bleed cholecystitis, pancreatitis. -Patient to follow-up with GI for colonoscopy; may also be able to be seen for dyspepsia/GERD -Recommend patient continues to take omeprazole as this helps to relieve symptoms

## 2021-01-10 NOTE — Assessment & Plan Note (Signed)
>>  ASSESSMENT AND PLAN FOR COLON CANCER SCREENING WRITTEN ON 01/10/2021  6:56 AM BY Peggyann Shoals C, DO  Patient due for colon cancer screening via colonoscopy. -Referral to GI made for colonoscopy

## 2021-01-18 DIAGNOSIS — M542 Cervicalgia: Secondary | ICD-10-CM | POA: Diagnosis not present

## 2021-01-18 DIAGNOSIS — M545 Low back pain, unspecified: Secondary | ICD-10-CM | POA: Diagnosis not present

## 2021-01-22 ENCOUNTER — Encounter: Payer: Self-pay | Admitting: Family Medicine

## 2021-01-22 ENCOUNTER — Ambulatory Visit: Payer: Medicaid Other | Admitting: Family Medicine

## 2021-01-22 ENCOUNTER — Other Ambulatory Visit (HOSPITAL_COMMUNITY)
Admission: RE | Admit: 2021-01-22 | Discharge: 2021-01-22 | Disposition: A | Discharge status: Home / Self Care | Payer: Medicaid Other | Source: Ambulatory Visit | Attending: Family Medicine | Admitting: Family Medicine

## 2021-01-22 ENCOUNTER — Other Ambulatory Visit: Payer: Self-pay

## 2021-01-22 VITALS — BP 138/74 | HR 80 | Ht 66.0 in | Wt 205.0 lb

## 2021-01-22 DIAGNOSIS — E119 Type 2 diabetes mellitus without complications: Secondary | ICD-10-CM

## 2021-01-22 DIAGNOSIS — Z7251 High risk heterosexual behavior: Secondary | ICD-10-CM | POA: Insufficient documentation

## 2021-01-22 DIAGNOSIS — Z113 Encounter for screening for infections with a predominantly sexual mode of transmission: Secondary | ICD-10-CM | POA: Diagnosis not present

## 2021-01-22 LAB — POCT GLYCOSYLATED HEMOGLOBIN (HGB A1C): HbA1c, POC (controlled diabetic range): 7.1 % — AB (ref 0.0–7.0)

## 2021-01-22 NOTE — Assessment & Plan Note (Signed)
A1c today improved at 7.1%, patient remains off medications specifically for her diabetes. -Continue losartan 25 mg daily for blood pressure, renal protection -Continue Lipitor 80 mg daily to reduce ASCVD risk -Patient instructed to come back in 3 months for repeat A1c, would benefit from BMP at this time -Patient instructed that if her A1c worsens she would need to be started on medication such as metformin

## 2021-01-22 NOTE — Patient Instructions (Signed)
Thank you for coming in to see [place] today! Please see below to review our plan for today's visit:  1.  It was great to see you today!  We have checked for gonorrhea/chlamydia on testing.  We are also drawing blood to test for syphilis and HIV.  I will contact you with your results. 2.  Congratulations on your lower A1c of 7.1%!  Continue your Lipitor and losartan and your good dietary modifications and lifestyle changes to help keep this number low.  Please follow-up for your diabetes in 90 days / 3 months.  We may consider starting you on a medication for diabetes if your A1c goes up.   Please call the clinic at [phone] if your symptoms worsen or you have any concerns. It was our pleasure to serve you!   Dr. Peggyann Shoals [facility]

## 2021-01-22 NOTE — Progress Notes (Signed)
    SUBJECTIVE:   CHIEF COMPLAINT / HPI:   STI screen: Patient presents to clinic today for STI screening.  She reports that she has a new sexual partner and like to be screened for STIs, including gonorrhea, chlamydia.  She also agrees to have blood work to assess HIV, syphilis.  She denies any symptoms or vaginal discharge.  Patient has a history of hysterectomy.  Diabetes: Patient has a history of type 2 diabetes for which she is not taking any medications but is rather trying to control with diet and lifestyle modifications.  Her last A1c was 7.2%, today it is improved at 7.1%.  She is on losartan for renal protection (history of cough with lisinopril) and is on Lipitor 80 mg daily.   PERTINENT  PMH / PSH:  T2DM, Amaurosis fugax, Anxiety  OBJECTIVE:   BP 138/74   Pulse 80   Ht 5\' 6"  (1.676 m)   Wt 205 lb (93 kg)   BMI 33.09 kg/m    Physical exam: General: Well-appearing patient, no apparent distress Respiratory: Comfortable work of breathing on room air GU: Normal-appearing labia minora and majora without lesion, normal-appearing thin vaginal discharge, somewhat atrophied vaginal rugae, no lesions appreciated, no cervix as patient is s/p hysterectomy   ASSESSMENT/PLAN:   Diabetes mellitus without complication (HCC) A1c today improved at 7.1%, patient remains off medications specifically for her diabetes. -Continue losartan 25 mg daily for blood pressure, renal protection -Continue Lipitor 80 mg daily to reduce ASCVD risk -Patient instructed to come back in 3 months for repeat A1c, would benefit from BMP at this time -Patient instructed that if her A1c worsens she would need to be started on medication such as metformin  Routine screening for STI (sexually transmitted infection) -Routine screening for STIs performed at today's visit for gonorrhea, chlamydia, trichomonas, HIV, and syphilis -We will follow-up with results and treat as needed    , DO [facility]  [facility]

## 2021-01-22 NOTE — Assessment & Plan Note (Signed)
-  Routine screening for STIs performed at today's visit for gonorrhea, chlamydia, trichomonas, HIV, and syphilis -We will follow-up with results and treat as needed

## 2021-01-23 LAB — RPR: RPR Ser Ql: NONREACTIVE

## 2021-01-23 LAB — CERVICOVAGINAL ANCILLARY ONLY
Chlamydia: NEGATIVE
Comment: NEGATIVE
Comment: NEGATIVE
Comment: NORMAL
Neisseria Gonorrhea: NEGATIVE
Trichomonas: NEGATIVE

## 2021-01-23 LAB — HIV ANTIBODY (ROUTINE TESTING W REFLEX): HIV Screen 4th Generation wRfx: NONREACTIVE

## 2021-02-05 DIAGNOSIS — J310 Chronic rhinitis: Secondary | ICD-10-CM | POA: Diagnosis not present

## 2021-02-05 DIAGNOSIS — J320 Chronic maxillary sinusitis: Secondary | ICD-10-CM | POA: Diagnosis not present

## 2021-02-05 DIAGNOSIS — J3489 Other specified disorders of nose and nasal sinuses: Secondary | ICD-10-CM | POA: Diagnosis not present

## 2021-02-05 DIAGNOSIS — J342 Deviated nasal septum: Secondary | ICD-10-CM | POA: Diagnosis not present

## 2021-02-05 DIAGNOSIS — J343 Hypertrophy of nasal turbinates: Secondary | ICD-10-CM | POA: Diagnosis not present

## 2021-02-14 DIAGNOSIS — J310 Chronic rhinitis: Secondary | ICD-10-CM | POA: Diagnosis not present

## 2021-02-14 DIAGNOSIS — J343 Hypertrophy of nasal turbinates: Secondary | ICD-10-CM | POA: Diagnosis not present

## 2021-02-18 ENCOUNTER — Telehealth: Payer: Self-pay | Admitting: *Deleted

## 2021-02-18 ENCOUNTER — Other Ambulatory Visit: Payer: Self-pay | Admitting: Family Medicine

## 2021-02-18 DIAGNOSIS — J301 Allergic rhinitis due to pollen: Secondary | ICD-10-CM

## 2021-02-18 NOTE — Telephone Encounter (Signed)
Referral placed. Mariah Strassner, MD  

## 2021-02-18 NOTE — Telephone Encounter (Signed)
Patient is calling to request a referral be placed by PCP for [facility].  Patient was advised by her ENT that she should see them for her nasal congestion.  Patient has medicaid and they require the PCP office to place referrals.  Will forward to MD to sign pended referral in this encounter.  Celisa Schoenberg,CMA

## 2021-02-22 ENCOUNTER — Other Ambulatory Visit: Payer: Self-pay | Admitting: Family Medicine

## 2021-02-22 DIAGNOSIS — E782 Mixed hyperlipidemia: Secondary | ICD-10-CM

## 2021-02-24 NOTE — Progress Notes (Signed)
SUBJECTIVE:   CHIEF COMPLAINT / HPI:   Left shoulder: patient with achy left shoulder pain that acts up occasionally. She has history of calcification of rotator cuff tendon/tendonitis of left shoulder from years prior, has had no recent falls, injuries or trauma to shoulder. She sleeps on her left side/shoulder and that aggravates her pain.   Right hand: involuntary movement of ring finger and thumb, has been going on for a while (months and months), ring finger happening for 2 weeks. Finger starts to jump. It doesn't do it every day but is happening more regularly. Maybe happens 3-5 times weekly, sometimes it's both at the same time, sometimes it's just one. Occurs at random times of day. No known triggers. She drinks decaf coffee and does not drink it daily.   PERTINENT  PMH / PSH:  Patient Active Problem List   Diagnosis Date Noted   Left shoulder tendinitis 02/25/2021   Fasciculation 02/25/2021   Routine screening for STI (sexually transmitted infection) 01/22/2021   Chronic pain of both shoulders 01/10/2021   Functional dyspepsia 01/10/2021   Colon cancer screening 01/09/2021   Impacted cerumen of right ear 10/26/2020   High priority for COVID-19 virus vaccination 04/11/2020   Atherosclerosis of native arteries of extremity with intermittent claudication (HCC) 11/04/2019   Hypertrophy of inferior nasal turbinate 07/07/2019   Eczema 02/11/2018   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   Hyperlipidemia 06/26/2017   TIA (transient ischemic attack) 06/23/2017   Health care maintenance 10/24/2016   Callus of foot 04/29/2016   Diabetes mellitus without complication (HCC) 11/02/2015   Status post cervical spinal fusion 04/27/2015   Cervical myelopathy (HCC) 12/22/2014   Amaurosis fugax 08/16/2013   Degenerative disc disease, lumbar    Mitral valve prolapse    OBESITY 02/13/2009   HYPERTENSION, BENIGN 08/07/2008   Anxiety 02/26/2007   Allergic rhinitis 02/26/2007    GERD 02/26/2007     OBJECTIVE:   BP 112/60   Pulse 82   Wt 206 lb 6 oz (93.6 kg)   SpO2 100%   BMI 33.31 kg/m    Physical exam: General: well appearing, no apparent distress Respiratory: CTA bilaterally, comfortable work of breathing on room air Left shoulder:  Inspection reveals no obvious deformity, atrophy, or asymmetry. No bruising. No swelling Palpation is normal with no TTP over [facility] joint or bicipital groove. Full ROM in flexion, abduction, internal/external rotation NV intact distally Special Tests:  - Impingement: Neg neers, empty can sign. - Supraspinatous: Negative empty can.  5/5 strength with resisted flexion at 20 degrees - Infraspinatous/Teres Minor: 5/5 strength with ER. Negative pain with resisted ER - Subscapularis: negative belly press, negative bear hug. 5/5 strength with IR, Negative pain with resisted IR - Biceps tendon: negative Yerrgason's  - Labrum: Negative Obriens - AC Joint: Negative cross arm - No painful arc and no drop arm sign  Right hand: without abnormality or deformity to inspection, no TTP, full ROM in fingers and wrist   ASSESSMENT/PLAN:   Fasciculation Concern for fasciculation of right hand, slowly becoming more frequent. Could be due to stressors, electrolyte abnormalities, or medication side effect. Not happening any where else on body. At this time doubt UMN disease such as ALS. -Continue to monitor -Patient has other neurological issues (myelopathy) and is requesting referral to neurologist, recommend patient also be evaluated for fasciculations   Left shoulder tendinitis No recent falls/traumas. Patient sleeping on left shoulder worsens pain.  -Continue home exercises -Recommend following up  with [facility] (patient's orthopedist) -Voltaren gel 4x daily     Dollene Cleveland, DO [facility]

## 2021-02-24 NOTE — Patient Instructions (Signed)
Thank you for coming in to see Mariah [name] today! Please see below to review our plan for today's visit:  1. Apply voltaren gel to your left shoulder up to 4 times daily. You can call [facility] as you need.  2. I have placed a referral to a local neurologist for myelopathy and your "twitches"/fasciculations.   Please call the clinic at [phone] if your symptoms worsen or you have any concerns. It was our pleasure to serve you!   Dr. Peggyann Shoals [facility]

## 2021-02-25 ENCOUNTER — Encounter: Payer: Self-pay | Admitting: Family Medicine

## 2021-02-25 ENCOUNTER — Other Ambulatory Visit: Payer: Self-pay

## 2021-02-25 ENCOUNTER — Ambulatory Visit: Payer: Medicaid Other | Admitting: Family Medicine

## 2021-02-25 VITALS — BP 112/60 | HR 82 | Wt 206.4 lb

## 2021-02-25 DIAGNOSIS — R253 Fasciculation: Secondary | ICD-10-CM | POA: Diagnosis not present

## 2021-02-25 DIAGNOSIS — M778 Other enthesopathies, not elsewhere classified: Secondary | ICD-10-CM | POA: Insufficient documentation

## 2021-02-25 MED ORDER — DICLOFENAC SODIUM 1 % EX GEL: 2.0000 g | Freq: Four times a day (QID) | CUTANEOUS | 2 refills | Status: DC

## 2021-02-25 NOTE — Assessment & Plan Note (Signed)
Concern for fasciculation of right hand, slowly becoming more frequent. Could be due to stressors, electrolyte abnormalities, or medication side effect. Not happening any where else on body. At this time doubt UMN disease such as ALS. -Continue to monitor -Patient has other neurological issues (myelopathy) and is requesting referral to neurologist, recommend patient also be evaluated for fasciculations

## 2021-02-25 NOTE — Assessment & Plan Note (Signed)
No recent falls/traumas. Patient sleeping on left shoulder worsens pain.  -Continue home exercises -Recommend following up with [facility] (patient's orthopedist) -Voltaren gel 4x daily

## 2021-03-05 ENCOUNTER — Encounter (HOSPITAL_COMMUNITY): Payer: Self-pay | Admitting: Emergency Medicine

## 2021-03-05 ENCOUNTER — Emergency Department (HOSPITAL_COMMUNITY)
Admission: EM | Admit: 2021-03-05 | Discharge: 2021-03-05 | Disposition: A | Discharge status: Home / Self Care | Payer: Medicaid Other | Attending: Emergency Medicine | Admitting: Emergency Medicine

## 2021-03-05 DIAGNOSIS — Z7902 Long term (current) use of antithrombotics/antiplatelets: Secondary | ICD-10-CM | POA: Insufficient documentation

## 2021-03-05 DIAGNOSIS — Z79899 Other long term (current) drug therapy: Secondary | ICD-10-CM | POA: Insufficient documentation

## 2021-03-05 DIAGNOSIS — Z87891 Personal history of nicotine dependence: Secondary | ICD-10-CM | POA: Diagnosis not present

## 2021-03-05 DIAGNOSIS — M5459 Other low back pain: Secondary | ICD-10-CM | POA: Diagnosis not present

## 2021-03-05 DIAGNOSIS — M545 Low back pain, unspecified: Secondary | ICD-10-CM | POA: Insufficient documentation

## 2021-03-05 DIAGNOSIS — M79659 Pain in unspecified thigh: Secondary | ICD-10-CM | POA: Diagnosis not present

## 2021-03-05 DIAGNOSIS — G8929 Other chronic pain: Secondary | ICD-10-CM

## 2021-03-05 MED ORDER — METHOCARBAMOL 500 MG PO TABS
500.0000 mg | ORAL_TABLET | Freq: Once | ORAL | Status: AC
  Administered 2021-03-05: 500 mg via ORAL
  Filled 2021-03-05: qty 1

## 2021-03-05 MED ORDER — KETOROLAC TROMETHAMINE 30 MG/ML IJ SOLN
30.0000 mg | Freq: Once | INTRAMUSCULAR | Status: AC
  Administered 2021-03-05: 30 mg via INTRAMUSCULAR
  Filled 2021-03-05: qty 1

## 2021-03-05 MED ORDER — HYDROMORPHONE HCL 1 MG/ML IJ SOLN
1.0000 mg | Freq: Once | INTRAMUSCULAR | Status: AC
  Administered 2021-03-05: 1 mg via INTRAMUSCULAR
  Filled 2021-03-05: qty 1

## 2021-03-05 MED ORDER — ACETAMINOPHEN 325 MG PO TABS
650.0000 mg | ORAL_TABLET | Freq: Once | ORAL | Status: AC
  Administered 2021-03-05: 650 mg via ORAL
  Filled 2021-03-05: qty 2

## 2021-03-05 NOTE — ED Triage Notes (Signed)
Pt arrive POV from home for c/o chronic back that is getting worse on the past 3 days, denies any fall or injury.

## 2021-03-05 NOTE — ED Provider Notes (Signed)
Emergency Medicine Provider Triage Evaluation Note  Mariah [name] , a 60 y.o. female  was evaluated in triage.  Pt complains of chronic low back pain secondary degenerative disc disease who presents with concern for worsening low back pain x3 days.  She feels that her back pain is worse after wearing heels on Saturday to a party.  States that this all started on Sunday and she lives alone and has been unable to ambulate or sit on the toilet secondary to the pain.  Denies any increased numbness or tingling in her lower extremities denies any weakness in her legs.  Pain not relieved with Percocet at home  Review of Systems  Positive: Low back pain radiating into the backs of both legs. Negative: Numbness, tingling, weakness.  Physical Exam  There were no vitals taken for this visit. Gen:   Awake, no distress   Resp:  Normal effort  MSK:   Moves extremities without difficulty  Other:  Symmetric strength in plantar and dorsiflexion bilaterally.  Positive straight leg raise bilaterally.  Patient is neurovascular intact in lower extremities bilaterally.  Medical Decision Making  Medically screening exam initiated at 7:46 PM.  Appropriate orders placed.  Mariah [name] was informed that the remainder of the evaluation will be completed by another provider, this initial triage assessment does not replace that evaluation, and the importance of remaining in the ED until their evaluation is complete.  Analgesia offered with Tylenol in triage, patient took Percocet a few hours prior to arrival.  On Plavix for anticoagulation.  This chart was dictated using voice recognition software, Dragon. Despite the best efforts of this provider to proofread and correct errors, errors may still occur which can change documentation meaning.    Sherrilee Gilles 03/05/21 1949    Pollyann Savoy, MD 03/05/21 2249

## 2021-03-05 NOTE — ED Notes (Signed)
Discharge instructions discussed with pt. Pt verbalized understanding with no questions at this time. Pt to go home with friend 

## 2021-03-05 NOTE — ED Provider Notes (Signed)
[place] EMERGENCY DEPARTMENT Provider Note  CSN: [id number] Arrival date & time: 03/05/21 1809    History Chief Complaint  Patient presents with   Back Pain     Back Pain  Mariah [name] is a 60 y.o. female with history of chronic back pain, maintained on percocet at home reports 3 days of worsening R lower back pain after wearing high heels at her high school reunion last weekend. Pain is associated with aching thigh but no numbness or weakness. No saddle paresthesia or incontinence. No falls or injuries.    Past Medical History:  Diagnosis Date   Cramp of muscle of left upper extremity 06/24/2019   Decreased peripheral vision of left eye 08/19/2019   Degenerative disc disease, lumbar    Ear pain 11/21/2019   Elevated hemoglobin A1c 10/26/2019   Elevated hemoglobin A1c 11/23/2019   GERD (gastroesophageal reflux disease)    Hypertension    Insomnia 07/11/2010   Qualifier: Diagnosis of  By: Tye Savoy MD, [facility]     Mitral valve prolapse    Nasal congestion 04/01/2019   Numbness and tingling of right hand 01/29/2017   Pain and swelling of right knee 12/29/2019   Postprandial bloating 04/01/2019   Prediabetes 10/24/2016   Seizures ([place]) 06/23/2017   Shortness of breath 11/21/2019    Past Surgical History:  Procedure Laterality Date   ABDOMINAL HYSTERECTOMY     ABDOMINAL HYSTERECTOMY     BACK SURGERY     CHOLECYSTECTOMY     KNEE SURGERY      Family History  Problem Relation Age of Onset   Lymphoma Mother    Lung cancer Father    Pulmonary embolism Brother    Colon cancer Neg Hx    Esophageal cancer Neg Hx    Liver cancer Neg Hx    Pancreatic cancer Neg Hx    Rectal cancer Neg Hx    Stomach cancer Neg Hx     Social History   Tobacco Use   Smoking status: Former    Pack years: 0.00    Types: Cigarettes    Quit date: 09/16/1995    Years since quitting: 25.4   Smokeless tobacco: Never  Substance Use Topics   Alcohol use: Yes   Drug use: Yes    Types:  Marijuana     Home Medications Prior to Admission medications   Medication Sig Start Date End Date Taking? Authorizing Provider  atorvastatin (LIPITOR) 80 MG tablet TAKE 1 TABLET(80 MG) BY MOUTH DAILY 01/07/21   [name], DO  Blood Pressure KIT Check BP every other day, keep journal. 10/21/19   Milus Banister C, DO  clopidogrel (PLAVIX) 75 MG tablet TAKE 1 TABLET(75 MG) BY MOUTH DAILY 10/26/20   Milus Banister C, DO  diclofenac Sodium (VOLTAREN) 1 % GEL Apply 2 g topically 4 (four) times daily. 02/25/21   [name], DO  fluticasone (FLONASE) 50 MCG/ACT nasal spray INSTILL 2 SPRAY IN [facility] NOSTRIL EVERY DAY 12/01/18   Myles Gip, DO  hydrochlorothiazide (HYDRODIURIL) 25 MG tablet TAKE 1 TABLET(25 MG) BY MOUTH DAILY 01/07/21   Milus Banister C, DO  losartan (COZAAR) 25 MG tablet TAKE 1 TABLET(25 MG) BY MOUTH AT BEDTIME 11/06/20   Milus Banister C, DO  omeprazole (PRILOSEC) 40 MG capsule TAKE 1 CAPSULE(40 MG) BY MOUTH DAILY 09/12/20   [name], DO  oxyCODONE-acetaminophen (PERCOCET) 10-325 MG tablet Take 1 tablet by mouth every 6 (six) hours as needed. 04/02/17  [provider]  sodium chloride (OCEAN) 0.65 % nasal spray Place into the nose.    [provider]     Allergies    Lisinopril, Moxifloxacin, Sulfa antibiotics, and Sulfonamide derivatives   Review of Systems   Review of Systems  Musculoskeletal:  Positive for back pain.  A comprehensive review of systems was completed and negative except as noted in HPI.    Physical Exam BP (!) 128/54 (BP Location: Right Arm)   Pulse 74   Temp 99.4 F (37.4 C) (Oral)   Resp 18   SpO2 97%   Physical Exam Vitals and nursing note reviewed.  HENT:     Head: Normocephalic.     Nose: Nose normal.  Eyes:     Extraocular Movements: Extraocular movements intact.  Pulmonary:     Effort: Pulmonary effort is normal.  Musculoskeletal:        General: Tenderness (R lumbar paraspinal  muscles) present. Normal range of motion.     Cervical back: Neck supple.  Skin:    Findings: No rash (on exposed skin).  Neurological:     Mental Status: She is alert and oriented to person, place, and time.     Sensory: No sensory deficit.     Motor: No weakness.  Psychiatric:        Mood and Affect: Mood normal.     ED Results / Procedures / Treatments   Labs (all labs ordered are listed, but only abnormal results are displayed) Labs Reviewed - No data to display  EKG None  Radiology No results found.  Procedures Procedures  Medications Ordered in the ED Medications  ketorolac (TORADOL) 30 MG/ML injection 30 mg (has no administration in time range)  HYDROmorphone (DILAUDID) injection 1 mg (has no administration in time range)  methocarbamol (ROBAXIN) tablet 500 mg (has no administration in time range)  acetaminophen (TYLENOL) tablet 650 mg (650 mg Oral Given 03/05/21 2004)     MDM Rules/Calculators/A&P MDM Patient here with exacerbation of chronic pain. Plan  IM pain meds, oral muscle relaxer for pain control. She is scheduled to see her pain doctor tomorrow.   ED Course  I have reviewed the triage vital signs and the nursing notes.  Pertinent labs & imaging results that were available during my care of the patient were reviewed by me and considered in my medical decision making (see chart for details).     Final Clinical Impression(s) / ED Diagnoses Final diagnoses:  Exacerbation of chronic back pain    Rx / DC Orders ED Discharge Orders     None        Truddie Hidden, MD 03/05/21 2239

## 2021-03-06 ENCOUNTER — Telehealth: Payer: Self-pay | Admitting: *Deleted

## 2021-03-06 DIAGNOSIS — M48061 Spinal stenosis, lumbar region without neurogenic claudication: Secondary | ICD-10-CM | POA: Diagnosis not present

## 2021-03-06 NOTE — Telephone Encounter (Signed)
Transition Care Management Follow-up Telephone Call Date of discharge and from where: 03/05/2021 - [facility] How have you been since you were released from the hospital? "My back is still hurting a little" Any questions or concerns? No  Items Reviewed: Did the pt receive and understand the discharge instructions provided? Yes  Medications obtained and verified? Yes  Other? No  Any new allergies since your discharge? No  Dietary orders reviewed? No Do you have support at home? Yes   Home Care and Equipment/Supplies: Were home health services ordered? not applicable If so, what is the name of the agency? N/A  Has the agency set up a time to come to the patient's home? not applicable Were any new equipment or medical supplies ordered?  No What is the name of the medical supply agency? N/A Were you able to get the supplies/equipment? not applicable Do you have any questions related to the use of the equipment or supplies? No  Functional Questionnaire: (I = Independent and D = Dependent) ADLs: I  Bathing/Dressing- I  Meal Prep- I  Eating- I  Maintaining continence- I  Transferring/Ambulation- I  Managing Meds- I  Follow up appointments reviewed:  PCP Hospital f/u appt confirmed? No   Specialist Hospital f/u appt confirmed? No   Are transportation arrangements needed? No  If their condition worsens, is the pt aware to call PCP or go to the Emergency Dept.? Yes Was the patient provided with contact information for the PCP's office or ED? Yes Was to pt encouraged to call back with questions or concerns? Yes

## 2021-03-14 DIAGNOSIS — M545 Low back pain, unspecified: Secondary | ICD-10-CM | POA: Diagnosis not present

## 2021-06-17 ENCOUNTER — Ambulatory Visit: Payer: Medicaid Other

## 2021-06-18 DIAGNOSIS — Z03818 Encounter for observation for suspected exposure to other biological agents ruled out: Secondary | ICD-10-CM | POA: Diagnosis not present

## 2021-06-22 ENCOUNTER — Other Ambulatory Visit: Payer: Self-pay | Admitting: Family Medicine

## 2021-06-24 DIAGNOSIS — M545 Low back pain, unspecified: Secondary | ICD-10-CM | POA: Diagnosis not present

## 2021-08-02 DIAGNOSIS — Z20828 Contact with and (suspected) exposure to other viral communicable diseases: Secondary | ICD-10-CM | POA: Diagnosis not present

## 2021-08-20 DIAGNOSIS — Z0389 Encounter for observation for other suspected diseases and conditions ruled out: Secondary | ICD-10-CM | POA: Diagnosis not present

## 2021-08-20 DIAGNOSIS — Z113 Encounter for screening for infections with a predominantly sexual mode of transmission: Secondary | ICD-10-CM | POA: Diagnosis not present

## 2021-08-30 DIAGNOSIS — J310 Chronic rhinitis: Secondary | ICD-10-CM | POA: Diagnosis not present

## 2021-08-30 NOTE — Therapy (Signed)
OUTPATIENT PHYSICAL THERAPY THORACOLUMBAR EVALUATION   Patient Name: Mariah [name] MRN: [id number] DOB:1961/09/10, 60 y.o., female Today's Date: 09/02/2021   PT End of Session - 09/02/21 1147     Visit Number 1    Number of Visits 8    Date for PT Re-Evaluation 10/28/21    Authorization Type [employer]    PT Start Time 1045    PT Stop Time 1130    PT Time Calculation (min) 45 min    Activity Tolerance Patient tolerated treatment well    Behavior During Therapy [facility] for tasks assessed/performed             Past Medical History:  Diagnosis Date   Cramp of muscle of left upper extremity 06/24/2019   Decreased peripheral vision of left eye 08/19/2019   Degenerative disc disease, lumbar    Ear pain 11/21/2019   Elevated hemoglobin A1c 10/26/2019   Elevated hemoglobin A1c 11/23/2019   GERD (gastroesophageal reflux disease)    Hypertension    Insomnia 07/11/2010   Qualifier: Diagnosis of  By: Lelon Perla MD, [facility]     Mitral valve prolapse    Nasal congestion 04/01/2019   Numbness and tingling of right hand 01/29/2017   Pain and swelling of right knee 12/29/2019   Postprandial bloating 04/01/2019   Prediabetes 10/24/2016   Seizures (HCC) 06/23/2017   Shortness of breath 11/21/2019   Past Surgical History:  Procedure Laterality Date   ABDOMINAL HYSTERECTOMY     ABDOMINAL HYSTERECTOMY     BACK SURGERY     CHOLECYSTECTOMY     KNEE SURGERY     Patient Active Problem List   Diagnosis Date Noted   Left shoulder tendinitis 02/25/2021   Fasciculation 02/25/2021   Routine screening for STI (sexually transmitted infection) 01/22/2021   Chronic pain of both shoulders 01/10/2021   Functional dyspepsia 01/10/2021   Colon cancer screening 01/09/2021   Impacted cerumen of right ear 10/26/2020   High priority for COVID-19 virus vaccination 04/11/2020   Atherosclerosis of native arteries of extremity with intermittent claudication (HCC) 11/04/2019   Hypertrophy of inferior nasal  turbinate 07/07/2019   Eczema 02/11/2018   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   Hyperlipidemia 06/26/2017   TIA (transient ischemic attack) 06/23/2017   Health care maintenance 10/24/2016   Callus of foot 04/29/2016   Diabetes mellitus without complication (HCC) 11/02/2015   Status post cervical spinal fusion 04/27/2015   Cervical myelopathy (HCC) 12/22/2014   Amaurosis fugax 08/16/2013   Degenerative disc disease, lumbar    Mitral valve prolapse    OBESITY 02/13/2009   HYPERTENSION, BENIGN 08/07/2008   Anxiety 02/26/2007   Allergic rhinitis 02/26/2007   GERD 02/26/2007    PCP: Reece Leader, DO  REFERRING PROVIDER: Pati Gallo, MD  REFERRING DIAG: Low back pain  THERAPY DIAG:  Chronic bilateral low back pain, unspecified whether sciatica present  Muscle weakness (generalized)  ONSET DATE: pain ongoing worsening for months  SUBJECTIVE:  SUBJECTIVE STATEMENT: Patient reports low back pain and sciatica into buttocks/thighs. She had previous lumbar surgery years ago and has been having the current episode for the past several months that is worsening. Pain is constant, but currently aggravated by walking or she can take a step or turn wrong and the pain will come on. She is able to walk around 10 minutes then pain will go up to 8/10, will have to sit and take a break. Patient states she also has trouble sitting extended periods and has to frequently shift weight. Denies any numbness/tingling, but patient does report having PAD that causes feet/lower legs to be painful.   PERTINENT HISTORY:  Lumbar surgery approximately 2000  PAIN:  Are you having pain? Yes VAS scale: 4/10 (up to 8/10) Pain location: low back pain, radiating into bilateral buttocks and thighs Pain orientation:  Right, Left, Bilateral, and Lower  PAIN TYPE: Chronic Pain description: aching and shooting   Aggravating factors: Standing or walking extended periods, sitting, lying on stomach Relieving factors: Medications, hot bath with Epson salts  PRECAUTIONS: None  WEIGHT BEARING RESTRICTIONS No  FALLS:  Has patient fallen in last 6 months? No  LIVING ENVIRONMENT: Lives with: lives with their family Lives in: House/apartment  OCCUPATION:  N/A  PLOF: Independent  PATIENT GOALS: Pain relief and find movements/things she can do to feel better   OBJECTIVE: DIAGNOSTIC FINDINGS:  N/A  PATIENT SURVEYS:  Modified Oswestry 29/50   SCREENING FOR RED FLAGS: Negative  COGNITION:  Overall cognitive status: Within functional limits for tasks assessed     SENSATION:  Light touch: Appears intact  MUSCLE LENGTH: Hamstrings: Slight limitation bilaterally Thomas test: Slight limitation bilaterally  POSTURE:  Increase lumbar lordosis, bilateral knee valgus  LUMBAR AROM: patient reports pain limiting motion  A/PROM AROM  09/02/2021  Flexion 75%  Extension < 25%  Right lateral flexion 50%  Left lateral flexion 75%  Right rotation 50%  Left rotation 50%   (Blank rows = not tested)  LE PROM:   Hip PROM grossly WFL  LE MMT:  MMT Right 09/02/2021 Left 09/02/2021  Hip flexion 4 4  Hip extension 3- 3-  Hip abduction 3 3  Knee flexion 4 4  Knee extension 4 4   (Blank rows = not tested)  LUMBAR SPECIAL TESTS:  Straight leg raise test: Negative  SPINAL SEGMENTAL MOBILITY ASSESSMENT:  Not assessed  FUNCTIONAL TESTS:  Sit to stand: patient requires UE support to stand, demonstrates bilateral knee valgus, increased lumbar lordosis, poor lumbopelvic control  GAIT: Assistive device utilized: None Level of assistance: Complete Independence Comments: bilateral knee valgus with toe out, trendelenburg bilaterally    TODAY'S TREATMENT  Piriformis stretch 2 x 30 sec each LTR  5 x 10 sec Hooklying hamstring stretch 2 x 30 sec each SLR x 10 each - partial range and cued for abdominal  Posterior pelvic tilt 10 x 5 sec Sidelying clamshell x 15 each   PATIENT EDUCATION:  Education details: Exam findings, POC, HEP Person educated: Patient Education method: Explanation, Demonstration, Tactile cues, Verbal cues, and Handouts Education comprehension: verbalized understanding, returned demonstration, verbal cues required, tactile cues required, and needs further education   HOME EXERCISE PROGRAM: Access Code: [email or web address]   ASSESSMENT: CLINICAL IMPRESSION: Patient is a 60 y.o. female who was seen today for physical therapy evaluation and treatment for chronic low back pain. Objective impairments include decreased activity tolerance, difficulty walking, decreased ROM, decreased strength, impaired flexibility, improper body mechanics, and pain. These impairments  are limiting patient from cleaning, community activity, driving, meal prep, occupation, and laundry. Personal factors including Past/current experiences and Time since onset of injury/illness/exacerbation are also affecting patient's functional outcome. Patient will benefit from skilled PT to address above impairments and improve overall function.  REHAB POTENTIAL: Good  CLINICAL DECISION MAKING: Stable/uncomplicated  EVALUATION COMPLEXITY: Low   GOALS: Goals reviewed with patient? Yes  SHORT TERM GOALS:  STG Name Target Date Goal status  1 Patient will be I with initial HEP in order to progress with therapy. Baseline: HEP provided at eval 09/30/2021 INITIAL  2 Patient will report </= 5/10 pain with walking 10 minutes to improve ability to grocery shop Baseline: 8/10 pain 09/30/2021 INITIAL  3 Patient will perform sit to stand without UE support to indicate imrpoved LE strength and control Baseline: patient requires UE support to stand from chair 09/30/2021 INITIAL   LONG TERM GOALS:   LTG Name Target  Date Goal status  1 Patient will be I with final HEP to maintain progress from PT. Baseline: HEP provided at eval 10/28/2021 INITIAL  2 Patient will demonstrate improved range of motion of lumbar >/= 25% in all directions in order to improve ability to perform household tasks and cleaning  Baseline: 10/28/2021 INITIAL  3 Patient will demonstrate improvement in strength of bilateral knees >/= 4+/5 and hips >/= 4-/5 MMT in order to improve walking tolerance. Baseline: knee strength grossly 4/5 MMT and hip strength grossly </= 3/5 MMT 10/28/2021 INITIAL  4 Patient will report ability to walk >/= 20 minutes with </= 3/10 pain in order to improve community access Baseline: 8/10 pain walking 10 minutes 10/28/2021 INITIAL  5 Patient will report </= 17/50 on modified ODI in order to indicate reduced disability due to back pain Baseline: 29/50 10/28/2021 INITIAL    PLAN: PT FREQUENCY: 1x/week  PT DURATION: 8 weeks  PLANNED INTERVENTIONS: Therapeutic exercises, Therapeutic activity, Neuro Muscular re-education, Balance training, Gait training, Patient/Family education, Joint mobilization, Stair training, Aquatic Therapy, Dry Needling, Electrical stimulation, Spinal mobilization, Cryotherapy, Moist heat, and Manual therapy  PLAN FOR NEXT SESSION: Review HEP and progress PRN, continue stretching for lumbar and hips (add hip flexor/qaud stretch), core and hip strength progression as tolerated   Rosana Hoes, PT, DPT, LAT, ATC 09/02/21  12:01 PM Phone: [phone] Fax: [phone]

## 2021-09-02 ENCOUNTER — Ambulatory Visit: Payer: Medicaid Other | Attending: Sports Medicine | Admitting: Physical Therapy

## 2021-09-02 ENCOUNTER — Encounter: Payer: Self-pay | Admitting: Physical Therapy

## 2021-09-02 ENCOUNTER — Other Ambulatory Visit: Payer: Self-pay

## 2021-09-02 DIAGNOSIS — G8929 Other chronic pain: Secondary | ICD-10-CM | POA: Insufficient documentation

## 2021-09-02 DIAGNOSIS — M6281 Muscle weakness (generalized): Secondary | ICD-10-CM | POA: Diagnosis not present

## 2021-09-02 DIAGNOSIS — M545 Low back pain, unspecified: Secondary | ICD-10-CM | POA: Insufficient documentation

## 2021-09-02 NOTE — Patient Instructions (Signed)
Access Code: [email or web address] URL: [email or web address] Date: 09/02/2021 Prepared by: Rosana Hoes  Exercises Supine Lower Trunk Rotation - 2 x daily - 7 x weekly - 5 reps - 10 seconds hold Supine Piriformis Stretch with Foot on Ground - 2 x daily - 7 x weekly - 3 reps - 30 seconds hold Hooklying Hamstring Stretch with Strap - 2 x daily - 7 x weekly - 3 reps - 30 seconds hold Small Range Straight Leg Raise - 1 x daily - 7 x weekly - 2 sets - 10 reps Supine Posterior Pelvic Tilt - 1 x daily - 7 x weekly - 2 sets - 10 reps - 5 seconds hold Clamshell - 1 x daily - 7 x weekly - 2 sets - 15 reps

## 2021-09-13 ENCOUNTER — Ambulatory Visit: Payer: Medicaid Other | Admitting: Physical Therapy

## 2021-09-13 NOTE — Therapy (Incomplete)
OUTPATIENT PHYSICAL THERAPY TREATMENT NOTE   Patient Name: Mariah [name] MRN: [id number] DOB:09/23/1960, 60 y.o., female Today's Date: 09/13/2021  PCP: Reece Leader, DO REFERRING PROVIDER: Pati Gallo, MD    Past Medical History:  Diagnosis Date   Cramp of muscle of left upper extremity 06/24/2019   Decreased peripheral vision of left eye 08/19/2019   Degenerative disc disease, lumbar    Ear pain 11/21/2019   Elevated hemoglobin A1c 10/26/2019   Elevated hemoglobin A1c 11/23/2019   GERD (gastroesophageal reflux disease)    Hypertension    Insomnia 07/11/2010   Qualifier: Diagnosis of  By: Lelon Perla MD, [facility]     Mitral valve prolapse    Nasal congestion 04/01/2019   Numbness and tingling of right hand 01/29/2017   Pain and swelling of right knee 12/29/2019   Postprandial bloating 04/01/2019   Prediabetes 10/24/2016   Seizures (HCC) 06/23/2017   Shortness of breath 11/21/2019   Past Surgical History:  Procedure Laterality Date   ABDOMINAL HYSTERECTOMY     ABDOMINAL HYSTERECTOMY     BACK SURGERY     CHOLECYSTECTOMY     KNEE SURGERY     Patient Active Problem List   Diagnosis Date Noted   Left shoulder tendinitis 02/25/2021   Fasciculation 02/25/2021   Routine screening for STI (sexually transmitted infection) 01/22/2021   Chronic pain of both shoulders 01/10/2021   Functional dyspepsia 01/10/2021   Colon cancer screening 01/09/2021   Impacted cerumen of right ear 10/26/2020   High priority for COVID-19 virus vaccination 04/11/2020   Atherosclerosis of native arteries of extremity with intermittent claudication (HCC) 11/04/2019   Hypertrophy of inferior nasal turbinate 07/07/2019   Eczema 02/11/2018   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   Hyperlipidemia 06/26/2017   TIA (transient ischemic attack) 06/23/2017   Health care maintenance 10/24/2016   Callus of foot 04/29/2016   Diabetes mellitus without complication (HCC) 11/02/2015   Status post cervical  spinal fusion 04/27/2015   Cervical myelopathy (HCC) 12/22/2014   Amaurosis fugax 08/16/2013   Degenerative disc disease, lumbar    Mitral valve prolapse    OBESITY 02/13/2009   HYPERTENSION, BENIGN 08/07/2008   Anxiety 02/26/2007   Allergic rhinitis 02/26/2007   GERD 02/26/2007    REFERRING PROVIDER: Pati Gallo, MD   REFERRING DIAG: Low back pain  THERAPY DIAG:  No diagnosis found.  ONSET DATE: pain ongoing worsening for months  PERTINENT HISTORY: None PRECAUTIONS: None  SUBJECTIVE: ***  PAIN:  Are you having pain? {yes/no:20286} VAS scale: ***/10 Pain location: *** Pain orientation: {Pain Orientation:25161}  PAIN TYPE: {type:313116} Pain description: {PAIN DESCRIPTION:21022940}  Aggravating factors: *** Relieving factors: ***  Are you having pain? Yes VAS scale: 4/10 (up to 8/10) Pain location: low back pain, radiating into bilateral buttocks and thighs Pain orientation: Right, Left, Bilateral, and Lower  PAIN TYPE: Chronic Pain description: aching and shooting   Aggravating factors: Standing or walking extended periods, sitting, lying on stomach Relieving factors: Medications, hot bath with Epson salts   OBJECTIVE: LUMBAR AROM: patient reports pain limiting motion   A/PROM AROM  09/02/2021  Flexion 75%  Extension < 25%  Right lateral flexion 50%  Left lateral flexion 75%  Right rotation 50%  Left rotation 50%   (Blank rows = not tested)  LE MMT:   MMT Right 09/02/2021 Left 09/02/2021  Hip flexion 4 4  Hip extension 3- 3-  Hip abduction 3 3  Knee flexion 4 4  Knee extension 4 4   (  Blank rows = not tested)      TODAY'S TREATMENT   09/13/2021   09/02/2021 Piriformis stretch 2 x 30 sec each LTR 5 x 10 sec Hooklying hamstring stretch 2 x 30 sec each SLR x 10 each - partial range and cued for abdominal  Posterior pelvic tilt 10 x 5 sec Sidelying clamshell x 15 each     PATIENT EDUCATION:  Education details: HEP *** Person  educated: Patient Education method: Explanation, Demonstration, Tactile cues, Verbal cues, and Handouts Education comprehension: verbalized understanding, returned demonstration, verbal cues required, tactile cues required, and needs further education   HOME EXERCISE PROGRAM: Access Code: [email or web address]     ASSESSMENT: CLINICAL IMPRESSION: Patient tolerated therapy well with no adverse effects. *** Patient would benefit from continued skilled PT to progress mobility and strength in order to reduce pain and maximize functional ability.  Patient is a 60 y.o. female who was seen today for physical therapy evaluation and treatment for chronic low back pain. Objective impairments include decreased activity tolerance, difficulty walking, decreased ROM, decreased strength, impaired flexibility, improper body mechanics, and pain. These impairments are limiting patient from cleaning, community activity, driving, meal prep, occupation, and laundry. Personal factors including Past/current experiences and Time since onset of injury/illness/exacerbation are also affecting patient's functional outcome. Patient will benefit from skilled PT to address above impairments and improve overall function.     GOALS: Goals reviewed with patient? Yes   SHORT TERM GOALS:   STG Name Target Date Goal status  1 Patient will be I with initial HEP in order to progress with therapy. Baseline: HEP provided at eval 09/30/2021 INITIAL  2 Patient will report </= 5/10 pain with walking 10 minutes to improve ability to grocery shop Baseline: 8/10 pain 09/30/2021 INITIAL  3 Patient will perform sit to stand without UE support to indicate imrpoved LE strength and control Baseline: patient requires UE support to stand from chair 09/30/2021 INITIAL    LONG TERM GOALS:    LTG Name Target Date Goal status  1 Patient will be I with final HEP to maintain progress from PT. Baseline: HEP provided at eval 10/28/2021 INITIAL  2 Patient will  demonstrate improved range of motion of lumbar >/= 25% in all directions in order to improve ability to perform household tasks and cleaning  Baseline: 10/28/2021 INITIAL  3 Patient will demonstrate improvement in strength of bilateral knees >/= 4+/5 and hips >/= 4-/5 MMT in order to improve walking tolerance. Baseline: knee strength grossly 4/5 MMT and hip strength grossly </= 3/5 MMT 10/28/2021 INITIAL  4 Patient will report ability to walk >/= 20 minutes with </= 3/10 pain in order to improve community access Baseline: 8/10 pain walking 10 minutes 10/28/2021 INITIAL  5 Patient will report </= 17/50 on modified ODI in order to indicate reduced disability due to back pain Baseline: 29/50 10/28/2021 INITIAL      PLAN: PT FREQUENCY: 1x/week   PT DURATION: 8 weeks   PLANNED INTERVENTIONS: Therapeutic exercises, Therapeutic activity, Neuro Muscular re-education, Balance training, Gait training, Patient/Family education, Joint mobilization, Stair training, Aquatic Therapy, Dry Needling, Electrical stimulation, Spinal mobilization, Cryotherapy, Moist heat, and Manual therapy   PLAN FOR NEXT SESSION: Review HEP and progress PRN, continue stretching for lumbar and hips (add hip flexor/qaud stretch), core and hip strength progression as tolerated    Rosana Hoes, PT, DPT, LAT, ATC 09/13/21  8:11 AM Phone: [phone] Fax: [phone]

## 2021-09-17 ENCOUNTER — Ambulatory Visit: Payer: Medicaid Other | Admitting: Physical Therapy

## 2021-09-17 NOTE — Therapy (Incomplete)
OUTPATIENT PHYSICAL THERAPY TREATMENT NOTE   Patient Name: Mariah [name] MRN: [id number] DOB:10/27/1960, 61 y.o., female Today's Date: 09/17/2021  PCP: Reece Leader, DO REFERRING PROVIDER: Pati Gallo, MD    Past Medical History:  Diagnosis Date   Cramp of muscle of left upper extremity 06/24/2019   Decreased peripheral vision of left eye 08/19/2019   Degenerative disc disease, lumbar    Ear pain 11/21/2019   Elevated hemoglobin A1c 10/26/2019   Elevated hemoglobin A1c 11/23/2019   GERD (gastroesophageal reflux disease)    Hypertension    Insomnia 07/11/2010   Qualifier: Diagnosis of  By: Lelon Perla MD, [facility]     Mitral valve prolapse    Nasal congestion 04/01/2019   Numbness and tingling of right hand 01/29/2017   Pain and swelling of right knee 12/29/2019   Postprandial bloating 04/01/2019   Prediabetes 10/24/2016   Seizures (HCC) 06/23/2017   Shortness of breath 11/21/2019   Past Surgical History:  Procedure Laterality Date   ABDOMINAL HYSTERECTOMY     ABDOMINAL HYSTERECTOMY     BACK SURGERY     CHOLECYSTECTOMY     KNEE SURGERY     Patient Active Problem List   Diagnosis Date Noted   Left shoulder tendinitis 02/25/2021   Fasciculation 02/25/2021   Routine screening for STI (sexually transmitted infection) 01/22/2021   Chronic pain of both shoulders 01/10/2021   Functional dyspepsia 01/10/2021   Colon cancer screening 01/09/2021   Impacted cerumen of right ear 10/26/2020   High priority for COVID-19 virus vaccination 04/11/2020   Atherosclerosis of native arteries of extremity with intermittent claudication (HCC) 11/04/2019   Hypertrophy of inferior nasal turbinate 07/07/2019   Eczema 02/11/2018   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   Hyperlipidemia 06/26/2017   TIA (transient ischemic attack) 06/23/2017   Health care maintenance 10/24/2016   Callus of foot 04/29/2016   Diabetes mellitus without complication (HCC) 11/02/2015   Status post cervical  spinal fusion 04/27/2015   Cervical myelopathy (HCC) 12/22/2014   Amaurosis fugax 08/16/2013   Degenerative disc disease, lumbar    Mitral valve prolapse    OBESITY 02/13/2009   HYPERTENSION, BENIGN 08/07/2008   Anxiety 02/26/2007   Allergic rhinitis 02/26/2007   GERD 02/26/2007    REFERRING PROVIDER: Pati Gallo, MD   REFERRING DIAG: Low back pain  THERAPY DIAG:  No diagnosis found.  ONSET DATE: pain ongoing worsening for months  PERTINENT HISTORY: None PRECAUTIONS: None  SUBJECTIVE: ***  PAIN:  Are you having pain? {yes/no:20286} VAS scale: ***/10 Pain location: *** Pain orientation: {Pain Orientation:25161}  PAIN TYPE: {type:313116} Pain description: {PAIN DESCRIPTION:21022940}  Aggravating factors: *** Relieving factors: ***  Are you having pain? Yes VAS scale: 4/10 (up to 8/10) Pain location: low back pain, radiating into bilateral buttocks and thighs Pain orientation: Right, Left, Bilateral, and Lower  PAIN TYPE: Chronic Pain description: aching and shooting   Aggravating factors: Standing or walking extended periods, sitting, lying on stomach Relieving factors: Medications, hot bath with Epson salts   OBJECTIVE: LUMBAR AROM: patient reports pain limiting motion   A/PROM AROM  09/02/2021  Flexion 75%  Extension < 25%  Right lateral flexion 50%  Left lateral flexion 75%  Right rotation 50%  Left rotation 50%   (Blank rows = not tested)  LE MMT:   MMT Right 09/02/2021 Left 09/02/2021  Hip flexion 4 4  Hip extension 3- 3-  Hip abduction 3 3  Knee flexion 4 4  Knee extension 4 4   (  Blank rows = not tested)      TODAY'S TREATMENT   09/13/2021   09/02/2021 Piriformis stretch 2 x 30 sec each LTR 5 x 10 sec Hooklying hamstring stretch 2 x 30 sec each SLR x 10 each - partial range and cued for abdominal  Posterior pelvic tilt 10 x 5 sec Sidelying clamshell x 15 each     PATIENT EDUCATION:  Education details: HEP *** Person  educated: Patient Education method: Explanation, Demonstration, Tactile cues, Verbal cues, and Handouts Education comprehension: verbalized understanding, returned demonstration, verbal cues required, tactile cues required, and needs further education   HOME EXERCISE PROGRAM: Access Code: [email or web address]     ASSESSMENT: CLINICAL IMPRESSION: Patient tolerated therapy well with no adverse effects. *** Patient would benefit from continued skilled PT to progress mobility and strength in order to reduce pain and maximize functional ability.  Patient is a 61 y.o. female who was seen today for physical therapy evaluation and treatment for chronic low back pain. Objective impairments include decreased activity tolerance, difficulty walking, decreased ROM, decreased strength, impaired flexibility, improper body mechanics, and pain. These impairments are limiting patient from cleaning, community activity, driving, meal prep, occupation, and laundry. Personal factors including Past/current experiences and Time since onset of injury/illness/exacerbation are also affecting patient's functional outcome. Patient will benefit from skilled PT to address above impairments and improve overall function.     GOALS: Goals reviewed with patient? Yes   SHORT TERM GOALS:   STG Name Target Date Goal status  1 Patient will be I with initial HEP in order to progress with therapy. Baseline: HEP provided at eval 09/30/2021 INITIAL  2 Patient will report </= 5/10 pain with walking 10 minutes to improve ability to grocery shop Baseline: 8/10 pain 09/30/2021 INITIAL  3 Patient will perform sit to stand without UE support to indicate imrpoved LE strength and control Baseline: patient requires UE support to stand from chair 09/30/2021 INITIAL    LONG TERM GOALS:    LTG Name Target Date Goal status  1 Patient will be I with final HEP to maintain progress from PT. Baseline: HEP provided at eval 10/28/2021 INITIAL  2 Patient will  demonstrate improved range of motion of lumbar >/= 25% in all directions in order to improve ability to perform household tasks and cleaning  Baseline: 10/28/2021 INITIAL  3 Patient will demonstrate improvement in strength of bilateral knees >/= 4+/5 and hips >/= 4-/5 MMT in order to improve walking tolerance. Baseline: knee strength grossly 4/5 MMT and hip strength grossly </= 3/5 MMT 10/28/2021 INITIAL  4 Patient will report ability to walk >/= 20 minutes with </= 3/10 pain in order to improve community access Baseline: 8/10 pain walking 10 minutes 10/28/2021 INITIAL  5 Patient will report </= 17/50 on modified ODI in order to indicate reduced disability due to back pain Baseline: 29/50 10/28/2021 INITIAL      PLAN: PT FREQUENCY: 1x/week   PT DURATION: 8 weeks   PLANNED INTERVENTIONS: Therapeutic exercises, Therapeutic activity, Neuro Muscular re-education, Balance training, Gait training, Patient/Family education, Joint mobilization, Stair training, Aquatic Therapy, Dry Needling, Electrical stimulation, Spinal mobilization, Cryotherapy, Moist heat, and Manual therapy   PLAN FOR NEXT SESSION: Review HEP and progress PRN, continue stretching for lumbar and hips (add hip flexor/qaud stretch), core and hip strength progression as tolerated    Rosana Hoes, PT, DPT, LAT, ATC 09/17/21  8:14 AM Phone: [phone] Fax: [phone]

## 2021-09-18 DIAGNOSIS — J111 Influenza due to unidentified influenza virus with other respiratory manifestations: Secondary | ICD-10-CM | POA: Diagnosis not present

## 2021-09-24 ENCOUNTER — Ambulatory Visit: Payer: Medicaid Other | Admitting: Physical Therapy

## 2021-10-01 ENCOUNTER — Ambulatory Visit: Payer: Medicaid Other | Admitting: Physical Therapy

## 2021-10-10 ENCOUNTER — Other Ambulatory Visit: Payer: Self-pay

## 2021-10-10 MED ORDER — CLOPIDOGREL BISULFATE 75 MG PO TABS: ORAL_TABLET | ORAL | 3 refills | Status: DC

## 2021-11-01 DIAGNOSIS — J3089 Other allergic rhinitis: Secondary | ICD-10-CM | POA: Diagnosis not present

## 2021-11-01 DIAGNOSIS — H1045 Other chronic allergic conjunctivitis: Secondary | ICD-10-CM | POA: Diagnosis not present

## 2021-11-19 ENCOUNTER — Other Ambulatory Visit: Payer: Self-pay | Admitting: *Deleted

## 2021-11-19 DIAGNOSIS — I10 Essential (primary) hypertension: Secondary | ICD-10-CM

## 2021-11-19 MED ORDER — LOSARTAN POTASSIUM 25 MG PO TABS: ORAL_TABLET | ORAL | 3 refills | Status: DC

## 2021-11-20 DIAGNOSIS — M545 Low back pain, unspecified: Secondary | ICD-10-CM | POA: Diagnosis not present

## 2021-12-12 ENCOUNTER — Ambulatory Visit (INDEPENDENT_AMBULATORY_CARE_PROVIDER_SITE_OTHER): Payer: Medicaid Other | Admitting: Vascular Surgery

## 2021-12-12 ENCOUNTER — Ambulatory Visit (INDEPENDENT_AMBULATORY_CARE_PROVIDER_SITE_OTHER): Payer: Medicaid Other

## 2021-12-12 DIAGNOSIS — I70212 Atherosclerosis of native arteries of extremities with intermittent claudication, left leg: Secondary | ICD-10-CM

## 2021-12-13 ENCOUNTER — Other Ambulatory Visit: Payer: Self-pay

## 2021-12-13 DIAGNOSIS — I10 Essential (primary) hypertension: Secondary | ICD-10-CM

## 2021-12-13 MED ORDER — LOSARTAN POTASSIUM 25 MG PO TABS: ORAL_TABLET | ORAL | 3 refills | Status: DC

## 2021-12-13 NOTE — Telephone Encounter (Signed)
Patient calls nurse line requesting a refill on Losartan.  ? ?#90 was sent in on 3/07, however was set to print.  ? ?Since it has been greater than 2 weeks will forward to PCP for approval.  ? ?Patient has an apt schedule for 4/17. ?

## 2021-12-16 ENCOUNTER — Telehealth: Payer: Self-pay

## 2021-12-16 DIAGNOSIS — I10 Essential (primary) hypertension: Secondary | ICD-10-CM

## 2021-12-16 MED ORDER — LOSARTAN POTASSIUM 25 MG PO TABS
ORAL_TABLET | ORAL | 3 refills | Status: DC
Start: 2021-12-16 — End: 2021-12-30

## 2021-12-16 NOTE — Telephone Encounter (Signed)
Patient LVM on nurse line requesting a refill on Losartan.  ? ?Prescription was sent in on 3/31, however was set to print.  ? ?Will resend.  ?

## 2021-12-18 ENCOUNTER — Encounter (INDEPENDENT_AMBULATORY_CARE_PROVIDER_SITE_OTHER): Payer: Self-pay | Admitting: *Deleted

## 2021-12-30 ENCOUNTER — Encounter: Payer: Self-pay | Admitting: Family Medicine

## 2021-12-30 ENCOUNTER — Ambulatory Visit: Payer: Medicaid Other | Admitting: Family Medicine

## 2021-12-30 VITALS — BP 155/90 | HR 66 | Ht 66.0 in | Wt 194.8 lb

## 2021-12-30 DIAGNOSIS — E782 Mixed hyperlipidemia: Secondary | ICD-10-CM | POA: Diagnosis not present

## 2021-12-30 DIAGNOSIS — E119 Type 2 diabetes mellitus without complications: Secondary | ICD-10-CM

## 2021-12-30 DIAGNOSIS — H579 Unspecified disorder of eye and adnexa: Secondary | ICD-10-CM

## 2021-12-30 DIAGNOSIS — R7303 Prediabetes: Secondary | ICD-10-CM | POA: Diagnosis not present

## 2021-12-30 DIAGNOSIS — I10 Essential (primary) hypertension: Secondary | ICD-10-CM | POA: Diagnosis not present

## 2021-12-30 LAB — POCT GLYCOSYLATED HEMOGLOBIN (HGB A1C): HbA1c, POC (controlled diabetic range): 7 % (ref 0.0–7.0)

## 2021-12-30 MED ORDER — LOSARTAN POTASSIUM 50 MG PO TABS: 50.0000 mg | ORAL_TABLET | Freq: Every day | ORAL | 3 refills | Status: DC

## 2021-12-30 NOTE — Assessment & Plan Note (Signed)
>>  ASSESSMENT AND PLAN FOR HYPERTENSION, BENIGN WRITTEN ON 12/30/2021  7:04 PM BY GANTA, ANUPA, DO  -BP 165/76, on repeat 155/90, not at goal of <140/90 -increased losartan to 50 mg daily and continue HCTZ 25 mg daily, stressed importance of daily compliance  -pending BMP, will notify patient of any abnormal results -instructed to return back to losartan 25 mg dosage if she gets dizzy or lightheaded but I explained to her that given history of stroke, I do not want her to maintain such a high blood pressure -diet and exercise counseling provided -encouraged BP cuff at home but I realize there are financial restraints to this   -follow up in 1 week for BP check, consider repeat BMP

## 2021-12-30 NOTE — Assessment & Plan Note (Signed)
>>  ASSESSMENT AND PLAN FOR PREDIABETES WRITTEN ON 12/30/2021  7:03 PM BY GANTA, ANUPA, DO  -A1c 7, at goal -diet and exercise counseling provided

## 2021-12-30 NOTE — Progress Notes (Signed)
? ? ?  SUBJECTIVE:  ? ?CHIEF COMPLAINT / HPI:  ? ?Patient presents for follow up.  ? ?Hypertension ?Last took BP meds this morning. Denies chest pain, dyspnea or weakness. Takes losartan and HCTZ most days but very rarely she misses a dose due to forgetting. History of prior stroke, taking plavix. She does not check her BP at home as she cannot afford a cuff.  ? ?Prediabetes ?Last A1c 7.1 about 11 months ago. Tries to eat more vegetables. Does not exercise regularly. Made a plan with her friend, to do home workouts. Takes statin daily.  ? ?Has noted recent right eye changes, it comes and goes. She sometimes sees a blue circle or a half moon, when it does come it lasts about a minute. This first started a few days ago, she has experienced this 3 times so far. She has not noticed any specific associated patterns, sometimes she is sitting and other times she is standing. She has not paid much attention to it. She does not see black, a curtain or stars. Last went to the eye doctor more than a year. Denies any head trauma or injury.  ? ?OBJECTIVE:  ? ?BP (!) 155/90   Pulse 66   Ht 5\' 6"  (1.676 m)   Wt 194 lb 12.8 oz (88.4 kg)   SpO2 100%   BMI 31.44 kg/m?   ?General: Patient well-appearing, in no acute distress. ?HEENT: PERRLA, no gross abnormality noted within the orbit, periorbital or cornea  ?CV: RRR, no murmurs or gallops auscultated ?Resp: CTAB, no wheezing, rales or rhonchi noted ?Neuro: CN2-6 grossly intact  ?Psych: mood appropriate, very pleasant  ? ?ASSESSMENT/PLAN:  ? ?HYPERTENSION, BENIGN ?-BP 165/76, on repeat 155/90, not at goal of <140/90 ?-increased losartan to 50 mg daily and continue HCTZ 25 mg daily, stressed importance of daily compliance  ?-pending BMP, will notify patient of any abnormal results ?-instructed to return back to losartan 25 mg dosage if she gets dizzy or lightheaded but I explained to her that given history of stroke, I do not want her to maintain such a high blood pressure ?-diet  and exercise counseling provided ?-encouraged BP cuff at home but I realize there are financial restraints to this   ?-follow up in 1 week for BP check, consider repeat BMP ? ?Prediabetes ?-A1c 7, at goal ?-diet and exercise counseling provided  ? ?Hyperlipidemia ?-obtained lipid panel ?-continue statin therapy ?-diet and exercise counseling  ? ?Eye concern ?-concern for possible development of cataracts ?-ophthalmology referral placed  ? ?-PHQ-9 score of 2 with negative question 9 reviewed.  ? ? , DO ?[facility]  ?

## 2021-12-30 NOTE — Assessment & Plan Note (Addendum)
-  BP 165/76, on repeat 155/90, not at goal of <140/90 ?-increased losartan to 50 mg daily and continue HCTZ 25 mg daily, stressed importance of daily compliance  ?-pending BMP, will notify patient of any abnormal results ?-instructed to return back to losartan 25 mg dosage if she gets dizzy or lightheaded but I explained to her that given history of stroke, I do not want her to maintain such a high blood pressure ?-diet and exercise counseling provided ?-encouraged BP cuff at home but I realize there are financial restraints to this   ?-follow up in 1 week for BP check, consider repeat BMP ?

## 2021-12-30 NOTE — Assessment & Plan Note (Signed)
-  obtained lipid panel ?-continue statin therapy ?-diet and exercise counseling  ?

## 2021-12-30 NOTE — Assessment & Plan Note (Signed)
-  A1c 7, at goal ?-diet and exercise counseling provided  ?

## 2021-12-30 NOTE — Patient Instructions (Signed)
It was great meeting you today! ? ?Today we discussed many things. Please make sure to take all your medications as prescribed. Your A1c is 7! This is great news!  ? ?Please eat plenty of fruits and vegetables, make sure to also stay physically active and exercise at least 90 minutes a week.  ? ?Please see an eye doctor at your earlier convenience, I have placed a referral to the ophthalmologist. If you do not hear from them within 1-2 weeks then please contact us so that we can try to assist with scheduling.  ? ?Your blood pressure is higher than I would like for it to be, I have increased your losartan. Please take losartan 50 mg and hydrochlorothiazide 25 mg daily.  ? ?Please follow up at your next scheduled appointment in 1 week, if anything arises between now and then, please don't hesitate to contact our office. ? ? ?Thank you for allowing [place] to be a part of your medical care! ? ?Thank you, ?Dr. Larae Grooms  ?

## 2021-12-31 ENCOUNTER — Encounter: Payer: Self-pay | Admitting: Family Medicine

## 2021-12-31 LAB — LIPID PANEL
Chol/HDL Ratio: 2.5 ratio (ref 0.0–4.4)
Cholesterol, Total: 172 mg/dL (ref 100–199)
HDL: 70 mg/dL (ref >39)
LDL Chol Calc (NIH): 82 mg/dL (ref 0–99)
Triglycerides: 114 mg/dL (ref 0–149)
VLDL Cholesterol Cal: 20 mg/dL (ref 5–40)

## 2021-12-31 LAB — BASIC METABOLIC PANEL
BUN/Creatinine Ratio: 18 (ref 12–28)
BUN: 13 mg/dL (ref 8–27)
CO2: 23 mmol/L (ref 20–29)
Calcium: 9.8 mg/dL (ref 8.7–10.3)
Chloride: 100 mmol/L (ref 96–106)
Creatinine, Ser: 0.72 mg/dL (ref 0.57–1.00)
Glucose: 97 mg/dL (ref 70–99)
Potassium: 4.2 mmol/L (ref 3.5–5.2)
Sodium: 141 mmol/L (ref 134–144)
eGFR: 96 mL/min/{1.73_m2} (ref >59)

## 2022-01-01 ENCOUNTER — Telehealth: Payer: Self-pay

## 2022-01-01 NOTE — Telephone Encounter (Signed)
Patient calls nurse line reporting the increase of Losartan made her feel dizzy.  ? ?Patient reports she took 50mg  yesterday for the first time and experienced waves of dizziness throughout the day. ? ?Patient reports she feels much better this morning, however is due to take Losartan 50mg  ~10am this morning.  ? ?Patient reports she was told to decrease to 25mg  if she had undesired side effects. Patient plans to take 25mg  today. Patient to call if dizziness continues at lower dose.  ? ?Patient plans to FU on 4/24. ? ?Precautions discussed.  ?

## 2022-01-06 ENCOUNTER — Ambulatory Visit (INDEPENDENT_AMBULATORY_CARE_PROVIDER_SITE_OTHER): Payer: Medicaid Other | Admitting: Family Medicine

## 2022-01-06 ENCOUNTER — Encounter: Payer: Self-pay | Admitting: Family Medicine

## 2022-01-06 DIAGNOSIS — I10 Essential (primary) hypertension: Secondary | ICD-10-CM | POA: Diagnosis not present

## 2022-01-06 NOTE — Patient Instructions (Addendum)
It was great seeing you today! ? ?Today we discussed your blood pressure which looks great. Let's keep the same dose, please take losartan 25 mg as you were originally taking. Please make sure to eat a balanced diet and remain physically active throughout the day.  ? ?Please follow up at your next scheduled appointment in 6 months, if anything arises between now and then, please don't hesitate to contact our office. ? ? ?Thank you for allowing [place] to be a part of your medical care! ? ?Thank you, ?Dr. Robyne Peers  ?

## 2022-01-06 NOTE — Progress Notes (Signed)
? ? ?  SUBJECTIVE:  ? ?CHIEF COMPLAINT / HPI:  ? ?Patient presents for blood pressure follow up. Compliant on antihypertensive regimen of losartan and HCTZ. She was instructed to increase her losartan dose after the last visit when she was consistently hypertensive and instructed to go back down to the original dose if she experienced dizziness or weakness. A few days after she started on the increased dose, she started to feel dizzy so went back to her original dose which she has remained compliant on.  Denies chest pain, dyspnea, leg swelling, vision changes or weakness. Started on her exercise regimen with your friend.  ? ?OBJECTIVE:  ? ?BP 126/68   Pulse 72   Ht 5\' 6"  (1.676 m)   Wt 196 lb 6.4 oz (89.1 kg)   SpO2 99%   BMI 31.70 kg/m?   ?General: Patient well-appearing, in no acute distress. ?CV: RRR, no murmurs or gallops auscultated ?Resp: CTAB, no wheezing, rales or rhonchi noted ?Ext: no LE edema noted bilaterally ?Psych: mood appropriate  ? ?ASSESSMENT/PLAN:  ? ?HYPERTENSION, BENIGN ?-BP 126/68, at goal ?-continue current antihypertensive regimen with HCTZ 25 mg daily and losartan 25 mg daily, encouraged daily compliance and keeping an alarm to ensure she does not miss a dose if needed ?-diet and exercise counseling provided ?-commended patient on lifestyle improvements she has successfully made ?-follow up in 6 months  ?  ? ? ?Donney Dice, DO ?[place]  ?

## 2022-01-06 NOTE — Assessment & Plan Note (Signed)
>>  ASSESSMENT AND PLAN FOR HYPERTENSION, BENIGN WRITTEN ON 01/06/2022  9:25 PM BY GANTA, ANUPA, DO  -BP 126/68, at goal -continue current antihypertensive regimen with HCTZ 25 mg daily and losartan 25 mg daily, encouraged daily compliance and keeping an alarm to ensure she does not miss a dose if needed -diet and exercise counseling provided -commended patient on lifestyle improvements she has successfully made -follow up in 6 months

## 2022-01-06 NOTE — Assessment & Plan Note (Signed)
-  BP 126/68, at goal ?-continue current antihypertensive regimen with HCTZ 25 mg daily and losartan 25 mg daily, encouraged daily compliance and keeping an alarm to ensure she does not miss a dose if needed ?-diet and exercise counseling provided ?-commended patient on lifestyle improvements she has successfully made ?-follow up in 6 months  ?

## 2022-01-11 ENCOUNTER — Emergency Department (HOSPITAL_COMMUNITY)
Admission: EM | Admit: 2022-01-11 | Discharge: 2022-01-11 | Disposition: A | Discharge status: Home / Self Care | Payer: Medicaid Other | Attending: Emergency Medicine | Admitting: Emergency Medicine

## 2022-01-11 ENCOUNTER — Emergency Department (HOSPITAL_COMMUNITY): Payer: Medicaid Other

## 2022-01-11 ENCOUNTER — Encounter (HOSPITAL_COMMUNITY): Payer: Self-pay | Admitting: Emergency Medicine

## 2022-01-11 ENCOUNTER — Other Ambulatory Visit: Payer: Self-pay

## 2022-01-11 DIAGNOSIS — S4991XA Unspecified injury of right shoulder and upper arm, initial encounter: Secondary | ICD-10-CM | POA: Diagnosis present

## 2022-01-11 DIAGNOSIS — S46911A Strain of unspecified muscle, fascia and tendon at shoulder and upper arm level, right arm, initial encounter: Secondary | ICD-10-CM | POA: Insufficient documentation

## 2022-01-11 DIAGNOSIS — W228XXA Striking against or struck by other objects, initial encounter: Secondary | ICD-10-CM | POA: Diagnosis not present

## 2022-01-11 DIAGNOSIS — M25511 Pain in right shoulder: Secondary | ICD-10-CM | POA: Diagnosis not present

## 2022-01-11 DIAGNOSIS — Z7902 Long term (current) use of antithrombotics/antiplatelets: Secondary | ICD-10-CM | POA: Insufficient documentation

## 2022-01-11 MED ORDER — NAPROXEN 375 MG PO TABS: 375.0000 mg | ORAL_TABLET | Freq: Two times a day (BID) | ORAL | 0 refills | Status: DC

## 2022-01-11 MED ORDER — METHOCARBAMOL 500 MG PO TABS: 500.0000 mg | ORAL_TABLET | Freq: Two times a day (BID) | ORAL | 0 refills | Status: DC | PRN

## 2022-01-11 NOTE — Discharge Instructions (Addendum)
Your exam today was reassuring against any serious cause of your right shoulder pain.  X-ray did not show any evidence of fracture or other concerning findings.  Most likely have a muscle strain of that shoulder.  I have sent naproxen which is an anti-inflammatory and a muscle relaxer into the pharmacy for you.  While you are taking naproxen please do not take ibuprofen along with it as they are both anti-inflammatories.  You do not drive after taking the muscle relaxer as it can make you drowsy.  If you have any worsening symptoms please return to the emergency room otherwise I recommend you follow-up with your primary care provider. ?

## 2022-01-11 NOTE — ED Provider Notes (Signed)
?[facility] ?Provider Note ? ? ?CSN: [id number] ?Arrival date & time: 01/11/22  0710 ? ?  ? ?History ? ?Chief Complaint  ?Patient presents with  ? Shoulder Pain  ? ? ?Mariah [name] is a 61 y.o. female. ? ?61 year old female with past medical history of CVA presents today for evaluation of right shoulder pain of 2-day duration.  She denies any known injury but states she has had the usual wear-and-tear on the shoulder including bumping into walls and other stuff.  She has not taken anything prior to arrival.  She states she had rotator cuff surgery about 13 years ago on the same shoulder.  She denies fever, chills, inability to move her shoulder, numbness, or weakness in the right upper extremity.  ? ?The history is provided by the patient. No language interpreter was used.  ? ?  ? ?Home Medications ?Prior to Admission medications   ?Medication Sig Start Date End Date Taking? Authorizing Provider  ?atorvastatin (LIPITOR) 80 MG tablet TAKE 1 TABLET(80 MG) BY MOUTH DAILY 01/07/21   [name], DO  ?Blood Pressure KIT Check BP every other day, keep journal. 10/21/19   [name], DO  ?clopidogrel (PLAVIX) 75 MG tablet Please take 75 mg (1 tablet) daily as prescribed. 10/10/21   Donney Dice, DO  ?diclofenac Sodium (VOLTAREN) 1 % GEL Apply 2 g topically 4 (four) times daily. ?Patient not taking: Reported on 12/30/2021 02/25/21   Milus Banister C, DO  ?fluticasone [facility]) 50 MCG/ACT nasal spray INSTILL 2 SPRAY IN [facility] NOSTRIL EVERY DAY 12/01/18   Myles Gip, DO  ?hydrochlorothiazide (HYDRODIURIL) 25 MG tablet TAKE 1 TABLET(25 MG) BY MOUTH DAILY 01/07/21   [name], DO  ?losartan (COZAAR) 25 MG tablet Take 25 mg by mouth daily.    [provider]  ?omeprazole (PRILOSEC) 40 MG capsule TAKE 1 CAPSULE(40 MG) BY MOUTH DAILY 06/24/21   Ganta, Anupa, DO  ?oxyCODONE-acetaminophen (PERCOCET) 10-325 MG tablet Take 1 tablet by mouth every 6 (six) hours  as needed. 04/02/17   [provider]  ?sodium chloride (OCEAN) 0.65 % nasal spray Place into the nose.    [provider]  ?   ? ?Allergies    ?Lisinopril, Moxifloxacin, Sulfa antibiotics, and Sulfonamide derivatives   ? ?Review of Systems   ?Review of Systems  ?Constitutional:  Negative for chills and fever.  ?Respiratory:  Negative for shortness of breath.   ?Cardiovascular:  Negative for chest pain.  ?Musculoskeletal:  Positive for arthralgias. Negative for joint swelling.  ?Skin:  Negative for wound.  ?Neurological:  Negative for weakness and numbness.  ?All other systems reviewed and are negative. ? ?Physical Exam ?Updated Vital Signs ?BP 118/80   Pulse 88   Temp 98.4 ?F (36.9 ?C)   Resp 20   SpO2 100%  ?Physical Exam ?Vitals and nursing note reviewed.  ?Constitutional:   ?   General: She is not in acute distress. ?   Appearance: Normal appearance. She is not ill-appearing.  ?HENT:  ?   Head: Normocephalic and atraumatic.  ?   Nose: Nose normal.  ?Eyes:  ?   Conjunctiva/sclera: Conjunctivae normal.  ?Cardiovascular:  ?   Rate and Rhythm: Normal rate and regular rhythm.  ?Pulmonary:  ?   Effort: Pulmonary effort is normal. No respiratory distress.  ?Musculoskeletal:     ?   General: No deformity.  ?   Comments: Bilateral shoulders without swelling or deformity.  Full range of  motion present in bilateral shoulders with 5/5 strength in extensor and flexor muscle groups.  Sensation intact.  Passive range of motion without reproduction of pain.  Tenderness to palpation present over the right shoulder joint.  Cervical spine without tenderness to palpation.  Cervical spine with full range of motion.  2+ radial pulse present bilaterally.  ?Skin: ?   Findings: No rash.  ?Neurological:  ?   Mental Status: She is alert.  ? ? ?ED Results / Procedures / Treatments   ?Labs ?(all labs ordered are listed, but only abnormal results are displayed) ?Labs Reviewed - No data to  display ? ?EKG ?None ? ?Radiology ?DG Shoulder Right ? ?Result Date: 01/11/2022 ?CLINICAL DATA:  Right shoulder pain. EXAM: RIGHT SHOULDER - 2+ VIEW COMPARISON:  None. FINDINGS: Three view study. There is no evidence of fracture or dislocation. There is no evidence of arthropathy or other focal bone abnormality. Soft tissues are unremarkable. IMPRESSION: Negative. Electronically Signed   By: Misty Stanley M.D.   On: 01/11/2022 09:32   ? ?Procedures ?Procedures  ? ? ?Medications Ordered in ED ?Medications - No data to display ? ?ED Course/ Medical Decision Making/ A&P ?  ?                        ?Medical Decision Making ?Amount and/or Complexity of Data Reviewed ?Radiology: ordered. ? ? ?61 year old female presents today for evaluation of right shoulder of 2-day duration.  Exam is reassuring.  Right upper extremity is neurovascularly intact.  Strength preserved.  Tenderness palpation present.  Remainder of the right upper extremity without tenderness to palpation.  Patient does have some concern of DVT given her brother passed away with PE.  However patient is at low risk for DVT.  This discussion was had with patient.  She is also low risk on Wells criteria for DVT.  She denies chest pain, shortness of breath to raise any suspicion for PE.  Following discussion patient is agreeable to defer ultrasound in the right upper extremity and follow-up if she has any worsening in symptoms, or develops any signs or symptoms of PE.  Discussed importance of follow-up with her PCP.  Will provide naproxen.  Patient also requesting muscle relaxer.  Will provide Robaxin.  Advised not to drive following taking this medication.  Patient voices understanding and is in agreement with plan.  ? ? ?Final Clinical Impression(s) / ED Diagnoses ?Final diagnoses:  ?Strain of right shoulder, initial encounter  ? ? ?Rx / DC Orders ?ED Discharge Orders   ? ?      Ordered  ?  naproxen (NAPROSYN) 375 MG tablet  2 times daily       ? 01/11/22 1019   ?  methocarbamol (ROBAXIN) 500 MG tablet  2 times daily PRN       ? 01/11/22 1019  ? ?  ?  ? ?  ? ? ?  ?Evlyn Courier, PA-C ?01/11/22 1020 ? ?  ?Jeanell Sparrow, DO ?01/12/22 1857 ? ?

## 2022-01-11 NOTE — ED Triage Notes (Signed)
C/o R shoulder pain x 2 days that is worse with movement.  History of rotator cuff repair.  Denies injury. ?

## 2022-01-13 ENCOUNTER — Telehealth: Payer: Self-pay | Admitting: *Deleted

## 2022-01-13 NOTE — Telephone Encounter (Signed)
Transition Care Management Unsuccessful Follow-up Telephone Call ? ?Date of discharge and from where:  01/11/2022 [facility] ? ?Attempts:  1st Attempt ? ?Reason for unsuccessful TCM follow-up call:  Left voice message ?

## 2022-01-14 NOTE — Telephone Encounter (Signed)
Transition Care Management Follow-up Telephone Call ?Date of discharge and from where: 01/11/2022 [facility] ?How have you been since you were released from the hospital? Patient stated that she is feeling well and did not have any questions or concerns at this time.  ?Any questions or concerns? No ? ?Items Reviewed: ?Did the pt receive and understand the discharge instructions provided? Yes  ?Medications obtained and verified? Yes  ?Other? No  ?Any new allergies since your discharge? No  ?Dietary orders reviewed? No ?Do you have support at home? Yes  ? ?Functional Questionnaire: (I = Independent and D = Dependent) ?ADLs: I ? ?Bathing/Dressing- I ? ?Meal Prep- I ? ?Eating- I ? ?Maintaining continence- I ? ?Transferring/Ambulation- I ? ?Managing Meds- I ? ? ?Follow up appointments reviewed: ? ?PCP Hospital f/u appt confirmed? Yes  Scheduled to see Reece Leader, DO on 01/15/2022 @ 10:50am. ?Specialist Hospital f/u appt confirmed? No   ?Are transportation arrangements needed? No  ?If their condition worsens, is the pt aware to call PCP or go to the Emergency Dept.? Yes ?Was the patient provided with contact information for the PCP's office or ED? Yes ?Was to pt encouraged to call back with questions or concerns? Yes ? ?

## 2022-01-15 ENCOUNTER — Encounter: Payer: Self-pay | Admitting: Family Medicine

## 2022-01-15 ENCOUNTER — Ambulatory Visit (INDEPENDENT_AMBULATORY_CARE_PROVIDER_SITE_OTHER): Payer: Medicaid Other | Admitting: Family Medicine

## 2022-01-15 DIAGNOSIS — R519 Headache, unspecified: Secondary | ICD-10-CM | POA: Diagnosis not present

## 2022-01-15 DIAGNOSIS — I10 Essential (primary) hypertension: Secondary | ICD-10-CM

## 2022-01-15 NOTE — Assessment & Plan Note (Signed)
-  headache seems most consistent with sinus pressure, low concern for tension headache or migraine ?-continue flonase ?-discussed avoiding triggers and supportive care such as use of netti pot and humidifier  ?

## 2022-01-15 NOTE — Assessment & Plan Note (Signed)
-  BP 138/79, at goal of <140/90 ?-continue HCTZ and losartan, no changes today ?-record daily BP and bring to next visit  ?-reassurance provided  ?-ED precautions discussed  ?

## 2022-01-15 NOTE — Patient Instructions (Addendum)
It was great seeing you today! ? ?Today we discussed your headache and blood pressure. Your headache seems to be from sinus headache. Please make sure to avoid triggers such as going outside. Using a humidifier can help along with a netti pot which can clear the congestion. Keep taking flonase.  ? ?Your blood pressure looks great, keep taking your blood pressure medications. I will not make any changes to this today.  ? ?Please follow up at your next scheduled appointment, if anything arises between now and then, please don't hesitate to contact our office. ? ? ?Thank you for allowing [place] to be a part of your medical care! ? ?Thank you, ?Dr. Robyne Peers  ?

## 2022-01-15 NOTE — Assessment & Plan Note (Signed)
>>  ASSESSMENT AND PLAN FOR HYPERTENSION, BENIGN WRITTEN ON 01/15/2022  2:10 PM BY GANTA, ANUPA, DO  -BP 138/79, at goal of <140/90 -continue HCTZ and losartan, no changes today -record daily BP and bring to next visit  -reassurance provided  -ED precautions discussed

## 2022-01-15 NOTE — Progress Notes (Signed)
? ? ?  SUBJECTIVE:  ? ?CHIEF COMPLAINT / HPI:  ? ?Patient presents for ED follow up. She was seen for her right shoulder which is better. When they first checked her BP in the ED it was 118 systolic which is when she was in the most excrutiating pain but then went up to 150s before she left. She is concerned that the BP is fluctuating and when she was in pain is was better and then her BP worsened. Denies chest pain and dyspnea. Also complains of a headache that is sometimes on one side . Started again earlier in the morning, last time she got it was last week. She is unsure of the pattern, she thinks it is weekly to monthly but does not get them every week. Denies any vision changes, photophobia, phonophobia, nausea and vomiting. Aggravating factors when she blows her nose it sometimes gives her relief but worse when she goes outside. It feels like nasal pressure.  ? ?OBJECTIVE:  ? ?BP 138/79   Pulse 67   Ht 5\' 6"  (1.676 m)   Wt 197 lb 3.2 oz (89.4 kg)   SpO2 100%   BMI 31.83 kg/m?   ?General: Patient well-appearing, in no acute distress. ?HEENT: PERRLA, facial tenderness noted along ethmoid and maxillary sinuses  ?CV: RRR, no murmurs or gallops auscultated ?Resp: CTAB, no wheezing, rales or rhonchi noted ?Neuro: CN 2-12 grossly intact, gross sensation intact, 5/5 UE and LE strength, 5/5 grip strength, normal gait  ? ?ASSESSMENT/PLAN:  ? ?HYPERTENSION, BENIGN ?-BP 138/79, at goal of <140/90 ?-continue HCTZ and losartan, no changes today ?-record daily BP and bring to next visit  ?-reassurance provided  ?-ED precautions discussed  ? ?Sinus headache ?-headache seems most consistent with sinus pressure, low concern for tension headache or migraine ?-continue flonase ?-discussed avoiding triggers and supportive care such as use of netti pot and humidifier  ?  ? ? ? , DO ?[facility]  ?

## 2022-02-04 DIAGNOSIS — M545 Low back pain, unspecified: Secondary | ICD-10-CM | POA: Diagnosis not present

## 2022-02-07 ENCOUNTER — Other Ambulatory Visit: Payer: Self-pay

## 2022-02-07 MED ORDER — HYDROCHLOROTHIAZIDE 25 MG PO TABS: ORAL_TABLET | ORAL | 3 refills | Status: DC

## 2022-02-18 ENCOUNTER — Encounter: Payer: Self-pay | Admitting: *Deleted

## 2022-03-21 ENCOUNTER — Ambulatory Visit: Payer: Medicaid Other | Admitting: Family Medicine

## 2022-03-21 VITALS — BP 138/64 | HR 91 | Wt 197.0 lb

## 2022-03-21 DIAGNOSIS — S46811A Strain of other muscles, fascia and tendons at shoulder and upper arm level, right arm, initial encounter: Secondary | ICD-10-CM | POA: Diagnosis not present

## 2022-03-21 DIAGNOSIS — I10 Essential (primary) hypertension: Secondary | ICD-10-CM | POA: Diagnosis not present

## 2022-03-21 DIAGNOSIS — Z1211 Encounter for screening for malignant neoplasm of colon: Secondary | ICD-10-CM | POA: Diagnosis not present

## 2022-03-21 DIAGNOSIS — Z1231 Encounter for screening mammogram for malignant neoplasm of breast: Secondary | ICD-10-CM | POA: Diagnosis not present

## 2022-03-21 DIAGNOSIS — K219 Gastro-esophageal reflux disease without esophagitis: Secondary | ICD-10-CM

## 2022-03-21 NOTE — Assessment & Plan Note (Signed)
-  BP 138/64, at goal -continue current antihypertensive regimen -follow up in 6 months

## 2022-03-21 NOTE — Assessment & Plan Note (Signed)
-  likely secondary to recent physical strain and thus MSK etiology. -no indication to obtain imaging at this time -handout on stretching exercises provided -heating pad and tylenol as appropriate -avoid strenuous activity that exacerbates pain

## 2022-03-21 NOTE — Patient Instructions (Signed)
It was great seeing you today!  Today we discussed your shoulder pain which seems to be due to muscle strain. Please be careful when lifting heavy objects. Using a heating pad and you may take tylenol as needed for the pain. Attached are some stretching exercises that can also help.   I have ordered a mammogram, please schedule this at your earliest convenience.  I have placed a referral to the GI specialist for a colonoscopy. Please contact our office if you have not heard from them within 2 weeks so we can try to assist with scheduling.   Please follow up at your next scheduled appointment in 6 months, if anything arises between now and then, please don't hesitate to contact our office.   Thank you for allowing [place] to be a part of your medical care!  Thank you, Dr. Robyne Peers

## 2022-03-21 NOTE — Progress Notes (Addendum)
    SUBJECTIVE:   CHIEF COMPLAINT / HPI:   Patient presents with right shoulder pain which radiates down to her upper back. Pain started 2 weeks ago, feels like a soreness. Aggravating factors include certain movements. Relieving factors include propping it on pillows and certain stretching exercises. Only change in activity is lifting heavy grocery bags and a big pack of water. Otherwise denies any other trauma. Denies fever, chills, chest pain, nausea, vomiting or rash.   She is also concerned that she has a bump on her abdomen. Still taking her omeprazole which has been helping her. Denies any pain or other associated symptoms.   Patient is interested in getting her colonoscopy and is aware that this is something she needs to do when asked. But she was scared to get it last time since it was during the development of COVID.   OBJECTIVE:   BP 138/64   Pulse 91   Wt 197 lb (89.4 kg)   SpO2 97%   BMI 31.80 kg/m   General: Patient well-appearing, in no acute distress. CV: RRR, no murmurs or gallops auscultated Resp: CTAB, no wheezing, rales or rhonchi noted Abdomen: soft, nontender, nondistended, presence of bowel sounds, no gross abnormalities noted despite deep palpation multiple times MSK: tight thoracic paraspinals and right trapezius muscle, no gross deformity, full active ROM intact, no rash or erythema noted, gross sensation intact, 5/5 UE and drip strength bilaterally   ASSESSMENT/PLAN:   Trapezius muscle strain, right, initial encounter -likely secondary to recent physical strain and thus MSK etiology. -no indication to obtain imaging at this time -handout on stretching exercises provided -heating pad and tylenol as appropriate -avoid strenuous activity that exacerbates pain  GERD -exam unremarkable, no abnormalities noted -possibly due to heartburn or reflux sensation from history of GERD -continue omeprazole 20 mg daily -GERD precautions   HYPERTENSION, BENIGN -BP  138/64, at goal -continue current antihypertensive regimen -follow up in 6 months   Health maintenance -Discussed importance of screening testing for appropriate cancer screening.  -Mammogram ordered, information provided and patient encouraged to schedule at her earliest convenience. -GI referral placed for colonoscopy.    Reece Leader, DO [facility]

## 2022-03-21 NOTE — Assessment & Plan Note (Signed)
-  exam unremarkable, no abnormalities noted -possibly due to heartburn or reflux sensation from history of GERD -continue omeprazole 20 mg daily -GERD precautions

## 2022-03-21 NOTE — Assessment & Plan Note (Signed)
>>  ASSESSMENT AND PLAN FOR HYPERTENSION, BENIGN WRITTEN ON 03/21/2022  2:30 PM BY GANTA, ANUPA, DO  -BP 138/64, at goal -continue current antihypertensive regimen -follow up in 6 months

## 2022-04-01 ENCOUNTER — Ambulatory Visit: Payer: Medicaid Other

## 2022-04-08 ENCOUNTER — Other Ambulatory Visit: Payer: Self-pay | Admitting: Family Medicine

## 2022-04-18 ENCOUNTER — Ambulatory Visit
Admission: RE | Admit: 2022-04-18 | Discharge: 2022-04-18 | Disposition: A | Discharge status: Home / Self Care | Payer: Medicaid Other | Source: Ambulatory Visit | Attending: Family Medicine | Admitting: Family Medicine

## 2022-04-18 DIAGNOSIS — Z1231 Encounter for screening mammogram for malignant neoplasm of breast: Secondary | ICD-10-CM

## 2022-04-19 ENCOUNTER — Encounter: Payer: Self-pay | Admitting: Family Medicine

## 2022-05-21 ENCOUNTER — Telehealth (INDEPENDENT_AMBULATORY_CARE_PROVIDER_SITE_OTHER): Payer: Self-pay | Admitting: Vascular Surgery

## 2022-05-21 NOTE — Telephone Encounter (Signed)
She can come in with ABIs to see me or gs

## 2022-05-21 NOTE — Telephone Encounter (Signed)
Patient called in wanting to know if she needed to come into the office she is having some issues, stating that she is having some really bad spasms when she walks and at night. She is also feeling numbness from the ankle half way to calf. It comes and goes doesn't;t stay that way. She hasn't been doing anything different its just happens ever so often in the left leg, pain has been going in for a few weeks, could be longer she is just given a estimate. It has been longer just getting more frequent last few weeks     Please call and advise

## 2022-05-22 ENCOUNTER — Other Ambulatory Visit (INDEPENDENT_AMBULATORY_CARE_PROVIDER_SITE_OTHER): Payer: Self-pay | Admitting: Nurse Practitioner

## 2022-05-22 DIAGNOSIS — I739 Peripheral vascular disease, unspecified: Secondary | ICD-10-CM

## 2022-05-22 DIAGNOSIS — R200 Anesthesia of skin: Secondary | ICD-10-CM

## 2022-05-23 ENCOUNTER — Encounter (INDEPENDENT_AMBULATORY_CARE_PROVIDER_SITE_OTHER): Payer: Medicaid Other

## 2022-05-27 ENCOUNTER — Ambulatory Visit (INDEPENDENT_AMBULATORY_CARE_PROVIDER_SITE_OTHER): Payer: Medicaid Other | Admitting: Nurse Practitioner

## 2022-05-28 ENCOUNTER — Ambulatory Visit (INDEPENDENT_AMBULATORY_CARE_PROVIDER_SITE_OTHER): Payer: Medicaid Other

## 2022-05-28 DIAGNOSIS — R200 Anesthesia of skin: Secondary | ICD-10-CM

## 2022-05-28 DIAGNOSIS — I739 Peripheral vascular disease, unspecified: Secondary | ICD-10-CM | POA: Diagnosis not present

## 2022-06-02 ENCOUNTER — Ambulatory Visit: Payer: Medicaid Other | Admitting: Family Medicine

## 2022-06-03 ENCOUNTER — Ambulatory Visit (INDEPENDENT_AMBULATORY_CARE_PROVIDER_SITE_OTHER): Payer: Medicaid Other | Admitting: Nurse Practitioner

## 2022-06-03 ENCOUNTER — Encounter (INDEPENDENT_AMBULATORY_CARE_PROVIDER_SITE_OTHER): Payer: Self-pay | Admitting: Nurse Practitioner

## 2022-06-03 VITALS — BP 145/91 | HR 72 | Resp 17 | Ht 66.0 in | Wt 201.8 lb

## 2022-06-03 DIAGNOSIS — E782 Mixed hyperlipidemia: Secondary | ICD-10-CM | POA: Diagnosis not present

## 2022-06-03 DIAGNOSIS — I10 Essential (primary) hypertension: Secondary | ICD-10-CM

## 2022-06-03 DIAGNOSIS — E119 Type 2 diabetes mellitus without complications: Secondary | ICD-10-CM

## 2022-06-03 DIAGNOSIS — I70222 Atherosclerosis of native arteries of extremities with rest pain, left leg: Secondary | ICD-10-CM

## 2022-06-03 NOTE — H&P (View-Only) (Signed)
Subjective:    Patient ID: Mariah [name], female    DOB: 1961-06-13, 61 y.o.   MRN: [id number] No chief complaint on file.   Mariah [name] is a 61 year old female with presents today after having recent pain discomfort and numbness in her left lower extremity.  She notes that she has been having frequent cramping in her left foot in bed at night.  She notes that when she is sitting or resting it typically does not give her any issues.  She also notes some tightness and cramping in her leg consistent with claudication with activity.  Previously the patient has not had any vascular interventions but has had noted mild peripheral arterial disease.  The patient recently underwent noninvasive studies which showed a right ABI of 1.14 and a left to 0.77.  Additional arterial duplex showed an occluded left SFA.  The patient has triphasic tibial artery waveforms in the right lower extremity with strong toe waveforms.  The patient has monophasic left tibial artery waveforms with diminished toe waveforms.    Review of Systems  Neurological:  Positive for numbness.  All other systems reviewed and are negative.      Objective:   Physical Exam Vitals reviewed.  HENT:     Head: Normocephalic.  Cardiovascular:     Rate and Rhythm: Normal rate.     Pulses:          Dorsalis pedis pulses are detected w/ Doppler on the right side and detected w/ Doppler on the left side.       Posterior tibial pulses are detected w/ Doppler on the right side and detected w/ Doppler on the left side.  Pulmonary:     Effort: Pulmonary effort is normal.  Skin:    General: Skin is warm and dry.  Neurological:     Mental Status: She is alert and oriented to person, place, and time.  Psychiatric:        Mood and Affect: Mood normal.        Behavior: Behavior normal.        Thought Content: Thought content normal.        Judgment: Judgment normal.     BP (!) 145/91 (BP Location: Left Arm)   Pulse 72   Resp  17   Ht _0  (1.676 m)   Wt 201 lb 12.8 oz (91.5 kg)   BMI 32.57 kg/m   Past Medical History:  Diagnosis Date   Cramp of muscle of left upper extremity 06/24/2019   Decreased peripheral vision of left eye 08/19/2019   Degenerative disc disease, lumbar    Ear pain 11/21/2019   Elevated hemoglobin A1c 10/26/2019   Elevated hemoglobin A1c 11/23/2019   GERD (gastroesophageal reflux disease)    Hypertension    Insomnia 07/11/2010   Qualifier: Diagnosis of  By: Tye Savoy MD, [facility]     Mitral valve prolapse    Nasal congestion 04/01/2019   Numbness and tingling of right hand 01/29/2017   Pain and swelling of right knee 12/29/2019   Postprandial bloating 04/01/2019   Prediabetes 10/24/2016   Seizures ([place]) 06/23/2017   Shortness of breath 11/21/2019    Social History   Socioeconomic History   Marital status: Married    Spouse name: Not on file   Number of children: Not on file   Years of education: Not on file   Highest education level: Not on file  Occupational History   Not on file  Tobacco Use  Smoking status: Former    Types: Cigarettes    Quit date: 09/16/1995    Years since quitting: 26.7    Passive exposure: Past   Smokeless tobacco: Never  Substance and Sexual Activity   Alcohol use: Yes   Drug use: Yes    Types: Marijuana   Sexual activity: Yes    Birth control/protection: Condom  Other Topics Concern   Not on file  Social History Narrative   Not on file   Social Determinants of Health   Financial Resource Strain: Not on file  Food Insecurity: Not on file  Transportation Needs: Not on file  Physical Activity: Not on file  Stress: Not on file  Social Connections: Not on file  Intimate Partner Violence: Not on file    Past Surgical History:  Procedure Laterality Date   ABDOMINAL HYSTERECTOMY     ABDOMINAL HYSTERECTOMY     BACK SURGERY     CHOLECYSTECTOMY     KNEE SURGERY      Family History  Problem Relation Age of Onset   Lymphoma Mother    Lung  cancer Father    Pulmonary embolism Brother    Colon cancer Neg Hx    Esophageal cancer Neg Hx    Liver cancer Neg Hx    Pancreatic cancer Neg Hx    Rectal cancer Neg Hx    Stomach cancer Neg Hx     Allergies  Allergen Reactions   Lisinopril     REACTION: COUGH   Moxifloxacin     REACTION: N \T\ V   Sulfa Antibiotics     Other reaction(s): Other (See Comments), Other (See Comments) Pt cannot remember rxn Pt cannot remember rxn    Sulfonamide Derivatives Other (See Comments)    Pt cannot remember rxn       Latest Ref Rng & Units 10/11/2018   10:38 AM 06/23/2017   10:05 AM 10/12/2016    2:25 AM  CBC  WBC 3.4 - 10.8 x10E3/uL 7.8  8.5  10.0   Hemoglobin 11.1 - 15.9 g/dL 13.0  14.1  12.7   Hematocrit 34.0 - 46.6 % 40.1  41.7  39.5   Platelets 150 - 450 x10E3/uL 238  260  252       CMP     Component Value Date/Time   NA 141 12/30/2021 1559   K 4.2 12/30/2021 1559   CL 100 12/30/2021 1559   CO2 23 12/30/2021 1559   GLUCOSE 97 12/30/2021 1559   GLUCOSE 100 (H) 06/23/2017 1005   BUN 13 12/30/2021 1559   CREATININE 0.72 12/30/2021 1559   CREATININE 0.80 11/02/2015 1433   CALCIUM 9.8 12/30/2021 1559   PROT 6.6 10/12/2016 0225   ALBUMIN 4.0 10/12/2016 0225   AST 17 10/12/2016 0225   ALT 15 10/12/2016 0225   ALKPHOS 58 10/12/2016 0225   BILITOT 0.4 10/12/2016 0225   GFRNONAA 88 10/21/2019 1625   GFRNONAA 84 11/02/2015 1433   GFRAA 102 10/21/2019 1625   GFRAA >89 11/02/2015 1433     No results found.     Assessment & Plan:   1. Atherosclerosis of native artery of left lower extremity with rest pain (HCC) Recommend:  The patient has evidence of severe atherosclerotic changes of both lower extremities with rest pain that is associated with preulcerative changes and impending tissue loss of the left foot.  This represents a limb threatening ischemia and places the patient at the risk for left limb loss.  Patient should undergo   angiography of the left lower  extremity with the hope for intervention for limb salvage.  The risks and benefits as well as the alternative therapies was discussed in detail with the patient.  All questions were answered.  Patient agrees to proceed with left lower extremity angiography.  The patient will follow up with me in the office after the procedure.       2. HYPERTENSION, BENIGN Continue antihypertensive medications as already ordered, these medications have been reviewed and there are no changes at this time.   3. Diabetes mellitus without complication ([place]) Continue hypoglycemic medications as already ordered, these medications have been reviewed and there are no changes at this time.  Hgb A1C to be monitored as already arranged by primary service   4. Mixed hyperlipidemia Continue statin as ordered and reviewed, no changes at this time    Current Outpatient Medications on File Prior to Visit  Medication Sig Dispense Refill   atorvastatin (LIPITOR) 80 MG tablet TAKE 1 TABLET(80 MG) BY MOUTH DAILY 90 tablet 3   Blood Pressure KIT Check BP every other day, keep journal. 1 kit 0   clopidogrel (PLAVIX) 75 MG tablet Please take 75 mg (1 tablet) daily as prescribed. 90 tablet 3   fluticasone (FLONASE) 50 MCG/ACT nasal spray INSTILL 2 SPRAY IN EACH NOSTRIL EVERY DAY 16 g 11   hydrochlorothiazide (HYDRODIURIL) 25 MG tablet TAKE 1 TABLET(25 MG) BY MOUTH DAILY 90 tablet 3   losartan (COZAAR) 25 MG tablet Take 25 mg by mouth daily.     omeprazole (PRILOSEC) 40 MG capsule TAKE 1 CAPSULE(40 MG) BY MOUTH DAILY 30 capsule 3   sodium chloride (OCEAN) 0.65 % nasal spray Place into the nose.     No current facility-administered medications on file prior to visit.    There are no Patient Instructions on file for this visit. No follow-ups on file.   Kris Hartmann, NP

## 2022-06-03 NOTE — Progress Notes (Addendum)
Subjective:    Patient ID: Mariah [name], female    DOB: 1961-06-13, 61 y.o.   MRN: [id number] No chief complaint on file.   Mariah [name] is a 61 year old female with presents today after having recent pain discomfort and numbness in her left lower extremity.  She notes that she has been having frequent cramping in her left foot in bed at night.  She notes that when she is sitting or resting it typically does not give her any issues.  She also notes some tightness and cramping in her leg consistent with claudication with activity.  Previously the patient has not had any vascular interventions but has had noted mild peripheral arterial disease.  The patient recently underwent noninvasive studies which showed a right ABI of 1.14 and a left to 0.77.  Additional arterial duplex showed an occluded left SFA.  The patient has triphasic tibial artery waveforms in the right lower extremity with strong toe waveforms.  The patient has monophasic left tibial artery waveforms with diminished toe waveforms.    Review of Systems  Neurological:  Positive for numbness.  All other systems reviewed and are negative.      Objective:   Physical Exam Vitals reviewed.  HENT:     Head: Normocephalic.  Cardiovascular:     Rate and Rhythm: Normal rate.     Pulses:          Dorsalis pedis pulses are detected w/ Doppler on the right side and detected w/ Doppler on the left side.       Posterior tibial pulses are detected w/ Doppler on the right side and detected w/ Doppler on the left side.  Pulmonary:     Effort: Pulmonary effort is normal.  Skin:    General: Skin is warm and dry.  Neurological:     Mental Status: She is alert and oriented to person, place, and time.  Psychiatric:        Mood and Affect: Mood normal.        Behavior: Behavior normal.        Thought Content: Thought content normal.        Judgment: Judgment normal.     BP (!) 145/91 (BP Location: Left Arm)   Pulse 72   Resp  17   Ht _0  (1.676 m)   Wt 201 lb 12.8 oz (91.5 kg)   BMI 32.57 kg/m   Past Medical History:  Diagnosis Date   Cramp of muscle of left upper extremity 06/24/2019   Decreased peripheral vision of left eye 08/19/2019   Degenerative disc disease, lumbar    Ear pain 11/21/2019   Elevated hemoglobin A1c 10/26/2019   Elevated hemoglobin A1c 11/23/2019   GERD (gastroesophageal reflux disease)    Hypertension    Insomnia 07/11/2010   Qualifier: Diagnosis of  By: Tye Savoy MD, [facility]     Mitral valve prolapse    Nasal congestion 04/01/2019   Numbness and tingling of right hand 01/29/2017   Pain and swelling of right knee 12/29/2019   Postprandial bloating 04/01/2019   Prediabetes 10/24/2016   Seizures ([place]) 06/23/2017   Shortness of breath 11/21/2019    Social History   Socioeconomic History   Marital status: Married    Spouse name: Not on file   Number of children: Not on file   Years of education: Not on file   Highest education level: Not on file  Occupational History   Not on file  Tobacco Use  Smoking status: Former    Types: Cigarettes    Quit date: 09/16/1995    Years since quitting: 26.7    Passive exposure: Past   Smokeless tobacco: Never  Substance and Sexual Activity   Alcohol use: Yes   Drug use: Yes    Types: Marijuana   Sexual activity: Yes    Birth control/protection: Condom  Other Topics Concern   Not on file  Social History Narrative   Not on file   Social Determinants of Health   Financial Resource Strain: Not on file  Food Insecurity: Not on file  Transportation Needs: Not on file  Physical Activity: Not on file  Stress: Not on file  Social Connections: Not on file  Intimate Partner Violence: Not on file    Past Surgical History:  Procedure Laterality Date   ABDOMINAL HYSTERECTOMY     ABDOMINAL HYSTERECTOMY     BACK SURGERY     CHOLECYSTECTOMY     KNEE SURGERY      Family History  Problem Relation Age of Onset   Lymphoma Mother    Lung  cancer Father    Pulmonary embolism Brother    Colon cancer Neg Hx    Esophageal cancer Neg Hx    Liver cancer Neg Hx    Pancreatic cancer Neg Hx    Rectal cancer Neg Hx    Stomach cancer Neg Hx     Allergies  Allergen Reactions   Lisinopril     REACTION: COUGH   Moxifloxacin     REACTION: N \T\ V   Sulfa Antibiotics     Other reaction(s): Other (See Comments), Other (See Comments) Pt cannot remember rxn Pt cannot remember rxn    Sulfonamide Derivatives Other (See Comments)    Pt cannot remember rxn       Latest Ref Rng & Units 10/11/2018   10:38 AM 06/23/2017   10:05 AM 10/12/2016    2:25 AM  CBC  WBC 3.4 - 10.8 x10E3/uL 7.8  8.5  10.0   Hemoglobin 11.1 - 15.9 g/dL 13.0  14.1  12.7   Hematocrit 34.0 - 46.6 % 40.1  41.7  39.5   Platelets 150 - 450 x10E3/uL 238  260  252       CMP     Component Value Date/Time   NA 141 12/30/2021 1559   K 4.2 12/30/2021 1559   CL 100 12/30/2021 1559   CO2 23 12/30/2021 1559   GLUCOSE 97 12/30/2021 1559   GLUCOSE 100 (H) 06/23/2017 1005   BUN 13 12/30/2021 1559   CREATININE 0.72 12/30/2021 1559   CREATININE 0.80 11/02/2015 1433   CALCIUM 9.8 12/30/2021 1559   PROT 6.6 10/12/2016 0225   ALBUMIN 4.0 10/12/2016 0225   AST 17 10/12/2016 0225   ALT 15 10/12/2016 0225   ALKPHOS 58 10/12/2016 0225   BILITOT 0.4 10/12/2016 0225   GFRNONAA 88 10/21/2019 1625   GFRNONAA 84 11/02/2015 1433   GFRAA 102 10/21/2019 1625   GFRAA >89 11/02/2015 1433     No results found.     Assessment & Plan:   1. Atherosclerosis of native artery of left lower extremity with rest pain (HCC) Recommend:  The patient has evidence of severe atherosclerotic changes of both lower extremities with rest pain that is associated with preulcerative changes and impending tissue loss of the left foot.  This represents a limb threatening ischemia and places the patient at the risk for left limb loss.  Patient should undergo  angiography of the left lower  extremity with the hope for intervention for limb salvage.  The risks and benefits as well as the alternative therapies was discussed in detail with the patient.  All questions were answered.  Patient agrees to proceed with left lower extremity angiography.  The patient will follow up with me in the office after the procedure.       2. HYPERTENSION, BENIGN Continue antihypertensive medications as already ordered, these medications have been reviewed and there are no changes at this time.   3. Diabetes mellitus without complication ([place]) Continue hypoglycemic medications as already ordered, these medications have been reviewed and there are no changes at this time.  Hgb A1C to be monitored as already arranged by primary service   4. Mixed hyperlipidemia Continue statin as ordered and reviewed, no changes at this time    Current Outpatient Medications on File Prior to Visit  Medication Sig Dispense Refill   atorvastatin (LIPITOR) 80 MG tablet TAKE 1 TABLET(80 MG) BY MOUTH DAILY 90 tablet 3   Blood Pressure KIT Check BP every other day, keep journal. 1 kit 0   clopidogrel (PLAVIX) 75 MG tablet Please take 75 mg (1 tablet) daily as prescribed. 90 tablet 3   fluticasone (FLONASE) 50 MCG/ACT nasal spray INSTILL 2 SPRAY IN EACH NOSTRIL EVERY DAY 16 g 11   hydrochlorothiazide (HYDRODIURIL) 25 MG tablet TAKE 1 TABLET(25 MG) BY MOUTH DAILY 90 tablet 3   losartan (COZAAR) 25 MG tablet Take 25 mg by mouth daily.     omeprazole (PRILOSEC) 40 MG capsule TAKE 1 CAPSULE(40 MG) BY MOUTH DAILY 30 capsule 3   sodium chloride (OCEAN) 0.65 % nasal spray Place into the nose.     No current facility-administered medications on file prior to visit.    There are no Patient Instructions on file for this visit. No follow-ups on file.   Kris Hartmann, NP

## 2022-06-04 ENCOUNTER — Telehealth (INDEPENDENT_AMBULATORY_CARE_PROVIDER_SITE_OTHER): Payer: Self-pay

## 2022-06-04 NOTE — Telephone Encounter (Signed)
I attempted to contact the patient to schedule a LLE angio with Dr. Delana Meyer and a message was left for a return call.

## 2022-06-10 ENCOUNTER — Other Ambulatory Visit: Payer: Self-pay

## 2022-06-10 ENCOUNTER — Encounter: Payer: Self-pay | Admitting: Vascular Surgery

## 2022-06-10 ENCOUNTER — Ambulatory Visit
Admission: RE | Admit: 2022-06-10 | Discharge: 2022-06-10 | Disposition: A | Discharge status: Home / Self Care | Payer: Medicaid Other | Attending: Vascular Surgery | Admitting: Vascular Surgery

## 2022-06-10 ENCOUNTER — Encounter
Admission: RE | Disposition: A | Discharge status: Home / Self Care | Payer: Self-pay | Source: Home / Self Care | Attending: Vascular Surgery

## 2022-06-10 DIAGNOSIS — E1151 Type 2 diabetes mellitus with diabetic peripheral angiopathy without gangrene: Secondary | ICD-10-CM | POA: Diagnosis not present

## 2022-06-10 DIAGNOSIS — I70229 Atherosclerosis of native arteries of extremities with rest pain, unspecified extremity: Secondary | ICD-10-CM

## 2022-06-10 DIAGNOSIS — I743 Embolism and thrombosis of arteries of the lower extremities: Secondary | ICD-10-CM | POA: Diagnosis not present

## 2022-06-10 DIAGNOSIS — I70222 Atherosclerosis of native arteries of extremities with rest pain, left leg: Secondary | ICD-10-CM | POA: Diagnosis not present

## 2022-06-10 DIAGNOSIS — E782 Mixed hyperlipidemia: Secondary | ICD-10-CM | POA: Insufficient documentation

## 2022-06-10 DIAGNOSIS — I10 Essential (primary) hypertension: Secondary | ICD-10-CM | POA: Insufficient documentation

## 2022-06-10 HISTORY — DX: Transient cerebral ischemic attack, unspecified: G45.9

## 2022-06-10 HISTORY — PX: LOWER EXTREMITY ANGIOGRAPHY: CATH118251

## 2022-06-10 LAB — BUN: BUN: 11 mg/dL (ref 6–20)

## 2022-06-10 LAB — CREATININE, SERUM
Creatinine, Ser: 0.56 mg/dL (ref 0.44–1.00)
GFR, Estimated: >60 mL/min (ref >60)

## 2022-06-10 SURGERY — LOWER EXTREMITY ANGIOGRAPHY
Anesthesia: Moderate Sedation | Site: Leg Lower | Laterality: Left

## 2022-06-10 MED ORDER — DIPHENHYDRAMINE HCL 50 MG/ML IJ SOLN
INTRAMUSCULAR | Status: AC
  Filled 2022-06-10: qty 1

## 2022-06-10 MED ORDER — HYDROMORPHONE HCL 1 MG/ML IJ SOLN: 1.0000 mg | Freq: Once | INTRAMUSCULAR | Status: DC | PRN

## 2022-06-10 MED ORDER — SODIUM CHLORIDE 0.9 % IV SOLN: INTRAVENOUS | Status: DC

## 2022-06-10 MED ORDER — FENTANYL CITRATE (PF) 100 MCG/2ML IJ SOLN
INTRAMUSCULAR | Status: AC
  Filled 2022-06-10: qty 2

## 2022-06-10 MED ORDER — ONDANSETRON HCL 4 MG/2ML IJ SOLN: 4.0000 mg | Freq: Four times a day (QID) | INTRAMUSCULAR | Status: DC | PRN

## 2022-06-10 MED ORDER — SODIUM CHLORIDE 0.9% FLUSH: 3.0000 mL | Freq: Two times a day (BID) | INTRAVENOUS | Status: DC

## 2022-06-10 MED ORDER — OXYCODONE HCL 5 MG PO TABS: 5.0000 mg | ORAL_TABLET | ORAL | Status: DC | PRN

## 2022-06-10 MED ORDER — METHYLPREDNISOLONE SODIUM SUCC 125 MG IJ SOLR: 125.0000 mg | Freq: Once | INTRAMUSCULAR | Status: DC | PRN

## 2022-06-10 MED ORDER — SODIUM CHLORIDE 0.9 % IV SOLN: 250.0000 mL | INTRAVENOUS | Status: DC | PRN

## 2022-06-10 MED ORDER — ASPIRIN 325 MG PO TBEC
DELAYED_RELEASE_TABLET | ORAL | Status: AC
  Filled 2022-06-10: qty 1

## 2022-06-10 MED ORDER — MIDAZOLAM HCL 2 MG/2ML IJ SOLN
INTRAMUSCULAR | Status: DC | PRN
  Administered 2022-06-10 (×4): 1 mg via INTRAVENOUS
  Administered 2022-06-10: 2 mg via INTRAVENOUS

## 2022-06-10 MED ORDER — SODIUM CHLORIDE 0.9% FLUSH: 3.0000 mL | INTRAVENOUS | Status: DC | PRN

## 2022-06-10 MED ORDER — HYDRALAZINE HCL 20 MG/ML IJ SOLN: 5.0000 mg | INTRAMUSCULAR | Status: DC | PRN

## 2022-06-10 MED ORDER — CEFAZOLIN SODIUM-DEXTROSE 2-4 GM/100ML-% IV SOLN: 2.0000 g | INTRAVENOUS | Status: AC

## 2022-06-10 MED ORDER — FENTANYL CITRATE (PF) 100 MCG/2ML IJ SOLN
INTRAMUSCULAR | Status: DC | PRN
  Administered 2022-06-10: 25 ug via INTRAVENOUS
  Administered 2022-06-10: 50 ug via INTRAVENOUS
  Administered 2022-06-10 (×3): 25 ug via INTRAVENOUS

## 2022-06-10 MED ORDER — MIDAZOLAM HCL 5 MG/5ML IJ SOLN
INTRAMUSCULAR | Status: AC
  Filled 2022-06-10: qty 5

## 2022-06-10 MED ORDER — HEPARIN SODIUM (PORCINE) 1000 UNIT/ML IJ SOLN
INTRAMUSCULAR | Status: AC
  Filled 2022-06-10: qty 10

## 2022-06-10 MED ORDER — DIPHENHYDRAMINE HCL 50 MG/ML IJ SOLN: 50.0000 mg | Freq: Once | INTRAMUSCULAR | Status: DC | PRN

## 2022-06-10 MED ORDER — MIDAZOLAM HCL 2 MG/ML PO SYRP: 8.0000 mg | ORAL_SOLUTION | Freq: Once | ORAL | Status: DC | PRN

## 2022-06-10 MED ORDER — FAMOTIDINE 20 MG PO TABS: 40.0000 mg | ORAL_TABLET | Freq: Once | ORAL | Status: DC | PRN

## 2022-06-10 MED ORDER — CEFAZOLIN SODIUM-DEXTROSE 2-4 GM/100ML-% IV SOLN
INTRAVENOUS | Status: AC
  Administered 2022-06-10: 2 g via INTRAVENOUS
  Filled 2022-06-10: qty 100

## 2022-06-10 MED ORDER — MORPHINE SULFATE (PF) 4 MG/ML IV SOLN: 2.0000 mg | INTRAVENOUS | Status: DC | PRN

## 2022-06-10 MED ORDER — ASPIRIN 81 MG PO TBEC: 81.0000 mg | DELAYED_RELEASE_TABLET | Freq: Every day | ORAL | 2 refills | Status: AC

## 2022-06-10 MED ORDER — DIPHENHYDRAMINE HCL 50 MG/ML IJ SOLN
INTRAMUSCULAR | Status: DC | PRN
  Administered 2022-06-10: 12.5 mg via INTRAVENOUS

## 2022-06-10 MED ORDER — HEPARIN SODIUM (PORCINE) 1000 UNIT/ML IJ SOLN
INTRAMUSCULAR | Status: DC | PRN
  Administered 2022-06-10: 6000 [IU] via INTRAVENOUS

## 2022-06-10 MED ORDER — LABETALOL HCL 5 MG/ML IV SOLN: 10.0000 mg | INTRAVENOUS | Status: DC | PRN

## 2022-06-10 MED ORDER — ACETAMINOPHEN 325 MG PO TABS: 650.0000 mg | ORAL_TABLET | ORAL | Status: DC | PRN

## 2022-06-10 MED ORDER — CEFAZOLIN SODIUM-DEXTROSE 2-4 GM/100ML-% IV SOLN
INTRAVENOUS | Status: AC
  Filled 2022-06-10: qty 100

## 2022-06-10 MED ORDER — ASPIRIN 325 MG PO TABS
325.0000 mg | ORAL_TABLET | Freq: Every day | ORAL | Status: DC
  Administered 2022-06-10: 325 mg via ORAL
  Filled 2022-06-10: qty 1

## 2022-06-10 SURGICAL SUPPLY — 29 items
BALLN LUTONIX 018 4X40X130 (BALLOONS) ×1
BALLN LUTONIX 018 5X150X130 (BALLOONS) ×1
BALLN LUTONIX 018 5X40X130 (BALLOONS) ×1
BALLN LUTONIX 018 5X60X130 (BALLOONS) ×1
BALLN LUTONIX 018 6X60X130 (BALLOONS) ×1
BALLOON LUTONIX 018 4X40X130 (BALLOONS) IMPLANT
BALLOON LUTONIX 018 5X150X130 (BALLOONS) IMPLANT
BALLOON LUTONIX 018 5X40X130 (BALLOONS) IMPLANT
BALLOON LUTONIX 018 5X60X130 (BALLOONS) IMPLANT
BALLOON LUTONIX 018 6X60X130 (BALLOONS) IMPLANT
CATH ANGIO 5F PIGTAIL 65CM (CATHETERS) IMPLANT
CATH BEACON 5 .038 100 VERT TP (CATHETERS) IMPLANT
CATH ROTAREX 135 6FR (CATHETERS) IMPLANT
DEVICE STARCLOSE SE CLOSURE (Vascular Products) IMPLANT
GLIDEWIRE ADV .035X260CM (WIRE) IMPLANT
KIT ENCORE 26 ADVANTAGE (KITS) IMPLANT
NDL ENTRY 21GA 7CM ECHOTIP (NEEDLE) IMPLANT
NEEDLE ENTRY 21GA 7CM ECHOTIP (NEEDLE) ×1 IMPLANT
PACK ANGIOGRAPHY (CUSTOM PROCEDURE TRAY) ×1 IMPLANT
SET INTRO CAPELLA COAXIAL (SET/KITS/TRAYS/PACK) IMPLANT
SHEATH ANL2 6FRX45 HC (SHEATH) IMPLANT
SHEATH BRITE TIP 5FRX11 (SHEATH) IMPLANT
SHEATH PROBE COVER 6X72 (BAG) IMPLANT
STENT LIFESTENT 5F 6X150X135 (Permanent Stent) IMPLANT
STENT LIFESTENT 5F 6X40X135 (Permanent Stent) IMPLANT
STENT LIFESTENT 5F 6X60X135 (Permanent Stent) IMPLANT
TUBING CONTRAST HIGH PRESS 72 (TUBING) IMPLANT
WIRE G V18X300CM (WIRE) IMPLANT
WIRE GUIDERIGHT .035X150 (WIRE) IMPLANT

## 2022-06-10 NOTE — Interval H&P Note (Signed)
History and Physical Interval Note:  06/10/2022 11:53 AM  Mariah [name]  has presented today for surgery, with the diagnosis of LLE Angio   BARD   ASO w rest pain.  The various methods of treatment have been discussed with the patient and family. After consideration of risks, benefits and other options for treatment, the patient has consented to  Procedure(s): Lower Extremity Angiography (Left) as a surgical intervention.  The patient's history has been reviewed, patient examined, no change in status, stable for surgery.  I have reviewed the patient's chart and labs.  Questions were answered to the patient's satisfaction.     Hortencia Pilar

## 2022-06-10 NOTE — Op Note (Signed)
[facility]  Percutaneous Study/Intervention Procedural Note   Date of Surgery: 06/10/2022  Surgeon:  Katha Cabal, MD.  Pre-operative Diagnosis: Atherosclerotic occlusive disease bilateral lower extremities with lifestyle limiting claudication of the left lower extremity in association with rest pain symptoms of the left foot  Post-operative diagnosis:  Same  Procedure(s) Performed:             1.  Introduction catheter into left lower extremity 3rd order catheter placement              2.    Contrast injection left lower extremity for distal runoff             3.  Percutaneous transluminal angioplasty and stent placement left superficial femoral and above-knee popliteal artery in 2 locations.             4.  Mechanical thrombectomy using the Rota Rex catheter left SFA and above-knee popliteal arteries.             5.   Percutaneous transluminal angioplasty of the left profunda femoris to 4 mm             4.  Star close closure right common femoral arteriotomy  Anesthesia: Conscious sedation was administered under my direct supervision by the interventional radiology RN. IV Versed plus fentanyl were utilized. Continuous ECG, pulse oximetry and blood pressure was monitored throughout the entire procedure.  Conscious sedation was for a total of 1 hour 14 minutes 12 seconds.  Sheath: 6 [place] Ansell right common femoral retrograde  Contrast: 95 cc  Fluoroscopy Time: 11.8 minutes  Indications:  JACEY ECKERSON presents with increasing pain with walking of the left lower extremity in association with pain at rest.  Noninvasive studies as well as physical examination demonstrate significant atherosclerotic occlusive disease.  This places the patient at increased risk for limb loss.  Angiography and intervention is recommended.  The risks and benefits are reviewed all questions answered patient agrees to proceed.  Procedure:  JASANI LENGEL is a 61 y.o. y.o.  female who was identified and appropriate procedural time out was performed.  The patient was then placed supine on the table and prepped and draped in the usual sterile fashion.    Ultrasound was placed in the sterile sleeve and the right groin was evaluated the right common femoral artery was echolucent and pulsatile indicating patency.  Image was recorded for the permanent record and under real-time visualization a microneedle was inserted into the common femoral artery microwire followed by a micro-sheath.  A J-wire was then advanced through the micro-sheath and a  5 [place] sheath was then inserted over a J-wire. J-wire was then advanced and a 5 French pigtail catheter was positioned at the level of T12. AP projection of the aorta was then obtained. Pigtail catheter was repositioned to above the bifurcation and a RAO view of the pelvis was obtained.  Subsequently a pigtail catheter with the stiff angle Glidewire was used to cross the aortic bifurcation the catheter wire were advanced down into the left distal external iliac artery. Oblique view of the femoral bifurcation was then obtained and subsequently the wire was reintroduced and the pigtail catheter negotiated into the SFA representing third order catheter placement. Distal runoff was then performed.  6000 units of heparin was then given and allowed to circulate and a 6 [place] Ansell sheath was advanced up and over the bifurcation and positioned in the femoral artery  KMP  catheter and stiff angle Glidewire were then negotiated across the SFA occlusion and into the distal popliteal.  Hand-injection of contrast demonstrates intraluminal positioning.  The [place] Rex catheter is then prepped on the field.  The 0.034 advantage wire is then exchanged for 0.018 V18 wire.  Rota Rex catheter was then used to perform thrombectomy across the SFA proximal above-knee popliteal occlusion.  A total of 3 passes were made.  Follow-up imaging demonstrated that we had  reestablished a lumen but there was greater than 70% stenosis diffusely throughout the distal SFA and proximal popliteal.  Attention was then turned to the femoral bifurcation where a steep LAO projection and a magnified image of the bifurcation was obtained.  This confirmed a greater than 70% ostial stenosis of the SFA.  A 5 mm x 40 mm Lutonix drug-eluting balloon was advanced across this lesion and inflated to 10 atm for 1 full minute.  Follow-up imaging demonstrated greater than 50% residual stenosis and I elected to place a stent right at the origin of the SFA.  A 6 mm x 40 mm life stent was deployed landing it with its leading edge approximately 1 mm proximal to the true origin of the SFA.  This was then postdilated with a 6 mm x 40 mm Lutonix drug-eluting balloon.  Follow-up imaging demonstrated that the stent was slightly angulated toward the profunda femoris but it was right at the origin of the SFA.  I decided to touchup the origin of the profunda femoris later in the case.  I turned my attention to the distal SFA proximal popliteal lesion.  A 6 mm x 150 mm life stent was then deployed across the lesion it was then postdilated with a 5 mm x 150 mm Lutonix drug-eluting balloon.  The inflation was to 8 atm for 1 full minute.  Follow-up imaging demonstrated greater than 50% residual stenosis for 3 to 4 cm distal to the stent extending into the popliteal.  A 6 mm x 60 mm life stent was then deployed across this lesion and a 5 mm x 60 mm Lutonix drug-eluting balloon used to post dilate the stent to 10 atm for 1 full minute.  Follow-up imaging demonstrated less than 10% residual stenosis throughout the entire length of the SFA as well as the popliteal.  I then turned my attention back to the femoral bifurcation where using a Kumpe catheter the V18 wire was negotiated into the profunda femoris.  A 4 mm x 40 mm Lutonix drug-eluting balloon was delivered onto the field and crossed across the origin of the profunda  femoris it was inflated to 10 atm for 1 minute.  Follow-up imaging now demonstrates the profunda femoris origin is widely patent there is less than 15% impingement from the SFA stent on the profunda femoris origin.    Prior to treating the profunda femoris distal runoff was obtained and was noted to be widely patent and unchanged from preop.  After review of these images the sheath is pulled into the right external iliac oblique of the common femoral is obtained and a Star close device deployed. There no immediate complications.   Findings:  The abdominal aorta is opacified with a bolus injection contrast. Renal arteries are single and widely patent. The aorta itself has diffuse disease but no hemodynamically significant lesions. The common and external iliac arteries are widely patent bilaterally.  The left common femoral is widely patent as is the profunda femoris.  The SFA has a 70% stenosis at  its origin.  In the mid SFA extending across Hunter's canal there is an occlusion.  The mid popliteal demonstrates diffuse atherosclerotic changes but no hemodynamically significant stenoses which continues through the distal popliteal.  The trifurcation is patent however the anterior tibial is the dominant runoff and widely patent down to the foot measuring 2-1/2 mm.  Tibioperoneal trunk is widely patent as is the peroneal.  The posterior tibial is threadlike from its origin extending distally until it appears to occlude near the ankle.  The anterior tibial does fill the dorsalis pedis quite rapidly as well as the pedal arch.    Following angioplasty angioplasty and stent placement within the origin of the SFA the SFA at its origin is widely patent there is moderate impingement into the origin of the profunda femoris which was easily treated later in the case with a 4 mm balloon dilatation.  At the conclusion the femoral bifurcation is widely patent with less than 10% residual stenosis in the SFA and less than  15% impingement at the origin of the profunda femoris.  Distal profunda femoris is maintained without change.  The distal SFA proximal popliteal is successfully treated with the [place] thrombectomy was performed reestablishing a lumen.  Following stent placement then postdilatation with 5 mm Lutonix balloons there is wide patency of the SFA and popliteal with preservation of the two-vessel runoff.  Summary: Successful recanalization left lower extremity for limb salvage                        Disposition: Patient was taken to the recovery room in stable condition having tolerated the procedure well.  Tiffiny Worthy, Dolores Lory 06/10/2022,1:50 PM

## 2022-06-11 ENCOUNTER — Encounter: Payer: Self-pay | Admitting: Vascular Surgery

## 2022-06-11 ENCOUNTER — Telehealth (INDEPENDENT_AMBULATORY_CARE_PROVIDER_SITE_OTHER): Payer: Self-pay

## 2022-06-11 NOTE — Telephone Encounter (Signed)
Spoke with the patient and she states her left foot is warm, and she is having headaches and they come and go. Patient has no other symptoms as of yet. Patient had a left leg angio with Dr. Delana Meyer on 05/31/22. Please advise.

## 2022-06-11 NOTE — Telephone Encounter (Signed)
I spoke with patient directly and answered all questions.  I advised her to take tylenol for headaches.  Her foot should be warm given recent intervention

## 2022-06-16 ENCOUNTER — Telehealth (INDEPENDENT_AMBULATORY_CARE_PROVIDER_SITE_OTHER): Payer: Self-pay | Admitting: Nurse Practitioner

## 2022-06-16 ENCOUNTER — Other Ambulatory Visit (INDEPENDENT_AMBULATORY_CARE_PROVIDER_SITE_OTHER): Payer: Self-pay | Admitting: Nurse Practitioner

## 2022-06-16 ENCOUNTER — Telehealth: Payer: Self-pay

## 2022-06-16 ENCOUNTER — Telehealth (INDEPENDENT_AMBULATORY_CARE_PROVIDER_SITE_OTHER): Payer: Self-pay

## 2022-06-16 MED ORDER — TRAMADOL HCL 50 MG PO TABS: 50.0000 mg | ORAL_TABLET | Freq: Four times a day (QID) | ORAL | 0 refills | Status: DC | PRN

## 2022-06-16 NOTE — Telephone Encounter (Signed)
sent 

## 2022-06-16 NOTE — Telephone Encounter (Signed)
Pt wants to know when she will be able to get back to daily activities such as driving?  Also pt needs pain meds.  Please advise

## 2022-06-16 NOTE — Telephone Encounter (Signed)
Patient called in stating that she is needing pain medication asking if it can be sent to pharmacy. Patient had a procedure  on 06/10/2022    [id number] - [place], [place] - [address]   Please call and advise

## 2022-06-16 NOTE — Telephone Encounter (Signed)
Tramadol sent, she can start driving as soon as she feels able

## 2022-06-16 NOTE — Telephone Encounter (Signed)
Patient calls nurse line to check status of provider completing paperwork for [facility]. Patient reports that she needs to have a dental procedure and dental office has been faxing a form to our office for a medication.   Called dental office to get more information. They did not answer, LVM asking for returned phone call.   Talbot Grumbling, RN

## 2022-06-16 NOTE — Telephone Encounter (Signed)
Pt made aware and states understanding. 

## 2022-07-03 ENCOUNTER — Encounter: Payer: Self-pay | Admitting: Gastroenterology

## 2022-07-03 ENCOUNTER — Other Ambulatory Visit: Payer: Self-pay

## 2022-07-03 ENCOUNTER — Ambulatory Visit: Payer: Medicaid Other | Admitting: Gastroenterology

## 2022-07-03 MED ORDER — ATORVASTATIN CALCIUM 80 MG PO TABS: ORAL_TABLET | ORAL | 3 refills | Status: DC

## 2022-07-03 NOTE — Telephone Encounter (Signed)
Called patient back and informed that refill had been sent to pharmacy.   Patient will also reach out to dental office regarding paperwork.   Talbot Grumbling, RN

## 2022-07-03 NOTE — Telephone Encounter (Signed)
Patient returns call to nurse line regarding form for [facility]. She is very upset as this has not been completed.   Advised patient that I contacted dental office, however, never received response.   I called them again this morning, they did not answer. Left another VM asking that they fax over this form to our office.   She also states that she has been requesting rx refill on atorvastatin refill for weeks. Advised patient that I have not received refill request. Patient states that "this is a very important medication and that we need to get on the ball."   Refill sent to provider in separate encounter.   Talbot Grumbling, RN

## 2022-07-03 NOTE — Telephone Encounter (Signed)
Received fax from [employer]. Form placed in provider box for completion.   Talbot Grumbling, RN

## 2022-07-04 ENCOUNTER — Other Ambulatory Visit (INDEPENDENT_AMBULATORY_CARE_PROVIDER_SITE_OTHER): Payer: Self-pay | Admitting: Vascular Surgery

## 2022-07-04 DIAGNOSIS — I739 Peripheral vascular disease, unspecified: Secondary | ICD-10-CM

## 2022-07-07 ENCOUNTER — Ambulatory Visit (INDEPENDENT_AMBULATORY_CARE_PROVIDER_SITE_OTHER): Payer: Medicaid Other

## 2022-07-07 ENCOUNTER — Ambulatory Visit (INDEPENDENT_AMBULATORY_CARE_PROVIDER_SITE_OTHER): Payer: Medicaid Other | Admitting: Nurse Practitioner

## 2022-07-07 ENCOUNTER — Encounter (INDEPENDENT_AMBULATORY_CARE_PROVIDER_SITE_OTHER): Payer: Self-pay | Admitting: Nurse Practitioner

## 2022-07-07 VITALS — BP 155/97 | HR 87 | Resp 16 | Ht 66.0 in | Wt 203.0 lb

## 2022-07-07 DIAGNOSIS — I10 Essential (primary) hypertension: Secondary | ICD-10-CM

## 2022-07-07 DIAGNOSIS — E782 Mixed hyperlipidemia: Secondary | ICD-10-CM | POA: Diagnosis not present

## 2022-07-07 DIAGNOSIS — I70222 Atherosclerosis of native arteries of extremities with rest pain, left leg: Secondary | ICD-10-CM | POA: Diagnosis not present

## 2022-07-07 DIAGNOSIS — I739 Peripheral vascular disease, unspecified: Secondary | ICD-10-CM

## 2022-07-07 DIAGNOSIS — Z9889 Other specified postprocedural states: Secondary | ICD-10-CM | POA: Diagnosis not present

## 2022-07-07 NOTE — Progress Notes (Signed)
Subjective:    Patient ID: Mariah [name], female    DOB: June 27, 1961, 61 y.o.   MRN: [id number] Chief Complaint  Patient presents with   Follow-up    ultrasound    The patient returns to the office for followup and review status post angiogram with intervention on 06/10/2022.   Procedure:   1.  Introduction catheter into left lower extremity 3rd order catheter placement              2.    Contrast injection left lower extremity for distal runoff             3.  Percutaneous transluminal angioplasty and stent placement left superficial femoral and above-knee popliteal artery in 2 locations.             4.  Mechanical thrombectomy using the Rota Rex catheter left SFA and above-knee popliteal arteries.             5.   Percutaneous transluminal angioplasty of the left profunda femoris to 4 mm             4.  Star close closure right common femoral arteriotomy  The patient notes improvement in the lower extremity symptoms. No interval shortening of the patient's claudication distance or rest pain symptoms. No new ulcers or wounds have occurred since the last visit.  There have been no significant changes to the patient's overall health care.  No documented history of amaurosis fugax or recent TIA symptoms. There are no recent neurological changes noted. No documented history of DVT, PE or superficial thrombophlebitis. The patient denies recent episodes of angina or shortness of breath.   ABI's Rt=1.09 and Lt=1.16  (previous ABI's Rt=1.14 and Lt=.77) Duplex US of the Left lower extremity arterial system shows Triphasic waveforms and normal wave forms bilaterally.       Review of Systems  Cardiovascular:  Positive for leg swelling.  All other systems reviewed and are negative.      Objective:   Physical Exam Constitutional:      Appearance: Normal appearance. She is normal weight.  HENT:     Head: Normocephalic.  Cardiovascular:     Rate and Rhythm: Normal rate.      Pulses:          Popliteal pulses are 2+ on the right side and 2+ on the left side.       Dorsalis pedis pulses are detected w/ Doppler on the right side and detected w/ Doppler on the left side.       Posterior tibial pulses are detected w/ Doppler on the right side and detected w/ Doppler on the left side.  Pulmonary:     Effort: Pulmonary effort is normal.  Musculoskeletal:     Left lower leg: 1+ Edema present.  Skin:    General: Skin is warm and dry.  Neurological:     Mental Status: She is alert and oriented to person, place, and time.  Psychiatric:        Mood and Affect: Mood normal.        Behavior: Behavior normal.        Thought Content: Thought content normal.        Judgment: Judgment normal.     BP (!) 155/97 (BP Location: Left Arm)   Pulse 87   Resp 16   Ht 5\' 6"  (1.676 m)   Wt 203 lb (92.1 kg)   BMI 32.77 kg/m   Past Medical History:  Diagnosis Date   Cramp of muscle of left upper extremity 06/24/2019   Decreased peripheral vision of left eye 08/19/2019   Degenerative disc disease, lumbar    Ear pain 11/21/2019   Elevated hemoglobin A1c 10/26/2019   Elevated hemoglobin A1c 11/23/2019   GERD (gastroesophageal reflux disease)    Hypertension    Insomnia 07/11/2010   Qualifier: Diagnosis of  By: Tye Savoy MD, [facility]     Mitral valve prolapse    Nasal congestion 04/01/2019   Numbness and tingling of right hand 01/29/2017   Pain and swelling of right knee 12/29/2019   Postprandial bloating 04/01/2019   Prediabetes 10/24/2016   Shortness of breath 11/21/2019   TIA (transient ischemic attack)     Social History   Socioeconomic History   Marital status: Married    Spouse name: Not on file   Number of children: Not on file   Years of education: Not on file   Highest education level: Not on file  Occupational History   Not on file  Tobacco Use   Smoking status: Former    Types: Cigarettes    Quit date: 09/16/1995    Years since quitting: 26.8     Passive exposure: Past   Smokeless tobacco: Never  Substance and Sexual Activity   Alcohol use: Yes   Drug use: Yes    Types: Marijuana   Sexual activity: Yes    Birth control/protection: Condom  Other Topics Concern   Not on file  Social History Narrative   Not on file   Social Determinants of Health   Financial Resource Strain: Not on file  Food Insecurity: Not on file  Transportation Needs: Not on file  Physical Activity: Not on file  Stress: Not on file  Social Connections: Not on file  Intimate Partner Violence: Not on file    Past Surgical History:  Procedure Laterality Date   ABDOMINAL HYSTERECTOMY     ABDOMINAL HYSTERECTOMY     [place] Left 06/10/2022   Procedure: Lower Extremity Angiography;  Surgeon: Katha Cabal, MD;  Location: [facility];  Service: Cardiovascular;  Laterality: Left;    Family History  Problem Relation Age of Onset   Lymphoma Mother    Lung cancer Father    Pulmonary embolism Brother    Colon cancer Neg Hx    Esophageal cancer Neg Hx    Liver cancer Neg Hx    Pancreatic cancer Neg Hx    Rectal cancer Neg Hx    Stomach cancer Neg Hx     Allergies  Allergen Reactions   Lisinopril     REACTION: COUGH   Moxifloxacin     REACTION: N$RemoveBef' \\T'eKEKVBypIL$ \ V   Sulfa Antibiotics     Other reaction(s): Other (See Comments), Other (See Comments) Pt cannot remember rxn Pt cannot remember rxn    Sulfonamide Derivatives Other (See Comments)    Pt cannot remember rxn       Latest Ref Rng & Units 10/11/2018   10:38 AM 06/23/2017   10:05 AM 10/12/2016    2:25 AM  CBC  WBC 3.4 - 10.8 x10E3/uL 7.8  8.5  10.0   Hemoglobin 11.1 - 15.9 g/dL 13.0  14.1  12.7   Hematocrit 34.0 - 46.6 % 40.1  41.7  39.5   Platelets 150 - 450 x10E3/uL 238  260  252       CMP     Component Value Date/Time   NA 141 12/30/2021 1559   K 4.2 12/30/2021 1559   CL  100 12/30/2021 1559   CO2 23 12/30/2021 1559   GLUCOSE 97 12/30/2021 1559   GLUCOSE 100 (H) 06/23/2017 1005   BUN 11 06/10/2022 1126   BUN 13 12/30/2021 1559   CREATININE 0.56 06/10/2022 1126   CREATININE 0.80 11/02/2015 1433   CALCIUM 9.8 12/30/2021 1559   PROT 6.6 10/12/2016 0225   ALBUMIN 4.0 10/12/2016 0225   AST 17 10/12/2016 0225   ALT 15 10/12/2016 0225   ALKPHOS 58 10/12/2016 0225   BILITOT 0.4 10/12/2016 0225   GFRNONAA >60 06/10/2022 1126   GFRNONAA 84 11/02/2015 1433   GFRAA 102 10/21/2019 1625   GFRAA >89 11/02/2015 1433     VAS [place] ABI WITH/WO TBI  Result Date: 06/09/2022  LOWER EXTREMITY DOPPLER STUDY Patient Name:  SAWYER KAHAN  Date of Exam:   05/28/2022 Medical Rec #: [id number]          Accession #:    [id number] Date of Birth: 1961/08/22          Patient Gender: F Patient Age:   70 years Exam Location:  [facility] Procedure:      VAS [place] ABI WITH/WO TBI Referring Phys: Eulogio Ditch --------------------------------------------------------------------------------  Indications: Peripheral artery disease. High Risk         Hypertension, hyperlipidemia, Diabetes, past history of Factors:          smoking. Other Factors: Numbness left foot.  Performing Technologist: Delorise Shiner RVT  Examination Guidelines: A complete evaluation includes at minimum, Doppler waveform signals and systolic blood pressure reading at the level of bilateral brachial, anterior tibial, and posterior tibial arteries, when vessel segments are accessible. Bilateral testing is considered an integral part of a complete examination. Photoelectric Plethysmograph (PPG) waveforms and toe systolic pressure readings are included as required and additional duplex testing as needed. Limited examinations for reoccurring indications may be performed as noted.  ABI Findings: +---------+------------------+-----+---------+--------+ Right    Rt Pressure (mmHg)IndexWaveform Comment   +---------+------------------+-----+---------+--------+ Brachial 148                                      +---------+------------------+-----+---------+--------+ ATA      162               1.08 triphasic         +---------+------------------+-----+---------+--------+ PTA      171               1.14 triphasic         +---------+------------------+-----+---------+--------+ Great Toe105               0.70                   +---------+------------------+-----+---------+--------+ +---------+------------------+-----+----------+-------+ Left     Lt Pressure (mmHg)IndexWaveform  Comment +---------+------------------+-----+----------+-------+ Brachial 150                                      +---------+------------------+-----+----------+-------+ ATA      115               0.77 monophasic        +---------+------------------+-----+----------+-------+ PTA      97  0.65 monophasic        +---------+------------------+-----+----------+-------+ Great Toe88                0.59                   +---------+------------------+-----+----------+-------+ +-------+-----------+-----------+------------+------------+ ABI/TBIToday's ABIToday's TBIPrevious ABIPrevious TBI +-------+-----------+-----------+------------+------------+ Right  1.14       0.70       1.12        0.83         +-------+-----------+-----------+------------+------------+ Left   0.77       0.59       0.90        0.56         +-------+-----------+-----------+------------+------------+  Limited imaging showed left SFA occlusion. Right ABIs appear essentially unchanged compared to prior study on 12/12/2021. Left ABIs appear decreased compared to prior study on 12/12/2021.  Summary: Right: Resting right ankle-brachial index is within normal range. The right toe-brachial index is normal. Left: Resting left ankle-brachial index indicates moderate left lower extremity arterial disease. The  left toe-brachial index is abnormal. *See table(s) above for measurements and observations.  Electronically signed by Levora Dredge MD on 06/09/2022 at 5:23:42 PM.    Final        Assessment & Plan:   1. Atherosclerosis of native artery of left lower extremity with rest pain [facility]) Recommend:  The patient is status post successful angiogram with intervention.  The patient reports that the claudication symptoms and leg pain has improved.   The patient denies lifestyle limiting changes at this point in time.  No further invasive studies, angiography or surgery at this time The patient should continue walking and begin a more formal exercise program.  The patient should continue antiplatelet therapy and aggressive treatment of the lipid abnormalities  Continued surveillance is indicated as atherosclerosis is likely to progress with time.    Patient should undergo noninvasive studies as ordered. The patient will follow up with me to review the studies.    Also Patient questioned about upcoming colonoscopy scheduled for next month. Due to the fact she will need to be on plavix and asa for a minimum of 3 months I asked she reach out to the GI service doing the colonoscopy and ask to reschedule to after the discontinuation of her plavix and asa. - VAS [place] ABI WITH/WO TBI; Future - VAS [place] LOWER EXTREMITY ARTERIAL DUPLEX; Future  2. HYPERTENSION, BENIGN Continue antihypertensive medications as already ordered, these medications have been reviewed and there are no changes at this time.   3. Mixed hyperlipidemia Continue statin as ordered and reviewed, no changes at this time    Current Outpatient Medications on File Prior to Visit  Medication Sig Dispense Refill   aspirin EC 81 MG tablet Take 1 tablet (81 mg total) by mouth daily. Swallow whole. 150 tablet 2   atorvastatin (LIPITOR) 80 MG tablet TAKE 1 TABLET(80 MG) BY MOUTH DAILY 90 tablet 3   azelastine (ASTELIN) 0.1 % nasal spray 1 puff in  each nostril Nasally Twice a day     Blood Pressure KIT Check BP every other day, keep journal. 1 kit 0   clopidogrel (PLAVIX) 75 MG tablet Please take 75 mg (1 tablet) daily as prescribed. 90 tablet 3   fluticasone (FLONASE) 50 MCG/ACT nasal spray INSTILL 2 SPRAY IN EACH NOSTRIL EVERY DAY 16 g 11   hydrochlorothiazide (HYDRODIURIL) 25 MG tablet TAKE 1 TABLET(25 MG) BY MOUTH DAILY 90 tablet 3  losartan (COZAAR) 25 MG tablet Take 25 mg by mouth daily.     omeprazole (PRILOSEC) 40 MG capsule TAKE 1 CAPSULE(40 MG) BY MOUTH DAILY 30 capsule 3   sodium chloride (OCEAN) 0.65 % nasal spray Place into the nose.     traMADol (ULTRAM) 50 MG tablet Take 1 tablet (50 mg total) by mouth every 6 (six) hours as needed. 20 tablet 0   UNABLE TO FIND Place 2 sprays into the nose as needed. Acetylcystine Nasal InhalerMed     No current facility-administered medications on file prior to visit.    There are no Patient Instructions on file for this visit. No follow-ups on file.   Kris Hartmann, NP

## 2022-07-14 ENCOUNTER — Encounter (INDEPENDENT_AMBULATORY_CARE_PROVIDER_SITE_OTHER): Payer: Self-pay

## 2022-07-24 ENCOUNTER — Telehealth (INDEPENDENT_AMBULATORY_CARE_PROVIDER_SITE_OTHER): Payer: Self-pay | Admitting: Vascular Surgery

## 2022-07-24 NOTE — Telephone Encounter (Signed)
Patient called in stating that she is having swelling maybe every 2-3 days around ankle and calf. When she does ADL - legs swelling. Also having stabbing in foot - left leg. Also wants to know if she can get in a bath. CB# [phone]

## 2022-07-25 NOTE — Telephone Encounter (Signed)
Left a detailed message on patient voicemail to return call to the office

## 2022-07-25 NOTE — Telephone Encounter (Signed)
Patient stated that she has been  having occasionally left foot spasms since last week.

## 2022-07-25 NOTE — Telephone Encounter (Signed)
Yes she can get into the bath.  She should be wearing compression socks to help with the swelling, as sometimes it takes longer for resolution of swelling post procedure.  How long has she had the stabbing in the foot?

## 2022-07-25 NOTE — Telephone Encounter (Signed)
Patient had left le angio on 06/10/22. Please Advise

## 2022-08-13 ENCOUNTER — Ambulatory Visit: Payer: Medicaid Other | Admitting: Gastroenterology

## 2022-08-28 ENCOUNTER — Telehealth (INDEPENDENT_AMBULATORY_CARE_PROVIDER_SITE_OTHER): Payer: Self-pay | Admitting: Vascular Surgery

## 2022-08-28 NOTE — Telephone Encounter (Signed)
She can come in sooner to have her studies re-evalated, to determine if this is vascular in nature or not.

## 2022-08-28 NOTE — Telephone Encounter (Signed)
Patient called and stated in her left leg where the angio was done in the calf, she keep having spasms and its been going on off and on for the last three days.  Inside of left knee, she is having a lot of pain.  Patient would like to know if she should come in and be checked out.  Please advise.

## 2022-09-04 ENCOUNTER — Ambulatory Visit (INDEPENDENT_AMBULATORY_CARE_PROVIDER_SITE_OTHER): Payer: Commercial Managed Care - HMO

## 2022-09-04 DIAGNOSIS — I70222 Atherosclerosis of native arteries of extremities with rest pain, left leg: Secondary | ICD-10-CM

## 2022-09-12 ENCOUNTER — Ambulatory Visit: Payer: Medicaid Other | Admitting: Family Medicine

## 2022-09-12 VITALS — BP 151/77 | HR 68 | Ht 66.0 in | Wt 203.2 lb

## 2022-09-12 DIAGNOSIS — I10 Essential (primary) hypertension: Secondary | ICD-10-CM

## 2022-09-12 DIAGNOSIS — M25512 Pain in left shoulder: Secondary | ICD-10-CM | POA: Diagnosis not present

## 2022-09-12 NOTE — Patient Instructions (Signed)
It was great seeing you today!  Today we discussed your shoulder pain, we will get imaging to make sure there is no abnormality. Please do not do any activity that causes you to be in pain. Alternating between ice and heat can help.  Please follow up at your next scheduled appointment, if anything arises between now and then, please don't hesitate to contact our office.   Thank you for allowing [place] to be a part of your medical care!  Thank you, Dr. Robyne Peers  Also a reminder of our clinic's no-show policy. Please make sure to arrive at least 15 minutes prior to your scheduled appointment time. Please try to cancel before 24 hours if you are not able to make it. If you no-show for 2 appointments then you will be receiving a warning letter. If you no-show after 3 visits, then you may be at risk of being dismissed from our clinic. This is to ensure that everyone is able to be seen in a timely manner. Thank you, we appreciate your assistance with this!

## 2022-09-12 NOTE — Progress Notes (Signed)
    SUBJECTIVE:   CHIEF COMPLAINT / HPI:   Patient presents with concern of acute on chronic left shoulder pain that started 2 weeks ago. She does not recall what she was doing when the pain started. Started out with an aching sensation but has not worsened since it started. Describes aching as continuous but eases throughout the day. Denies any symptoms on the right side. Denies chest pain or dyspnea. History of rotator cuff on the right side. Aggravating factors include sleeping on that side. Relieving factors include resting and certain positions actually help. Denies that this feels like her right shoulder pain when she had her rotator cuff pain. Reports numbness and tingling occasionally in her hand but denies any in her shoulder. Denies any trauma or injury but she does sleep on that side at times.   OBJECTIVE:   BP (!) 151/77   Pulse 68   Ht 5\' 6"  (1.676 m)   Wt 203 lb 4 oz (92.2 kg)   SpO2 100%   BMI 32.81 kg/m   General: Patient well-appearing, in no acute distress. CV: RRR, no murmurs or gallops auscultated Resp: CTAB, no wheezing, rales or rhonchi noted MSK: no gross deformity noted along shoulders bilaterally, no tenderness upon palpation, no erythema or edema noted, no crepitus with movement, full active and passive ROM of shoulders bilaterally with pain elicited upon left shoulder abduction and extension, negative Hawkin's and O-Brien's testing Neuro: 5/5 UE strength bilaterally, gross sensation intact   ASSESSMENT/PLAN:   Left shoulder pain -acute on chronic pain, per chart review patient has had multiple episodes of shoulder pain. Initial concern for atypical pain for ACS considered although patient no exhibiting any cardiac symptoms but precautions discussed and patient voiced understanding of this. Seems to be of MSK etiology.  -less like impingement but also considered rotator cuff etiology as patient has history of this. Thus will obtain imaging of left shoulder to  further assess for this. -conservative measures discussed including alternating between ice and heat along with the importance of doing regular stretching exercises to maintain ROM but instructed to now engage in strenuous activity or any activity that elicits pain -follow up as appropriate as we await imaging results likely will continue conservative management   HYPERTENSION, BENIGN -BP 151/77, not at goal but likely secondary to pain that has been experiencing -continue BP regimen  -follow up as appropriate, return precautions discussed     -PHQ-9 score of 1 with negative question 9 reviewed.    , DO [facility]

## 2022-09-14 DIAGNOSIS — M25512 Pain in left shoulder: Secondary | ICD-10-CM | POA: Insufficient documentation

## 2022-09-14 NOTE — Assessment & Plan Note (Signed)
>>  ASSESSMENT AND PLAN FOR LEFT SHOULDER PAIN WRITTEN ON 09/14/2022 11:18 AM BY Robyne Peers, ANUPA, DO  -acute on chronic pain, per chart review patient has had multiple episodes of shoulder pain. Initial concern for atypical pain for ACS considered although patient no exhibiting any cardiac symptoms but precautions discussed and patient voiced understanding of this. Seems to be of MSK etiology.  -less like impingement but also considered rotator cuff etiology as patient has history of this. Thus will obtain imaging of left shoulder to further assess for this. -conservative measures discussed including alternating between ice and heat along with the importance of doing regular stretching exercises to maintain ROM but instructed to now engage in strenuous activity or any activity that elicits pain -follow up as appropriate as we await imaging results likely will continue conservative management

## 2022-09-14 NOTE — Assessment & Plan Note (Signed)
-  acute on chronic pain, per chart review patient has had multiple episodes of shoulder pain. Initial concern for atypical pain for ACS considered although patient no exhibiting any cardiac symptoms but precautions discussed and patient voiced understanding of this. Seems to be of MSK etiology.  -less like impingement but also considered rotator cuff etiology as patient has history of this. Thus will obtain imaging of left shoulder to further assess for this. -conservative measures discussed including alternating between ice and heat along with the importance of doing regular stretching exercises to maintain ROM but instructed to now engage in strenuous activity or any activity that elicits pain -follow up as appropriate as we await imaging results likely will continue conservative management

## 2022-09-14 NOTE — Assessment & Plan Note (Signed)
-  BP 151/77, not at goal but likely secondary to pain that has been experiencing -continue BP regimen  -follow up as appropriate, return precautions discussed

## 2022-09-14 NOTE — Assessment & Plan Note (Signed)
>>  ASSESSMENT AND PLAN FOR HYPERTENSION, BENIGN WRITTEN ON 09/14/2022 11:19 AM BY GANTA, ANUPA, DO  -BP 151/77, not at goal but likely secondary to pain that has been experiencing -continue BP regimen  -follow up as appropriate, return precautions discussed

## 2022-09-17 ENCOUNTER — Ambulatory Visit
Admission: RE | Admit: 2022-09-17 | Discharge: 2022-09-17 | Disposition: A | Discharge status: Home / Self Care | Payer: Medicaid Other | Source: Ambulatory Visit | Attending: Family Medicine | Admitting: Family Medicine

## 2022-09-17 ENCOUNTER — Other Ambulatory Visit: Payer: Self-pay | Admitting: Family Medicine

## 2022-09-17 DIAGNOSIS — M19012 Primary osteoarthritis, left shoulder: Secondary | ICD-10-CM

## 2022-09-17 DIAGNOSIS — M25512 Pain in left shoulder: Secondary | ICD-10-CM

## 2022-09-29 ENCOUNTER — Ambulatory Visit: Payer: Medicaid Other

## 2022-10-03 ENCOUNTER — Other Ambulatory Visit: Payer: Self-pay | Admitting: Family Medicine

## 2022-10-03 MED ORDER — OMEPRAZOLE 40 MG PO CPDR: DELAYED_RELEASE_CAPSULE | ORAL | 3 refills | Status: DC

## 2022-10-03 NOTE — Telephone Encounter (Signed)
Medication pended to this encounter.   Talbot Grumbling, RN

## 2022-10-03 NOTE — Telephone Encounter (Signed)
Patient called upset due to pharmacy not sending [place] a refill request, she said she has been asking since Monday and still has not received nothing. I told her we have not received a request but I will put it back.   Omeprazole 40MG   *She is wanting a 90 day supply*  Please Advise.   Thanks!

## 2022-10-06 ENCOUNTER — Other Ambulatory Visit (INDEPENDENT_AMBULATORY_CARE_PROVIDER_SITE_OTHER): Payer: Self-pay | Admitting: Nurse Practitioner

## 2022-10-06 DIAGNOSIS — K047 Periapical abscess without sinus: Secondary | ICD-10-CM

## 2022-10-08 ENCOUNTER — Encounter (INDEPENDENT_AMBULATORY_CARE_PROVIDER_SITE_OTHER): Payer: Self-pay | Admitting: Nurse Practitioner

## 2022-10-08 ENCOUNTER — Ambulatory Visit (INDEPENDENT_AMBULATORY_CARE_PROVIDER_SITE_OTHER): Payer: Medicaid Other | Admitting: Nurse Practitioner

## 2022-10-08 ENCOUNTER — Ambulatory Visit (INDEPENDENT_AMBULATORY_CARE_PROVIDER_SITE_OTHER): Payer: Medicaid Other

## 2022-10-08 VITALS — BP 150/90 | HR 80 | Resp 16 | Wt 203.6 lb

## 2022-10-08 DIAGNOSIS — K047 Periapical abscess without sinus: Secondary | ICD-10-CM

## 2022-10-08 DIAGNOSIS — I10 Essential (primary) hypertension: Secondary | ICD-10-CM | POA: Diagnosis not present

## 2022-10-08 DIAGNOSIS — I70222 Atherosclerosis of native arteries of extremities with rest pain, left leg: Secondary | ICD-10-CM | POA: Diagnosis not present

## 2022-10-08 DIAGNOSIS — E119 Type 2 diabetes mellitus without complications: Secondary | ICD-10-CM

## 2022-10-13 LAB — VAS US ABI WITH/WO TBI
Left ABI: 1.15
Right ABI: 1.15

## 2022-10-23 LAB — HM DIABETES EYE EXAM

## 2022-10-26 ENCOUNTER — Encounter (INDEPENDENT_AMBULATORY_CARE_PROVIDER_SITE_OTHER): Payer: Self-pay | Admitting: Nurse Practitioner

## 2022-10-26 NOTE — Progress Notes (Signed)
Subjective:    Patient ID: Mariah [name], female    DOB: 1961-07-11, 62 y.o.   MRN: [id number] Chief Complaint  Patient presents with   Follow-up    Ultrasound follow up    The patient returns to the office for followup and review of the noninvasive studies.   There have been no interval changes in lower extremity symptoms. No interval shortening of the patient's claudication distance or development of rest pain symptoms. No new ulcers or wounds have occurred since the last visit.  There have been no significant changes to the patient's overall health care.  The patient denies amaurosis fugax or recent TIA symptoms. There are no documented recent neurological changes noted. There is no history of DVT, PE or superficial thrombophlebitis. The patient denies recent episodes of angina or shortness of breath.   ABI Rt=1.15 and Lt=1.15  (previous ABI's Rt=1.06 and Lt=1.01) Duplex ultrasound of the left lower extremity shows triphasic waveforms throughout the left lower extremity.  Right lower extremity also has triphasic waveforms in the tibial vessels.  Normal toe waveforms are noted bilaterally.    Review of Systems  All other systems reviewed and are negative.      Objective:   Physical Exam Vitals reviewed.  HENT:     Head: Normocephalic.  Cardiovascular:     Rate and Rhythm: Normal rate.     Pulses: Normal pulses.  Pulmonary:     Effort: Pulmonary effort is normal.  Skin:    General: Skin is warm and dry.  Neurological:     Mental Status: She is alert and oriented to person, place, and time.  Psychiatric:        Mood and Affect: Mood normal.        Behavior: Behavior normal.        Thought Content: Thought content normal.        Judgment: Judgment normal.     BP (!) 150/90 (BP Location: Left Arm)   Pulse 80   Resp 16   Wt 203 lb 9.6 oz (92.4 kg)   BMI 32.86 kg/m   Past Medical History:  Diagnosis Date   Cramp of muscle of left upper extremity  06/24/2019   Decreased peripheral vision of left eye 08/19/2019   Degenerative disc disease, lumbar    Ear pain 11/21/2019   Elevated hemoglobin A1c 10/26/2019   Elevated hemoglobin A1c 11/23/2019   GERD (gastroesophageal reflux disease)    Hypertension    Insomnia 07/11/2010   Qualifier: Diagnosis of  By: Tye Savoy MD, [facility]     Mitral valve prolapse    Nasal congestion 04/01/2019   Numbness and tingling of right hand 01/29/2017   Pain and swelling of right knee 12/29/2019   Postprandial bloating 04/01/2019   Prediabetes 10/24/2016   Shortness of breath 11/21/2019   TIA (transient ischemic attack)     Social History   Socioeconomic History   Marital status: Married    Spouse name: Not on file   Number of children: Not on file   Years of education: Not on file   Highest education level: Not on file  Occupational History   Not on file  Tobacco Use   Smoking status: Former    Types: Cigarettes    Quit date: 09/16/1995    Years since quitting: 27.1    Passive exposure: Past   Smokeless tobacco: Never  Substance and Sexual Activity   Alcohol use: Yes   Drug use: Yes    Types:  Marijuana   Sexual activity: Yes    Birth control/protection: Condom  Other Topics Concern   Not on file  Social History Narrative   Not on file   Social Determinants of Health   Financial Resource Strain: Not on file  Food Insecurity: Not on file  Transportation Needs: Not on file  Physical Activity: Not on file  Stress: Not on file  Social Connections: Not on file  Intimate Partner Violence: Not on file    Past Surgical History:  Procedure Laterality Date   ABDOMINAL HYSTERECTOMY     ABDOMINAL HYSTERECTOMY     [place] Left 06/10/2022   Procedure: Lower Extremity Angiography;  Surgeon: Katha Cabal, MD;  Location: [place];  Service: Cardiovascular;  Laterality: Left;     Family History  Problem Relation Age of Onset   Lymphoma Mother    Lung cancer Father    Pulmonary embolism Brother    Colon cancer Neg Hx    Esophageal cancer Neg Hx    Liver cancer Neg Hx    Pancreatic cancer Neg Hx    Rectal cancer Neg Hx    Stomach cancer Neg Hx     Allergies  Allergen Reactions   Lisinopril     REACTION: COUGH   Moxifloxacin     REACTION: N \T\ V   Sulfa Antibiotics     Other reaction(s): Other (See Comments), Other (See Comments) Pt cannot remember rxn Pt cannot remember rxn    Sulfonamide Derivatives Other (See Comments)    Pt cannot remember rxn       Latest Ref Rng & Units 10/11/2018   10:38 AM 06/23/2017   10:05 AM 10/12/2016    2:25 AM  CBC  WBC 3.4 - 10.8 x10E3/uL 7.8  8.5  10.0   Hemoglobin 11.1 - 15.9 g/dL 13.0  14.1  12.7   Hematocrit 34.0 - 46.6 % 40.1  41.7  39.5   Platelets 150 - 450 x10E3/uL 238  260  252       CMP     Component Value Date/Time   NA 141 12/30/2021 1559   K 4.2 12/30/2021 1559   CL 100 12/30/2021 1559   CO2 23 12/30/2021 1559   GLUCOSE 97 12/30/2021 1559   GLUCOSE 100 (H) 06/23/2017 1005   BUN 11 06/10/2022 1126   BUN 13 12/30/2021 1559   CREATININE 0.56 06/10/2022 1126   CREATININE 0.80 11/02/2015 1433   CALCIUM 9.8 12/30/2021 1559   PROT 6.6 10/12/2016 0225   ALBUMIN 4.0 10/12/2016 0225   AST 17 10/12/2016 0225   ALT 15 10/12/2016 0225   ALKPHOS 58 10/12/2016 0225   BILITOT 0.4 10/12/2016 0225   GFRNONAA >60 06/10/2022 1126   GFRNONAA 84 11/02/2015 1433   GFRAA 102 10/21/2019 1625   GFRAA >89 11/02/2015 1433     VAS [place] ABI WITH/WO TBI  Result Date: 10/13/2022  LOWER EXTREMITY DOPPLER STUDY Patient Name:  Mariah ANNUNZIATA  Date of Exam:   10/08/2022 Medical Rec #: [id number]          Accession #:    [id number] Date of Birth: 10/20/1960          Patient Gender: F Patient Age:   43 years Exam Location:  [facility] Procedure:      VAS [place] ABI WITH/WO TBI  Referring Phys: Hortencia Pilar --------------------------------------------------------------------------------  Indications: Peripheral artery disease. High Risk Factors: Hypertension, hyperlipidemia.  Vascular Interventions: 06/10/2022 Percutaneous transluminal angioplasty and                         stent placement left superficial femoral and above-knee                         popliteal artery in 2 locations.                         Mechanical thrombectomy using the Rota Rex catheter left                         SFA and above-knee popliteal arteries.                         Percutaneous transluminal angioplasty of the left                         profunda femoris to 4 mm. Comparison Study: 09/04/2022 Performing Technologist: Almira Coaster RVS  Examination Guidelines: A complete evaluation includes at minimum, Doppler waveform signals and systolic blood pressure reading at the level of bilateral brachial, anterior tibial, and posterior tibial arteries, when vessel segments are accessible. Bilateral testing is considered an integral part of a complete examination. Photoelectric Plethysmograph (PPG) waveforms and toe systolic pressure readings are included as required and additional duplex testing as needed. Limited examinations for reoccurring indications may be performed as noted.  ABI Findings: +---------+------------------+-----+---------+--------+ Right    Rt Pressure (mmHg)IndexWaveform Comment  +---------+------------------+-----+---------+--------+ Brachial 127                                      +---------+------------------+-----+---------+--------+ ATA      133               1.02 triphasic         +---------+------------------+-----+---------+--------+ PTA      150               1.15 triphasic         +---------+------------------+-----+---------+--------+ Great Toe146               1.11 Normal            +---------+------------------+-----+---------+--------+  +---------+------------------+-----+---------+-------+ Left     Lt Pressure (mmHg)IndexWaveform Comment +---------+------------------+-----+---------+-------+ Brachial 131                                     +---------+------------------+-----+---------+-------+ ATA      150               1.15 triphasic        +---------+------------------+-----+---------+-------+ PTA      139               1.06 triphasic        +---------+------------------+-----+---------+-------+ Great Toe125               0.95 Normal           +---------+------------------+-----+---------+-------+ +-------+-----------+-----------+------------+------------+ ABI/TBIToday's ABIToday's TBIPrevious ABIPrevious TBI +-------+-----------+-----------+------------+------------+ Right  1.15       1.11       1.06        .  80          +-------+-----------+-----------+------------+------------+ Left   1.15       .95        1.01        .92          +-------+-----------+-----------+------------+------------+ Bilateral ABIs appear essentially unchanged compared to prior study on 09/04/2022. Right TBIs appear increased compared to prior study on 09/04/2022. Lt TBIsappear essentially unchanged compared to prior study on 09/04/2022.  Summary: Right: Resting right ankle-brachial index is within normal range. The right toe-brachial index is normal. Left: Resting left ankle-brachial index is within normal range. The left toe-brachial index is normal. *See table(s) above for measurements and observations.   Electronically signed by Hortencia Pilar MD on 10/13/2022 at 8:18:26 AM.    Final    VAS [place] ABI WITH/WO TBI  Result Date: 09/18/2022  LOWER EXTREMITY DOPPLER STUDY Patient Name:  CRISTELLE CLASON  Date of Exam:   09/04/2022 Medical Rec #: [id number]          Accession #:    [id number] Date of Birth: 05-10-1961          Patient Gender: F Patient Age:   89 years Exam Location:  [facility] Procedure:       VAS [place] ABI WITH/WO TBI Referring Phys: --------------------------------------------------------------------------------  Indications: Peripheral artery disease. High Risk Factors: Hypertension, hyperlipidemia.  Vascular Interventions: 06/10/2022 Percutaneous transluminal angioplasty and                         stent placement left superficial femoral and above-knee                         popliteal artery in 2 locations.                         Mechanical thrombectomy using the Rota Rex catheter left                         SFA and above-knee popliteal arteries.                         Percutaneous transluminal angioplasty of the left                         profunda femoris to 4 mm. Performing Technologist: Concha Norway RVT  Examination Guidelines: A complete evaluation includes at minimum, Doppler waveform signals and systolic blood pressure reading at the level of bilateral brachial, anterior tibial, and posterior tibial arteries, when vessel segments are accessible. Bilateral testing is considered an integral part of a complete examination. Photoelectric Plethysmograph (PPG) waveforms and toe systolic pressure readings are included as required and additional duplex testing as needed. Limited examinations for reoccurring indications may be performed as noted.  ABI Findings: +---------+------------------+-----+---------+--------+ Right    Rt Pressure (mmHg)IndexWaveform Comment  +---------+------------------+-----+---------+--------+ Brachial 143                                      +---------+------------------+-----+---------+--------+ ATA      141               0.99 triphasic         +---------+------------------+-----+---------+--------+ PTA      151  1.06 triphasic         +---------+------------------+-----+---------+--------+ Great Toe115               0.80 Normal            +---------+------------------+-----+---------+--------+  +---------+------------------+-----+--------+-------+ Left     Lt Pressure (mmHg)IndexWaveformComment +---------+------------------+-----+--------+-------+ Brachial 140                                    +---------+------------------+-----+--------+-------+ ATA      145               1.01 biphasic        +---------+------------------+-----+--------+-------+ PTA      143               1.00 biphasic        +---------+------------------+-----+--------+-------+ Great Toe131               0.92 Normal          +---------+------------------+-----+--------+-------+ +-------+-----------+-----------+------------+------------+ ABI/TBIToday's ABIToday's TBIPrevious ABIPrevious TBI +-------+-----------+-----------+------------+------------+ Right  1.06       .80        1.09        1.12         +-------+-----------+-----------+------------+------------+ Left   1.01       .92        1.16        1.14         +-------+-----------+-----------+------------+------------+ Bilateral ABIs appear essentially unchanged compared to prior study on 07/07/2022.  Summary: Right: Resting right ankle-brachial index is within normal range. The right toe-brachial index is normal. Left: Resting left ankle-brachial index is within normal range. The left toe-brachial index is normal. *See table(s) above for measurements and observations.  Electronically signed by Hortencia Pilar MD on 09/18/2022 at 11:52:19 AM.    Final        Assessment & Plan:   1. Atherosclerosis of native artery of left lower extremity with rest pain ([place])  Recommend:   The patient does not voice lifestyle limiting changes at this point in time.  Noninvasive studies do not suggest clinically significant change.  No invasive studies, angiography or surgery at this time The patient should continue walking and begin a more formal exercise program.  The patient should continue antiplatelet therapy and aggressive treatment of  the lipid abnormalities  No changes in the patient's medications at this time  Continued surveillance is indicated   The patient will continue follow up with noninvasive studies as ordered.   2. HYPERTENSION, BENIGN Continue antihypertensive medications as already ordered, these medications have been reviewed and there are no changes at this time.  3. Diabetes mellitus without complication ([place]) Continue hypoglycemic medications as already ordered, these medications have been reviewed and there are no changes at this time.  Hgb A1C to be monitored as already arranged by primary service   Current Outpatient Medications on File Prior to Visit  Medication Sig Dispense Refill   aspirin EC 81 MG tablet Take 1 tablet (81 mg total) by mouth daily. Swallow whole. 150 tablet 2   atorvastatin (LIPITOR) 80 MG tablet TAKE 1 TABLET(80 MG) BY MOUTH DAILY 90 tablet 3   azelastine (ASTELIN) 0.1 % nasal spray 1 puff in each nostril Nasally Twice a day     Blood Pressure KIT Check BP every other day, keep journal. 1 kit 0   clopidogrel (PLAVIX) 75 MG tablet Please take 75 mg (  1 tablet) daily as prescribed. 90 tablet 3   fluticasone (FLONASE) 50 MCG/ACT nasal spray INSTILL 2 SPRAY IN EACH NOSTRIL EVERY DAY 16 g 11   hydrochlorothiazide (HYDRODIURIL) 25 MG tablet TAKE 1 TABLET(25 MG) BY MOUTH DAILY 90 tablet 3   losartan (COZAAR) 25 MG tablet Take 25 mg by mouth daily.     omeprazole (PRILOSEC) 40 MG capsule TAKE 1 CAPSULE(40 MG) BY MOUTH DAILY 90 capsule 3   sodium chloride (OCEAN) 0.65 % nasal spray Place into the nose.     traMADol (ULTRAM) 50 MG tablet Take 1 tablet (50 mg total) by mouth every 6 (six) hours as needed. 20 tablet 0   UNABLE TO FIND Place 2 sprays into the nose as needed. Acetylcystine Nasal InhalerMed     No current facility-administered medications on file prior to visit.    There are no Patient Instructions on file for this visit. No follow-ups on file.   Kris Hartmann,  NP

## 2022-10-30 ENCOUNTER — Other Ambulatory Visit: Payer: Self-pay

## 2022-10-30 MED ORDER — CLOPIDOGREL BISULFATE 75 MG PO TABS: ORAL_TABLET | ORAL | 3 refills | Status: DC

## 2022-11-06 ENCOUNTER — Other Ambulatory Visit (HOSPITAL_COMMUNITY): Payer: Self-pay

## 2022-11-06 ENCOUNTER — Telehealth: Payer: Self-pay

## 2022-11-06 MED ORDER — CLOPIDOGREL BISULFATE 75 MG PO TABS
75.0000 mg | ORAL_TABLET | Freq: Every day | ORAL | 1 refills | Status: DC
  Filled 2022-11-06: qty 30, 30d supply, fill #0

## 2022-11-06 NOTE — Telephone Encounter (Signed)
Reviewed and agree with Dr Graylin Shiver plan.

## 2022-11-06 NOTE — Telephone Encounter (Signed)
Patient calls nurse line requesting samples of Plavix.   She reports her insurance is not covering prescription due to an "insurance problem."   I called the pharmacy for more clarification. Pharmacist reports she now has [employer], however Christella Scheuermann has not "cancelled" her previous policy with them yet. Reports this could take until the end of the month.   Will forward to pharmacy team for sample request.

## 2022-11-06 NOTE — Telephone Encounter (Signed)
Patient in the transition from one insurance to a [employer].    She requests assistance with Plavix (clopidogrel) 66m one daily - # 30 with 1 refill.   Patient provided instruction on location and cost of $5 per 30 day supply.   Patient relieved and plans to pick-up later today.

## 2022-11-07 ENCOUNTER — Other Ambulatory Visit: Payer: Self-pay

## 2022-11-07 ENCOUNTER — Other Ambulatory Visit (HOSPITAL_COMMUNITY): Payer: Self-pay

## 2022-11-13 ENCOUNTER — Ambulatory Visit: Payer: Medicaid Other | Admitting: Gastroenterology

## 2022-11-19 NOTE — Therapy (Signed)
OUTPATIENT PHYSICAL THERAPY SHOULDER EVALUATION   Patient Name: Mariah [name] MRN: [id number] DOB:February 17, 1961, 62 y.o., female Today's Date: 11/19/2022  END OF SESSION:   Past Medical History:  Diagnosis Date   Cramp of muscle of left upper extremity 06/24/2019   Decreased peripheral vision of left eye 08/19/2019   Degenerative disc disease, lumbar    Ear pain 11/21/2019   Elevated hemoglobin A1c 10/26/2019   Elevated hemoglobin A1c 11/23/2019   GERD (gastroesophageal reflux disease)    Hypertension    Insomnia 07/11/2010   Qualifier: Diagnosis of  By: Tye Savoy MD, [facility]     Mitral valve prolapse    Nasal congestion 04/01/2019   Numbness and tingling of right hand 01/29/2017   Pain and swelling of right knee 12/29/2019   Postprandial bloating 04/01/2019   Prediabetes 10/24/2016   Shortness of breath 11/21/2019   TIA (transient ischemic attack)    Past Surgical History:  Procedure Laterality Date   ABDOMINAL HYSTERECTOMY     ABDOMINAL HYSTERECTOMY     BACK SURGERY     CHOLECYSTECTOMY     JOINT REPLACEMENT     KNEE SURGERY     LOWER EXTREMITY ANGIOGRAPHY Left 06/10/2022   Procedure: Lower Extremity Angiography;  Surgeon: Katha Cabal, MD;  Location: [place] [facility];  Service: Cardiovascular;  Laterality: Left;   Patient Active Problem List   Diagnosis Date Noted   Left shoulder pain 09/14/2022   Trapezius muscle strain, right, initial encounter 03/21/2022   Sinus headache 01/15/2022   Left shoulder tendinitis 02/25/2021   Fasciculation 02/25/2021   Routine screening for STI (sexually transmitted infection) 01/22/2021   Chronic pain of both shoulders 01/10/2021   Functional dyspepsia 01/10/2021   Colon cancer screening 01/09/2021   Impacted cerumen of right ear 10/26/2020   High priority for COVID-19 virus vaccination 04/11/2020   Atherosclerosis of native arteries of extremity with intermittent claudication ([place]) 11/04/2019   Hypertrophy of inferior  nasal turbinate 07/07/2019   Nasal turbinate hypertrophy 07/07/2019   Eczema 02/11/2018   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   Hyperlipidemia 06/26/2017   TIA (transient ischemic attack) 06/23/2017   Prediabetes 10/24/2016   Health care maintenance 10/24/2016   Callus of foot 04/29/2016   Diabetes mellitus without complication ([place]) [id number]   Status post cervical spinal fusion 04/27/2015   Cervical myelopathy ([place]) 12/22/2014   Acute upper respiratory infection 08/25/2014   Vaginal discharge 02/21/2014   Benign paroxysmal positional vertigo 10/06/2013   Amaurosis fugax 08/16/2013   Edema of lower extremity 06/16/2013   Plantar wart 02/22/2013   Back pain 01/27/2013   Degenerative disc disease, lumbar    Mitral valve prolapse    Neck pain 01/03/2011   OBESITY 02/13/2009   Adiposity 02/13/2009   HYPERTENSION, BENIGN 08/07/2008   Benign essential hypertension 08/07/2008   Anxiety 02/26/2007   Allergic rhinitis 02/26/2007   GERD 02/26/2007   Atopic rhinitis 02/26/2007   Gastroesophageal reflux disease 02/26/2007    PCP: Donney Dice, DO  REFERRING PROVIDER: Martyn Malay, MD  REFERRING DIAG: Osteoarthritis of left shoulder, unspecified osteoarthritis type  THERAPY DIAG:  No diagnosis found.  Rationale for Evaluation and Treatment: Rehabilitation  ONSET DATE: ***   SUBJECTIVE:  SUBJECTIVE STATEMENT: ***  PERTINENT HISTORY: ***  PAIN:  Are you having pain? Yes:  NPRS scale: ***/10 Pain location: *** Pain description: *** Aggravating factors: *** Relieving factors: ***  PRECAUTIONS: {Therapy precautions:24002}  WEIGHT BEARING RESTRICTIONS: {Yes ***/No:24003}  FALLS:  Has patient fallen in last 6 months? {fallsyesno:27318}  LIVING ENVIRONMENT: Lives with: {[facility]  lives with:25569::"lives with their family"} Lives in: {Lives in:25570} Stairs: {opstairs:27293} Has following equipment at home: {Assistive devices:23999}  OCCUPATION: ***  PLOF: {PLOF:24004}  PATIENT GOALS:***   OBJECTIVE:  DIAGNOSTIC FINDINGS:  ***  PATIENT SURVEYS:  {rehab surveys:24030:a}  COGNITION: Overall cognitive status: {cognition:24006}     SENSATION: {sensation:27233}  POSTURE: ***  UPPER EXTREMITY ROM:   {AROM/PROM:27142} ROM Right eval Left eval  Shoulder flexion    Shoulder extension    Shoulder abduction    Shoulder adduction    Shoulder internal rotation    Shoulder external rotation    Elbow flexion    Elbow extension    Wrist flexion    Wrist extension    Wrist ulnar deviation    Wrist radial deviation    Wrist pronation    Wrist supination    (Blank rows = not tested)  UPPER EXTREMITY MMT:  MMT Right eval Left eval  Shoulder flexion    Shoulder extension    Shoulder abduction    Shoulder adduction    Shoulder internal rotation    Shoulder external rotation    Middle trapezius    Lower trapezius    Elbow flexion    Elbow extension    Wrist flexion    Wrist extension    Wrist ulnar deviation    Wrist radial deviation    Wrist pronation    Wrist supination    Grip strength (lbs)    (Blank rows = not tested)  SHOULDER SPECIAL TESTS: Impingement tests: {shoulder impingement test:25231:a} SLAP lesions: {SLAP lesions:25232} Instability tests: {shoulder instability test:25233} Rotator cuff assessment: {rotator cuff assessment:25234} Biceps assessment: {biceps assessment:25235}  JOINT MOBILITY TESTING:  ***  PALPATION:  ***    TODAY'S TREATMENT:          [facility] Adult PT Treatment:                                                DATE: *** Therapeutic Exercise: ***  PATIENT EDUCATION: Education details: Exam findings, POC, HEP Person educated: Patient Education method: Explanation, Demonstration, Tactile cues,  Verbal cues, and Handouts Education comprehension: verbalized understanding, returned demonstration, verbal cues required, tactile cues required, and needs further education  HOME EXERCISE PROGRAM: ***   ASSESSMENT: CLINICAL IMPRESSION: Patient is a 62 y.o. female who was seen today for physical therapy evaluation and treatment for ***.   OBJECTIVE IMPAIRMENTS: {opptimpairments:25111}.   ACTIVITY LIMITATIONS: {activitylimitations:27494}  PARTICIPATION LIMITATIONS: {participationrestrictions:25113}  PERSONAL FACTORS: {Personal factors:25162} are also affecting patient's functional outcome.   REHAB POTENTIAL: {rehabpotential:25112}  CLINICAL DECISION MAKING: {clinical decision making:25114}  EVALUATION COMPLEXITY: {Evaluation complexity:25115}   GOALS: Goals reviewed with patient? Yes  SHORT TERM GOALS: Target date: ***  Patient will be I with initial HEP in order to progress with therapy. Baseline: HEP provided at eval Goal status: INITIAL  2.  *** Baseline:  Goal status: INITIAL  3.  *** Baseline:  Goal status: INITIAL  LONG TERM GOALS: Target date: ***  Patient will be I with final HEP  to maintain progress from PT. Baseline: HEP provided at eval Goal status: INITIAL  2.  *** Baseline:  Goal status: INITIAL  3.  *** Baseline:  Goal status: INITIAL  4.  *** Baseline:  Goal status: INITIAL   PLAN: PT FREQUENCY: {rehab frequency:25116}  PT DURATION: {rehab duration:25117}  PLANNED INTERVENTIONS: {rehab planned interventions:25118::"Therapeutic exercises","Therapeutic activity","Neuromuscular re-education","Balance training","Gait training","Patient/Family education","Self Care","Joint mobilization"}  PLAN FOR NEXT SESSION: ***   Hilda Blades, PT, DPT, LAT, ATC 11/19/22  2:16 PM Phone: [phone] Fax: [phone]

## 2022-11-20 ENCOUNTER — Ambulatory Visit: Payer: Medicaid Other | Attending: Family Medicine | Admitting: Physical Therapy

## 2022-11-20 ENCOUNTER — Encounter: Payer: Self-pay | Admitting: Physical Therapy

## 2022-11-20 ENCOUNTER — Other Ambulatory Visit: Payer: Self-pay

## 2022-11-20 DIAGNOSIS — M19012 Primary osteoarthritis, left shoulder: Secondary | ICD-10-CM | POA: Insufficient documentation

## 2022-11-20 DIAGNOSIS — M25511 Pain in right shoulder: Secondary | ICD-10-CM | POA: Diagnosis not present

## 2022-11-20 DIAGNOSIS — G8929 Other chronic pain: Secondary | ICD-10-CM | POA: Diagnosis not present

## 2022-11-20 DIAGNOSIS — M6281 Muscle weakness (generalized): Secondary | ICD-10-CM | POA: Insufficient documentation

## 2022-11-20 DIAGNOSIS — M25512 Pain in left shoulder: Secondary | ICD-10-CM | POA: Diagnosis not present

## 2022-11-20 NOTE — Patient Instructions (Signed)
Access Code: [email or web address] URL: [email or web address] Date: 11/20/2022 Prepared by: Hilda Blades  Exercises - Supine Shoulder Flexion Extension AAROM with Dowel  - 2 x daily - 10 reps - 5 seconds hold - Standing Row with Anchored Resistance  - 2 x daily - 10 reps - Shoulder External Rotation with Anchored Resistance  - 2 x daily - 10 reps - Shoulder Flexion Wall Slide with Towel  - 2 x daily - 10 reps - 5 seconds hold

## 2022-11-27 ENCOUNTER — Ambulatory Visit: Payer: Medicaid Other | Admitting: Physical Therapy

## 2022-11-27 ENCOUNTER — Encounter: Payer: Self-pay | Admitting: Physical Therapy

## 2022-11-27 ENCOUNTER — Other Ambulatory Visit: Payer: Self-pay

## 2022-11-27 DIAGNOSIS — M6281 Muscle weakness (generalized): Secondary | ICD-10-CM | POA: Diagnosis not present

## 2022-11-27 DIAGNOSIS — M19012 Primary osteoarthritis, left shoulder: Secondary | ICD-10-CM | POA: Diagnosis not present

## 2022-11-27 DIAGNOSIS — G8929 Other chronic pain: Secondary | ICD-10-CM | POA: Diagnosis not present

## 2022-11-27 DIAGNOSIS — M25511 Pain in right shoulder: Secondary | ICD-10-CM | POA: Diagnosis not present

## 2022-11-27 DIAGNOSIS — M25512 Pain in left shoulder: Secondary | ICD-10-CM | POA: Diagnosis not present

## 2022-11-27 NOTE — Patient Instructions (Signed)
Access Code: [email or web address] URL: [email or web address] Date: 11/27/2022 Prepared by: Hilda Blades  Exercises - Supine Shoulder Flexion Extension AAROM with Dowel  - 1-2 x daily - 2 sets - 10 reps - 5 seconds hold - Standing Row with Anchored Resistance  - 1-2 x daily - 2 sets - 10 reps - Shoulder External Rotation with Anchored Resistance  - 1-2 x daily - 2 sets - 10 reps - Shoulder Internal Rotation with Resistance  - 1-2 x daily - 2 sets - 10 reps - Shoulder Flexion Wall Slide with Towel  - 1-2 x daily - 2 sets - 10 reps - 5 seconds hold

## 2022-11-27 NOTE — Therapy (Addendum)
OUTPATIENT PHYSICAL THERAPY TREATMENT NOTE  DISCHARGE   Patient Name: Mariah [name] MRN: [id number] DOB:Nov 03, 1960, 62 y.o., female Today's Date: 11/27/2022  PCP: Reece Leader, DO REFERRING PROVIDER: Westley Chandler, MD   END OF SESSION:   PT End of Session - 11/27/22 0853     Visit Number 2    Number of Visits 9    Date for PT Re-Evaluation 01/15/23    Authorization Type [employer]    Authorization Time Period 11/27/2022 - 01/25/2023    Authorization - Visit Number 1    Authorization - Number of Visits 8    PT Start Time 0849    PT Stop Time 0928    PT Time Calculation (min) 39 min    Activity Tolerance Patient tolerated treatment well    Behavior During Therapy [facility] for tasks assessed/performed             Past Medical History:  Diagnosis Date   Cramp of muscle of left upper extremity 06/24/2019   Decreased peripheral vision of left eye 08/19/2019   Degenerative disc disease, lumbar    Ear pain 11/21/2019   Elevated hemoglobin A1c 10/26/2019   Elevated hemoglobin A1c 11/23/2019   GERD (gastroesophageal reflux disease)    Hypertension    Insomnia 07/11/2010   Qualifier: Diagnosis of  By: Lelon Perla MD, [facility]     Mitral valve prolapse    Nasal congestion 04/01/2019   Numbness and tingling of right hand 01/29/2017   Pain and swelling of right knee 12/29/2019   Postprandial bloating 04/01/2019   Prediabetes 10/24/2016   Shortness of breath 11/21/2019   TIA (transient ischemic attack)    Past Surgical History:  Procedure Laterality Date   ABDOMINAL HYSTERECTOMY     ABDOMINAL HYSTERECTOMY     BACK SURGERY     CHOLECYSTECTOMY     JOINT REPLACEMENT     KNEE SURGERY     LOWER EXTREMITY ANGIOGRAPHY Left 06/10/2022   Procedure: Lower Extremity Angiography;  Surgeon: Renford Dills, MD;  Location: [facility];  Service: Cardiovascular;  Laterality: Left;   Patient Active Problem List   Diagnosis Date Noted   Left shoulder pain 09/14/2022    Trapezius muscle strain, right, initial encounter 03/21/2022   Sinus headache 01/15/2022   Left shoulder tendinitis 02/25/2021   Fasciculation 02/25/2021   Routine screening for STI (sexually transmitted infection) 01/22/2021   Chronic pain of both shoulders 01/10/2021   Functional dyspepsia 01/10/2021   Colon cancer screening 01/09/2021   Impacted cerumen of right ear 10/26/2020   High priority for COVID-19 virus vaccination 04/11/2020   Atherosclerosis of native arteries of extremity with intermittent claudication ([facility]) 11/04/2019   Hypertrophy of inferior nasal turbinate 07/07/2019   Nasal turbinate hypertrophy 07/07/2019   Eczema 02/11/2018   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   Hyperlipidemia 06/26/2017   TIA (transient ischemic attack) 06/23/2017   Prediabetes 10/24/2016   Health care maintenance 10/24/2016   Callus of foot 04/29/2016   Diabetes mellitus without complication ([facility]) 11/02/2015   Status post cervical spinal fusion 04/27/2015   Cervical myelopathy ([facility]) 12/22/2014   Acute upper respiratory infection 08/25/2014   Vaginal discharge 02/21/2014   Benign paroxysmal positional vertigo 10/06/2013   Amaurosis fugax 08/16/2013   Edema of lower extremity 06/16/2013   Plantar wart 02/22/2013   Back pain 01/27/2013   Degenerative disc disease, lumbar    Mitral valve prolapse    Neck pain 01/03/2011   OBESITY 02/13/2009  Adiposity 02/13/2009   HYPERTENSION, BENIGN 08/07/2008   Benign essential hypertension 08/07/2008   Anxiety 02/26/2007   Allergic rhinitis 02/26/2007   GERD 02/26/2007   Atopic rhinitis 02/26/2007   Gastroesophageal reflux disease 02/26/2007    REFERRING DIAG: Osteoarthritis of left shoulder, unspecified osteoarthritis type   THERAPY DIAG:  Chronic left shoulder pain  Chronic right shoulder pain  Muscle weakness (generalized)  Rationale for Evaluation and Treatment Rehabilitation  PERTINENT HISTORY: Right rotator cuff  surgery 2013   PRECAUTIONS: None    SUBJECTIVE:                                                                                                                                                                                     SUBJECTIVE STATEMENT:  Patient reports since doing the exercises she has been having less frequent pain, reports no pain currently. She did not get woken by pain last night.   PAIN:  Are you having pain? Yes:  NPRS scale: 0/10 (10/10 at night) Pain location: Bilateral shoulders Pain description: Aching Aggravating factors: Nocturnal pain, raise arms up Relieving factors: Rub and massage her shoulders, medication   OBJECTIVE: (objective measures completed at initial evaluation unless otherwise dated) PATIENT SURVEYS:  Quick Dash 61.4% disability  POSTURE: Rounded shoulder posture   UPPER EXTREMITY ROM:    Active ROM Right eval Left eval RT / LT  Shoulder flexion 160 140 160 / 155  Shoulder extension       Shoulder abduction 150 150   Shoulder adduction       Shoulder internal rotation T12 T10   Shoulder external rotation 35 / T2 50 / T4   Elbow flexion       Elbow extension       Wrist flexion       Wrist extension       Wrist ulnar deviation       Wrist radial deviation       Wrist pronation       Wrist supination       (Blank rows = not tested)   UPPER EXTREMITY MMT:   MMT Right eval Left eval  Shoulder flexion 4 4  Shoulder extension 5 4+  Shoulder abduction 4 4  Shoulder adduction      Shoulder internal rotation 5 5  Shoulder external rotation 4 4+  Middle trapezius      Lower trapezius      Elbow flexion      Elbow extension      Wrist flexion      Wrist extension      Wrist ulnar deviation      Wrist radial deviation  Wrist pronation      Wrist supination      Grip strength (lbs)      (Blank rows = not tested)   SHOULDER SPECIAL TESTS: Impingement tests: Hawkins/Kennedy impingement test: positive    PALPATION:   Global tenderness of bilateral shoulders               TODAY'S TREATMENT:          [facility] Adult PT Treatment:                                                DATE: 11/27/2022 Therapeutic Exercise: UBE L1 x 4 min (fwd/bwd) while taking subjective Overhead pulleys for shoulder elevation x 4 min Supine dowel flexion 2 x 10 Supine horizontal abduction with red 2 x 10 Row with green 2 x 10 Extension with red 2 x 10 ER with red 2 x 10 IR with red 2 x 10 Wall slide flexion x 10   [facility] Adult PT Treatment:                                                DATE: 11/20/2022 Therapeutic Exercise: Supine dowel flexion Row with red ER with red Wall slide flexion   PATIENT EDUCATION: Education details: HEP update Person educated: Patient Education method: Explanation, Demonstration, Tactile cues, Verbal cues, and Handouts Education comprehension: verbalized understanding, returned demonstration, verbal cues required, tactile cues required, and needs further education   HOME EXERCISE PROGRAM: Access Code: [email or web address]      ASSESSMENT: CLINICAL IMPRESSION: Patient tolerated therapy well with no adverse effects. Therapy focused on progressing her shoulder motion and strengthening with good tolerance. She does exhibit improved left shoulder active elevation this visit and was able to progress with banded resistance for rotator cuff and periscapular strengthening. She does require occasional cues to avoid shoulder shrug and ensure proper posture with exercises. No pain reported with therapy. Updated HEP to progress strengthening for home. Patient would benefit from continued skilled PT to progress her mobility and strength to reduce pain and maximize functional ability.    OBJECTIVE IMPAIRMENTS: decreased activity tolerance, decreased ROM, decreased strength, postural dysfunction, and pain.    ACTIVITY LIMITATIONS: carrying, lifting, sleeping, bathing, dressing, reach over head, and hygiene/grooming    PARTICIPATION LIMITATIONS: cleaning, laundry, and community activity   PERSONAL FACTORS: Fitness, Past/current experiences, and Time since onset of injury/illness/exacerbation are also affecting patient's functional outcome.      GOALS: Goals reviewed with patient? Yes   SHORT TERM GOALS: Target date: 12/18/2022   Patient will be I with initial HEP in order to progress with therapy. Baseline: HEP provided at eval Goal status: INITIAL   2.  Patient report bilateral shoulder nocturnal pain </= 6/10 in order to improve sleeping ability Baseline: patient reports 10/10 pain Goal status: INITIAL   3.  Patient will be able to demonstrate appropriate posture in order to allow for improved shoulder mechanics with movement and reduce pain Baseline: patient demonstrates rounded shoulder posture Goal status: INITIAL   LONG TERM GOALS: Target date: 01/15/2023   Patient will be I with final HEP to maintain progress from PT. Baseline: HEP provided at eval Goal status: INITIAL   2.  Patient will  report QuickDASH </= 40% disability in order to indicate improved functional ability with household tasks and using her arms Baseline: 61.4% disability Goal status: INITIAL   3.  Patient will demonstrate shoulder elevation >/= 160 deg in order to improve overhead reach and dressing ability. Baseline: limitations with left shoulder elevation noted above Goal status: INITIAL   4.  Patient will demonstrate shoulder strength 5/5 MMT in order to improve lifting and performing household tasks without pain or limitation Baseline: strength deficits of bilateral shoulders noted above Goal status: INITIAL     PLAN: PT FREQUENCY: 1x/week   PT DURATION: 8 weeks   PLANNED INTERVENTIONS: Therapeutic exercises, Therapeutic activity, Neuromuscular re-education, Balance training, Gait training, Patient/Family education, Self Care, Joint mobilization, Joint manipulation, Aquatic Therapy, Dry Needling, Spinal  manipulation, Spinal mobilization, Cryotherapy, Moist heat, Ionotophoresis 4mg /ml Dexamethasone, Manual therapy, and Re-evaluation   PLAN FOR NEXT SESSION: Review HEP and progress PRN, continue progressing shoulder AAROM>AROM as tolerated, postural and rotator cuff strengthening   Rosana Hoes, PT, DPT, LAT, ATC 11/27/22  9:32 AM Phone: [phone] Fax: [phone]     PHYSICAL THERAPY DISCHARGE SUMMARY  Visits from Start of Care: 2  Current functional level related to goals / functional outcomes: See above   Remaining deficits: See above   Education / Equipment: HEP   Patient agrees to discharge. Patient goals were not met. Patient is being discharged due to not returning since the last visit.  Rosana Hoes, PT, DPT, LAT, ATC 01/15/23  9:45 AM Phone: [phone] Fax: [phone]

## 2022-12-09 ENCOUNTER — Ambulatory Visit: Payer: Medicaid Other | Admitting: Physical Therapy

## 2022-12-10 ENCOUNTER — Other Ambulatory Visit: Payer: Self-pay

## 2022-12-10 ENCOUNTER — Telehealth: Payer: Self-pay | Admitting: Physical Therapy

## 2022-12-10 MED ORDER — LOSARTAN POTASSIUM 25 MG PO TABS: 25.0000 mg | ORAL_TABLET | Freq: Every day | ORAL | 0 refills | Status: DC

## 2022-12-10 NOTE — Telephone Encounter (Signed)
Attempted to contact patient due to missed appointment on 12/09/2022. Left VM informing patient of missed appointment and next scheduled appointment on 12/18/22. Reminded patient of attendance policy.  Hilda Blades, PT, DPT, LAT, ATC 12/10/22  4:22 PM Phone: [phone] Fax: [phone]

## 2022-12-11 ENCOUNTER — Ambulatory Visit (INDEPENDENT_AMBULATORY_CARE_PROVIDER_SITE_OTHER): Payer: Medicaid Other | Admitting: Vascular Surgery

## 2022-12-11 ENCOUNTER — Encounter (INDEPENDENT_AMBULATORY_CARE_PROVIDER_SITE_OTHER): Payer: Medicaid Other

## 2022-12-16 ENCOUNTER — Ambulatory Visit: Payer: Medicaid Other | Admitting: Family Medicine

## 2022-12-16 ENCOUNTER — Encounter: Payer: Self-pay | Admitting: Family Medicine

## 2022-12-16 VITALS — BP 136/74 | HR 64 | Ht 66.0 in | Wt 196.2 lb

## 2022-12-16 DIAGNOSIS — J309 Allergic rhinitis, unspecified: Secondary | ICD-10-CM | POA: Diagnosis not present

## 2022-12-16 MED ORDER — FLUTICASONE PROPIONATE 50 MCG/ACT NA SUSP: NASAL | 11 refills | Status: AC

## 2022-12-16 MED ORDER — BENZONATATE 100 MG PO CAPS: 100.0000 mg | ORAL_CAPSULE | Freq: Two times a day (BID) | ORAL | 0 refills | Status: DC | PRN

## 2022-12-16 MED ORDER — AZELASTINE HCL 0.1 % NA SOLN: NASAL | 2 refills | Status: DC

## 2022-12-16 NOTE — Assessment & Plan Note (Signed)
Clear rhinorrhea with post nasal drip x 2-3 weeks. Using Azelastine 2 puffs in AM without relief. Persistent cough. -Restart Flonase 1-2 puff in each nostril BID -Azelastine 2 puffs in each nostril BID during symptom flare and then return to 1 puff in each nostril -Benzonatate for cough symptoms at night

## 2022-12-16 NOTE — Progress Notes (Signed)
    SUBJECTIVE:   CHIEF COMPLAINT / HPI:   Post nasal drip Reports persistent allergy symptoms x 2-3 weeks. Endorses clear rhinorrhea that drain down her throat at night and makes her cough and keeps her up at night. Denies fever or discolored sputum production. Currently taking Azelastine in the AM but stopped taking Flonase. Difficulty breathing only when coughing a lot. Using over the counter Mucinex as needed.  PERTINENT  PMH / PSH: Allergic rhinitis  OBJECTIVE:   BP 136/74   Pulse 64   Ht 5\' 6"  (1.676 m)   Wt 196 lb 3.2 oz (89 kg)   SpO2 99%   BMI 31.67 kg/m    General: NAD, pleasant, able to participate in exam HEENT: TM clear bilaterally. Clear rhinorrhea. Non-erythematous throat. Moist mucus membranes. Non-erythematous conjunctiva Cardiac: RRR, no murmurs. Respiratory: CTAB, normal effort, No wheezes, rales or rhonchi Skin: warm and dry, no rashes noted   ASSESSMENT/PLAN:   Allergic rhinitis Clear rhinorrhea with post nasal drip x 2-3 weeks. Using Azelastine 2 puffs in AM without relief. Persistent cough. -Restart Flonase 1-2 puff in each nostril BID -Azelastine 2 puffs in each nostril BID during symptom flare and then return to 1 puff in each nostril -Benzonatate for cough symptoms at night   Dr. Colletta Maryland, [place]

## 2022-12-16 NOTE — Patient Instructions (Addendum)
It was wonderful to see you today! Thank you for choosing [facility].   Please bring ALL of your medications with you to every visit.   Today we talked about:  Please your your Flonase and Azelastine twice per day, 1 puff in each nostril in the morning and 1 puff in each nostril at night for the post-nasal drip. I am sending in prescription for Tessalon pearls you can uses twice per day as needed for cough. Mucinex is not covered by your insurance but you can get it over the counter. Preferred medications website: [email or web address]  Please follow up as needed for worsening symptoms  Call the clinic at [phone] if your symptoms worsen or you have any concerns.  Please be sure to schedule follow up at the front desk before you leave today.   Colletta Maryland, DO Family Medicine

## 2022-12-18 ENCOUNTER — Ambulatory Visit: Payer: Medicaid Other | Attending: Family Medicine | Admitting: Physical Therapy

## 2022-12-18 ENCOUNTER — Telehealth: Payer: Self-pay | Admitting: Physical Therapy

## 2022-12-18 NOTE — Telephone Encounter (Signed)
Left voicemail regarding second no-show to physical therapy. Requested she call to schedule 1 appointment at a time if she would like to continue. If she does not call, will assume she does not need further treatments.

## 2023-01-05 NOTE — Progress Notes (Unsigned)
    SUBJECTIVE:   CHIEF COMPLAINT / HPI:   Hernia? Abdominal surgery - hysterectomy and cholecystectomy  PERTINENT  PMH / PSH: ***  OBJECTIVE:   There were no vitals taken for this visit. ***  General: NAD, pleasant, able to participate in exam Cardiac: RRR, no murmurs. Respiratory: CTAB, normal effort, No wheezes, rales or rhonchi Abdomen: Bowel sounds present, nontender, nondistended Extremities: no edema or cyanosis. Skin: warm and dry, no rashes noted Neuro: alert, no obvious focal deficits Psych: Normal affect and mood  ASSESSMENT/PLAN:   No problem-specific Assessment & Plan notes found for this encounter.     Dr. Elberta Fortis, DO [facility]    {    This will disappear when note is signed, click to select method of visit    :1}

## 2023-01-06 ENCOUNTER — Encounter: Payer: Self-pay | Admitting: Family Medicine

## 2023-01-06 ENCOUNTER — Ambulatory Visit: Payer: Medicaid Other | Admitting: Family Medicine

## 2023-01-06 VITALS — BP 118/76 | HR 90 | Wt 195.0 lb

## 2023-01-06 DIAGNOSIS — R140 Abdominal distension (gaseous): Secondary | ICD-10-CM | POA: Diagnosis not present

## 2023-01-06 DIAGNOSIS — E1165 Type 2 diabetes mellitus with hyperglycemia: Secondary | ICD-10-CM | POA: Diagnosis not present

## 2023-01-06 DIAGNOSIS — I10 Essential (primary) hypertension: Secondary | ICD-10-CM

## 2023-01-06 DIAGNOSIS — R198 Other specified symptoms and signs involving the digestive system and abdomen: Secondary | ICD-10-CM | POA: Diagnosis not present

## 2023-01-06 DIAGNOSIS — K219 Gastro-esophageal reflux disease without esophagitis: Secondary | ICD-10-CM | POA: Diagnosis not present

## 2023-01-06 DIAGNOSIS — E119 Type 2 diabetes mellitus without complications: Secondary | ICD-10-CM

## 2023-01-06 NOTE — Assessment & Plan Note (Addendum)
A1c 7.0 in 12/2021. Patient declined repeat A1c and urine ACR today stating she wants to work on diet and then check it. Not currently on medication. -Referred to Dr. Gerilyn Pilgrim for nutrition support. Phone number provided and instructed to call -A1c and urine ACR at next visit

## 2023-01-06 NOTE — Patient Instructions (Signed)
It was wonderful to see you today! Thank you for choosing [facility].   Please bring ALL of your medications with you to every visit.   Today we talked about:  I ordered the blood work and we are calling to schedule an appointment to have the ultrasound of your abdomen done. Our office will call you with that appointment For nutrition discussion: Call Dr. Gerilyn Pilgrim (our nutritionist) to set up an appointment. Her phone number is: [phone].   Please follow up pending results   We are checking some labs today. If they are abnormal, I will call you. If they are normal, I will send you a MyChart message (if it is active) or a letter in the mail. If you do not hear about your labs in the next 2 weeks, please call the office.  Call the clinic at [phone] if your symptoms worsen or you have any concerns.  Please be sure to schedule follow up at the front desk before you leave today.   Elberta Fortis, DO Family Medicine

## 2023-01-06 NOTE — Assessment & Plan Note (Addendum)
Reports abdominal fullness after meals x 3 months. Occurs almost daily. Denies weight loss, night sweats, feeling full quickly or worsening GERD symptoms. Daily BMs. Possibly related to food intolerance (h/o lactose intolerance) vs obstructive vs underlying liver disease. Possible small hernia palpated in epigastric region on exam but unlikely causing symptoms. -CMP, CBC today -US abdomen complete -F/u GI for colonoscopy, patient needs to schedule appointment but has the information

## 2023-01-07 LAB — CBC WITH DIFFERENTIAL/PLATELET
Basophils Absolute: 0.1 10*3/uL (ref 0.0–0.2)
Basos: 1 %
EOS (ABSOLUTE): 0.1 10*3/uL (ref 0.0–0.4)
Eos: 2 %
Hematocrit: 39.7 % (ref 34.0–46.6)
Hemoglobin: 12.8 g/dL (ref 11.1–15.9)
Immature Grans (Abs): 0 10*3/uL (ref 0.0–0.1)
Immature Granulocytes: 0 %
Lymphocytes Absolute: 3.5 10*3/uL — ABNORMAL HIGH (ref 0.7–3.1)
Lymphs: 45 %
MCH: 27 pg (ref 26.6–33.0)
MCHC: 32.2 g/dL (ref 31.5–35.7)
MCV: 84 fL (ref 79–97)
Monocytes Absolute: 0.7 10*3/uL (ref 0.1–0.9)
Monocytes: 9 %
Neutrophils Absolute: 3.4 10*3/uL (ref 1.4–7.0)
Neutrophils: 43 %
Platelets: 289 10*3/uL (ref 150–450)
RBC: 4.74 x10E6/uL (ref 3.77–5.28)
RDW: 14.5 % (ref 11.7–15.4)
WBC: 7.8 10*3/uL (ref 3.4–10.8)

## 2023-01-07 LAB — COMPREHENSIVE METABOLIC PANEL WITH GFR
ALT: 19 [IU]/L (ref 0–32)
AST: 21 [IU]/L (ref 0–40)
Albumin/Globulin Ratio: 1.8 (ref 1.2–2.2)
Albumin: 4.7 g/dL (ref 3.9–4.9)
Alkaline Phosphatase: 105 [IU]/L (ref 44–121)
BUN/Creatinine Ratio: 16 (ref 12–28)
BUN: 11 mg/dL (ref 8–27)
Bilirubin Total: 0.4 mg/dL (ref 0.0–1.2)
CO2: 24 mmol/L (ref 20–29)
Calcium: 9.9 mg/dL (ref 8.7–10.3)
Chloride: 96 mmol/L (ref 96–106)
Creatinine, Ser: 0.67 mg/dL (ref 0.57–1.00)
Globulin, Total: 2.6 g/dL (ref 1.5–4.5)
Glucose: 127 mg/dL — ABNORMAL HIGH (ref 70–99)
Potassium: 4 mmol/L (ref 3.5–5.2)
Sodium: 136 mmol/L (ref 134–144)
Total Protein: 7.3 g/dL (ref 6.0–8.5)
eGFR: 99 mL/min/{1.73_m2}

## 2023-01-13 ENCOUNTER — Ambulatory Visit (HOSPITAL_COMMUNITY): Payer: Medicaid Other

## 2023-01-21 DIAGNOSIS — J343 Hypertrophy of nasal turbinates: Secondary | ICD-10-CM | POA: Diagnosis not present

## 2023-01-21 DIAGNOSIS — R0981 Nasal congestion: Secondary | ICD-10-CM | POA: Diagnosis not present

## 2023-01-21 DIAGNOSIS — J310 Chronic rhinitis: Secondary | ICD-10-CM | POA: Diagnosis not present

## 2023-02-04 ENCOUNTER — Ambulatory Visit (HOSPITAL_COMMUNITY)
Admission: RE | Admit: 2023-02-04 | Discharge: 2023-02-04 | Disposition: A | Discharge status: Home / Self Care | Payer: Medicaid Other | Source: Ambulatory Visit | Attending: Family Medicine | Admitting: Family Medicine

## 2023-02-04 DIAGNOSIS — R140 Abdominal distension (gaseous): Secondary | ICD-10-CM | POA: Diagnosis not present

## 2023-03-03 ENCOUNTER — Other Ambulatory Visit: Payer: Self-pay | Admitting: Family Medicine

## 2023-03-06 ENCOUNTER — Other Ambulatory Visit: Payer: Self-pay | Admitting: Family Medicine

## 2023-03-06 DIAGNOSIS — Z1231 Encounter for screening mammogram for malignant neoplasm of breast: Secondary | ICD-10-CM

## 2023-03-16 DIAGNOSIS — H5213 Myopia, bilateral: Secondary | ICD-10-CM | POA: Diagnosis not present

## 2023-03-17 ENCOUNTER — Telehealth (INDEPENDENT_AMBULATORY_CARE_PROVIDER_SITE_OTHER): Payer: Self-pay

## 2023-03-17 NOTE — Telephone Encounter (Signed)
Mariah [name] called asking would she be able to get cortisone shots for her back. She has been having back issues and her Dr. suggested the shots to her and she wanted to be sure that shots wouldn't harm anything with the stents. Her surgery was back on 06/10/2022.   Please advise

## 2023-03-17 NOTE — Telephone Encounter (Signed)
She said she's still not too sure, she would rather speak to you more about it when she comes in on her appt on 04/07/23.

## 2023-03-17 NOTE — Telephone Encounter (Signed)
Her last studies were good and her procedure was nearly a year ago, so she will be ok to proceed with cortisone shots.  If she needs to hold her plavix/asprin, before hand she is ok to do so

## 2023-03-25 ENCOUNTER — Other Ambulatory Visit (INDEPENDENT_AMBULATORY_CARE_PROVIDER_SITE_OTHER): Payer: Self-pay | Admitting: Nurse Practitioner

## 2023-03-25 DIAGNOSIS — I739 Peripheral vascular disease, unspecified: Secondary | ICD-10-CM

## 2023-04-07 ENCOUNTER — Ambulatory Visit (INDEPENDENT_AMBULATORY_CARE_PROVIDER_SITE_OTHER): Payer: Medicaid Other | Admitting: Nurse Practitioner

## 2023-04-07 ENCOUNTER — Ambulatory Visit (INDEPENDENT_AMBULATORY_CARE_PROVIDER_SITE_OTHER): Payer: Medicaid Other

## 2023-04-07 DIAGNOSIS — Z9889 Other specified postprocedural states: Secondary | ICD-10-CM | POA: Diagnosis not present

## 2023-04-07 DIAGNOSIS — I739 Peripheral vascular disease, unspecified: Secondary | ICD-10-CM

## 2023-04-08 LAB — VAS US ABI WITH/WO TBI
Left ABI: 0.89
Right ABI: 1.1

## 2023-04-21 ENCOUNTER — Ambulatory Visit: Payer: Medicaid Other

## 2023-04-24 ENCOUNTER — Telehealth: Payer: Self-pay | Admitting: Student

## 2023-04-24 NOTE — Telephone Encounter (Signed)
Clinical info completed on Surgical  form.  Placed form in covering PCP for Dr. Jena Gauss  for completion.    When form is completed, please route note to "RN Team" and place in wall pocket in front office.   Aquilla Solian, CMA

## 2023-04-24 NOTE — Telephone Encounter (Signed)
Patient dropped off surgical clearance form to e completed. Last DOS was 01/06/23. Placed in [employer].

## 2023-04-30 ENCOUNTER — Telehealth: Payer: Self-pay

## 2023-04-30 NOTE — Telephone Encounter (Signed)
-----   Message from Alfredo Martinez sent at 04/29/2023  2:46 PM EDT ----- Patient needs appt for surgical clearance.   Allee Geophysical data processor

## 2023-04-30 NOTE — Telephone Encounter (Signed)
Attempted to reach pt. No answer. Unable to LVM mailbox full. Pt needs to come in for in person surgical clearance visit. Aquilla Solian, CMA

## 2023-05-01 NOTE — Telephone Encounter (Signed)
2nd attempt to reach patient. No answer. Unable to LVM mailbox full. Patient needs to be seen for surgical clarence. Aquilla Solian, CMA

## 2023-05-06 DIAGNOSIS — H524 Presbyopia: Secondary | ICD-10-CM | POA: Diagnosis not present

## 2023-05-11 ENCOUNTER — Ambulatory Visit: Payer: Medicaid Other | Admitting: Student

## 2023-05-11 NOTE — Progress Notes (Addendum)
    SUBJECTIVE:   CHIEF COMPLAINT / HPI:   Surgery evaluation - arthroscopic shoulder surgery Orthopedic surgery via arthroscopy, requesting pre-op evaluation. H/o PAD on ASA and Plavix. Denies prior adverse event with anesthesia. Can walk up a flight a stairs without dyspnea or chest pain. Exercises including walking without concern. Notably snores at night, has not had a sleep study.  Diabetes Current Regimen: None CBGs: Not checking  Last A1c:  Lab Results  Component Value Date   HGBA1C 7.4 (A) 05/14/2023    Denies polyuria, polydipsia, hypoglycemia Last Eye Exam: DUE - discuss at follow up Statin: Atorvastatin 80mg  ACE/ARB: Losartan 25mg    PERTINENT  PMH / PSH: T2DM, HTN, PAD, GERD, chronic shoulder pain  OBJECTIVE:   BP (!) 140/71 (BP Location: Left Arm, Patient Position: Sitting, Cuff Size: Normal)   Pulse 71   Temp 98.1 F (36.7 C) (Oral)   Ht 5\' 6"  (1.676 m)   Wt 194 lb (88 kg)   SpO2 100%   BMI 31.31 kg/m    General: NAD, pleasant, able to participate in exam Cardiac: RRR, no murmurs. Respiratory: CTAB, normal effort, No wheezes, rales or rhonchi Abdomen: Soft, nontender, nondistended Extremities: no edema or cyanosis. Skin: warm and dry, no rashes noted Neuro: alert, no obvious focal deficits Psych: Normal affect and mood  ASSESSMENT/PLAN:   Benign essential hypertension 140/71, compliant on Hydrochlorothiazide 25mg  and Losartan 25mg . Discussed obtaining BP cuff and checking at home. -Refill BP meds -Advised patient to keep log of BP if able and f/u in 1 month  Chronic pain of both shoulders Arthroscopy surgery of L shoulder planned with Dr. Eulah Pont. Pre-op evaluation today, patient low risk and advised proceeding with surgery. -Pre-op paperwork completed -Advised patient to stop Plavix 5 days prior to procedure and take Aspirin the day of surgery -Patient did endorse loud snoring, will refer to Pulmonology for sleep study but would not delay  procedure pending results  Diabetes mellitus without complication (HCC) A1c 7.4, stable with prior. Provided RD information at last visit but patient having difficulty getting in contact. Will place formal referral and message Dr. Gerilyn Pilgrim. -ACR at follow visit, declined today -Discuss eye exam at follow up  Health care maintenance Insurance coverage concerns as patient has had difficult changing her name. Reports incomplete coverage and needs assistance. -Referred to Managed Medicaid for support   Dr. Elberta Fortis, DO [facility]

## 2023-05-12 ENCOUNTER — Other Ambulatory Visit: Payer: Self-pay

## 2023-05-13 MED ORDER — CLOPIDOGREL BISULFATE 75 MG PO TABS: 75.0000 mg | ORAL_TABLET | Freq: Every day | ORAL | 1 refills | Status: DC

## 2023-05-14 ENCOUNTER — Ambulatory Visit: Payer: Medicaid Other | Admitting: Family Medicine

## 2023-05-14 ENCOUNTER — Encounter: Payer: Self-pay | Admitting: Family Medicine

## 2023-05-14 VITALS — BP 140/71 | HR 71 | Temp 98.1°F | Ht 66.0 in | Wt 194.0 lb

## 2023-05-14 DIAGNOSIS — G8929 Other chronic pain: Secondary | ICD-10-CM

## 2023-05-14 DIAGNOSIS — R0683 Snoring: Secondary | ICD-10-CM | POA: Diagnosis not present

## 2023-05-14 DIAGNOSIS — Z0000 Encounter for general adult medical examination without abnormal findings: Secondary | ICD-10-CM | POA: Diagnosis not present

## 2023-05-14 DIAGNOSIS — M25511 Pain in right shoulder: Secondary | ICD-10-CM

## 2023-05-14 DIAGNOSIS — Z5989 Other problems related to housing and economic circumstances: Secondary | ICD-10-CM | POA: Diagnosis not present

## 2023-05-14 DIAGNOSIS — E119 Type 2 diabetes mellitus without complications: Secondary | ICD-10-CM

## 2023-05-14 DIAGNOSIS — M25512 Pain in left shoulder: Secondary | ICD-10-CM

## 2023-05-14 DIAGNOSIS — I10 Essential (primary) hypertension: Secondary | ICD-10-CM | POA: Diagnosis not present

## 2023-05-14 LAB — POCT GLYCOSYLATED HEMOGLOBIN (HGB A1C): Hemoglobin A1C: 7.4 % — AB (ref 4.0–5.6)

## 2023-05-14 MED ORDER — LOSARTAN POTASSIUM 25 MG PO TABS: 25.0000 mg | ORAL_TABLET | Freq: Every day | ORAL | 1 refills | Status: DC

## 2023-05-14 MED ORDER — HYDROCHLOROTHIAZIDE 25 MG PO TABS: ORAL_TABLET | ORAL | 3 refills | Status: DC

## 2023-05-14 NOTE — Assessment & Plan Note (Signed)
Arthroscopy surgery of L shoulder planned with Dr. Eulah Pont. Pre-op evaluation today, patient low risk and advised proceeding with surgery. -Pre-op paperwork completed -Advised patient to stop Plavix 5 days prior to procedure and take Aspirin the day of surgery -Patient did endorse loud snoring, will refer to Pulmonology for sleep study but would not delay procedure pending results

## 2023-05-14 NOTE — Assessment & Plan Note (Signed)
Insurance coverage concerns as patient has had difficult changing her name. Reports incomplete coverage and needs assistance. -Referred to Managed Medicaid for support

## 2023-05-14 NOTE — Assessment & Plan Note (Addendum)
140/71, compliant on Hydrochlorothiazide 25mg  and Losartan 25mg . Discussed obtaining BP cuff and checking at home. -Refill BP meds -Advised patient to keep log of BP if able and f/u in 1 month

## 2023-05-14 NOTE — Patient Instructions (Addendum)
It was wonderful to see you today! Thank you for choosing [facility].   Please bring ALL of your medications with you to every visit.   Today we talked about:  I am referring you to a Pulmonologist to have a sleep study done. Our office will call you with this information. You are low risk for surgery. Please hold your Plavix 5 days prior to surgery. Please take your Aspirin, Atorvastatin and Omeprazole the day of your surgery and hold all other medications. I will reach out to Dr. Gerilyn Pilgrim about getting you scheduled for nutrition counseling. I provided her with your number and she will give you a call. I also referred you to the Medicaid social worker to help with coverage under your maiden name. Please continue to take your blood pressure medication and check it at home if you are able. The goal is under 130/80. I included a list of dentist below.  Please follow up in 3 months  Call the clinic at [phone] if your symptoms worsen or you have any concerns.  Please be sure to schedule follow up at the front desk before you leave today.   Elberta Fortis, DO Family Medicine    [facility] Dental List  [place] DDS     [phone]  Milus Banister, DDS (Spanish speaking)  [address]  [place]  [address]  Se habla espaol  From 3 to 35 years old  Parent may go with child   [facility]  [phone]  [address]  [place] [address]  Se habla espanol  Interpretation for other languages  Special needs children welcome   [facility]   [phone]  Dr. Orlean Patten  [address]  [place], [place] [address]  Se habla espaol  From birth to 12 years  Special needs children welcome     Melynda Ripple DDS    [phone]  [address]  [place] [address]  Se habla espaol  From 18 months to 19 years old  Parent may go with child   [facility] [place] DDS  [address] [place]   [phone]   Need to bring interpreter   [facility]  [address]  [place] [address]   [phone]  Has interpreters in common languages    [facility]   [address]  [place], [place] [address]  Phone: [phone]   Health Department  (two locations)    [facility]: [facility] at [address] [place], [place], [place] [address].For appointments and more information, call [phone].   [place]: [address], [place], [place] [address]. For appointments and more information, call [phone].

## 2023-05-14 NOTE — Assessment & Plan Note (Signed)
A1c 7.4, stable with prior. Provided RD information at last visit but patient having difficulty getting in contact. Will place formal referral and message Dr. Gerilyn Pilgrim. -ACR at follow visit, declined today -Discuss eye exam at follow up

## 2023-05-15 ENCOUNTER — Telehealth: Payer: Self-pay

## 2023-05-15 NOTE — Telephone Encounter (Signed)
..   Medicaid Managed Care   Unsuccessful Outreach Note  05/15/2023 Name: Mariah [name] MRN: [id number] DOB: [birth year]  Referred by: Alfredo Martinez, MD Reason for referral : Appointment   An unsuccessful telephone outreach was attempted today. The patient was referred to the case management team for assistance with care management and care coordination.   Follow Up Plan: A HIPAA compliant phone message was left for the patient providing contact information and requesting a return call.  The care management team will reach out to the patient again over the next 7 days.   Weston Settle Care Guide  [facility] Managed  Care Guide [facility]  [phone]

## 2023-05-27 ENCOUNTER — Other Ambulatory Visit: Payer: Medicaid Other

## 2023-05-27 NOTE — Patient Instructions (Signed)
Visit Information  Ms. Kren was given information about Medicaid Managed Care team care coordination services as a part of their [employer] Medicaid benefit. [name] verbally consented to engagement with the [facility] Managed Care team.   If you are experiencing a medical emergency, please call 911 or report to your local emergency department or urgent care.   If you have a non-emergency medical problem during routine business hours, please contact your provider's office and ask to speak with a nurse.   For questions related to your Healthy [facility] health plan, please call: [phone] or visit the homepage here: [email or web address]  If you would like to schedule transportation through your Healthy [facility] plan, please call the following number at least 2 days in advance of your appointment: [phone]  For information about your ride after you set it up, call Ride Assist at [phone]. Use this number to activate a Will Call pickup, or if your transportation is late for a scheduled pickup. Use this number, too, if you need to make a change or cancel a previously scheduled reservation.  If you need transportation services right away, call [phone]. The after-hours call center is staffed 24 hours to handle ride assistance and urgent reservation requests (including discharges) 365 days a year. Urgent trips include sick visits, hospital discharge requests and life-sustaining treatment.  Call the [facility] Line at [phone], at any time, 24 hours a day, 7 days a week. If you are in danger or need immediate medical attention call 911.  If you would like help to quit smoking, call 1-800-QUIT-NOW (804-140-5022) OR Espaol: 1-855-Djelo-Ya (1-601-093-2355) o para ms informacin haga clic aqu or Text READY to 732-202 to register via text  Ms. Prowse - following are the goals we discussed in your visit today:    Goals Addressed   None      Social Worker will follow up on 06/26/23.   Gus Puma, Kenard Gower, MHA [facility]  Managed Medicaid Social Worker [phone]   Following is a copy of your plan of care:  There are no care plans that you recently modified to display for this patient.

## 2023-05-27 NOTE — Patient Outreach (Signed)
Medicaid Managed Care Social Work Note  05/27/2023 Name:  Mariah [name] MRN:  [id number] DOB:  09-21-1960  Mariah [name] is an 62 y.o. year old female who is a primary patient of Alfredo Martinez, MD.  The Medicaid Managed Care Coordination team was consulted for assistance with:  Community Resources   Ms. Crocker was given information about Medicaid Managed Care Coordination team services today. [name] Patient agreed to services and verbal consent obtained.  Engaged with patient  for by telephone forinitial visit in response to referral for case management and/or care coordination services.   Assessments/Interventions:  Review of past medical history, allergies, medications, health status, including review of consultants reports, laboratory and other test data, was performed as part of comprehensive evaluation and provision of chronic care management services.  SDOH: (Social Determinant of Health) assessments and interventions performed: [name] completed a telephone outreach with patient. She states she has been divorced for 4 years and has provided DSS with all of her up to date information, but her Medicaid is still in her married name. [name] encouraged patient to call or go to DSS to see who her Medicaid worker is and get her contact information. Patient agreed. Patient states she does have a cut off notice for her utilities. She did contact [employer] but they informed her they do not assist with utility bills anymore. [name] will contact [employer] to inquire. Patient does receive foodstamps and receives 943 in disability each month. [name] will email utility resources.  Advanced Directives Status:  Not addressed in this encounter.  Care Plan                 Allergies  Allergen Reactions   Lisinopril     REACTION: COUGH   Moxifloxacin     REACTION: N \\T \ V   Sulfa Antibiotics     Other reaction(s): Other (See Comments), Other (See Comments) Pt cannot remember rxn Pt  cannot remember rxn    Sulfonamide Derivatives Other (See Comments)    Pt cannot remember rxn    Medications Reviewed Today   Medications were not reviewed in this encounter     Patient Active Problem List   Diagnosis Date Noted   Trapezius muscle strain, right, initial encounter 03/21/2022   Sinus headache 01/15/2022   Left shoulder tendinitis 02/25/2021   Fasciculation 02/25/2021   Chronic pain of both shoulders 01/10/2021   Functional dyspepsia 01/10/2021   Atherosclerosis of native arteries of extremity with intermittent claudication (HCC) 11/04/2019   Nasal turbinate hypertrophy 07/07/2019   Abdominal fullness 04/01/2019   Eczema 02/11/2018   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   Hyperlipidemia 06/26/2017   TIA (transient ischemic attack) 06/23/2017   Health care maintenance 10/24/2016   Diabetes mellitus without complication (HCC) 11/02/2015   Status post cervical spinal fusion 04/27/2015   Cervical myelopathy (HCC) 12/22/2014   Benign paroxysmal positional vertigo 10/06/2013   Amaurosis fugax 08/16/2013   Edema of lower extremity 06/16/2013   Plantar wart 02/22/2013   Degenerative disc disease, lumbar    Mitral valve prolapse    OBESITY 02/13/2009   Benign essential hypertension 08/07/2008   Anxiety 02/26/2007   Allergic rhinitis 02/26/2007   GERD 02/26/2007    Conditions to be addressed/monitored per PCP order:   utility resources  There are no care plans that you recently modified to display for this patient.   Follow up:  Patient agrees to Care Plan and Follow-up.  Plan: The Managed Medicaid  care management team will reach out to the patient again over the next 30 days.  Date/time of next scheduled Social Work care management/care coordination outreach:  06/26/23  Gus Puma, Kenard Gower, [facility]  Managed [facility] Social Worker [phone]

## 2023-06-02 ENCOUNTER — Ambulatory Visit: Payer: Medicaid Other | Admitting: Family Medicine

## 2023-06-02 NOTE — Progress Notes (Deleted)
Medical Nutrition Therapy Appt start time: 1330 end time: 1430 (1 hour) Primary concerns today: {MNT Concerns:21384}.   Relevant history/background: Mariah [name] was referred by Elberta Fortis, DO for MNT related to diabetes management (E11.9, Type 2 diabetes without complication, without long-term use of insulin).  Other diagnoses include HTN, HLD, obesity, and degenerative disc disease, h/o TIA, and atherosclerosis.  She was seen by me for MNT related to pre-diabetes and obesity in 2018.    Assessment:  [name] ***  Learning Readiness: Ready Change in progress ***  Usual eating pattern: *** meals and *** snacks per day. Frequent foods and beverages: ***.   Avoided foods: ***.   Usual physical activity: ***. Sleep: Estimates *** hours/night. Food security -  Within the past 12 months, did you run out or worry that you would run out of food, and not have money to get more?  {yes no:314532}    24-hr recall: (Up at  AM) B ( AM)-    Snk ( AM)-    L ( PM)-   Snk ( PM)-   D ( PM)-   Snk ( PM)-   Typical day? {yes no:314532}   Nutritional Diagnosis:  {CHL AMB NUTRITIONAL [phone]}  Handouts given during visit include: After-Visit Summary (AVS) ***  Demonstrated degree of understanding via:  Teach Back  Barriers to learning/adherence to lifestyle change: ***  For behavioral goals and recommendations, see Patient Instructions.    Monitoring/Evaluation:  Dietary intake, exercise, ***, and body weight {follow up:15908}.

## 2023-06-15 ENCOUNTER — Encounter (HOSPITAL_BASED_OUTPATIENT_CLINIC_OR_DEPARTMENT_OTHER): Payer: Self-pay

## 2023-06-26 ENCOUNTER — Other Ambulatory Visit: Payer: Medicaid Other

## 2023-06-26 NOTE — Patient Instructions (Signed)
Visit Information  Mariah [name] was given information about Medicaid Managed Care team care coordination services as a part of their [facility] Medicaid benefit. Mariah [name] verbally consented to engagement with the [facility] Managed Care team.   If you are experiencing a medical emergency, please call 911 or report to your local emergency department or urgent care.   If you have a non-emergency medical problem during routine business hours, please contact your provider's office and ask to speak with a nurse.   For questions related to your [facility] health plan, please call: [phone] or go here:[email or web address]  If you would like to schedule transportation through your [facility] plan, please call the following number at least 2 days in advance of your appointment: [phone].   You can also use the MTM portal or MTM mobile app to manage your rides. Reimbursement for transportation is available through [facility]! For the portal, please go to [email or web address]  Call the [facility] Crisis Line at [phone], at any time, 24 hours a day, 7 days a week. If you are in danger or need immediate medical attention call 911.  If you would like help to quit smoking, call 1-800-QUIT-NOW (915 063 3101) OR Espaol: 1-855-Djelo-Ya (2-725-366-4403) o para ms informacin haga clic aqu or Text READY to 474-259 to register via text  Mariah [name] - following are the goals we discussed in your visit today:   Goals Addressed   None      Social Worker will follow up in 30 days.   Gus Puma, Kenard Gower, MHA [facility]  Managed Medicaid Social Worker [phone]   Following is a copy of your plan of care:  There are no care plans that you recently modified to display for this patient.

## 2023-06-26 NOTE — Patient Outreach (Signed)
  Medicaid Managed Care Social Work Note  06/26/2023 Name:  Mariah [name] MRN:  [id number] DOB:  Jul 17, 1961  Mariah [name] is an 62 y.o. year old female who is a primary patient of Elberta Fortis, MD.  The Medicaid Managed Care Coordination team was consulted for assistance with:  [name]   Mariah [name] was given information about Medicaid Managed Care Coordination team services today. Mariah [name] Patient agreed to services and verbal consent obtained.  Engaged with patient  for by telephone forfollow up visit in response to referral for case management and/or care coordination services.   Assessments/Interventions:  Review of past medical history, allergies, medications, health status, including review of consultants reports, laboratory and other test data, was performed as part of comprehensive evaluation and provision of chronic care management services.  SDOH: (Social Determinant of Health) assessments and interventions performed: [name] completed a telephone outreach with patient, she states she did not receive the utility resources [name] sent. Patient and [name] agreed to mail resources this time. Patient states she switched to [name] at the beginning of October, but still has not been able to get her name changed.  Advanced Directives Status:  Not addressed in this encounter.  Care Plan                 Allergies  Allergen Reactions   Lisinopril     REACTION: COUGH   Moxifloxacin     REACTION: N \\T \ V   Sulfa Antibiotics     Other reaction(s): Other (See Comments), Other (See Comments) Pt cannot remember rxn Pt cannot remember rxn    Sulfonamide Derivatives Other (See Comments)    Pt cannot remember rxn    Medications Reviewed Today   Medications were not reviewed in this encounter     Patient Active Problem List   Diagnosis Date Noted   Trapezius muscle strain, right, initial encounter 03/21/2022   Sinus headache 01/15/2022   Left shoulder  tendinitis 02/25/2021   Fasciculation 02/25/2021   Chronic pain of both shoulders 01/10/2021   Functional dyspepsia 01/10/2021   Atherosclerosis of native arteries of extremity with intermittent claudication (HCC) 11/04/2019   Nasal turbinate hypertrophy 07/07/2019   Abdominal fullness 04/01/2019   Eczema 02/11/2018   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   Hyperlipidemia 06/26/2017   TIA (transient ischemic attack) 06/23/2017   Health care maintenance 10/24/2016   Diabetes mellitus without complication (HCC) 11/02/2015   Status post cervical spinal fusion 04/27/2015   Cervical myelopathy (HCC) 12/22/2014   Benign paroxysmal positional vertigo 10/06/2013   Amaurosis fugax 08/16/2013   Edema of lower extremity 06/16/2013   Plantar wart 02/22/2013   Degenerative disc disease, lumbar    Mitral valve prolapse    OBESITY 02/13/2009   Benign essential hypertension 08/07/2008   Anxiety 02/26/2007   Allergic rhinitis 02/26/2007   GERD 02/26/2007    Conditions to be addressed/monitored per PCP order:   utility resources  There are no care plans that you recently modified to display for this patient.   Follow up:  Patient agrees to Care Plan and Follow-up.  Plan: The Managed Medicaid care management team will reach out to the patient again over the next 30 days.  Date/time of next scheduled Social Work care management/care coordination outreach:  07/27/23  Gus Puma, Kenard Gower, [facility]  Managed [facility] Social Worker [phone]

## 2023-07-14 ENCOUNTER — Other Ambulatory Visit: Payer: Self-pay | Admitting: Student

## 2023-07-17 ENCOUNTER — Other Ambulatory Visit: Payer: Self-pay

## 2023-07-17 ENCOUNTER — Encounter (HOSPITAL_BASED_OUTPATIENT_CLINIC_OR_DEPARTMENT_OTHER): Payer: Self-pay | Admitting: Orthopedic Surgery

## 2023-07-17 NOTE — Progress Notes (Signed)
VM left with Tresa Endo at Dr Greig Right office re: orders for plavix hold prior to surgery

## 2023-07-22 ENCOUNTER — Encounter (HOSPITAL_BASED_OUTPATIENT_CLINIC_OR_DEPARTMENT_OTHER)
Admission: RE | Admit: 2023-07-22 | Discharge: 2023-07-22 | Disposition: A | Discharge status: Home / Self Care | Payer: Medicaid Other | Source: Ambulatory Visit | Attending: Orthopedic Surgery | Admitting: Orthopedic Surgery

## 2023-07-22 DIAGNOSIS — Z01818 Encounter for other preprocedural examination: Secondary | ICD-10-CM | POA: Diagnosis not present

## 2023-07-22 DIAGNOSIS — R9431 Abnormal electrocardiogram [ECG] [EKG]: Secondary | ICD-10-CM | POA: Insufficient documentation

## 2023-07-22 DIAGNOSIS — Z01810 Encounter for preprocedural cardiovascular examination: Secondary | ICD-10-CM | POA: Diagnosis present

## 2023-07-22 DIAGNOSIS — Z01812 Encounter for preprocedural laboratory examination: Secondary | ICD-10-CM | POA: Diagnosis present

## 2023-07-22 LAB — BASIC METABOLIC PANEL
Anion gap: 10 (ref 5–15)
BUN: 7 mg/dL — ABNORMAL LOW (ref 8–23)
CO2: 27 mmol/L (ref 22–32)
Calcium: 9.3 mg/dL (ref 8.9–10.3)
Chloride: 102 mmol/L (ref 98–111)
Creatinine, Ser: 0.76 mg/dL (ref 0.44–1.00)
GFR, Estimated: >60 mL/min (ref >60)
Glucose, Bld: 154 mg/dL — ABNORMAL HIGH (ref 70–99)
Potassium: 4 mmol/L (ref 3.5–5.1)
Sodium: 139 mmol/L (ref 135–145)

## 2023-07-22 NOTE — Progress Notes (Signed)
F/U voice mail left with Tresa Endo at Dr Greig Right office regarding plavix orders prior to surgery

## 2023-07-22 NOTE — Progress Notes (Signed)

## 2023-07-23 NOTE — Progress Notes (Signed)
LVM again for kelly to get patients plavix hold orders.

## 2023-07-27 ENCOUNTER — Other Ambulatory Visit: Payer: Self-pay

## 2023-07-27 NOTE — Patient Instructions (Signed)
  Medicaid Managed Care   Unsuccessful Outreach Note  07/27/2023 Name: Mariah [name] MRN: [id number] DOB: [birth year]  Referred by: Elberta Fortis, MD Reason for referral : High Risk Managed Medicaid (MM social work unsuccessful telephone outreach )   An unsuccessful telephone outreach was attempted today. The patient was referred to the case management team for assistance with care management and care coordination.   Follow Up Plan: A HIPAA compliant phone message was left for the patient providing contact information and requesting a return call.   Abelino Derrick, MHA [facility]  Managed [facility] Social Worker [phone]

## 2023-07-27 NOTE — Patient Outreach (Signed)
  Medicaid Managed Care   Unsuccessful Outreach Note  07/27/2023 Name: TYLIN SAGERT MRN: [id number] DOB: [birth year]  Referred by: Elberta Fortis, MD Reason for referral : High Risk Managed Medicaid (MM social work unsuccessful telephone outreach )   An unsuccessful telephone outreach was attempted today. The patient was referred to the case management team for assistance with care management and care coordination.   Follow Up Plan: A HIPAA compliant phone message was left for the patient providing contact information and requesting a return call.   Abelino Derrick, MHA [facility]  Managed [facility] Social Worker [phone]

## 2023-07-28 ENCOUNTER — Ambulatory Visit (HOSPITAL_BASED_OUTPATIENT_CLINIC_OR_DEPARTMENT_OTHER): Admission: RE | Admit: 2023-07-28 | Payer: Medicaid Other | Source: Home / Self Care | Admitting: Orthopedic Surgery

## 2023-07-28 DIAGNOSIS — Z01818 Encounter for other preprocedural examination: Secondary | ICD-10-CM

## 2023-07-28 HISTORY — DX: Prediabetes: R73.03

## 2023-07-28 SURGERY — SHOULDER ARTHROSCOPY WITH ROTATOR CUFF REPAIR AND SUBACROMIAL DECOMPRESSION
Anesthesia: General | Laterality: Left

## 2023-07-30 ENCOUNTER — Encounter: Payer: Self-pay | Admitting: Family Medicine

## 2023-07-30 NOTE — Telephone Encounter (Signed)
Care team updated and letter sent for eye exam notes.

## 2023-07-31 ENCOUNTER — Other Ambulatory Visit: Payer: Self-pay

## 2023-07-31 MED ORDER — ATORVASTATIN CALCIUM 80 MG PO TABS: ORAL_TABLET | ORAL | 3 refills | Status: DC

## 2023-08-19 ENCOUNTER — Ambulatory Visit: Payer: Medicaid Other | Admitting: Family Medicine

## 2023-08-19 VITALS — BP 139/75 | HR 77 | Wt 194.6 lb

## 2023-08-19 DIAGNOSIS — L853 Xerosis cutis: Secondary | ICD-10-CM | POA: Insufficient documentation

## 2023-08-19 MED ORDER — ADAPALENE 0.1 % EX CREA: TOPICAL_CREAM | Freq: Every day | CUTANEOUS | 0 refills | Status: DC

## 2023-08-19 NOTE — Progress Notes (Signed)
    SUBJECTIVE:   CHIEF COMPLAINT / HPI:   Dry skin Has been going on for many months.  Thinks it could be due to aging.  Mostly on her face but sometimes on her arms as well.  Feels she has more blackheads on her face than before.  Skin never gets so dry that it breaks and bleeds.  No pain.  PERTINENT  PMH / PSH: TIA, hypertension, allergic rhinitis, diabetes, BPPV, eczema  OBJECTIVE:   BP 139/75   Pulse 77   Wt 194 lb 9.6 oz (88.3 kg)   SpO2 97%   BMI 31.41 kg/m   General: Alert and oriented, in NAD Skin: Moderately dry skin along face and forearms without evidence of fissuring, bleeding, overlying rash HEENT: NCAT, EOM grossly normal, midline nasal septum Respiratory: Breathing and speaking comfortably on RA Extremities: Moves all extremities grossly equally Neurological: No gross focal deficit Psychiatric: Appropriate mood and affect   ASSESSMENT/PLAN:   Dry skin Persistent problem.  Does not appear to be eczematous. Advised to trial nightly routine of exfoliation, moisturization (preferably with hyaluronic acid, collagen peptides, and sunscreen), and thin layer of adapalene gel.  Follow-up as needed.  Janeal Holmes, MD [facility]

## 2023-08-19 NOTE — Patient Instructions (Addendum)
I have sent in a prescription for adapalene.  Be sure to apply this only at night and only in a thin layer to reduce skin irritation from the sun. Check your moisturizer and see if it has sunscreen, hyaluronic acid, and peptides.  Try to get a moisturizer that has all of these and applied at night.  CeraVe night cream is also available over-the-counter and includes these elements.  Follow-up if you are still experiencing symptoms!

## 2023-08-19 NOTE — Assessment & Plan Note (Signed)
Persistent problem.  Does not appear to be eczematous. Advised to trial nightly routine of exfoliation, moisturization (preferably with hyaluronic acid, collagen peptides, and sunscreen), and thin layer of adapalene gel.  Follow-up as needed.

## 2023-09-12 ENCOUNTER — Other Ambulatory Visit: Payer: Self-pay | Admitting: Family Medicine

## 2023-09-13 NOTE — Progress Notes (Deleted)
09/15/23- 62 yoF for ? Sleep evaluation Medical problem list includes Hx TIA, Mitral Prolapse, ASCVD, Hyperlipidemia, Nasal Turbinate Hypertrophy, Allergic Rhinitis, GERD, Cervical Disc/ Fusion, Lumbar DDD, Obesity,  Epworth score- Body weight today-

## 2023-09-15 ENCOUNTER — Ambulatory Visit: Payer: Medicaid Other | Admitting: Internal Medicine

## 2023-09-22 ENCOUNTER — Ambulatory Visit: Payer: Medicaid Other | Admitting: Internal Medicine

## 2023-09-22 ENCOUNTER — Encounter: Payer: Self-pay | Admitting: Internal Medicine

## 2023-09-22 VITALS — BP 140/76 | HR 75 | Ht 66.0 in | Wt 162.6 lb

## 2023-09-22 DIAGNOSIS — R0683 Snoring: Secondary | ICD-10-CM

## 2023-09-22 NOTE — Patient Instructions (Signed)
 Order- schedule home sleep test    dx snoring  Please call me about 2 weeks after your sleep test for results and recommendations  Try otc nasal saline Gel as needed for dryness in your nose  Try Zero-Sugar Jolly Rancher hard candies (in bags at [employer], [employer]

## 2023-09-22 NOTE — Progress Notes (Addendum)
 09/15/23- 62 yoF for ? Sleep evaluation Medical problem list includes Hx TIA, Mitral Prolapse, ASCVD, Hyperlipidemia, Nasal Turbinate Hypertrophy, Allergic Rhinitis, GERD, Cervical Disc/ Fusion, Lumbar DDD, Obesity,  Epworth score-5 Body weight today- 192 lbs HPI The patient, with a history of degenerative disc disease and multiple neck surgeries, presents with snoring and difficulty sleeping. The patient reports waking up due to snoring and experiencing nasal congestion and post-nasal drip. The patient also reports difficulty falling asleep, often taking up to two hours to fall asleep. The patient describes waking up due to choking on mucus drainage and nasal congestion. The patient reports this occurs all year round and is not seasonally affected. The patient has a known history of allergies to dust mites, grass, and other allergens, and uses Flonase , azelastine , and saline mist for allergy management. The patient also reports dryness in the nose and throat, which is managed with a prescribed gel. The patient denies daytime sleepiness and does not consume caffeine. The patient has a heart murmur but denies any other known heart or lung conditions.  Prior to Admission medications   Medication Sig Start Date End Date Taking? Authorizing Provider  adapalene  (DIFFERIN ) 0.1 % cream Apply topically at bedtime. Apply a thin layer. 08/19/23  Yes Mabe, Elna, MD  atorvastatin  (LIPITOR) 80 MG tablet TAKE 1 TABLET(80 MG) BY MOUTH DAILY 07/31/23  Yes Theophilus Pagan, MD  azelastine  (ASTELIN ) 0.1 % nasal spray 1 puff in each nostril Nasally Twice a day 12/16/22  Yes Theophilus Pagan, MD  Blood Pressure KIT Check BP every other day, keep journal. 10/21/19  Yes Lenon Lacks C, DO  clopidogrel  (PLAVIX ) 75 MG tablet TAKE 1 TABLET(75 MG) BY MOUTH DAILY AS DIRECTED 09/14/23  Yes Theophilus Pagan, MD  fluticasone  (FLONASE ) 50 MCG/ACT nasal spray INSTILL 2 SPRAY IN EACH NOSTRIL EVERY DAY 12/16/22  Yes Theophilus Pagan, MD   hydrochlorothiazide  (HYDRODIURIL ) 25 MG tablet TAKE 1 TABLET(25 MG) BY MOUTH DAILY 05/14/23  Yes Theophilus Pagan, MD  losartan  (COZAAR ) 25 MG tablet Take 1 tablet (25 mg total) by mouth daily. 05/14/23  Yes Theophilus Pagan, MD  omeprazole  (PRILOSEC) 40 MG capsule TAKE 1 CAPSULE(40 MG) BY MOUTH DAILY 10/03/22  Yes Ganta, Anupa, DO  sodium chloride  (OCEAN) 0.65 % nasal spray Place into the nose.   Yes [provider]   Past Medical History:  Diagnosis Date   Cramp of muscle of left upper extremity 06/24/2019   Decreased peripheral vision of left eye 08/19/2019   Degenerative disc disease, lumbar    Ear pain 11/21/2019   Elevated hemoglobin A1c 10/26/2019   Elevated hemoglobin A1c 11/23/2019   GERD (gastroesophageal reflux disease)    Hypertension    Insomnia 07/11/2010   Qualifier: Diagnosis of  By: Zackary MD, [facility]     Mitral valve prolapse    Nasal congestion 04/01/2019   Numbness and tingling of right hand 01/29/2017   Pain and swelling of right knee 12/29/2019   Postprandial bloating 04/01/2019   Pre-diabetes    Prediabetes 10/24/2016   Shortness of breath 11/21/2019   TIA (transient ischemic attack)    Past Surgical History:  Procedure Laterality Date   ABDOMINAL HYSTERECTOMY     ABDOMINAL HYSTERECTOMY     BACK SURGERY     CHOLECYSTECTOMY     JOINT REPLACEMENT     KNEE SURGERY     LOWER EXTREMITY ANGIOGRAPHY Left 06/10/2022   Procedure: Lower Extremity Angiography;  Surgeon: Jama Cordella MATSU, MD;  Location: [facility] INVASIVE CV LAB;  Service: Cardiovascular;  Laterality: Left;   Family History  Problem Relation Age of Onset   Lymphoma Mother    Lung cancer Father    Pulmonary embolism Brother    Colon cancer Neg Hx    Esophageal cancer Neg Hx    Liver cancer Neg Hx    Pancreatic cancer Neg Hx    Rectal cancer Neg Hx    Stomach cancer Neg Hx    Social History   Socioeconomic History   Marital status: Divorced    Spouse name: Not on file   Number of  children: Not on file   Years of education: Not on file   Highest education level: 12th grade  Occupational History   Not on file  Tobacco Use   Smoking status: Former    Current packs/day: 0.00    Types: Cigarettes    Quit date: 09/16/1995    Years since quitting: 28.0    Passive exposure: Past   Smokeless tobacco: Never  Substance and Sexual Activity   Alcohol use: Yes    Alcohol/week: 5.0 standard drinks of alcohol    Types: 5 Cans of beer per week   Drug use: Yes    Types: Marijuana   Sexual activity: Yes    Birth control/protection: Condom  Other Topics Concern   Not on file  Social History Narrative   Not on file   Social Drivers of Health   Financial Resource Strain: Medium Risk (05/13/2023)   Overall Financial Resource Strain (CARDIA)    Difficulty of Paying Living Expenses: Somewhat hard  Food Insecurity: Food Insecurity Present (05/13/2023)   Hunger Vital Sign    Worried About Running Out of Food in the Last Year: Sometimes true    Ran Out of Food in the Last Year: Never true  Transportation Needs: No Transportation Needs (05/13/2023)   PRAPARE - Administrator, Civil Service (Medical): No    Lack of Transportation (Non-Medical): No  Physical Activity: Insufficiently Active (05/13/2023)   Exercise Vital Sign    Days of Exercise per Week: 3 days    Minutes of Exercise per Session: 30 min  Stress: No Stress Concern Present (05/13/2023)   [employer] of Occupational Health - Occupational Stress Questionnaire    Feeling of Stress : Not at all  Social Connections: Unknown (05/13/2023)   Social Connection and Isolation Panel [NHANES]    Frequency of Communication with Friends and Family: More than three times a week    Frequency of Social Gatherings with Friends and Family: Twice a week    Attends Religious Services: Patient declined    Database Administrator or Organizations: No    Attends Engineer, Structural: Not on file    Marital Status:  Divorced  Intimate Partner Violence: Not At Risk (05/27/2023)   Humiliation, Afraid, Rape, and Kick questionnaire    Fear of Current or Ex-Partner: No    Emotionally Abused: No    Physically Abused: No    Sexually Abused: No   ROS-see HPI   + = positive Constitutional:    weight loss, night sweats, fevers, chills, fatigue, lassitude. HEENT:    headaches, difficulty swallowing, tooth/dental problems, sore throat,       sneezing, itching, ear ache, nasal congestion, post nasal drip, snoring CV:    chest pain, orthopnea, PND, swelling in lower extremities, anasarca,  dizziness, palpitations Resp:   shortness of breath with exertion or at rest.                productive cough,   non-productive cough, coughing up of blood.              change in color of mucus.  wheezing.   Skin:    rash or lesions. GI:  No-   heartburn, indigestion, abdominal pain, nausea, vomiting, diarrhea,                 change in bowel habits, loss of appetite GU: dysuria, change in color of urine, no urgency or frequency.   flank pain. MS:   joint pain, stiffness, decreased range of motion, back pain. Neuro-     nothing unusual Psych:  change in mood or affect.  depression or anxiety.   memory loss.  OBJ- Physical Exam General- Alert, Oriented, Affect-appropriate, Distress- none acute Skin- rash-none, lesions- none, excoriation- none Lymphadenopathy- none Head- atraumatic            Eyes- Gross vision intact, PERRLA, conjunctivae and secretions clear            Ears- Hearing, canals-normal            Nose- Clear, no-Septal dev, mucus, polyps, erosion, perforation             Throat- Mallampati IV , mucosa clear , drainage- none, tonsils- atrophic, +teeth Neck- flexible , trachea midline, no stridor , thyroid  nl, carotid no bruit Chest - symmetrical excursion , unlabored           Heart/CV- RRR , no murmur heard , no gallop  , no rub, nl s1 s2                           - JVD- none  , edema- none, stasis changes- none, varices- none           Lung- clear to P&A, wheeze- none, cough- none , dullness-none, rub- none           Chest wall-  Abd-  Br/ Gen/ Rectal- Not done, not indicated Extrem- cyanosis- none, clubbing, none, atrophy- none, strength- nl Neuro- grossly intact to observation  Assessment and Plan    Suspected Sleep Apnea Reports snoring and waking up due to nasal congestion and postnasal drainage. No prior evaluation for sleep apnea. No daytime sleepiness reported. Physical examination reveals limited space in the throat due to long palate and thick tongue base. -Order home sleep test to evaluate for sleep apnea. -Contact patient in two weeks to discuss results.  Chronic Nasal Congestion and Postnasal Drainage Reports difficulty sleeping due to nasal congestion and postnasal drainage. Currently using Flonase , azelastine , and saline mist. History of allergies. -Recommend over-the-counter nasal saline gel for nasal dryness. -Consider over-the-counter sleep aids such as Benadryl , Z-Quil, or Unisom to help with sleep and potentially reduce postnasal drainage.  Insomnia Reports difficulty falling asleep, taking up to two hours. Expressed interest in medication to aid sleep. -Plan to address insomnia after results of sleep study to avoid oversedation. -Consider over-the-counter sleep aids such as Benadryl , Z-Quil, or Unisom in the interim.  General Health Maintenance -Recommend zero sugar Jolly Rancher candies for throat dryness.

## 2023-10-05 ENCOUNTER — Ambulatory Visit: Payer: Medicaid Other

## 2023-10-05 DIAGNOSIS — R0683 Snoring: Secondary | ICD-10-CM

## 2023-10-06 ENCOUNTER — Other Ambulatory Visit (INDEPENDENT_AMBULATORY_CARE_PROVIDER_SITE_OTHER): Payer: Self-pay | Admitting: Nurse Practitioner

## 2023-10-06 DIAGNOSIS — Z9889 Other specified postprocedural states: Secondary | ICD-10-CM

## 2023-10-13 ENCOUNTER — Encounter (HOSPITAL_COMMUNITY): Payer: Self-pay

## 2023-10-13 ENCOUNTER — Emergency Department (HOSPITAL_COMMUNITY): Payer: Medicaid Other

## 2023-10-13 ENCOUNTER — Observation Stay (HOSPITAL_COMMUNITY)
Admission: EM | Admit: 2023-10-13 | Discharge: 2023-10-15 | Disposition: A | Discharge status: Home / Self Care | Payer: Medicaid Other | Attending: Family Medicine | Admitting: Family Medicine

## 2023-10-13 ENCOUNTER — Ambulatory Visit (INDEPENDENT_AMBULATORY_CARE_PROVIDER_SITE_OTHER): Payer: Self-pay

## 2023-10-13 ENCOUNTER — Other Ambulatory Visit: Payer: Self-pay

## 2023-10-13 ENCOUNTER — Ambulatory Visit (INDEPENDENT_AMBULATORY_CARE_PROVIDER_SITE_OTHER): Payer: Medicaid Other | Admitting: Vascular Surgery

## 2023-10-13 DIAGNOSIS — Z87891 Personal history of nicotine dependence: Secondary | ICD-10-CM | POA: Insufficient documentation

## 2023-10-13 DIAGNOSIS — I214 Non-ST elevation (NSTEMI) myocardial infarction: Secondary | ICD-10-CM | POA: Diagnosis not present

## 2023-10-13 DIAGNOSIS — Z7902 Long term (current) use of antithrombotics/antiplatelets: Secondary | ICD-10-CM | POA: Insufficient documentation

## 2023-10-13 DIAGNOSIS — I10 Essential (primary) hypertension: Secondary | ICD-10-CM | POA: Insufficient documentation

## 2023-10-13 DIAGNOSIS — E119 Type 2 diabetes mellitus without complications: Secondary | ICD-10-CM

## 2023-10-13 DIAGNOSIS — R079 Chest pain, unspecified: Principal | ICD-10-CM | POA: Diagnosis present

## 2023-10-13 DIAGNOSIS — Z8673 Personal history of transient ischemic attack (TIA), and cerebral infarction without residual deficits: Secondary | ICD-10-CM | POA: Diagnosis not present

## 2023-10-13 DIAGNOSIS — Z79899 Other long term (current) drug therapy: Secondary | ICD-10-CM | POA: Insufficient documentation

## 2023-10-13 DIAGNOSIS — R0789 Other chest pain: Secondary | ICD-10-CM | POA: Diagnosis not present

## 2023-10-13 DIAGNOSIS — R7989 Other specified abnormal findings of blood chemistry: Secondary | ICD-10-CM | POA: Insufficient documentation

## 2023-10-13 DIAGNOSIS — Z9889 Other specified postprocedural states: Secondary | ICD-10-CM

## 2023-10-13 DIAGNOSIS — I739 Peripheral vascular disease, unspecified: Secondary | ICD-10-CM

## 2023-10-13 DIAGNOSIS — R778 Other specified abnormalities of plasma proteins: Secondary | ICD-10-CM | POA: Diagnosis not present

## 2023-10-13 LAB — BASIC METABOLIC PANEL
Anion gap: 9 (ref 5–15)
BUN: 18 mg/dL (ref 8–23)
CO2: 23 mmol/L (ref 22–32)
Calcium: 9.3 mg/dL (ref 8.9–10.3)
Chloride: 103 mmol/L (ref 98–111)
Creatinine, Ser: 0.84 mg/dL (ref 0.44–1.00)
GFR, Estimated: >60 mL/min (ref >60)
Glucose, Bld: 137 mg/dL — ABNORMAL HIGH (ref 70–99)
Potassium: 3.4 mmol/L — ABNORMAL LOW (ref 3.5–5.1)
Sodium: 135 mmol/L (ref 135–145)

## 2023-10-13 LAB — CBC
HCT: 40.8 % (ref 36.0–46.0)
Hemoglobin: 13.1 g/dL (ref 12.0–15.0)
MCH: 27 pg (ref 26.0–34.0)
MCHC: 32.1 g/dL (ref 30.0–36.0)
MCV: 84.1 fL (ref 80.0–100.0)
Platelets: 313 10*3/uL (ref 150–400)
RBC: 4.85 MIL/uL (ref 3.87–5.11)
RDW: 15.5 % (ref 11.5–15.5)
WBC: 7.2 10*3/uL (ref 4.0–10.5)
nRBC: 0 % (ref 0.0–0.2)

## 2023-10-13 LAB — TROPONIN I (HIGH SENSITIVITY)
Troponin I (High Sensitivity): 48 ng/L — ABNORMAL HIGH (ref <18)
Troponin I (High Sensitivity): 48 ng/L — ABNORMAL HIGH (ref <18)

## 2023-10-13 MED ORDER — ASPIRIN 81 MG PO CHEW
324.0000 mg | CHEWABLE_TABLET | Freq: Once | ORAL | Status: AC
  Administered 2023-10-13: 324 mg via ORAL
  Filled 2023-10-13: qty 4

## 2023-10-13 NOTE — ED Triage Notes (Signed)
Pt c/o L sided mid cp that started approx 1 hour ago; endorses sweaty palms, chills for the past 2 days; endorses some nausea in the morning that resolves after coughing up phlegm; denies pain currently; pt states she ate bruschetta with [place] for lunch and cp started shortly after; hx GERD, takes omeprazole  Verbal consent given for mse

## 2023-10-13 NOTE — Consult Note (Incomplete)
Cardiology Consultation   Patient ID: [name] MRN: [id number]; DOB: [birth year]  Admit date: 10/13/2023 Date of Consult: 10/13/2023  PCP:  Elberta Fortis, MD   [facility] Providers Cardiologist:  None   { Click here to update MD or APP on Care Team, Refresh:1}     Patient Profile:   Mariah [name] is a 63 y.o. female with a hx of hypertension,  type II diabetes, cerebral vascular accident, peripheral arterial disease status stent placement who is being seen 10/13/2023 for the evaluation of chest pain at the request of the Emergency Department.  History of Present Illness:   Mariah [name] states she was in her usual state of health when she developed sudden sternal onset chest pain that occurred around 11 AM this morning.  The symptoms happened about 45 minutes after she ate.  The symptoms were nonradiating, 2 out of 10 in severity.  The symptoms quickly resolved, however, her symptoms returned within a few hours.  This prompted ED visit.  She states her last episode was around 10 PM prior to being moved to the trauma bay.  States she has only had a few episodes of chest pain.  Denies shortness of breath, palpitations, nausea, vomiting, orthopnea.  Briefly, Mariah [name] is currently asymptomatic.  In the ED, vitals include HR 90, BP 132/69, SpO2 97% RA. Notable labs include hsTnT 48->48 (delta 4hr), K 3.4, sCr 0.84, WBC 7.2, Hgb 13.1, plt 313k. EC showed sinus rhythm without ST changes or Q waves.   Past Medical History:  Diagnosis Date  . Cramp of muscle of left upper extremity 06/24/2019  . Decreased peripheral vision of left eye 08/19/2019  . Degenerative disc disease, lumbar   . Ear pain 11/21/2019  . Elevated hemoglobin A1c 10/26/2019  . Elevated hemoglobin A1c 11/23/2019  . GERD (gastroesophageal reflux disease)   . Hypertension   . Insomnia 07/11/2010   Qualifier: Diagnosis of  By: Lelon Perla MD, Vickki Muff    . Mitral valve prolapse   . Nasal  congestion 04/01/2019  . Numbness and tingling of right hand 01/29/2017  . Pain and swelling of right knee 12/29/2019  . Postprandial bloating 04/01/2019  . Pre-diabetes   . Prediabetes 10/24/2016  . Shortness of breath 11/21/2019  . TIA (transient ischemic attack)     Past Surgical History:  Procedure Laterality Date  . ABDOMINAL HYSTERECTOMY    . ABDOMINAL HYSTERECTOMY    . BACK SURGERY    . CHOLECYSTECTOMY    . JOINT REPLACEMENT    . KNEE SURGERY    . LOWER EXTREMITY ANGIOGRAPHY Left 06/10/2022   Procedure: Lower Extremity Angiography;  Surgeon: Renford Dills, MD;  Location: [facility] INVASIVE CV LAB;  Service: Cardiovascular;  Laterality: Left;     {Home Medications (Optional):21181}  Inpatient Medications: Scheduled Meds: . aspirin  324 mg Oral Once   Continuous Infusions:  PRN Meds:   Allergies:    Allergies  Allergen Reactions  . Lisinopril     REACTION: COUGH  . Moxifloxacin     REACTION: N \\T \ V  . Sulfa Antibiotics     Other reaction(s): Other (See Comments), Other (See Comments) Pt cannot remember rxn Pt cannot remember rxn   . Sulfonamide Derivatives Other (See Comments)    Pt cannot remember rxn    Social History:   Social History   Socioeconomic History  . Marital status: Divorced    Spouse name: Not on file  . Number of children: Not  on file  . Years of education: Not on file  . Highest education level: 12th grade  Occupational History  . Not on file  Tobacco Use  . Smoking status: Former    Current packs/day: 0.00    Types: Cigarettes    Quit date: 09/16/1995    Years since quitting: 28.0    Passive exposure: Past  . Smokeless tobacco: Never  Substance and Sexual Activity  . Alcohol use: Yes    Alcohol/week: 5.0 standard drinks of alcohol    Types: 5 Cans of beer per week  . Drug use: Yes    Types: Marijuana  . Sexual activity: Yes    Birth control/protection: Condom  Other Topics Concern  . Not on file  Social History  Narrative  . Not on file   Social Drivers of Health   Financial Resource Strain: Medium Risk (05/13/2023)   Overall Financial Resource Strain (CARDIA)   . Difficulty of Paying Living Expenses: Somewhat hard  Food Insecurity: Food Insecurity Present (05/13/2023)   Hunger Vital Sign   . Worried About Programme researcher, broadcasting/film/video in the Last Year: Sometimes true   . Ran Out of Food in the Last Year: Never true  Transportation Needs: No Transportation Needs (05/13/2023)   PRAPARE - Transportation   . Lack of Transportation (Medical): No   . Lack of Transportation (Non-Medical): No  Physical Activity: Insufficiently Active (05/13/2023)   Exercise Vital Sign   . Days of Exercise per Week: 3 days   . Minutes of Exercise per Session: 30 min  Stress: No Stress Concern Present (05/13/2023)   [employer] of Occupational Health - Occupational Stress Questionnaire   . Feeling of Stress : Not at all  Social Connections: Unknown (05/13/2023)   Social Connection and Isolation Panel [NHANES]   . Frequency of Communication with Friends and Family: More than three times a week   . Frequency of Social Gatherings with Friends and Family: Twice a week   . Attends Religious Services: Patient declined   . Active Member of Clubs or Organizations: No   . Attends Banker Meetings: Not on file   . Marital Status: Divorced  Catering manager Violence: Not At Risk (05/27/2023)   Humiliation, Afraid, Rape, and Kick questionnaire   . Fear of Current or Ex-Partner: No   . Emotionally Abused: No   . Physically Abused: No   . Sexually Abused: No    Family History:   *** Family History  Problem Relation Age of Onset  . Lymphoma Mother   . Lung cancer Father   . Pulmonary embolism Brother   . Colon cancer Neg Hx   . Esophageal cancer Neg Hx   . Liver cancer Neg Hx   . Pancreatic cancer Neg Hx   . Rectal cancer Neg Hx   . Stomach cancer Neg Hx      ROS:  Please see the history of present illness.    All other ROS reviewed and negative.     Physical Exam/Data:   Vitals:   10/13/23 1633 10/13/23 1634 10/13/23 2301 10/13/23 2301  BP: (!) 160/75  (!) 139/57   Pulse: 63  69   Resp: 17  18   Temp:  98 F (36.7 C)  97.6 F (36.4 C)  TempSrc:  Oral  Oral  SpO2: 96%  100%   Weight:       No intake or output data in the 24 hours ending 10/13/23 2315    10/13/2023  12:25 PM 09/22/2023    9:42 AM 08/19/2023    9:05 AM  Last 3 Weights  Weight (lbs) 162 lb 7.7 oz 162 lb 9.6 oz 194 lb 9.6 oz  Weight (kg) 73.7 kg 73.755 kg 88.27 kg     Body mass index is 26.22 kg/m.  General:  Well nourished, well developed, in no acute distress*** HEENT: normal Neck: no JVD Vascular: No carotid bruits; Distal pulses 2+ bilaterally Cardiac:  normal S1, S2; RRR; no murmur *** Lungs:  clear to auscultation bilaterally, no wheezing, rhonchi or rales  Abd: soft, nontender, no hepatomegaly  Ext: no edema Musculoskeletal:  No deformities, BUE and BLE strength normal and equal Skin: warm and dry  Neuro:  CNs 2-12 intact, no focal abnormalities noted Psych:  Normal affect   EKG:  The EKG was personally reviewed and demonstrates:  *** Telemetry:  Telemetry was personally reviewed and demonstrates:  ***  Relevant CV Studies:  TTE 09/2019   IMPRESSIONS     1. Left ventricular ejection fraction, by visual estimation, is 55 to  60%. The left ventricle has normal function. There is mildly increased  left ventricular hypertrophy.   2. Left ventricular diastolic parameters are consistent with Grade II  diastolic dysfunction (pseudonormalization).   3. Elevated left atrial pressure.   4. Global right ventricle has normal systolic function.The right  ventricular size is normal. No increase in right ventricular wall  thickness.   5. Left atrial size was severely dilated.   6. Right atrial size was normal.   7. The mitral valve is abnormal. Mild to moderate mitral valve  regurgitation.   8. Moderate  thickening of the mitral valve leaflet(s).   9. The tricuspid valve is normal in structure.  10. The aortic valve is tricuspid. Aortic valve regurgitation is not  visualized. No evidence of aortic valve stenosis.  11. The pulmonic valve was not well visualized. Pulmonic valve  regurgitation is not visualized.  12. TR signal is inadequate for assessing pulmonary artery systolic  pressure.  13. The inferior vena cava is normal in size with greater than 50%  respiratory variability, suggesting right atrial pressure of 3 mmHg.   FINDINGS   Left Ventricle: Left ventricular ejection fraction, by visual estimation,  is 55 to 60%. The left ventricle has normal function. The left ventricle  has no regional wall motion abnormalities. There is mildly increased left  ventricular hypertrophy. Left  ventricular diastolic parameters are consistent with Grade II diastolic  dysfunction (pseudonormalization). Elevated left atrial pressure.   Laboratory Data:  High Sensitivity Troponin:   Recent Labs  Lab 10/13/23 1230 10/13/23 1600  TROPONINIHS 48* 48*     Chemistry Recent Labs  Lab 10/13/23 1230  NA 135  K 3.4*  CL 103  CO2 23  GLUCOSE 137*  BUN 18  CREATININE 0.84  CALCIUM 9.3  GFRNONAA >60  ANIONGAP 9    No results for input(s): "PROT", "ALBUMIN", "AST", "ALT", "ALKPHOS", "BILITOT" in the last 168 hours. Lipids No results for input(s): "CHOL", "TRIG", "HDL", "LABVLDL", "LDLCALC", "CHOLHDL" in the last 168 hours.  Hematology Recent Labs  Lab 10/13/23 1230  WBC 7.2  RBC 4.85  HGB 13.1  HCT 40.8  MCV 84.1  MCH 27.0  MCHC 32.1  RDW 15.5  PLT 313   Thyroid No results for input(s): "TSH", "FREET4" in the last 168 hours.  BNPNo results for input(s): "BNP", "PROBNP" in the last 168 hours.  DDimer No results for input(s): "DDIMER" in the  last 168 hours.   Radiology/Studies:  DG Chest 2 View Result Date: 10/13/2023 CLINICAL DATA:  Chest pain EXAM: CHEST - 2 VIEW COMPARISON:   04/20/2016 FINDINGS: The heart size and mediastinal contours are within normal limits. Both lungs are clear. The visualized skeletal structures are unremarkable. IMPRESSION: No active cardiopulmonary disease. Electronically Signed   By: Duanne Guess D.O.   On: 10/13/2023 13:26     Assessment and Plan:   ***   Risk Assessment/Risk Scores:  {Complete the following score calculators/questions to meet required metrics.  Press F2         :[id number]}   {Is the patient being seen for unstable angina, ACS, NSTEMI or STEMI?:[phone]} {Does this patient have CHF or CHF symptoms?      :[id number]} {Does this patient have ATRIAL FIBRILLATION?:[phone]}  {Are we signing off today?:[id number]}  For questions or updates, please contact [facility] Please consult [email or web address] for contact info under    Signed, Donavan Burnet, MD  10/13/2023 11:15 PM

## 2023-10-13 NOTE — Consult Note (Incomplete)
Cardiology Consultation   Patient ID: Mariah [name] MRN: [id number]; DOB: 07-21-1961  Admit date: 10/13/2023 Date of Consult: 10/13/2023  PCP:  Elberta Fortis, MD   [facility] Providers Cardiologist:  None   { Click here to update MD or APP on Care Team, Refresh:1}     Patient Profile:   Mariah [name] is a 63 y.o. female with a hx of hypertension,  type II diabetes, who is being seen 10/13/2023 for the evaluation of chest pain at the request of the Emergency Department.  History of Present Illness:   Ms. Raker ***   In the ED, vitals include HR 90, BP 132/69, SpO2 97% RA. Notable labs include hsTnT 48->48 (delta 4hr), K 3.4, sCr 0.84, WBC 7.2, Hgb 13.1, plt 313k. EC showed sinus rhythm without ST changes or Q waves.   Past Medical History:  Diagnosis Date   Cramp of muscle of left upper extremity 06/24/2019   Decreased peripheral vision of left eye 08/19/2019   Degenerative disc disease, lumbar    Ear pain 11/21/2019   Elevated hemoglobin A1c 10/26/2019   Elevated hemoglobin A1c 11/23/2019   GERD (gastroesophageal reflux disease)    Hypertension    Insomnia 07/11/2010   Qualifier: Diagnosis of  By: Mariah Perla MD, [facility]     Mitral valve prolapse    Nasal congestion 04/01/2019   Numbness and tingling of right hand 01/29/2017   Pain and swelling of right knee 12/29/2019   Postprandial bloating 04/01/2019   Pre-diabetes    Prediabetes 10/24/2016   Shortness of breath 11/21/2019   TIA (transient ischemic attack)     Past Surgical History:  Procedure Laterality Date   ABDOMINAL HYSTERECTOMY     ABDOMINAL HYSTERECTOMY     BACK SURGERY     CHOLECYSTECTOMY     JOINT REPLACEMENT     KNEE SURGERY     LOWER EXTREMITY ANGIOGRAPHY Left 06/10/2022   Procedure: Lower Extremity Angiography;  Surgeon: Renford Dills, MD;  Location: [facility] INVASIVE CV LAB;  Service: Cardiovascular;  Laterality: Left;     {Home Medications (Optional):21181}  Inpatient  Medications: Scheduled Meds:  aspirin  324 mg Oral Once   Continuous Infusions:  PRN Meds:   Allergies:    Allergies  Allergen Reactions   Lisinopril     REACTION: COUGH   Moxifloxacin     REACTION: N \\T \ V   Sulfa Antibiotics     Other reaction(s): Other (See Comments), Other (See Comments) Pt cannot remember rxn Pt cannot remember rxn    Sulfonamide Derivatives Other (See Comments)    Pt cannot remember rxn    Social History:   Social History   Socioeconomic History   Marital status: Divorced    Spouse name: Not on file   Number of children: Not on file   Years of education: Not on file   Highest education level: 12th grade  Occupational History   Not on file  Tobacco Use   Smoking status: Former    Current packs/day: 0.00    Types: Cigarettes    Quit date: 09/16/1995    Years since quitting: 28.0    Passive exposure: Past   Smokeless tobacco: Never  Substance and Sexual Activity   Alcohol use: Yes    Alcohol/week: 5.0 standard drinks of alcohol    Types: 5 Cans of beer per week   Drug use: Yes    Types: Marijuana   Sexual activity: Yes    Birth control/protection: Condom  Other Topics Concern   Not on file  Social History Narrative   Not on file   Social Drivers of Health   Financial Resource Strain: Medium Risk (05/13/2023)   Overall Financial Resource Strain (CARDIA)    Difficulty of Paying Living Expenses: Somewhat hard  Food Insecurity: Food Insecurity Present (05/13/2023)   Hunger Vital Sign    Worried About Running Out of Food in the Last Year: Sometimes true    Ran Out of Food in the Last Year: Never true  Transportation Needs: No Transportation Needs (05/13/2023)   PRAPARE - Administrator, Civil Service (Medical): No    Lack of Transportation (Non-Medical): No  Physical Activity: Insufficiently Active (05/13/2023)   Exercise Vital Sign    Days of Exercise per Week: 3 days    Minutes of Exercise per Session: 30 min  Stress: No  Stress Concern Present (05/13/2023)   [employer] of Occupational Health - Occupational Stress Questionnaire    Feeling of Stress : Not at all  Social Connections: Unknown (05/13/2023)   Social Connection and Isolation Panel [NHANES]    Frequency of Communication with Friends and Family: More than three times a week    Frequency of Social Gatherings with Friends and Family: Twice a week    Attends Religious Services: Patient declined    Database administrator or Organizations: No    Attends Engineer, structural: Not on file    Marital Status: Divorced  Intimate Partner Violence: Not At Risk (05/27/2023)   Humiliation, Afraid, Rape, and Kick questionnaire    Fear of Current or Ex-Partner: No    Emotionally Abused: No    Physically Abused: No    Sexually Abused: No    Family History:   *** Family History  Problem Relation Age of Onset   Lymphoma Mother    Lung cancer Father    Pulmonary embolism Brother    Colon cancer Neg Hx    Esophageal cancer Neg Hx    Liver cancer Neg Hx    Pancreatic cancer Neg Hx    Rectal cancer Neg Hx    Stomach cancer Neg Hx      ROS:  Please see the history of present illness.   All other ROS reviewed and negative.     Physical Exam/Data:   Vitals:   10/13/23 1633 10/13/23 1634 10/13/23 2301 10/13/23 2301  BP: (!) 160/75  (!) 139/57   Pulse: 63  69   Resp: 17  18   Temp:  98 F (36.7 C)  97.6 F (36.4 C)  TempSrc:  Oral  Oral  SpO2: 96%  100%   Weight:       No intake or output data in the 24 hours ending 10/13/23 2315    10/13/2023   12:25 PM 09/22/2023    9:42 AM 08/19/2023    9:05 AM  Last 3 Weights  Weight (lbs) 162 lb 7.7 oz 162 lb 9.6 oz 194 lb 9.6 oz  Weight (kg) 73.7 kg 73.755 kg 88.27 kg     Body mass index is 26.22 kg/m.  General:  Well nourished, well developed, in no acute distress*** HEENT: normal Neck: no JVD Vascular: No carotid bruits; Distal pulses 2+ bilaterally Cardiac:  normal S1, S2; RRR; no  murmur *** Lungs:  clear to auscultation bilaterally, no wheezing, rhonchi or rales  Abd: soft, nontender, no hepatomegaly  Ext: no edema Musculoskeletal:  No deformities, BUE and BLE strength normal and equal  Skin: warm and dry  Neuro:  CNs 2-12 intact, no focal abnormalities noted Psych:  Normal affect   EKG:  The EKG was personally reviewed and demonstrates:  *** Telemetry:  Telemetry was personally reviewed and demonstrates:  ***  Relevant CV Studies:  TTE 09/2019   IMPRESSIONS     1. Left ventricular ejection fraction, by visual estimation, is 55 to  60%. The left ventricle has normal function. There is mildly increased  left ventricular hypertrophy.   2. Left ventricular diastolic parameters are consistent with Grade II  diastolic dysfunction (pseudonormalization).   3. Elevated left atrial pressure.   4. Global right ventricle has normal systolic function.The right  ventricular size is normal. No increase in right ventricular wall  thickness.   5. Left atrial size was severely dilated.   6. Right atrial size was normal.   7. The mitral valve is abnormal. Mild to moderate mitral valve  regurgitation.   8. Moderate thickening of the mitral valve leaflet(s).   9. The tricuspid valve is normal in structure.  10. The aortic valve is tricuspid. Aortic valve regurgitation is not  visualized. No evidence of aortic valve stenosis.  11. The pulmonic valve was not well visualized. Pulmonic valve  regurgitation is not visualized.  12. TR signal is inadequate for assessing pulmonary artery systolic  pressure.  13. The inferior vena cava is normal in size with greater than 50%  respiratory variability, suggesting right atrial pressure of 3 mmHg.   FINDINGS   Left Ventricle: Left ventricular ejection fraction, by visual estimation,  is 55 to 60%. The left ventricle has normal function. The left ventricle  has no regional wall motion abnormalities. There is mildly increased left   ventricular hypertrophy. Left  ventricular diastolic parameters are consistent with Grade II diastolic  dysfunction (pseudonormalization). Elevated left atrial pressure.   Laboratory Data:  High Sensitivity Troponin:   Recent Labs  Lab 10/13/23 1230 10/13/23 1600  TROPONINIHS 48* 48*     Chemistry Recent Labs  Lab 10/13/23 1230  NA 135  K 3.4*  CL 103  CO2 23  GLUCOSE 137*  BUN 18  CREATININE 0.84  CALCIUM 9.3  GFRNONAA >60  ANIONGAP 9    No results for input(s): "PROT", "ALBUMIN", "AST", "ALT", "ALKPHOS", "BILITOT" in the last 168 hours. Lipids No results for input(s): "CHOL", "TRIG", "HDL", "LABVLDL", "LDLCALC", "CHOLHDL" in the last 168 hours.  Hematology Recent Labs  Lab 10/13/23 1230  WBC 7.2  RBC 4.85  HGB 13.1  HCT 40.8  MCV 84.1  MCH 27.0  MCHC 32.1  RDW 15.5  PLT 313   Thyroid No results for input(s): "TSH", "FREET4" in the last 168 hours.  BNPNo results for input(s): "BNP", "PROBNP" in the last 168 hours.  DDimer No results for input(s): "DDIMER" in the last 168 hours.   Radiology/Studies:  DG Chest 2 View Result Date: 10/13/2023 CLINICAL DATA:  Chest pain EXAM: CHEST - 2 VIEW COMPARISON:  04/20/2016 FINDINGS: The heart size and mediastinal contours are within normal limits. Both lungs are clear. The visualized skeletal structures are unremarkable. IMPRESSION: No active cardiopulmonary disease. Electronically Signed   By: Duanne Guess D.O.   On: 10/13/2023 13:26     Assessment and Plan:   ***   Risk Assessment/Risk Scores:  {Complete the following score calculators/questions to meet required metrics.  Press F2         :[id number]}   {Is the patient being seen for unstable angina, ACS, NSTEMI or  STEMI?:[phone]} {Does this patient have CHF or CHF symptoms?      :[id number]} {Does this patient have ATRIAL FIBRILLATION?:[phone]}  {Are we signing off today?:[id number]}  For questions or updates, please contact [facility] Please consult [email or web address] for contact info under    Signed, Donavan Burnet, MD  10/13/2023 11:15 PM

## 2023-10-13 NOTE — ED Notes (Signed)
Pt denies chest pain at this time, pt taken back to triage to have 2nd troponin collected.

## 2023-10-13 NOTE — ED Provider Triage Note (Signed)
Emergency Medicine Provider Triage Evaluation Note  Mariah [name] , a 63 y.o. female  was evaluated in triage.  Pt complains of intermittent chest pains since 1100 this AM. Episodes last a few seconds, with associated sweaty palms, chills x 2 days.   Review of Systems  Positive: Intermittent CP, nausea, chills, cough (chronic) Negative: SOB, leg swelling or pain  Physical Exam  BP 132/69   Pulse 90   Temp 98 F (36.7 C) (Oral)   Resp 16   Wt 73.7 kg   SpO2 97%   BMI 26.22 kg/m  Gen:   Awake, no distress   Resp:  Normal effort  MSK:   Moves extremities without difficulty  Other:  Ambulating normally  Medical Decision Making  Medically screening exam initiated at 12:41 PM.  Appropriate orders placed.  Mariah [name] was informed that the remainder of the evaluation will be completed by another provider, this initial triage assessment does not replace that evaluation, and the importance of remaining in the ED until their evaluation is complete.  Workup initiated   Su Monks, PA-C 10/13/23 1243

## 2023-10-13 NOTE — ED Provider Notes (Signed)
[facility]-EMERGENCY DEPT [facility] Emergency Department Provider Note MRN:  [id number]  Arrival date & time: 10/13/23     Chief Complaint   Chest Pain   History of Present Illness   Mariah [name] is a 63 y.o. year-old female with a history of stroke, PAD presenting to the ED with chief complaint of chest pain.  Brief sudden onset episode of left-sided chest pain at about 11 AM this morning.  Occurred while patient was at rest.  Described as a ache in the chest, nonradiating, associated with sweaty palms, malaise.  Lasted only a few minutes and then resolved.  Came back briefly about an hour later lasting only a few moments and then resolving, has been symptom-free since then.  Review of Systems  A thorough review of systems was obtained and all systems are negative except as noted in the HPI and PMH.   Patient's Health History    Past Medical History:  Diagnosis Date   Cramp of muscle of left upper extremity 06/24/2019   Decreased peripheral vision of left eye 08/19/2019   Degenerative disc disease, lumbar    Ear pain 11/21/2019   Elevated hemoglobin A1c 10/26/2019   Elevated hemoglobin A1c 11/23/2019   GERD (gastroesophageal reflux disease)    Hypertension    Insomnia 07/11/2010   Qualifier: Diagnosis of  By: Lelon Perla MD, [facility]     Mitral valve prolapse    Nasal congestion 04/01/2019   Numbness and tingling of right hand 01/29/2017   Pain and swelling of right knee 12/29/2019   Postprandial bloating 04/01/2019   Pre-diabetes    Prediabetes 10/24/2016   Shortness of breath 11/21/2019   TIA (transient ischemic attack)     Past Surgical History:  Procedure Laterality Date   ABDOMINAL HYSTERECTOMY     ABDOMINAL HYSTERECTOMY     BACK SURGERY     CHOLECYSTECTOMY     JOINT REPLACEMENT     KNEE SURGERY     LOWER EXTREMITY ANGIOGRAPHY Left 06/10/2022   Procedure: Lower Extremity Angiography;  Surgeon: Renford Dills, MD;  Location: [facility] INVASIVE CV LAB;  Service:  Cardiovascular;  Laterality: Left;    Family History  Problem Relation Age of Onset   Lymphoma Mother    Lung cancer Father    Pulmonary embolism Brother    Colon cancer Neg Hx    Esophageal cancer Neg Hx    Liver cancer Neg Hx    Pancreatic cancer Neg Hx    Rectal cancer Neg Hx    Stomach cancer Neg Hx     Social History   Socioeconomic History   Marital status: Divorced    Spouse name: Not on file   Number of children: Not on file   Years of education: Not on file   Highest education level: 12th grade  Occupational History   Not on file  Tobacco Use   Smoking status: Former    Current packs/day: 0.00    Types: Cigarettes    Quit date: 09/16/1995    Years since quitting: 28.0    Passive exposure: Past   Smokeless tobacco: Never  Substance and Sexual Activity   Alcohol use: Yes    Alcohol/week: 5.0 standard drinks of alcohol    Types: 5 Cans of beer per week   Drug use: Yes    Types: Marijuana   Sexual activity: Yes    Birth control/protection: Condom  Other Topics Concern   Not on file  Social History Narrative   Not on  file   Social Drivers of Health   Financial Resource Strain: Medium Risk (05/13/2023)   Overall Financial Resource Strain (CARDIA)    Difficulty of Paying Living Expenses: Somewhat hard  Food Insecurity: Food Insecurity Present (05/13/2023)   Hunger Vital Sign    Worried About Running Out of Food in the Last Year: Sometimes true    Ran Out of Food in the Last Year: Never true  Transportation Needs: No Transportation Needs (05/13/2023)   PRAPARE - Administrator, Civil Service (Medical): No    Lack of Transportation (Non-Medical): No  Physical Activity: Insufficiently Active (05/13/2023)   Exercise Vital Sign    Days of Exercise per Week: 3 days    Minutes of Exercise per Session: 30 min  Stress: No Stress Concern Present (05/13/2023)   [employer] of Occupational Health - Occupational Stress Questionnaire    Feeling of Stress  : Not at all  Social Connections: Unknown (05/13/2023)   Social Connection and Isolation Panel [NHANES]    Frequency of Communication with Friends and Family: More than three times a week    Frequency of Social Gatherings with Friends and Family: Twice a week    Attends Religious Services: Patient declined    Database administrator or Organizations: No    Attends Engineer, structural: Not on file    Marital Status: Divorced  Intimate Partner Violence: Not At Risk (05/27/2023)   Humiliation, Afraid, Rape, and Kick questionnaire    Fear of Current or Ex-Partner: No    Emotionally Abused: No    Physically Abused: No    Sexually Abused: No     Physical Exam   Vitals:   10/13/23 2301 10/13/23 2301  BP: (!) 139/57   Pulse: 69   Resp: 18   Temp:  97.6 F (36.4 C)  SpO2: 100%     CONSTITUTIONAL: Well-appearing, NAD NEURO/PSYCH:  Alert and oriented x 3, no focal deficits EYES:  eyes equal and reactive ENT/NECK:  no LAD, no JVD CARDIO: Regular rate, well-perfused, normal S1 and S2 PULM:  CTAB no wheezing or rhonchi GI/GU:  non-distended, non-tender MSK/SPINE:  No gross deformities, no edema SKIN:  no rash, atraumatic   *Additional and/or pertinent findings included in MDM below  Diagnostic and Interventional Summary    EKG Interpretation Date/Time:  Tuesday October 13 2023 12:30:17 EST Ventricular Rate:  81 PR Interval:  168 QRS Duration:  78 QT Interval:  392 QTC Calculation: 455 R Axis:   56  Text Interpretation: Sinus rhythm with marked sinus arrhythmia Nonspecific T wave abnormality Abnormal ECG When compared with ECG of 22-Jul-2023 10:04, PREVIOUS ECG IS PRESENT Confirmed by Kennis Carina [phone]) on 10/13/2023 11:02:23 PM       Labs Reviewed  BASIC METABOLIC PANEL - Abnormal; Notable for the following components:      Result Value   Potassium 3.4 (*)    Glucose, Bld 137 (*)    All other components within normal limits  TROPONIN I (HIGH SENSITIVITY) -  Abnormal; Notable for the following components:   Troponin I (High Sensitivity) 48 (*)    All other components within normal limits  TROPONIN I (HIGH SENSITIVITY) - Abnormal; Notable for the following components:   Troponin I (High Sensitivity) 48 (*)    All other components within normal limits  CBC    DG Chest 2 View  Final Result      Medications  aspirin chewable tablet 324 mg (324 mg Oral Given  10/13/23 2319)     Procedures  /  Critical Care .Critical Care  Performed by: Sabas Sous, MD Authorized by: Sabas Sous, MD   Critical care provider statement:    Critical care time (minutes):  32   Critical care was necessary to treat or prevent imminent or life-threatening deterioration of the following conditions: Chest pain with elevated troponin, concern for possible NSTEMI.   Critical care was time spent personally by me on the following activities:  Development of treatment plan with patient or surrogate, discussions with consultants, evaluation of patient's response to treatment, examination of patient, ordering and review of laboratory studies, ordering and review of radiographic studies, ordering and performing treatments and interventions, pulse oximetry, re-evaluation of patient's condition and review of old charts   ED Course and Medical Decision Making  Initial Impression and Ddx 63 year old female with multiple cardiovascular risk factors including PAD, stroke coming in with brief episode of chest pain.  Pain occurred soon after eating lunch and so GERD is considered but of course ACS is a concern given patient's risk factors.  Symptom-free for the past 11 hours or so, well-appearing on exam with normal vital signs.  Past medical/surgical history that increases complexity of ED encounter: PAD, stroke  Interpretation of Diagnostics I personally reviewed the EKG and my interpretation is as follows: Sinus rhythm with nonspecific findings  Labs without significant  blood count or electrolyte disturbance, troponin mildly elevated at 48 and flat upon repeat.  Patient Reassessment and Ultimate Disposition/Management     Case discussed with Dr. Derrell Lolling of cardiology who will see the patient and provide recommendations.  Patient appropriate for hospitalist admission.  Patient management required discussion with the following services or consulting groups:  Hospitalist Service and Cardiology  Complexity of Problems Addressed Acute illness or injury that poses threat of life of bodily function  Additional Data Reviewed and Analyzed Further history obtained from: Further history from spouse/family member  Additional Factors Impacting ED Encounter Risk Consideration of hospitalization  Elmer Sow. Pilar Plate, MD [facility] Emergency Medicine [facility] [email or web address]  Final Clinical Impressions(s) / ED Diagnoses     ICD-10-CM   1. Chest pain, unspecified type  R07.9     2. Elevated troponin  R79.89       ED Discharge Orders     None        Discharge Instructions Discussed with and Provided to Patient:   Discharge Instructions   None      Sabas Sous, MD 10/13/23 2325

## 2023-10-14 ENCOUNTER — Observation Stay (HOSPITAL_COMMUNITY): Payer: Medicaid Other

## 2023-10-14 ENCOUNTER — Encounter (HOSPITAL_COMMUNITY): Payer: Self-pay | Admitting: Cardiology

## 2023-10-14 ENCOUNTER — Encounter (HOSPITAL_COMMUNITY)
Admission: EM | Disposition: A | Discharge status: Home / Self Care | Payer: Self-pay | Source: Home / Self Care | Attending: Emergency Medicine

## 2023-10-14 DIAGNOSIS — I214 Non-ST elevation (NSTEMI) myocardial infarction: Secondary | ICD-10-CM

## 2023-10-14 DIAGNOSIS — E119 Type 2 diabetes mellitus without complications: Secondary | ICD-10-CM

## 2023-10-14 DIAGNOSIS — I2510 Atherosclerotic heart disease of native coronary artery without angina pectoris: Secondary | ICD-10-CM | POA: Diagnosis not present

## 2023-10-14 DIAGNOSIS — R079 Chest pain, unspecified: Secondary | ICD-10-CM | POA: Diagnosis present

## 2023-10-14 HISTORY — PX: LEFT HEART CATH AND CORONARY ANGIOGRAPHY: CATH118249

## 2023-10-14 LAB — HEMOGLOBIN A1C
Hgb A1c MFr Bld: 7 % — ABNORMAL HIGH (ref 4.8–5.6)
Mean Plasma Glucose: 154.2 mg/dL

## 2023-10-14 LAB — LIPID PANEL
Cholesterol: 149 mg/dL (ref 0–200)
HDL: 42 mg/dL (ref >40)
LDL Cholesterol: 32 mg/dL (ref 0–99)
Total CHOL/HDL Ratio: 3.5 {ratio}
Triglycerides: 374 mg/dL — ABNORMAL HIGH (ref <150)
VLDL: 75 mg/dL — ABNORMAL HIGH (ref 0–40)

## 2023-10-14 LAB — CBC
HCT: 41 % (ref 36.0–46.0)
Hemoglobin: 13.2 g/dL (ref 12.0–15.0)
MCH: 27.3 pg (ref 26.0–34.0)
MCHC: 32.2 g/dL (ref 30.0–36.0)
MCV: 84.7 fL (ref 80.0–100.0)
Platelets: 298 10*3/uL (ref 150–400)
RBC: 4.84 MIL/uL (ref 3.87–5.11)
RDW: 15.6 % — ABNORMAL HIGH (ref 11.5–15.5)
WBC: 6.4 10*3/uL (ref 4.0–10.5)
nRBC: 0 % (ref 0.0–0.2)

## 2023-10-14 LAB — VAS US ABI WITH/WO TBI
Left ABI: 1.06
Right ABI: 1.13

## 2023-10-14 LAB — HEPATIC FUNCTION PANEL
ALT: 18 U/L (ref 0–44)
AST: 23 U/L (ref 15–41)
Albumin: 3.9 g/dL (ref 3.5–5.0)
Alkaline Phosphatase: 82 U/L (ref 38–126)
Bilirubin, Direct: 0.2 mg/dL (ref 0.0–0.2)
Indirect Bilirubin: 0.5 mg/dL (ref 0.3–0.9)
Total Bilirubin: 0.7 mg/dL (ref 0.0–1.2)
Total Protein: 6.8 g/dL (ref 6.5–8.1)

## 2023-10-14 LAB — PHOSPHORUS: Phosphorus: 3.3 mg/dL (ref 2.5–4.6)

## 2023-10-14 LAB — TSH: TSH: 1.357 u[IU]/mL (ref 0.350–4.500)

## 2023-10-14 LAB — BASIC METABOLIC PANEL
Anion gap: 9 (ref 5–15)
BUN: 13 mg/dL (ref 8–23)
CO2: 23 mmol/L (ref 22–32)
Calcium: 9.5 mg/dL (ref 8.9–10.3)
Chloride: 107 mmol/L (ref 98–111)
Creatinine, Ser: 0.68 mg/dL (ref 0.44–1.00)
GFR, Estimated: >60 mL/min (ref >60)
Glucose, Bld: 119 mg/dL — ABNORMAL HIGH (ref 70–99)
Potassium: 4.1 mmol/L (ref 3.5–5.1)
Sodium: 139 mmol/L (ref 135–145)

## 2023-10-14 LAB — MAGNESIUM
Magnesium: 2.3 mg/dL (ref 1.7–2.4)
Magnesium: 2.2 mg/dL (ref 1.7–2.4)

## 2023-10-14 LAB — HEPARIN LEVEL (UNFRACTIONATED): Heparin Unfractionated: 0.25 [IU]/mL — ABNORMAL LOW (ref 0.30–0.70)

## 2023-10-14 LAB — HIV ANTIBODY (ROUTINE TESTING W REFLEX): HIV Screen 4th Generation wRfx: NONREACTIVE

## 2023-10-14 LAB — BRAIN NATRIURETIC PEPTIDE: B Natriuretic Peptide: 32.7 pg/mL (ref 0.0–100.0)

## 2023-10-14 SURGERY — LEFT HEART CATH AND CORONARY ANGIOGRAPHY
Anesthesia: LOCAL

## 2023-10-14 MED ORDER — VERAPAMIL HCL 2.5 MG/ML IV SOLN
INTRAVENOUS | Status: DC | PRN
  Administered 2023-10-14: 10 mL via INTRA_ARTERIAL

## 2023-10-14 MED ORDER — HEPARIN BOLUS VIA INFUSION
4000.0000 [IU] | Freq: Once | INTRAVENOUS | Status: AC
  Administered 2023-10-14: 4000 [IU] via INTRAVENOUS
  Filled 2023-10-14: qty 4000

## 2023-10-14 MED ORDER — HEPARIN SODIUM (PORCINE) 1000 UNIT/ML IJ SOLN
INTRAMUSCULAR | Status: DC | PRN
  Administered 2023-10-14: 4000 [IU] via INTRAVENOUS

## 2023-10-14 MED ORDER — ATORVASTATIN CALCIUM 80 MG PO TABS
80.0000 mg | ORAL_TABLET | Freq: Every day | ORAL | Status: DC
  Administered 2023-10-14: 40 mg via ORAL
  Filled 2023-10-14: qty 1

## 2023-10-14 MED ORDER — HEPARIN (PORCINE) 25000 UT/250ML-% IV SOLN
1050.0000 [IU]/h | INTRAVENOUS | Status: DC
  Administered 2023-10-14: 900 [IU]/h via INTRAVENOUS
  Filled 2023-10-14: qty 250

## 2023-10-14 MED ORDER — LOSARTAN POTASSIUM 25 MG PO TABS
25.0000 mg | ORAL_TABLET | Freq: Every day | ORAL | Status: DC
  Administered 2023-10-14 – 2023-10-15 (×2): 25 mg via ORAL
  Filled 2023-10-14 (×2): qty 1

## 2023-10-14 MED ORDER — VERAPAMIL HCL 2.5 MG/ML IV SOLN
INTRAVENOUS | Status: AC
  Filled 2023-10-14: qty 2

## 2023-10-14 MED ORDER — MELATONIN 5 MG PO TABS: 5.0000 mg | ORAL_TABLET | Freq: Every evening | ORAL | Status: DC | PRN

## 2023-10-14 MED ORDER — ASPIRIN 81 MG PO CHEW: 81.0000 mg | CHEWABLE_TABLET | ORAL | Status: DC

## 2023-10-14 MED ORDER — POLYETHYLENE GLYCOL 3350 17 G PO PACK: 17.0000 g | PACK | Freq: Every day | ORAL | Status: DC | PRN

## 2023-10-14 MED ORDER — METOPROLOL TARTRATE 12.5 MG HALF TABLET
12.5000 mg | ORAL_TABLET | Freq: Two times a day (BID) | ORAL | Status: DC
  Administered 2023-10-14 – 2023-10-15 (×3): 12.5 mg via ORAL
  Filled 2023-10-14 (×3): qty 1

## 2023-10-14 MED ORDER — LIDOCAINE HCL (PF) 1 % IJ SOLN
INTRAMUSCULAR | Status: DC | PRN
  Administered 2023-10-14: 2 mL

## 2023-10-14 MED ORDER — POTASSIUM CHLORIDE CRYS ER 20 MEQ PO TBCR
40.0000 meq | EXTENDED_RELEASE_TABLET | Freq: Once | ORAL | Status: AC
  Administered 2023-10-14: 40 meq via ORAL
  Filled 2023-10-14: qty 2

## 2023-10-14 MED ORDER — SODIUM CHLORIDE 0.9% FLUSH: 3.0000 mL | INTRAVENOUS | Status: DC | PRN

## 2023-10-14 MED ORDER — HEPARIN SODIUM (PORCINE) 1000 UNIT/ML IJ SOLN
INTRAMUSCULAR | Status: AC
  Filled 2023-10-14: qty 10

## 2023-10-14 MED ORDER — MIDAZOLAM HCL 2 MG/2ML IJ SOLN
INTRAMUSCULAR | Status: DC | PRN
  Administered 2023-10-14: 2 mg via INTRAVENOUS

## 2023-10-14 MED ORDER — LIDOCAINE HCL (PF) 1 % IJ SOLN
INTRAMUSCULAR | Status: AC
  Filled 2023-10-14: qty 30

## 2023-10-14 MED ORDER — ENOXAPARIN SODIUM 40 MG/0.4ML IJ SOSY
40.0000 mg | PREFILLED_SYRINGE | INTRAMUSCULAR | Status: DC
Start: 2023-10-15 — End: 2023-10-15
  Filled 2023-10-14: qty 0.4

## 2023-10-14 MED ORDER — MIDAZOLAM HCL 2 MG/2ML IJ SOLN
INTRAMUSCULAR | Status: AC
  Filled 2023-10-14: qty 2

## 2023-10-14 MED ORDER — HEPARIN (PORCINE) IN NACL 1000-0.9 UT/500ML-% IV SOLN
INTRAVENOUS | Status: DC | PRN
  Administered 2023-10-14 (×2): 500 mL

## 2023-10-14 MED ORDER — PROCHLORPERAZINE EDISYLATE 10 MG/2ML IJ SOLN: 5.0000 mg | Freq: Four times a day (QID) | INTRAMUSCULAR | Status: DC | PRN

## 2023-10-14 MED ORDER — ASPIRIN 81 MG PO CHEW
81.0000 mg | CHEWABLE_TABLET | ORAL | Status: AC
  Administered 2023-10-14: 81 mg via ORAL
  Filled 2023-10-14: qty 1

## 2023-10-14 MED ORDER — IOHEXOL 350 MG/ML SOLN
INTRAVENOUS | Status: DC | PRN
  Administered 2023-10-14: 40 mL via INTRA_ARTERIAL

## 2023-10-14 MED ORDER — FENTANYL CITRATE (PF) 100 MCG/2ML IJ SOLN
INTRAMUSCULAR | Status: DC | PRN
  Administered 2023-10-14: 25 ug via INTRAVENOUS

## 2023-10-14 MED ORDER — FENTANYL CITRATE (PF) 100 MCG/2ML IJ SOLN
INTRAMUSCULAR | Status: AC
  Filled 2023-10-14: qty 2

## 2023-10-14 MED ORDER — CLOPIDOGREL BISULFATE 75 MG PO TABS
75.0000 mg | ORAL_TABLET | Freq: Every day | ORAL | Status: DC
  Administered 2023-10-14 – 2023-10-15 (×2): 75 mg via ORAL
  Filled 2023-10-14 (×3): qty 1

## 2023-10-14 MED ORDER — SODIUM CHLORIDE 0.9 % WEIGHT BASED INFUSION
3.0000 mL/kg/h | INTRAVENOUS | Status: DC
Start: 2023-10-14 — End: 2023-10-14
  Administered 2023-10-14: 3 mL/kg/h via INTRAVENOUS

## 2023-10-14 MED ORDER — SODIUM CHLORIDE 0.9% FLUSH
3.0000 mL | Freq: Two times a day (BID) | INTRAVENOUS | Status: DC
  Administered 2023-10-14 – 2023-10-15 (×2): 3 mL via INTRAVENOUS

## 2023-10-14 MED ORDER — POTASSIUM CHLORIDE CRYS ER 20 MEQ PO TBCR: 40.0000 meq | EXTENDED_RELEASE_TABLET | Freq: Two times a day (BID) | ORAL | Status: DC

## 2023-10-14 MED ORDER — SODIUM CHLORIDE 0.9 % IV SOLN: 250.0000 mL | INTRAVENOUS | Status: DC | PRN

## 2023-10-14 MED ORDER — ENOXAPARIN SODIUM 40 MG/0.4ML IJ SOSY: 40.0000 mg | PREFILLED_SYRINGE | Freq: Every day | INTRAMUSCULAR | Status: DC

## 2023-10-14 MED ORDER — SODIUM CHLORIDE 0.9 % WEIGHT BASED INFUSION
1.0000 mL/kg/h | INTRAVENOUS | Status: DC
Start: 2023-10-14 — End: 2023-10-14

## 2023-10-14 MED ORDER — HYDRALAZINE HCL 20 MG/ML IJ SOLN: 10.0000 mg | INTRAMUSCULAR | Status: AC | PRN

## 2023-10-14 MED ORDER — PANTOPRAZOLE SODIUM 40 MG PO TBEC
40.0000 mg | DELAYED_RELEASE_TABLET | Freq: Every day | ORAL | Status: DC
  Administered 2023-10-14 – 2023-10-15 (×2): 40 mg via ORAL
  Filled 2023-10-14 (×2): qty 1

## 2023-10-14 MED ORDER — ACETAMINOPHEN 325 MG PO TABS: 650.0000 mg | ORAL_TABLET | Freq: Four times a day (QID) | ORAL | Status: DC | PRN

## 2023-10-14 SURGICAL SUPPLY — 8 items
CATH INFINITI 5 FR JL3.5 (CATHETERS) ×1
CATH INFINITI JR4 5F (CATHETERS) ×1
DEVICE RAD COMP TR BAND LRG (VASCULAR PRODUCTS) ×1
GLIDESHEATH SLEND SS 6F .021 (SHEATH) ×1
INQWIRE 1.5J .035X260CM (WIRE) ×1
PACK CARDIAC CATHETERIZATION (CUSTOM PROCEDURE TRAY) ×1
SET ATX-X65L (MISCELLANEOUS) ×1
SHEATH PROBE COVER 6X72 (BAG) ×1

## 2023-10-14 NOTE — Plan of Care (Signed)
FMTS Interim Progress Note  S:Evaluated pt at bedside with Dr. Laroy Apple. Pt states her heart cath went well. No pain or shortness of breath at this time.   O: BP 139/83 (BP Location: Left Arm)   Pulse 68   Temp 98.1 F (36.7 C) (Oral)   Resp 17   Ht 5\' 6"  (1.676 m)   Wt 73.7 kg   SpO2 100%   BMI 26.22 kg/m   CV: 2+ radial pulses BUE, mild swelling RUE, no discoloration, normal sensation  A/P:  LHC:   1st Diag lesion is 50% stenosed.   The left ventricular systolic function is normal.   LV end diastolic pressure is normal.   The left ventricular ejection fraction is 50-55% by visual estimate.   Mild nonobstructive CAD Overall good LV function. The apical segments are mildly hypokinetic Normal LVEDP  Echo pending  Tyvon Eggenberger, DO 10/14/2023, 6:14 PM PGY-1, [facility] Service pager [phone]

## 2023-10-14 NOTE — Progress Notes (Addendum)
Patient Name: Mariah [name] Date of Encounter: 10/14/2023 [facility] Cardiologist: None   Interval Summary  .    No chest pain this morning, very mild episode last evening.   Vital Signs .    Vitals:   10/14/23 0500 10/14/23 0530 10/14/23 0630 10/14/23 0700  BP: (!) 153/96 (!) 139/127 121/63 121/70  Pulse: 86 81 63   Resp: 16 17 16    Temp:  98.2 F (36.8 C)  97.6 F (36.4 C)  TempSrc:  Oral  Oral  SpO2: 100% 100% 99%   Weight:       No intake or output data in the 24 hours ending 10/14/23 0808    10/13/2023   12:25 PM 09/22/2023    9:42 AM 08/19/2023    9:05 AM  Last 3 Weights  Weight (lbs) 162 lb 7.7 oz 162 lb 9.6 oz 194 lb 9.6 oz  Weight (kg) 73.7 kg 73.755 kg 88.27 kg      Telemetry/ECG    Sinus rhythm, PVCs- Personally Reviewed  Physical Exam .    GEN: No acute distress.   Neck: No JVD Cardiac: RRR, no murmurs, rubs, or gallops.  Respiratory: Clear to auscultation bilaterally. GI: Soft, nontender, non-distended  MS: No edema  Assessment & Plan .     63 y.o. female with a hx of hypertension,  type II diabetes, cerebral vascular accident, peripheral arterial disease status post bare metal stent in the left superior femoral artery in 2023 who was seen 10/13/2023 for the evaluation of chest pain at the request of the Emergency Department.   Chest pain Elevated troponin -- Reports having had an episode yesterday around 11/12pm that was left-sided, heavy/sharp in nature though after eating a meal.  Sensitive troponin 48>>48.  EKG showed sinus arrhythmia, no acute ST/T wave abnormalities.  Did have brief episode of fleeting chest discomfort last evening. -- Remains on IV heparin, ASA, statin  -- Does have risk factors for coronary disease with hypertension, hyperlipidemia, PAD and diabetes -- Will need further cardiac evaluation, discussed coronary CTA versus cardiac cath with patient.  Will discuss with MD. -- Echo pending  Hypertension -- Blood  pressure better controlled this morning -- Continue losartan 25 mg daily, add low-dose metoprolol 12.5mg  BID  Hyperlipidemia -- LDL 32, HDL 42 -- On atorvastatin 80 mg daily  Diabetes -- Hgb A1c 7 -- Denies being on any medications PTA, management per primary  PVCs -- K 3.4 on admission -- as above, add metoprolol 12.5 mg twice daily  Hx of CVA PAD status post bare-metal stent to L femoral artery Marijuana use  For questions or updates, please contact [facility] Please consult [email or web address] for contact info under        Signed, Laverda Page, NP   I have personally seen and examined this patient. I agree with the assessment and plan as outlined above.  63 yo female with history of obesity, DM, HTN, HLD, PAD and prior CVA who is admitted with chest pain. No prior cardiac history. I have done an extensive review of the medical record today.  No chest pain this am but she did have chest pain last night.  Troponin 48 and flat at 48 on repeat.  EKG personally reviewed by me and shows sinus with non-specific T wave abnormalty My exam: NAD, CV:RRR Lungs: clear Ext: no edema Plan: Chest pain with mild troponin elevation in an obese female with DM, HTN, HLD and PAD. I think cardiac cath  is indicated today. I have discussed this with her. NPO for cath.  I have reviewed the risks, indications, and alternatives to cardiac catheterization, possible angioplasty, and stenting with the patient. Risks include but are not limited to bleeding, infection, vascular injury, stroke, myocardial infection, arrhythmia, kidney injury, radiation-related injury in the case of prolonged fluoroscopy use, emergency cardiac surgery, and death. The patient understands the risks of serious complication is 1-2 in 1000 with diagnostic cardiac cath and 1-2% or less with angioplasty/stenting. Continue ASA, Plavix, statin and IV heparin while awaiting cath today.   Verne Carrow, MD,  [facility] 10/14/2023 8:37 AM

## 2023-10-14 NOTE — Progress Notes (Signed)
Patient arrived to holding area with a level 1 (1 cm) hematoma above TR band on R wrist. Pressure held for approximately 5 minutes by Mariana Kaufman, RN. TR band readjusted. Hematoma resolved. 10 cc in TR band at 1405.

## 2023-10-14 NOTE — Hospital Course (Addendum)
Mariah [name] is a 63 y.o.female with a history of HTN, T2DM, CVA on ASA/Plavix, PAD s/p bare metal stent in L superior femoral artery who was admitted to the [facility] Medicine Teaching Service at [facility] for chest pain. Her hospital course is detailed below:  Chest Pain  ACS Patient presented with multiple episodes of intermittent nonexertional chest pain. Initial EKG without ischemia. Troponins trended flat. BNP wnl. Heart score 4. Initial echo without wall motion abnormalities, LV with possible hypokinesis suggestive of stress cardiomyopathy. Multifocal PVCs noted on tele. LHC with mild nonobstructive CAD, mild apical hypokinesis, overall good LV function, and normal LVEDP. Per cardiology, patient started on aspirin and metoprolol, stopped Plavix. Though recommended for additional echo, patient declined and deferred to outpatient follow up.   Other chronic conditions were medically managed with home medications and formulary alternatives as necessary (GERD, HLD, HTN, T2DM).  For PAD: Stopped Plavix per cards, started ASA daily   PCP Follow-up Recommendations: Follow up A1C of 7.0, consider SGLT2, though patient prefers lifestyle interventions.  Ensure OP echocardiogram

## 2023-10-14 NOTE — Assessment & Plan Note (Addendum)
Nonexertional chest pain. Flat troponins, EKG without ischemia. Heart score 4. LDL 32. - Appreciate cardiology recommendations as below - Heparin drip - Fu echo today  - Home Lipitor - Continuous cardiac monitoring  - CBC, BMP - replete electrolytes as indicated

## 2023-10-14 NOTE — Assessment & Plan Note (Addendum)
A1c 7.0. Not on any medications.

## 2023-10-14 NOTE — ED Notes (Signed)
Patient transported to cath lab

## 2023-10-14 NOTE — ED Notes (Signed)
Heparin level, CMP and CBC collected by this RN and sent to main lab.

## 2023-10-14 NOTE — H&P (Signed)
History and Physical  Mariah [name] WNU:[id number] DOB: 1961/01/10 DOA: 10/13/2023  Referring physician: Dr. Pilar Plate, EDP  PCP: Elberta Fortis, MD  Outpatient Specialists: None Patient coming from: Home  Chief Complaint: Chest pain   HPI: Mariah [name] is a 63 y.o. female with medical history significant for degenerative disc disease and multiple neck surgeries, type 2 diabetes, hypertension, hyperlipidemia, who presents to the ER with complaints of intermittent nonexertional centrally located chest pain, pressure-like, mild to moderate in intensity, nonradiating, that started about an hour prior to arrival.  States she has been under a lot of life stressors.  Family history of heart disease in her sister, unknown if premature coronary artery disease.  The patient presented to the ER for further evaluation.  In the ER, her chest pain had improved.  High-sensitivity troponin 48, 48.  Sinus arrhythmia on 12-lead EKG with no evidence of acute ischemia.  The patient has a heart score of 4 which puts her at moderate risk for a cardiac event.    Seen by cardiology recommended admission by medicine team.  The patient will need further workup.  Started on heparin drip with the guidance of cardiology.  Admitted by [facility], hospitalist service.  ED Course: Temperature 98.  BP 153/96, pulse 86, respiratory 16, saturation 100% on room air.  Lab studies notable for serum potassium 3.4.  Serum glucose 137.  CBC unremarkable.  LDL 32.  Review of Systems: Review of systems as noted in the HPI. All other systems reviewed and are negative.   Past Medical History:  Diagnosis Date   Cramp of muscle of left upper extremity 06/24/2019   Decreased peripheral vision of left eye 08/19/2019   Degenerative disc disease, lumbar    Ear pain 11/21/2019   Elevated hemoglobin A1c 10/26/2019   Elevated hemoglobin A1c 11/23/2019   GERD (gastroesophageal reflux disease)    Hypertension    Insomnia 07/11/2010    Qualifier: Diagnosis of  By: Lelon Perla MD, [facility]     Mitral valve prolapse    Nasal congestion 04/01/2019   Numbness and tingling of right hand 01/29/2017   Pain and swelling of right knee 12/29/2019   Postprandial bloating 04/01/2019   Pre-diabetes    Prediabetes 10/24/2016   Shortness of breath 11/21/2019   TIA (transient ischemic attack)    Past Surgical History:  Procedure Laterality Date   ABDOMINAL HYSTERECTOMY     ABDOMINAL HYSTERECTOMY     BACK SURGERY     CHOLECYSTECTOMY     JOINT REPLACEMENT     KNEE SURGERY     LOWER EXTREMITY ANGIOGRAPHY Left 06/10/2022   Procedure: Lower Extremity Angiography;  Surgeon: Renford Dills, MD;  Location: [facility] INVASIVE CV LAB;  Service: Cardiovascular;  Laterality: Left;    Social History:  reports that she quit smoking about 28 years ago. Her smoking use included cigarettes. She has been exposed to tobacco smoke. She has never used smokeless tobacco. She reports current alcohol use of about 5.0 standard drinks of alcohol per week. She reports current drug use. Drug: Marijuana.   Allergies  Allergen Reactions   Lisinopril     REACTION: COUGH   Moxifloxacin     REACTION: N \\T \ V   Sulfa Antibiotics     Other reaction(s): Other (See Comments), Other (See Comments) Pt cannot remember rxn Pt cannot remember rxn    Sulfonamide Derivatives Other (See Comments)    Pt cannot remember rxn    Family History  Problem Relation Age of  Onset   Lymphoma Mother    Lung cancer Father    Pulmonary embolism Brother    Colon cancer Neg Hx    Esophageal cancer Neg Hx    Liver cancer Neg Hx    Pancreatic cancer Neg Hx    Rectal cancer Neg Hx    Stomach cancer Neg Hx       Prior to Admission medications   Medication Sig Start Date End Date Taking? Authorizing Provider  mometasone (NASONEX) 50 MCG/ACT nasal spray Place into the nose. 01/21/23 01/21/24 Yes [provider]  adapalene (DIFFERIN) 0.1 % cream Apply topically at bedtime.  Apply a thin layer. 08/19/23   Evette Georges, MD  atorvastatin (LIPITOR) 80 MG tablet TAKE 1 TABLET(80 MG) BY MOUTH DAILY 07/31/23   Elberta Fortis, MD  azelastine (ASTELIN) 0.1 % nasal spray 1 puff in each nostril Nasally Twice a day 12/16/22   Elberta Fortis, MD  clopidogrel (PLAVIX) 75 MG tablet TAKE 1 TABLET(75 MG) BY MOUTH DAILY AS DIRECTED 09/14/23   Elberta Fortis, MD  fluticasone [facility]) 50 MCG/ACT nasal spray INSTILL 2 SPRAY IN EACH NOSTRIL EVERY DAY 12/16/22   Elberta Fortis, MD  hydrochlorothiazide (HYDRODIURIL) 25 MG tablet TAKE 1 TABLET(25 MG) BY MOUTH DAILY 05/14/23   Elberta Fortis, MD  losartan (COZAAR) 25 MG tablet Take 1 tablet (25 mg total) by mouth daily. 05/14/23   Elberta Fortis, MD  omeprazole (PRILOSEC) 40 MG capsule TAKE 1 CAPSULE(40 MG) BY MOUTH DAILY 10/03/22   Ganta, Anupa, DO  sodium chloride (OCEAN) 0.65 % nasal spray Place into the nose.    [provider]    Physical Exam: BP (!) 139/57   Pulse 69   Temp 97.6 F (36.4 C) (Oral)   Resp 18   Wt 73.7 kg   SpO2 100%   BMI 26.22 kg/m   General: 63 y.o. year-old female well developed well nourished in no acute distress.  Alert and oriented x3. Cardiovascular: Regular rate and rhythm with no rubs or gallops.  No thyromegaly or JVD noted.  No lower extremity edema. 2/4 pulses in all 4 extremities. Respiratory: Clear to auscultation with no wheezes or rales. Good inspiratory effort. Abdomen: Soft nontender nondistended with normal bowel sounds x4 quadrants. Muskuloskeletal: No cyanosis, clubbing or edema noted bilaterally Neuro: CN II-XII intact, strength, sensation, reflexes Skin: No ulcerative lesions noted or rashes Psychiatry: Judgement and insight appear normal. Mood is appropriate for condition and setting          Labs on Admission:  Basic Metabolic Panel: Recent Labs  Lab 10/13/23 1230  NA 135  K 3.4*  CL 103  CO2 23  GLUCOSE 137*  BUN 18  CREATININE 0.84  CALCIUM 9.3   Liver  Function Tests: No results for input(s): "AST", "ALT", "ALKPHOS", "BILITOT", "PROT", "ALBUMIN" in the last 168 hours. No results for input(s): "LIPASE", "AMYLASE" in the last 168 hours. No results for input(s): "AMMONIA" in the last 168 hours. CBC: Recent Labs  Lab 10/13/23 1230  WBC 7.2  HGB 13.1  HCT 40.8  MCV 84.1  PLT 313   Cardiac Enzymes: No results for input(s): "CKTOTAL", "CKMB", "CKMBINDEX", "TROPONINI" in the last 168 hours.  BNP (last 3 results) No results for input(s): "BNP" in the last 8760 hours.  ProBNP (last 3 results) No results for input(s): "PROBNP" in the last 8760 hours.  CBG: No results for input(s): "GLUCAP" in the last 168 hours.  Radiological Exams on Admission: DG Chest 2 View Result Date:  10/13/2023 CLINICAL DATA:  Chest pain EXAM: CHEST - 2 VIEW COMPARISON:  04/20/2016 FINDINGS: The heart size and mediastinal contours are within normal limits. Both lungs are clear. The visualized skeletal structures are unremarkable. IMPRESSION: No active cardiopulmonary disease. Electronically Signed   By: Duanne Guess D.O.   On: 10/13/2023 13:26    EKG: I independently viewed the EKG done and my findings are as followed: Sinus rhythm rate of 81.  Nonspecific ST-T changes.  QTc 455.  Assessment/Plan Present on Admission:  Chest pain  Principal Problem:   Chest pain  Chest pain, rule out ACS High-sensitivity troponin 48, 48 No evidence of acute ischemia on twelve-lead EKG Heart score 4 Seen by cardiology Heparin drip per cardiology Follow 2D echo LDL 32, at goal Hemoglobin A1c 7.0  Peripheral artery disease on Plavix Plavix will restart at 6 PM on 10/14/2023 Defer to cardiology to hold if needed Restarted home high intensity statin Lipitor  Hypertension Restarted home losartan Continue to monitor vital signs  GERD Restarted home PPI   Time: 75 minutes.   DVT prophylaxis: Heparin drip  Code Status: Full code  Family Communication:  Husband at bedside.  Disposition Plan: Admitted to telemetry cardiac unit  Consults called: Cardiology  Admission status: Observation status.   Status is: Observation    Darlin Drop MD [facility] Pager [phone]  If 7PM-7AM, please contact night-coverage [email or web address] Password [facility]  10/14/2023, 12:07 AM

## 2023-10-14 NOTE — Progress Notes (Signed)
     Daily Progress Note Intern Pager: [phone]  Patient name: Mariah [name] Medical record number: [id number] Date of birth: 09-21-1960 Age: 63 y.o. Gender: female  Primary Care Provider: Elberta Fortis, MD Consultants: Cardiology Code Status: Full  Pt Overview and Major Events to Date:  1/29: Admitted by [facility], transferred to FMTS  Assessment and Plan:  Mariah [name] is a 63 yo female with hx of T2DM, HTN, HLD admitted for intermittent nonexertional central chest pain. Initial trop 48>48. EKG without ischemia. Heart score of 4. Cardiology on board, started on heparin.  Assessment & Plan Chest pain Nonexertional chest pain. Flat troponins, EKG without ischemia. Heart score 4. LDL 32. - Appreciate cardiology recommendations as below - Heparin drip - Fu echo today  - Home Lipitor - Continuous cardiac monitoring  - CBC, BMP - replete electrolytes as indicated T2DM (type 2 diabetes mellitus) (HCC) A1c 7.0. Not on any medications.   Chronic and Stable Issues: GERD: Cont home Prilosec 40 daily PAD: Restart home Plavix 75 daily HLD: Cont home Lipitor 80 daily  HTN: Cont home Losartan 25 daily, holding hydrochlorothiazide 25 daily - resume as indicated  FEN/GI: NPO, pending card eval for possible cath PPx: Heparin  Dispo: Home pending clinical improvement   Subjective:  Patient doing well this morning.  No chest pain or difficulty breathing.  Objective: Temp:  [97.6 F (36.4 C)-98.2 F (36.8 C)] 98.2 F (36.8 C) (01/29 0530) Pulse Rate:  [63-90] 63 (01/29 0630) Resp:  [16-18] 16 (01/29 0630) BP: (121-160)/(57-127) 121/63 (01/29 0630) SpO2:  [96 %-100 %] 99 % (01/29 0630) Weight:  [73.7 kg] 73.7 kg (01/28 1225) Physical Exam: General: Well-appearing. Resting comfortably in room. CV: Normal S1/S2. No extra heart sounds. No chest wall tenderness. Warm and well-perfused.  Pulm: Breathing comfortably on room air. CTAB. No increased WOB. Abd: Soft, non-tender,  non-distended. Skin:  Warm, dry. No LE edema.  Psych: Pleasant and appropriate.   Laboratory: Most recent CBC Lab Results  Component Value Date   WBC 7.2 10/13/2023   HGB 13.1 10/13/2023   HCT 40.8 10/13/2023   MCV 84.1 10/13/2023   PLT 313 10/13/2023   Most recent BMP    Latest Ref Rng & Units 10/13/2023   12:30 PM  BMP  Glucose 70 - 99 mg/dL 147   BUN 8 - 23 mg/dL 18   Creatinine 8.29 - 1.00 mg/dL 5.62   Sodium 130 - 865 mmol/L 135   Potassium 3.5 - 5.1 mmol/L 3.4   Chloride 98 - 111 mmol/L 103   CO2 22 - 32 mmol/L 23   Calcium 8.9 - 10.3 mg/dL 9.3    Lipid: LDL 32, Triglycerides 374 BNP 32.7 Trop: 48 > 48  Imaging/Diagnostic Tests: CXR: No active cardiopulmonary disease.  Ivery Quale, MD 10/14/2023, 7:25 AM  PGY-1, [facility] FPTS Intern pager: [phone], text pages welcome Secure chat group [facility] Teaching Service

## 2023-10-14 NOTE — Progress Notes (Signed)
R radial site prior to TR band removal: +2 radial pulse, level 0, no discoloration and normal sensation  R radial site after TR band removal: +2 radial pulse, level 0, no discoloration and normal sensation  Reverse Barbeau: B TR band removed: 1650, gauze and tegaderm applied

## 2023-10-14 NOTE — Interval H&P Note (Signed)
History and Physical Interval Note:  10/14/2023 1:21 PM  Mariah [name]  has presented today for surgery, with the diagnosis of chest pain.  The various methods of treatment have been discussed with the patient and family. After consideration of risks, benefits and other options for treatment, the patient has consented to  Procedure(s): LEFT HEART CATH AND CORONARY ANGIOGRAPHY (N/A) as a surgical intervention.  The patient's history has been reviewed, patient examined, no change in status, stable for surgery.  I have reviewed the patient's chart and labs.  Questions were answered to the patient's satisfaction.   Cath Lab Visit (complete for each Cath Lab visit)  Clinical Evaluation Leading to the Procedure:   ACS: Yes.    Non-ACS:    Anginal Classification: CCS IV  Anti-ischemic medical therapy: No Therapy  Non-Invasive Test Results: No non-invasive testing performed  Prior CABG: No previous CABG        Theron Arista [facility] 10/14/2023 1:21 PM

## 2023-10-14 NOTE — Progress Notes (Signed)
PHARMACY - ANTICOAGULATION CONSULT NOTE  Pharmacy Consult for Heparin Indication: chest pain/ACS  Allergies  Allergen Reactions   Lisinopril     REACTION: COUGH   Moxifloxacin     REACTION: N \\T \ V   Sulfa Antibiotics     Other reaction(s): Other (See Comments), Other (See Comments) Pt cannot remember rxn Pt cannot remember rxn    Sulfonamide Derivatives Other (See Comments)    Pt cannot remember rxn    Patient Measurements: Weight: 73.7 kg (162 lb 7.7 oz)   Vital Signs: Temp: 97.6 F (36.4 C) (01/28 2301) Temp Source: Oral (01/28 2301) BP: 139/57 (01/28 2301) Pulse Rate: 69 (01/28 2301)  Labs: Recent Labs    10/13/23 1230 10/13/23 1600  HGB 13.1  --   HCT 40.8  --   PLT 313  --   CREATININE 0.84  --   TROPONINIHS 48* 48*    Estimated Creatinine Clearance: 71.4 mL/min (by C-G formula based on SCr of 0.84 mg/dL).   Medical History: Past Medical History:  Diagnosis Date   Cramp of muscle of left upper extremity 06/24/2019   Decreased peripheral vision of left eye 08/19/2019   Degenerative disc disease, lumbar    Ear pain 11/21/2019   Elevated hemoglobin A1c 10/26/2019   Elevated hemoglobin A1c 11/23/2019   GERD (gastroesophageal reflux disease)    Hypertension    Insomnia 07/11/2010   Qualifier: Diagnosis of  By: Lelon Perla MD, [facility]     Mitral valve prolapse    Nasal congestion 04/01/2019   Numbness and tingling of right hand 01/29/2017   Pain and swelling of right knee 12/29/2019   Postprandial bloating 04/01/2019   Pre-diabetes    Prediabetes 10/24/2016   Shortness of breath 11/21/2019   TIA (transient ischemic attack)     Medications:  No current facility-administered medications on file prior to encounter.   Current Outpatient Medications on File Prior to Encounter  Medication Sig Dispense Refill   mometasone (NASONEX) 50 MCG/ACT nasal spray Place into the nose.     adapalene (DIFFERIN) 0.1 % cream Apply topically at bedtime. Apply a thin  layer. 45 g 0   atorvastatin (LIPITOR) 80 MG tablet TAKE 1 TABLET(80 MG) BY MOUTH DAILY 90 tablet 3   azelastine (ASTELIN) 0.1 % nasal spray 1 puff in each nostril Nasally Twice a day 30 mL 2   clopidogrel (PLAVIX) 75 MG tablet TAKE 1 TABLET(75 MG) BY MOUTH DAILY AS DIRECTED 30 tablet 1   fluticasone (FLONASE) 50 MCG/ACT nasal spray INSTILL 2 SPRAY IN EACH NOSTRIL EVERY DAY 16 g 11   hydrochlorothiazide (HYDRODIURIL) 25 MG tablet TAKE 1 TABLET(25 MG) BY MOUTH DAILY 90 tablet 3   losartan (COZAAR) 25 MG tablet Take 1 tablet (25 mg total) by mouth daily. 90 tablet 1   omeprazole (PRILOSEC) 40 MG capsule TAKE 1 CAPSULE(40 MG) BY MOUTH DAILY 90 capsule 3   sodium chloride (OCEAN) 0.65 % nasal spray Place into the nose.       Assessment: 63 y.o. female with chest pain for heparin  Goal of Therapy:  Heparin level 0.3-0.7 units/ml Monitor platelets by anticoagulation protocol: Yes   Plan:  Heparin 4000 units IV bolus, then start heparin 900 units/hr Check heparin level in 8 hours.   Eddie Candle 10/14/2023,1:07 AM

## 2023-10-14 NOTE — H&P (View-Only) (Signed)
Patient Name: Mariah [name] Date of Encounter: 10/14/2023 [facility] Cardiologist: None   Interval Summary  .    No chest pain this morning, very mild episode last evening.   Vital Signs .    Vitals:   10/14/23 0500 10/14/23 0530 10/14/23 0630 10/14/23 0700  BP: (!) 153/96 (!) 139/127 121/63 121/70  Pulse: 86 81 63   Resp: 16 17 16    Temp:  98.2 F (36.8 C)  97.6 F (36.4 C)  TempSrc:  Oral  Oral  SpO2: 100% 100% 99%   Weight:       No intake or output data in the 24 hours ending 10/14/23 0808    10/13/2023   12:25 PM 09/22/2023    9:42 AM 08/19/2023    9:05 AM  Last 3 Weights  Weight (lbs) 162 lb 7.7 oz 162 lb 9.6 oz 194 lb 9.6 oz  Weight (kg) 73.7 kg 73.755 kg 88.27 kg      Telemetry/ECG    Sinus rhythm, PVCs- Personally Reviewed  Physical Exam .    GEN: No acute distress.   Neck: No JVD Cardiac: RRR, no murmurs, rubs, or gallops.  Respiratory: Clear to auscultation bilaterally. GI: Soft, nontender, non-distended  MS: No edema  Assessment & Plan .     63 y.o. female with a hx of hypertension,  type II diabetes, cerebral vascular accident, peripheral arterial disease status post bare metal stent in the left superior femoral artery in 2023 who was seen 10/13/2023 for the evaluation of chest pain at the request of the Emergency Department.   Chest pain Elevated troponin -- Reports having had an episode yesterday around 11/12pm that was left-sided, heavy/sharp in nature though after eating a meal.  Sensitive troponin 48>>48.  EKG showed sinus arrhythmia, no acute ST/T wave abnormalities.  Did have brief episode of fleeting chest discomfort last evening. -- Remains on IV heparin, ASA, statin  -- Does have risk factors for coronary disease with hypertension, hyperlipidemia, PAD and diabetes -- Will need further cardiac evaluation, discussed coronary CTA versus cardiac cath with patient.  Will discuss with MD. -- Echo pending  Hypertension -- Blood  pressure better controlled this morning -- Continue losartan 25 mg daily, add low-dose metoprolol 12.5mg  BID  Hyperlipidemia -- LDL 32, HDL 42 -- On atorvastatin 80 mg daily  Diabetes -- Hgb A1c 7 -- Denies being on any medications PTA, management per primary  PVCs -- K 3.4 on admission -- as above, add metoprolol 12.5 mg twice daily  Hx of CVA PAD status post bare-metal stent to L femoral artery Marijuana use  For questions or updates, please contact [facility] Please consult [email or web address] for contact info under        Signed, Laverda Page, NP   I have personally seen and examined this patient. I agree with the assessment and plan as outlined above.  63 yo female with history of obesity, DM, HTN, HLD, PAD and prior CVA who is admitted with chest pain. No prior cardiac history. I have done an extensive review of the medical record today.  No chest pain this am but she did have chest pain last night.  Troponin 48 and flat at 48 on repeat.  EKG personally reviewed by me and shows sinus with non-specific T wave abnormalty My exam: NAD, CV:RRR Lungs: clear Ext: no edema Plan: Chest pain with mild troponin elevation in an obese female with DM, HTN, HLD and PAD. I think cardiac cath  is indicated today. I have discussed this with her. NPO for cath.  I have reviewed the risks, indications, and alternatives to cardiac catheterization, possible angioplasty, and stenting with the patient. Risks include but are not limited to bleeding, infection, vascular injury, stroke, myocardial infection, arrhythmia, kidney injury, radiation-related injury in the case of prolonged fluoroscopy use, emergency cardiac surgery, and death. The patient understands the risks of serious complication is 1-2 in 1000 with diagnostic cardiac cath and 1-2% or less with angioplasty/stenting. Continue ASA, Plavix, statin and IV heparin while awaiting cath today.   Verne Carrow, MD,  [facility] 10/14/2023 8:37 AM

## 2023-10-14 NOTE — Progress Notes (Signed)
PHARMACY - ANTICOAGULATION CONSULT NOTE  Pharmacy Consult for Heparin Indication: chest pain/ACS  Allergies  Allergen Reactions   Lisinopril     REACTION: COUGH   Moxifloxacin     REACTION: N \\T \ V   Sulfa Antibiotics     Other reaction(s): Other (See Comments), Other (See Comments) Pt cannot remember rxn Pt cannot remember rxn    Sulfonamide Derivatives Other (See Comments)    Pt cannot remember rxn    Patient Measurements: Weight: 73.7 kg (162 lb 7.7 oz) Heparin Dosing Weight:    Vital Signs: Temp: 97.6 F (36.4 C) (01/29 0845) Temp Source: Oral (01/29 0845) BP: 163/60 (01/29 1001) Pulse Rate: 94 (01/29 1001)  Labs: Recent Labs    10/13/23 1230 10/13/23 1600 10/14/23 0957  HGB 13.1  --  13.2  HCT 40.8  --  41.0  PLT 313  --  298  HEPARINUNFRC  --   --  0.25*  CREATININE 0.84  --  0.68  TROPONINIHS 48* 48*  --     Estimated Creatinine Clearance: 74.9 mL/min (by C-G formula based on SCr of 0.68 mg/dL).   Medical History: Past Medical History:  Diagnosis Date   Cramp of muscle of left upper extremity 06/24/2019   Decreased peripheral vision of left eye 08/19/2019   Degenerative disc disease, lumbar    Ear pain 11/21/2019   Elevated hemoglobin A1c 10/26/2019   Elevated hemoglobin A1c 11/23/2019   GERD (gastroesophageal reflux disease)    Hypertension    Insomnia 07/11/2010   Qualifier: Diagnosis of  By: Lelon Perla MD, [facility]     Mitral valve prolapse    Nasal congestion 04/01/2019   Numbness and tingling of right hand 01/29/2017   Pain and swelling of right knee 12/29/2019   Postprandial bloating 04/01/2019   Pre-diabetes    Prediabetes 10/24/2016   Shortness of breath 11/21/2019   TIA (transient ischemic attack)     Medications:  Scheduled:   atorvastatin  80 mg Oral QPC supper   clopidogrel  75 mg Oral Daily   losartan  25 mg Oral Daily   metoprolol tartrate  12.5 mg Oral BID   pantoprazole  40 mg Oral Daily   Infusions:   sodium chloride  Stopped (10/14/23 1021)   heparin 900 Units/hr (10/14/23 1610)    Assessment: 63 yo F presented to ED with chest pain and started on heparin.  Initial heparin level subtherapeutic.  No IV issues noted.  Will increase rate.  Noted plans for cardiac cath this PM.  Goal of Therapy:  Heparin level 0.3-0.7 units/ml Monitor platelets by anticoagulation protocol: Yes   Plan:  Increase heparin to 1050 units/hr Will defer follow-up level as patient scheduled for cardiac cath this PM.  Daniele Yankowski, Judie Bonus 10/14/2023,11:15 AM

## 2023-10-15 ENCOUNTER — Observation Stay (HOSPITAL_COMMUNITY): Payer: Medicaid Other

## 2023-10-15 ENCOUNTER — Telehealth: Payer: Self-pay

## 2023-10-15 ENCOUNTER — Telehealth (INDEPENDENT_AMBULATORY_CARE_PROVIDER_SITE_OTHER): Payer: Self-pay | Admitting: Vascular Surgery

## 2023-10-15 ENCOUNTER — Other Ambulatory Visit (HOSPITAL_COMMUNITY): Payer: Self-pay

## 2023-10-15 ENCOUNTER — Telehealth (HOSPITAL_COMMUNITY): Payer: Self-pay | Admitting: Pharmacy Technician

## 2023-10-15 DIAGNOSIS — I5181 Takotsubo syndrome: Secondary | ICD-10-CM | POA: Diagnosis not present

## 2023-10-15 DIAGNOSIS — R079 Chest pain, unspecified: Secondary | ICD-10-CM | POA: Diagnosis not present

## 2023-10-15 LAB — BASIC METABOLIC PANEL
Anion gap: 6 (ref 5–15)
BUN: 13 mg/dL (ref 8–23)
CO2: 23 mmol/L (ref 22–32)
Calcium: 9.1 mg/dL (ref 8.9–10.3)
Chloride: 107 mmol/L (ref 98–111)
Creatinine, Ser: 0.71 mg/dL (ref 0.44–1.00)
GFR, Estimated: >60 mL/min (ref >60)
Glucose, Bld: 129 mg/dL — ABNORMAL HIGH (ref 70–99)
Potassium: 3.9 mmol/L (ref 3.5–5.1)
Sodium: 136 mmol/L (ref 135–145)

## 2023-10-15 LAB — CBC
HCT: 37.4 % (ref 36.0–46.0)
Hemoglobin: 12.2 g/dL (ref 12.0–15.0)
MCH: 27.4 pg (ref 26.0–34.0)
MCHC: 32.6 g/dL (ref 30.0–36.0)
MCV: 84 fL (ref 80.0–100.0)
Platelets: 270 10*3/uL (ref 150–400)
RBC: 4.45 MIL/uL (ref 3.87–5.11)
RDW: 15.6 % — ABNORMAL HIGH (ref 11.5–15.5)
WBC: 6.7 10*3/uL (ref 4.0–10.5)
nRBC: 0 % (ref 0.0–0.2)

## 2023-10-15 LAB — MAGNESIUM: Magnesium: 2.1 mg/dL (ref 1.7–2.4)

## 2023-10-15 MED ORDER — ASPIRIN 81 MG PO TBEC
81.0000 mg | DELAYED_RELEASE_TABLET | Freq: Every day | ORAL | Status: DC
  Administered 2023-10-15: 81 mg via ORAL
  Filled 2023-10-15: qty 1

## 2023-10-15 MED ORDER — ASPIRIN 81 MG PO TBEC
81.0000 mg | DELAYED_RELEASE_TABLET | Freq: Every day | ORAL | 0 refills | Status: DC
  Filled 2023-10-15: qty 30, 30d supply, fill #0

## 2023-10-15 MED ORDER — METOPROLOL TARTRATE 25 MG PO TABS
12.5000 mg | ORAL_TABLET | Freq: Two times a day (BID) | ORAL | 0 refills | Status: DC
  Filled 2023-10-15: qty 30, 30d supply, fill #0

## 2023-10-15 MED ORDER — FENOFIBRATE 160 MG PO TABS
160.0000 mg | ORAL_TABLET | Freq: Every day | ORAL | Status: DC
  Filled 2023-10-15: qty 1

## 2023-10-15 MED ORDER — FENOFIBRATE 160 MG PO TABS
160.0000 mg | ORAL_TABLET | Freq: Every day | ORAL | 0 refills | Status: DC
  Filled 2023-10-15: qty 30, 30d supply, fill #0

## 2023-10-15 NOTE — Plan of Care (Signed)

## 2023-10-15 NOTE — Telephone Encounter (Signed)
Patient LVM on nurse line requesting to speak with PCP.   I called patient to get more information, however no answer.   VM left for patient to return my call to discuss further.

## 2023-10-15 NOTE — Telephone Encounter (Signed)
Left a message on patient voicemail to return call to the office

## 2023-10-15 NOTE — Progress Notes (Signed)
Patient refused to take the fenofibrate at this time.  Stated that she would start taking this tomorrow.  Educated patient on AVS, follow up appointments and medication changes.  Reminded patient that she should take things lightly with her right arm and may take the dressing off this afternoon.

## 2023-10-15 NOTE — Progress Notes (Addendum)
Patient Name: Mariah [name] Date of Encounter: 10/15/2023 [facility] Cardiologist: None   Interval Summary  .    No chest pain.  Vital Signs .    Vitals:   10/14/23 1742 10/14/23 1925 10/14/23 2305 10/15/23 0343  BP: 139/83 (!) 144/67 134/67 129/70  Pulse:  66 (!) 53 62  Resp: 17 19 16 19   Temp: 98.1 F (36.7 C) 98.6 F (37 C) 97.8 F (36.6 C) 99.1 F (37.3 C)  TempSrc: Oral Oral Oral Oral  SpO2:  100% 100% 100%  Weight:      Height:       No intake or output data in the 24 hours ending 10/15/23 0721    10/13/2023   12:25 PM 09/22/2023    9:42 AM 08/19/2023    9:05 AM  Last 3 Weights  Weight (lbs) 162 lb 7.7 oz 162 lb 9.6 oz 194 lb 9.6 oz  Weight (kg) 73.7 kg 73.755 kg 88.27 kg      Telemetry/ECG    Sinus Rhythm, few PVCs - Personally Reviewed  Physical Exam .   GEN: No acute distress.   Neck: No JVD Cardiac: RRR, no murmurs, rubs, or gallops.  Respiratory: Clear to auscultation bilaterally. GI: Soft, nontender, non-distended  MS: No edema Skin: right radial cath site stable   Assessment & Plan .     63 y.o. female with a hx of hypertension,  type II diabetes, cerebral vascular accident, peripheral arterial disease status post bare metal stent in the left superior femoral artery in 2023 who was seen 10/13/2023 for the evaluation of chest pain at the request of the Emergency Department.    Chest pain Mildly elevated troponin -- Reports having had an episode yesterday around 11/12pm that was left-sided, heavy/sharp in nature though after eating a meal.  Sensitive troponin 48>>48.  EKG showed sinus arrhythmia, no acute ST/T wave abnormalities.  Underwent cardiac with 1st diag 50%, normal LVEDP, apical hypokinesis, recommendations for medical therapy. Question stress cardiomyopathy? -- continue ASA, (stop plavix), statin, BB  -- Echo pending, but patient reports she does not want to have this done inpatient. Did attempt to explain rationale     Hypertension -- controlled -- Continue losartan 25 mg daily, low-dose metoprolol 12.5mg  BID   Hyperlipidemia -- LDL 32, HDL 42, Trig 374 -- On atorvastatin 80 mg daily, add fenofibrate 160mg  daily    Diabetes -- Hgb A1c 7 -- Denies being on any medications PTA, management per primary   PVCs -- improved -- continue metoprolol 12.5 mg twice daily   Will arrange for outpatient follow up in the office  For questions or updates, please contact [facility] Please consult [email or web address] for contact info under        Signed, Laverda Page, NP   I have personally seen and examined this patient. I agree with the assessment and plan as outlined above.  She was admitted with chest pain and mild troponin elevation. Cardiac cath with mild CAD. LV apical hypokinesis suggestive of stress induced cardiomyopathy. She is on an ARB and a beta blocker.  No complaints this am. No chest pain or dyspnea My exam: NAD, CV:RRR   Lungs: clear bilaterally  Abd: soft, NT   Ext: no LE edema All labs reviewed.  Cath films reviewed by me Telemetry reviewed by me: sinus  Plan: I think her presentation is most consistent with a stress induced cardiomyopathy. Cath with mild CAD. Her LV gram has apical hypokinesis. I  have ordered an echo to assess her LV function and wall motion abnormalities more accurately but she is refusing the echo today. I have reviewed my recommendation that we complete the echo here today but she does not wish to do this. She may let us do an echo as an outpatient.  Continue ASA, Lopressor, Cozaar and Lipitor.   OK to d/c home today. We will arrange follow up in our office.   Verne Carrow, MD, [facility] 10/15/2023 9:27 AM

## 2023-10-15 NOTE — Telephone Encounter (Signed)
Pharmacy Patient Advocate Encounter  Received notification from [facility] Medicaid that Prior Authorization for Fenofibrate 160MG  tablets  has been DENIED.  Full denial letter will be uploaded to the media tab. See denial reason below. Per the health plan's preferred drug list, at least 2 preferred drugs must be tried before requesting this drug or tell us why the member cannot try any preferred alternatives. Please send [place] supporting chart notes and lab results. Here is list of preferred alternatives: fenofibrate tablet (generic for Tricor), gemfibrozil tablet (generic for Lopid), icosapent ethyl capsule (generic for Vascepa), omega-3 acid ethyl esters capsule (generic for Lovaza)   PA #/Case ID/Reference #: [id number]

## 2023-10-15 NOTE — Assessment & Plan Note (Addendum)
Nonexertional chest pain. Flat troponins, EKG without ischemia. Heart score 4. LDL 32. LHC with mild nonobstructive CAD, mild apical hypokinesis, overall good LV function, and normal LVEDP.  - Appreciate cardiology recommendations  - Though recommended and scheduled while inpatient, patient declines echocardiogram at this time and defers to outpatient. Explained reasoning for exam and encouraged completion of Echo. Patient elected for patient-directed discharge today prior to echocardiogram.  - Per cardiology, start ASA 81 daily and metoprolol 12.5 BID - Continue home Lipitor - Stop home Plavix  - Continuous cardiac monitoring

## 2023-10-15 NOTE — Assessment & Plan Note (Addendum)
A1c 7.0. Not on any medications. Follow up outpatient.

## 2023-10-15 NOTE — Telephone Encounter (Signed)
Patient called back and left a VM. I left her a VM because she didn't answer.

## 2023-10-15 NOTE — Progress Notes (Addendum)
     Daily Progress Note Intern Pager: [phone]  Patient name: Mariah [name] Medical record number: [id number] Date of birth: 07/19/1961 Age: 63 y.o. Gender: female  Primary Care Provider: Elberta Fortis, MD Consultants: Cardiology Code Status: Full   Pt Overview and Major Events to Date:  1/29: Admitted by [facility], transferred to FMTS 1/29: LHC   Assessment and Plan:   Mariah [name] is a 63 yo female with hx of T2DM, HTN, HLD admitted for intermittent nonexertional central chest pain. Initial trop 48>48. EKG without ischemia. Heart score of 4. LHC with cardiology showed mild nonobstructive CAD.  Assessment & Plan Chest pain Nonexertional chest pain. Flat troponins, EKG without ischemia. Heart score 4. LDL 32. LHC with mild nonobstructive CAD, mild apical hypokinesis, overall good LV function, and normal LVEDP.  - Appreciate cardiology recommendations  - Though recommended and scheduled while inpatient, patient declines echocardiogram at this time and defers to outpatient. Explained reasoning for exam and encouraged completion of Echo. Patient elected for patient-directed discharge today prior to echocardiogram.  - Per cardiology, start ASA 81 daily and metoprolol 12.5 BID - Continue home Lipitor - Stop home Plavix  - Continuous cardiac monitoring  T2DM (type 2 diabetes mellitus) (HCC) A1c 7.0. Not on any medications. Follow up outpatient.    Chronic and Stable Issues: GERD: Cont home Prilosec 40 daily PAD: Stop home Plavix 75 per cardiology, will begin daily ASA HLD: Cont home Lipitor 80 daily, add fenofibrate 160 daily per cards HTN: Cont home Losartan 25 daily, start metoprolol 12.5 BID per cards, holding hctz 25 daily  FEN/GI: Heart diet  PPx: Lovenox  Dispo: Hopeful discharge home today   Subjective:  Patient doing well this morning. No difficulty breathing or chest pain. Ambulating without issue. Does not want to complete the echocardiogram today, prefers to be  seen in clinic first.   Objective: Temp:  [97.6 F (36.4 C)-99.1 F (37.3 C)] 99.1 F (37.3 C) (01/30 0343) Pulse Rate:  [53-94] 62 (01/30 0343) Resp:  [11-19] 19 (01/30 0343) BP: (106-173)/(60-108) 129/70 (01/30 0343) SpO2:  [96 %-100 %] 100 % (01/30 0343) Physical Exam: General: Well-appearing. Resting comfortably in room. CV: Normal S1/S2. No extra heart sounds. Warm and well-perfused. Pulm: Breathing comfortably on room air. CTAB. No increased WOB. Abd: Soft, non-tender, non-distended. Skin/Ext:  Warm, dry. No LE edema.   Laboratory: Most recent CBC Lab Results  Component Value Date   WBC 6.7 10/15/2023   HGB 12.2 10/15/2023   HCT 37.4 10/15/2023   MCV 84.0 10/15/2023   PLT 270 10/15/2023   Most recent BMP    Latest Ref Rng & Units 10/15/2023    3:42 AM  BMP  Glucose 70 - 99 mg/dL 962   BUN 8 - 23 mg/dL 13   Creatinine 9.52 - 1.00 mg/dL 8.41   Sodium 324 - 401 mmol/L 136   Potassium 3.5 - 5.1 mmol/L 3.9   Chloride 98 - 111 mmol/L 107   CO2 22 - 32 mmol/L 23   Calcium 8.9 - 10.3 mg/dL 9.1    LHC 0/27: Mild nonobstructive CAD. Overall good LV function. The apical segments are mildly hypokinetic. Normal LVEDP.  Mariah Quale, MD 10/15/2023, 8:00 AM  PGY-1, [facility] FPTS Intern pager: [phone], text pages welcome Secure chat group [facility] Teaching Service

## 2023-10-15 NOTE — Telephone Encounter (Signed)
Patient calls nurse line regarding concerns with medications post hospitalization.   Spent 15 minutes discussing medications with patient. There seems to be quite a bit of confusion on which medications she should be taking.   She states that no one discussed medication changes with her and she has several questions.   Scheduled patient with Dr. Barb Merino tomorrow for hospital follow up in which she will bring all her medications for med rec.    Veronda Prude, RN

## 2023-10-15 NOTE — TOC CM/SW Note (Signed)
Transition of Care [facility]) - Inpatient Brief Assessment   Patient Details  Name: Mariah [name] MRN: [id number] Date of Birth: 11/03/1960  Transition of Care [facility]) CM/SW Contact:    Gala Lewandowsky, RN Phone Number: 10/15/2023, 11:50 AM   Clinical Narrative: Patient presented for chest pain. Patient has insurance and PCP. No home needs identified at this time. Patient states she has transportation home. No further needs identified at this time.   Transition of Care Asessment: Insurance and Status: Insurance coverage has been reviewed Patient has primary care physician: Yes Home environment has been reviewed: reviewed Prior level of function:: independent Prior/Current Home Services: No current home services Social Drivers of Health Review: SDOH reviewed no interventions necessary Readmission risk has been reviewed: Yes Transition of care needs: no transition of care needs at this time

## 2023-10-15 NOTE — Discharge Instructions (Addendum)
Dear [name],   Thank you so much for allowing [place] to be part of your care!  You were admitted to [facility] for intermittent chest pain. You underwent a catheter procedure with the Cardiology team to look into your heart health and this was reassuring overall. It is recommended that you get an ultrasound of your heart.    POST-HOSPITAL & CARE INSTRUCTIONS Please let PCP/Specialists know of any changes that were made.  Please see medications section of this packet for any medication changes.   DOCTOR'S APPOINTMENT & FOLLOW UP CARE INSTRUCTIONS  Future Appointments  Date Time Provider Department Center  10/26/2023  1:30 PM Elberta Fortis, MD [facility]  11/06/2023 11:45 AM Wyn Quaker, Marlow Baars, MD [facility] None  11/30/2023  3:00 PM Waymon Budge, MD [facility] None      RETURN PRECAUTIONS: Lambert Mody chest pain - Difficulty breathing  Take care and be well!  Family Medicine Teaching Service  [place]  [facility]  [address] [place], [place] [address] [phone]

## 2023-10-15 NOTE — Telephone Encounter (Signed)
Pharmacy Patient Advocate Encounter   Received notification  that prior authorization for Fenofibrate 160MG  tablets is required/requested.   Insurance verification completed.   The patient is insured through [facility] [place] .   Per test claim: PA required; PA submitted to above mentioned insurance via CoverMyMeds Key/confirmation #/EOC [id number] Status is pending

## 2023-10-15 NOTE — Telephone Encounter (Signed)
New Message    The patient requested a call back from the CMA to inquire about the medication prescribed by Dr. Gilda Crease.      She also asked if I could check her medication list to see who prescribed the medications. I informed her that Dr. Marijean Heath name is not listed in the current medication list.    The patient is  aware that the CMA is in the clinic at the moment and will return the call to discuss this matter.

## 2023-10-15 NOTE — Discharge Summary (Addendum)
Family Medicine Teaching [facility] Discharge Summary  Patient name: Mariah [name] Medical record number: [id number] Date of birth: [birth year] Age: 63 y.o. Gender: female Date of Admission: 10/13/2023  Date of Discharge: 10/15/2023  Admitting Physician: Darlin Drop, DO  Primary Care Provider: Elberta Fortis, MD Consultants: Cardiology  Indication for Hospitalization: Chest pain  Discharge Diagnoses/Problem List:  Principal Problem for Admission: Chest pain  Other Problems addressed during stay:  Principal Problem:   Chest pain Active Problems:   T2DM (type 2 diabetes mellitus) (HCC)   NSTEMI (non-ST elevated myocardial infarction) [facility])  Brief Hospital Course:  Mariah [name] is a 63 y.o.female with a history of HTN, T2DM, CVA on ASA/Plavix, PAD s/p bare metal stent in L superior femoral artery who was admitted to the [facility] Medicine Teaching Service at [facility] for chest pain. Her hospital course is detailed below:  Chest Pain  ACS Patient presented with multiple episodes of intermittent nonexertional chest pain. Initial EKG without ischemia. Troponins trended flat. BNP wnl. Heart score 4. Initial POCUS by ED without wall motion abnormalities, LV with possible hypokinesis suggestive of stress cardiomyopathy. Multifocal PVCs noted on tele. Patient underwent LHC on 10/14/23 with mild nonobstructive CAD, mild apical hypokinesis, overall good LV function, and normal LVEDP.  This was suspicious for stress cardiomyopathy.   Per cardiology, patient started on aspirin and metoprolol, stopped Plavix (confirmed with vascular surgery). Though recommended for complete echocardiogram, patient declined and deferred to outpatient follow up.   Other chronic conditions were medically managed with home medications and formulary alternatives as necessary (GERD, HLD, HTN, T2DM).  For PAD: Stopped Plavix per Cardiology/Vascular, started ASA daily   PCP Follow-up Recommendations: Follow up  A1C of 7.0, consider SGLT2, though patient prefers lifestyle interventions.  Ensure OP echocardiogram  Disposition: Home   Discharge Condition: Improved and stable  Discharge Exam:  Vitals:   10/14/23 2305 10/15/23 0343  BP: 134/67 129/70  Pulse: (!) 53 62  Resp: 16 19  Temp: 97.8 F (36.6 C) 99.1 F (37.3 C)  SpO2: 100% 100%   General: Well-appearing. Resting comfortably in room. CV: Normal S1/S2. No extra heart sounds. Warm and well-perfused. Pulm: Breathing comfortably on room air. CTAB. No increased WOB. Abd: Soft, non-tender, non-distended. Skin/Ext:  Warm, dry. No LE edema.   Significant Procedures:  LHC 1/29: mild nonobstructive CAD, mild apical hypokinesis, overall good LV function, and normal LVEDP   Significant Labs and Imaging:  Recent Labs  Lab 10/14/23 0957 10/15/23 0342  WBC 6.4 6.7  HGB 13.2 12.2  HCT 41.0 37.4  PLT 298 270   Recent Labs  Lab 10/14/23 0125 10/14/23 0957 10/15/23 0342  NA  --  139 136  K  --  4.1 3.9  CL  --  107 107  CO2  --  23 23  GLUCOSE  --  119* 129*  BUN  --  13 13  CREATININE  --  0.68 0.71  CALCIUM  --  9.5 9.1  MG 2.3 2.2 2.1  PHOS  --  3.3  --   ALKPHOS 82  --   --   AST 23  --   --   ALT 18  --   --   ALBUMIN 3.9  --   --    Trop: 48 > 48 BNP: 32.7 Lipid panel: Triglycerides 374, LDL 32 A1c: 7.0 TSH: 1.357  CXR: No acute disease  Results/Tests Pending at Time of Discharge: Lipoprotein A in process  Discharge Medications:  Allergies as of 10/15/2023       Reactions   Lisinopril    REACTION: COUGH   Moxifloxacin    REACTION: N \\T \ V   Sulfa Antibiotics    Other reaction(s): Other (See Comments), Other (See Comments) Pt cannot remember rxn Pt cannot remember rxn   Sulfonamide Derivatives Other (See Comments)   Pt cannot remember rxn        Medication List     STOP taking these medications    clopidogrel 75 MG tablet Commonly known as: PLAVIX       TAKE these medications    aspirin  EC 81 MG tablet Take 1 tablet (81 mg total) by mouth daily. Swallow whole. Start taking on: October 16, 2023   atorvastatin 80 MG tablet Commonly known as: LIPITOR TAKE 1 TABLET(80 MG) BY MOUTH DAILY What changed:  how much to take how to take this when to take this   azelastine 0.1 % nasal spray Commonly known as: ASTELIN 1 puff in each nostril Nasally Twice a day   fenofibrate 160 MG tablet Take 1 tablet (160 mg total) by mouth daily.   fluticasone 50 MCG/ACT nasal spray Commonly known as: FLONASE INSTILL 2 SPRAY IN EACH NOSTRIL EVERY DAY   hydrochlorothiazide 25 MG tablet Commonly known as: HYDRODIURIL TAKE 1 TABLET(25 MG) BY MOUTH DAILY   losartan 25 MG tablet Commonly known as: COZAAR Take 1 tablet (25 mg total) by mouth daily.   metoprolol tartrate 25 MG tablet Commonly known as: LOPRESSOR Take 0.5 tablets (12.5 mg total) by mouth 2 (two) times daily.   omeprazole 40 MG capsule Commonly known as: PRILOSEC TAKE 1 CAPSULE(40 MG) BY MOUTH DAILY   sodium chloride 0.65 % nasal spray Commonly known as: OCEAN Place into the nose as needed for congestion.        Discharge Instructions: Please refer to Patient Instructions section of EMR for full details.  Patient was counseled important signs and symptoms that should prompt return to medical care, changes in medications, dietary instructions, activity restrictions, and follow up appointments.   Follow-Up Appointments:  Follow-up Information     Elberta Fortis, MD Follow up on 10/26/2023.   Specialty: Family Medicine Contact information: [address] [place] [address] [phone]                 Ivery Quale, MD 10/15/2023, 6:49 PM PGY-1, [facility]

## 2023-10-16 ENCOUNTER — Ambulatory Visit (INDEPENDENT_AMBULATORY_CARE_PROVIDER_SITE_OTHER): Payer: Medicaid Other | Admitting: Family Medicine

## 2023-10-16 VITALS — BP 136/72 | HR 68 | Wt 188.6 lb

## 2023-10-16 DIAGNOSIS — J309 Allergic rhinitis, unspecified: Secondary | ICD-10-CM

## 2023-10-16 DIAGNOSIS — I429 Cardiomyopathy, unspecified: Secondary | ICD-10-CM

## 2023-10-16 LAB — LIPOPROTEIN A (LPA): Lipoprotein (a): 51 nmol/L — ABNORMAL HIGH (ref <75.0)

## 2023-10-16 MED ORDER — AZELASTINE HCL 0.1 % NA SOLN: NASAL | 2 refills | Status: AC

## 2023-10-16 NOTE — Progress Notes (Signed)
    SUBJECTIVE:   CHIEF COMPLAINT / HPI:   Here for medication management She was recently admitted 1/28-1/30 for chest pain.  Underwent LHC on 1/29 with mild nonobstructive CAD, mild apical hypokinesis, suspicious for stress cardiomyopathy.  Started on aspirin and metoprolol.  Her Plavix was stopped.  She was recommended to get outpatient echocardiogram  Medications upon discharge included: Aspirin 81 mg daily Lipitor 80 mg daily Astelin twice daily nasal spray Fenofibrate 160 mg daily Flonase daily HCTZ 25 mg daily Losartan 25 mg daily Lopressor 12.5 mg twice daily Omeprazole 40 mg daily  Did not take any meds today  Reviewed meds which she brought with her today. Advised to DC plavix but otherwise all above meds are up to date   PERTINENT  PMH / PSH: HTN, DM diet controlled, TIA  OBJECTIVE:   BP (!) 142/74   Pulse 68   Wt 188 lb 9.6 oz (85.5 kg)   SpO2 96%   BMI 30.44 kg/m   General: NAD, pleasant, able to participate in exam Respiratory: No respiratory distress Skin: warm and dry, no rashes noted Psych: Normal affect and mood  ASSESSMENT/PLAN:   Assessment & Plan Cardiomyopathy, unspecified type [facility]) Recently admitted for this.  Ordered echocardiogram.  She has cardiology follow-up scheduled on 2/20 and PCP follow-up scheduled on 2/10. Reviewed her medication list in detail and answered all questions regarding this.  Medication list is up-to-date.  Follow-up at next visit   Vonna Drafts, MD [facility]

## 2023-10-16 NOTE — Patient Instructions (Addendum)
Your medication list is updated  I have ordered an echocardiogram.  You should receive a call about this soon to schedule this

## 2023-10-17 ENCOUNTER — Encounter: Payer: Self-pay | Admitting: Family Medicine

## 2023-10-22 ENCOUNTER — Ambulatory Visit (HOSPITAL_COMMUNITY)
Admission: RE | Admit: 2023-10-22 | Discharge: 2023-10-22 | Disposition: A | Discharge status: Home / Self Care | Payer: Medicaid Other | Source: Ambulatory Visit | Attending: Family Medicine | Admitting: Family Medicine

## 2023-10-22 DIAGNOSIS — E119 Type 2 diabetes mellitus without complications: Secondary | ICD-10-CM | POA: Diagnosis not present

## 2023-10-22 DIAGNOSIS — I429 Cardiomyopathy, unspecified: Secondary | ICD-10-CM | POA: Diagnosis not present

## 2023-10-22 DIAGNOSIS — I10 Essential (primary) hypertension: Secondary | ICD-10-CM | POA: Diagnosis not present

## 2023-10-22 DIAGNOSIS — R079 Chest pain, unspecified: Secondary | ICD-10-CM | POA: Diagnosis not present

## 2023-10-22 DIAGNOSIS — I428 Other cardiomyopathies: Secondary | ICD-10-CM | POA: Diagnosis not present

## 2023-10-22 DIAGNOSIS — Z8673 Personal history of transient ischemic attack (TIA), and cerebral infarction without residual deficits: Secondary | ICD-10-CM | POA: Insufficient documentation

## 2023-10-22 DIAGNOSIS — E785 Hyperlipidemia, unspecified: Secondary | ICD-10-CM | POA: Diagnosis not present

## 2023-10-22 DIAGNOSIS — I340 Nonrheumatic mitral (valve) insufficiency: Secondary | ICD-10-CM | POA: Diagnosis not present

## 2023-10-22 LAB — ECHOCARDIOGRAM COMPLETE
AR max vel: 1.71 cm2
AV Area VTI: 1.9 cm2
AV Area mean vel: 1.71 cm2
AV Mean grad: 5 mm[Hg]
AV Peak grad: 11.2 mm[Hg]
Ao pk vel: 1.67 m/s
Area-P 1/2: 2.42 cm2
Calc EF: 59.2 %
MV M vel: 4.91 m/s
MV Peak grad: 96.4 mm[Hg]
MV VTI: 1.48 cm2
Radius: 0.5 cm
S' Lateral: 3.6 cm
Single Plane A2C EF: 62 %
Single Plane A4C EF: 56.4 %

## 2023-10-22 NOTE — Progress Notes (Signed)
*  PRELIMINARY RESULTS* Echocardiogram 2D Echocardiogram has been performed.  Silvana Drones 10/22/2023, 10:05 AM

## 2023-10-23 ENCOUNTER — Encounter: Payer: Self-pay | Admitting: Family Medicine

## 2023-10-26 ENCOUNTER — Ambulatory Visit (INDEPENDENT_AMBULATORY_CARE_PROVIDER_SITE_OTHER): Payer: Medicaid Other | Admitting: Family Medicine

## 2023-10-26 ENCOUNTER — Encounter: Payer: Self-pay | Admitting: Family Medicine

## 2023-10-26 VITALS — BP 159/74 | HR 60 | Ht 66.0 in | Wt 189.4 lb

## 2023-10-26 DIAGNOSIS — I10 Essential (primary) hypertension: Secondary | ICD-10-CM | POA: Diagnosis not present

## 2023-10-26 DIAGNOSIS — R079 Chest pain, unspecified: Secondary | ICD-10-CM

## 2023-10-26 DIAGNOSIS — E119 Type 2 diabetes mellitus without complications: Secondary | ICD-10-CM

## 2023-10-26 MED ORDER — ATORVASTATIN CALCIUM 40 MG PO TABS: 40.0000 mg | ORAL_TABLET | Freq: Every day | ORAL | Status: DC

## 2023-10-26 NOTE — Patient Instructions (Addendum)
 It was wonderful to see you today! Thank you for choosing [facility].   Please bring ALL of your medications with you to every visit.   Today we talked about:  Please follow-up with the cardiologist as scheduled on 2/20 for further discussion. The Cardiologist in the hospital stated you have heart failure with preserved ejection fraction - Grade II diastolic dysfunction. You saw Dr. Abel Hoe and Dr. [place] from Cardiology regarding this concerns.   Please follow up for additional concerns when available  Call the clinic at [phone] if your symptoms worsen or you have any concerns.  Please be sure to schedule follow up at the front desk before you leave today.   Jonne Netters, DO Family Medicine

## 2023-10-26 NOTE — Assessment & Plan Note (Signed)
 A1c 7.0 while hospitalized, at goal A1c with diet control. -Repeat A1c in 6 months -ACR today

## 2023-10-26 NOTE — Assessment & Plan Note (Signed)
 159/74 upon repeat, taking HCTZ 25 mg daily and losartan  25 mg daily.  Has been controlled at recent office visits, likely elevated of stress from recent hospitalization.  In discussion with the patient, will defer additional medication management until she sees cardiology on 2/20.

## 2023-10-26 NOTE — Progress Notes (Signed)
    SUBJECTIVE:   CHIEF COMPLAINT / HPI:   Hospital follow-up Hospitalized at [facility] from 1/28 to 1/30 for chest pain.  Most likely secondary to stress cardiomyopathy but planning to follow-up with cardiology outpatient on 2/20  Seen at [facility] on 1/31, echo ordered and medication list updated.  PCP Follow-up Recommendations: Follow up A1C of 7.0, consider SGLT2, though patient prefers lifestyle interventions.  Ensure OP echocardiogram  Today reports she has not had any further episodes of chest pain.  Would like to avoid labeling any heart conditions and see if her cardiac function would improve with lifestyle changes.  PERTINENT  PMH / PSH: HTN, T2DM (diet-controlled), CVA  OBJECTIVE:   BP (!) 159/74   Pulse 60   Ht 5\' 6"  (1.676 m)   Wt 189 lb 6.4 oz (85.9 kg)   SpO2 100%   BMI 30.57 kg/m    General: NAD, pleasant, able to participate in exam Cardiac: RRR, no murmurs. Respiratory: CTAB, normal effort, No wheezes, rales or rhonchi Extremities: no edema or cyanosis. Skin: warm and dry, no rashes noted Neuro: alert, no obvious focal deficits Psych: Normal affect and mood  ASSESSMENT/PLAN:   Assessment & Plan Chest pain, unspecified type No further episodes of chest pain since hospital discharge.  Completed echo which showed EF 55 to 60% and G2DD which was discussed extensively with the patient.  Recommend low-salt diet, cessation of alcohol and marijuana and exercise for long-term heart health.  Patient prefers to avoid any additional medications and would like to see how her heart function trends over time. Will defer additional medication management to cardiology as she would like to discuss her diagnosis further with them before making changes. -Medications reviewed and list updated Type 2 diabetes mellitus without complication, without long-term current use of insulin  (HCC) A1c 7.0 while hospitalized, at goal A1c with diet control. -Repeat A1c in 6  months -ACR today Benign essential hypertension 159/74 upon repeat, taking HCTZ 25 mg daily and losartan  25 mg daily.  Has been controlled at recent office visits, likely elevated of stress from recent hospitalization.  In discussion with the patient, will defer additional medication management until she sees cardiology on 2/20.   Dr. Jonne Netters, DO [facility]

## 2023-10-26 NOTE — Assessment & Plan Note (Signed)
 No further episodes of chest pain since hospital discharge.  Completed echo which showed EF 55 to 60% and G2DD which was discussed extensively with the patient.  Recommend low-salt diet, cessation of alcohol and marijuana and exercise for long-term heart health.  Patient prefers to avoid any additional medications and would like to see how her heart function trends over time. Will defer additional medication management to cardiology as she would like to discuss her diagnosis further with them before making changes. -Medications reviewed and list updated

## 2023-10-27 LAB — MICROALBUMIN / CREATININE URINE RATIO
Creatinine, Urine: 64 mg/dL
Microalb/Creat Ratio: 11 mg/g{creat} (ref 0–29)
Microalbumin, Urine: 6.9 ug/mL

## 2023-10-28 ENCOUNTER — Encounter: Payer: Self-pay | Admitting: Family Medicine

## 2023-11-04 NOTE — Progress Notes (Unsigned)
Cardiology Office Note    Patient Name: Mariah [name] Date of Encounter: 11/04/2023  Primary Care Provider:  Elberta Fortis, MD Primary Cardiologist:  Verne Carrow, MD Primary Electrophysiologist: None   Past Medical History    Past Medical History:  Diagnosis Date   Cramp of muscle of left upper extremity 06/24/2019   Decreased peripheral vision of left eye 08/19/2019   Degenerative disc disease, lumbar    Ear pain 11/21/2019   Elevated hemoglobin A1c 10/26/2019   Elevated hemoglobin A1c 11/23/2019   GERD (gastroesophageal reflux disease)    Hypertension    Insomnia 07/11/2010   Qualifier: Diagnosis of  By: Lelon Perla MD, [facility]     Mitral valve prolapse    Nasal congestion 04/01/2019   Numbness and tingling of right hand 01/29/2017   Pain and swelling of right knee 12/29/2019   Postprandial bloating 04/01/2019   Pre-diabetes    Prediabetes 10/24/2016   Shortness of breath 11/21/2019   TIA (transient ischemic attack)     History of Present Illness  Mariah [name] is a 63 y.o. female with a PMH of CVA, PAD s/p BMS to left superior femoral artery, MVR, HTN, HLD, DM type II, PVCs who presents today for posthospital follow-up.  [name] was seen on 10/14/2023 with complaint of chest pain.  She described her discomfort is not exertional and intermittent and EKG showed no evidence of ischemia.  Troponins were flat and BNP was normal.  She underwent LHC on 10/14/2023 that showed mild nonobstructive disease with mild apical hypokinesis.  She was started on metoprolol and ASA 81 mg with Plavix 75 mg discontinued.  She was recommended to have TTE outpatient and was completed on 10/22/2023 showing EF of 55 to 60% with no RWMA and mild LVH with grade 2 DD (pseudonormalization) and severely dilated LA, moderate leaflet thickening of MV with MVR.  [name] presents today with her husband for posthospital follow-up.   She recently underwent a heart catheterization due to  chest pain. The catheterization showed nonobstructive disease, with plaque present in the coronary arteries but not causing obstruction. There is a 50% lesion in the first diagonal artery. The patient's echocardiogram showed normal heart pump function, but some thickness of the left ventricle of the heart, likely due to the patient's history of hypertension. The patient is currently on medications including Lipitor, fenofibrate, losartan, HCTZ, and metoprolol. The patient also has high triglycerides and is working on lifestyle changes to improve her health. Patient denies chest pain, palpitations, dyspnea, PND, orthopnea, nausea, vomiting, dizziness, syncope, edema, weight gain, or early satiety.   Review of Systems  Please see the history of present illness.    All other systems reviewed and are otherwise negative except as noted above.  Physical Exam    Wt Readings from Last 3 Encounters:  10/26/23 189 lb 6.4 oz (85.9 kg)  10/16/23 188 lb 9.6 oz (85.5 kg)  10/13/23 162 lb 7.7 oz (73.7 kg)   ZO:[id number] were no vitals filed for this visit.,There is no height or weight on file to calculate BMI. GEN: Well nourished, well developed in no acute distress Neck: No JVD; No carotid bruits Pulmonary: Clear to auscultation without rales, wheezing or rhonchi  Cardiovascular: Normal rate. Regular rhythm. Normal S1. Normal S2.   Murmurs: There is no murmur.  ABDOMEN: Soft, non-tender, non-distended EXTREMITIES:  No edema; No deformity   EKG/LABS/ Recent Cardiac Studies   ECG personally reviewed by me today -none completed today  Risk Assessment/Calculations:  Lab Results  Component Value Date   WBC 6.7 10/15/2023   HGB 12.2 10/15/2023   HCT 37.4 10/15/2023   MCV 84.0 10/15/2023   PLT 270 10/15/2023   Lab Results  Component Value Date   CREATININE 0.71 10/15/2023   BUN 13 10/15/2023   NA 136 10/15/2023   K 3.9 10/15/2023   CL 107 10/15/2023   CO2 23 10/15/2023   Lab Results   Component Value Date   CHOL 149 10/14/2023   HDL 42 10/14/2023   LDLCALC 32 10/14/2023   TRIG 374 (H) 10/14/2023   CHOLHDL 3.5 10/14/2023    Lab Results  Component Value Date   HGBA1C 7.0 (H) 10/14/2023   Assessment & Plan    1.  Nonobstructive CAD: -LHC performed on 10/14/2023 that showed mild nonobstructive disease with normal LVEDP -2D echo was completed showing normal EF of 55 to 60% with mild ventricular hypertrophy and grade 2 DD severely dilated LA and moderate leaflet thickening of the MV with mild MVR -Today patient reports no residual chest pain since left heart cath procedure. -Continue GDMT with ASA 81 mg, Lipitor 40 mg, fenofibrate 160 mg metoprolol 12.5 mg twice daily  2.  Essential hypertension: -Patient's blood pressure today was controlled at 116/88 -Continue HCTZ 25 mg, losartan 25 mg, metoprolol 12.5 mg twice daily  3.  Hyperlipidemia: -Patient's last LDL cholesterol was controlled at 32 and triglycerides were elevated at 374 -Continue fenofibrate 160 mg daily and Lipitor 40 mg daily -Refer to [employer] health coach for assistance with dietary changes. -Recheck lipid panel and liver function tests in 8 weeks.  4.  DM type II: -Patient's last hemoglobin A1c was 7 -Follow-up with PCP regarding management of diabetes  5.  Nonrheumatic MVR: -2D echo completed showing mild MVR with moderate leaflet thickening -Discussed the importance of optimal blood pressure control to prevent further valve thickening. -Continue current antihypertensive medications:losartan, HCTZ, and metoprolol. -Plan to repeat echocardiogram in 1 year to monitor for progression.  6.  Chest pain: -Today patient reports no chest pain or shortness of breath. -Continue current GDMT with Lipitor 40 mg daily, ASA 81 mg, fenofibrate 160 mg, 12.5 mg twice daily  Disposition: Follow-up with Verne Carrow, MD or APP in 3 months    Signed, Napoleon Form, Leodis Rains, NP 11/04/2023, 3:17 PM [facility]

## 2023-11-05 ENCOUNTER — Encounter: Payer: Self-pay | Admitting: *Deleted

## 2023-11-05 ENCOUNTER — Other Ambulatory Visit: Payer: Self-pay | Admitting: *Deleted

## 2023-11-05 ENCOUNTER — Encounter: Payer: Self-pay | Admitting: Nurse Practitioner

## 2023-11-05 ENCOUNTER — Ambulatory Visit: Payer: Medicaid Other | Attending: Nurse Practitioner | Admitting: Nurse Practitioner

## 2023-11-05 VITALS — BP 116/88 | HR 72 | Ht 66.0 in | Wt 188.8 lb

## 2023-11-05 DIAGNOSIS — I70229 Atherosclerosis of native arteries of extremities with rest pain, unspecified extremity: Secondary | ICD-10-CM

## 2023-11-05 DIAGNOSIS — E785 Hyperlipidemia, unspecified: Secondary | ICD-10-CM

## 2023-11-05 DIAGNOSIS — I10 Essential (primary) hypertension: Secondary | ICD-10-CM

## 2023-11-05 DIAGNOSIS — E1169 Type 2 diabetes mellitus with other specified complication: Secondary | ICD-10-CM

## 2023-11-05 DIAGNOSIS — I2510 Atherosclerotic heart disease of native coronary artery without angina pectoris: Secondary | ICD-10-CM

## 2023-11-05 DIAGNOSIS — I639 Cerebral infarction, unspecified: Secondary | ICD-10-CM

## 2023-11-05 NOTE — Patient Instructions (Signed)
Medication Instructions:  Your physician recommends that you continue on your current medications as directed. Please refer to the Current Medication list given to you today.  *If you need a refill on your cardiac medications before your next appointment, please call your pharmacy*   Lab Work: 8 WEEKS, GO TO A [facility] NEAR YOU, FASTING, FOR:  LIPID & LFT  If you have labs (blood work) drawn today and your tests are completely normal, you will receive your results only by: MyChart Message (if you have MyChart) OR A paper copy in the mail If you have any lab test that is abnormal or we need to change your treatment, we will call you to review the results.   Testing/Procedures: None ordered    Follow-Up: At [facility], you and your health needs are our priority.  As part of our continuing mission to provide you with exceptional heart care, we have created designated Provider Care Teams.  These Care Teams include your primary Cardiologist (physician) and Advanced Practice Providers (APPs -  Physician Assistants and Nurse Practitioners) who all work together to provide you with the care you need, when you need it.  We recommend signing up for the patient portal called "MyChart".  Sign up information is provided on this After Visit Summary.  MyChart is used to connect with patients for Virtual Visits (Telemedicine).  Patients are able to view lab/test results, encounter notes, upcoming appointments, etc.  Non-urgent messages can be sent to your provider as well.   To learn more about what you can do with MyChart, go to [email or web address].    Your next appointment:   3 month(s)  Provider:   Robin Searing, NP         Other Instructions      1st Floor: - Lobby - Registration  - Pharmacy  - Lab - Cafe  2nd Floor: - PV Lab - Diagnostic Testing (echo, CT, nuclear med)  3rd Floor: - Vacant  4th Floor: - TCTS (cardiothoracic surgery) - AFib Clinic - Structural  Heart Clinic - Vascular Surgery  - Vascular Ultrasound  5th Floor: - HeartCare Cardiology (general and EP) - Clinical Pharmacy for coumadin, hypertension, lipid, weight-loss medications, and med management appointments    Valet parking services will be available as well.

## 2023-11-06 ENCOUNTER — Encounter (INDEPENDENT_AMBULATORY_CARE_PROVIDER_SITE_OTHER): Payer: Self-pay | Admitting: Vascular Surgery

## 2023-11-06 ENCOUNTER — Ambulatory Visit (INDEPENDENT_AMBULATORY_CARE_PROVIDER_SITE_OTHER): Payer: Medicaid Other | Admitting: Vascular Surgery

## 2023-11-06 ENCOUNTER — Telehealth: Payer: Self-pay

## 2023-11-06 VITALS — BP 157/96 | HR 71 | Resp 18 | Ht 66.0 in | Wt 192.2 lb

## 2023-11-06 DIAGNOSIS — E1169 Type 2 diabetes mellitus with other specified complication: Secondary | ICD-10-CM | POA: Diagnosis not present

## 2023-11-06 DIAGNOSIS — I70212 Atherosclerosis of native arteries of extremities with intermittent claudication, left leg: Secondary | ICD-10-CM | POA: Diagnosis not present

## 2023-11-06 DIAGNOSIS — E782 Mixed hyperlipidemia: Secondary | ICD-10-CM

## 2023-11-06 DIAGNOSIS — Z0000 Encounter for general adult medical examination without abnormal findings: Secondary | ICD-10-CM

## 2023-11-06 DIAGNOSIS — I10 Essential (primary) hypertension: Secondary | ICD-10-CM | POA: Diagnosis not present

## 2023-11-06 NOTE — Telephone Encounter (Signed)
Called patient per health coaching referral for exercise from Robin Searing, NP. Patient is interested in participating in health coaching. Patient has been scheduled for an initial in-person health coaching appointment on 2/24 at [facility]. Patient was provided my contact number in the event she has difficulty locating the facility.   [name], MS, ERHD, [facility]  Care Guide, Health & Wellness Coach [address] [place] [address] Telephone: [phone] Email: [email or web address]

## 2023-11-06 NOTE — Assessment & Plan Note (Signed)
 blood pressure control important in reducing the progression of atherosclerotic disease. On appropriate oral medications.

## 2023-11-06 NOTE — Assessment & Plan Note (Signed)
 lipid control important in reducing the progression of atherosclerotic disease. Continue statin therapy

## 2023-11-06 NOTE — Assessment & Plan Note (Signed)
She had ABIs done a few weeks ago which showed normal multiphasic waveforms with ABIs of 1.13 on the right and 1.06 on the left.  On aspirin and statin agent.  Recheck in one year or sooner if problems develop in the interim.

## 2023-11-06 NOTE — Progress Notes (Signed)
MRN : [id number]  Mariah [name] is a 63 y.o. (03/29/1961) female who presents with chief complaint of  Chief Complaint  Patient presents with   Follow-up    fi 6 months had ABI 10/13/23  .  History of Present Illness: Patient returns today in follow up of her PAD.  About a year and a half ago, she underwent lower extremity intervention for disabling claudication symptoms.  She is doing well currently.  She does not have any lifestyle limiting claudication, ischemic rest pain, or ulceration.  She is now on aspirin and a statin agent and no longer is on Plavix.  She had ABIs done a few weeks ago which showed normal multiphasic waveforms with ABIs of greater than 1 bilaterally.  Current Outpatient Medications  Medication Sig Dispense Refill   aspirin EC 81 MG tablet Take 1 tablet (81 mg total) by mouth daily. Swallow whole. 30 tablet 0   atorvastatin (LIPITOR) 40 MG tablet Take 1 tablet (40 mg total) by mouth daily.     azelastine (ASTELIN) 0.1 % nasal spray 1 puff in each nostril Nasally Twice a day 30 mL 2   fenofibrate 160 MG tablet Take 1 tablet (160 mg total) by mouth daily. 30 tablet 0   fluticasone (FLONASE) 50 MCG/ACT nasal spray INSTILL 2 SPRAY IN EACH NOSTRIL EVERY DAY 16 g 11   hydrochlorothiazide (HYDRODIURIL) 25 MG tablet TAKE 1 TABLET(25 MG) BY MOUTH DAILY 90 tablet 3   losartan (COZAAR) 25 MG tablet Take 1 tablet (25 mg total) by mouth daily. 90 tablet 1   metoprolol tartrate (LOPRESSOR) 25 MG tablet Take 0.5 tablets (12.5 mg total) by mouth 2 (two) times daily. 30 tablet 0   omeprazole (PRILOSEC) 40 MG capsule TAKE 1 CAPSULE(40 MG) BY MOUTH DAILY 90 capsule 3   sodium chloride (OCEAN) 0.65 % nasal spray Place into the nose as needed for congestion.     No current facility-administered medications for this visit.    Past Medical History:  Diagnosis Date   Cramp of muscle of left upper extremity 06/24/2019   Decreased peripheral vision of left eye 08/19/2019    Degenerative disc disease, lumbar    Ear pain 11/21/2019   Elevated hemoglobin A1c 10/26/2019   Elevated hemoglobin A1c 11/23/2019   GERD (gastroesophageal reflux disease)    Hypertension    Insomnia 07/11/2010   Qualifier: Diagnosis of  By: Lelon Perla MD, [facility]     Mitral valve prolapse    Nasal congestion 04/01/2019   Numbness and tingling of right hand 01/29/2017   Pain and swelling of right knee 12/29/2019   Postprandial bloating 04/01/2019   Pre-diabetes    Prediabetes 10/24/2016   Shortness of breath 11/21/2019   TIA (transient ischemic attack)     Past Surgical History:  Procedure Laterality Date   ABDOMINAL HYSTERECTOMY     ABDOMINAL HYSTERECTOMY     BACK SURGERY     CHOLECYSTECTOMY     JOINT REPLACEMENT     KNEE SURGERY     LEFT HEART CATH AND CORONARY ANGIOGRAPHY N/A 10/14/2023   Procedure: LEFT HEART CATH AND CORONARY ANGIOGRAPHY;  Surgeon: [place], Peter M, MD;  Location: [facility] INVASIVE CV LAB;  Service: Cardiovascular;  Laterality: N/A;   LOWER EXTREMITY ANGIOGRAPHY Left 06/10/2022   Procedure: Lower Extremity Angiography;  Surgeon: Renford Dills, MD;  Location: [facility] INVASIVE CV LAB;  Service: Cardiovascular;  Laterality: Left;     Social History   Tobacco Use   Smoking  status: Former    Current packs/day: 0.00    Types: Cigarettes    Quit date: 09/16/1995    Years since quitting: 28.1    Passive exposure: Past   Smokeless tobacco: Never  Substance Use Topics   Alcohol use: Yes    Alcohol/week: 5.0 standard drinks of alcohol    Types: 5 Cans of beer per week   Drug use: Yes    Types: Marijuana      Family History  Problem Relation Age of Onset   Lymphoma Mother    Lung cancer Father    Pulmonary embolism Brother    Colon cancer Neg Hx    Esophageal cancer Neg Hx    Liver cancer Neg Hx    Pancreatic cancer Neg Hx    Rectal cancer Neg Hx    Stomach cancer Neg Hx      Allergies  Allergen Reactions   Lisinopril     REACTION: COUGH    Moxifloxacin     REACTION: N \\T \ V   Sulfa Antibiotics     Other reaction(s): Other (See Comments), Other (See Comments) Pt cannot remember rxn Pt cannot remember rxn    Sulfonamide Derivatives Other (See Comments)    Pt cannot remember rxn     REVIEW OF SYSTEMS (Negative unless checked)  Constitutional: [] Weight loss  [] Fever  [] Chills Cardiac: [] Chest pain   [] Chest pressure   [] Palpitations   [] Shortness of breath when laying flat   [] Shortness of breath at rest   [x] Shortness of breath with exertion. Vascular:  [] Pain in legs with walking   [] Pain in legs at rest   [] Pain in legs when laying flat   [] Claudication   [] Pain in feet when walking  [] Pain in feet at rest  [] Pain in feet when laying flat   [] History of DVT   [] Phlebitis   [] Swelling in legs   [] Varicose veins   [] Non-healing ulcers Pulmonary:   [] Uses home oxygen   [] Productive cough   [] Hemoptysis   [] Wheeze  [] COPD   [] Asthma Neurologic:  [] Dizziness  [] Blackouts   [] Seizures   [] History of stroke   [x] History of TIA  [] Aphasia   [] Temporary blindness   [] Dysphagia   [] Weakness or numbness in arms   [] Weakness or numbness in legs Musculoskeletal:  [] Arthritis   [] Joint swelling   [] Joint pain   [] Low back pain Hematologic:  [] Easy bruising  [] Easy bleeding   [] Hypercoagulable state   [] Anemic   Gastrointestinal:  [] Blood in stool   [] Vomiting blood  [x] Gastroesophageal reflux/heartburn   [] Abdominal pain Genitourinary:  [] Chronic kidney disease   [] Difficult urination  [] Frequent urination  [] Burning with urination   [] Hematuria Skin:  [] Rashes   [] Ulcers   [] Wounds Psychological:  [] History of anxiety   []  History of major depression.  Physical Examination  BP (!) 157/96   Pulse 71   Resp 18   Ht 5\' 6"  (1.676 m)   Wt 192 lb 3.2 oz (87.2 kg)   BMI 31.02 kg/m  Gen:  WD/WN, NAD. Appears younger than stated age. Head: [place]/AT, No temporalis wasting. Ear/Nose/Throat: Hearing grossly intact, nares w/o erythema or  drainage Eyes: Conjunctiva clear. Sclera non-icteric Neck: Supple.  Trachea midline Pulmonary:  Good air movement, no use of accessory muscles.  Cardiac: RRR, no JVD Vascular:  Vessel Right Left  Radial Palpable Palpable  PT Palpable Palpable  DP Palpable Palpable   Gastrointestinal: soft, non-tender/non-distended. No guarding/reflex.  Musculoskeletal: M/S 5/5 throughout.  No deformity or atrophy. No edema. Neurologic: Sensation grossly intact in extremities.  Symmetrical.  Speech is fluent.  Psychiatric: Judgment intact, Mood & affect appropriate for pt's clinical situation. Dermatologic: No rashes or ulcers noted.  No cellulitis or open wounds.      Labs Recent Results (from the past 2160 hours)  VAS [place] ABI WITH/WO TBI     Status: None   Collection Time: 10/13/23  8:19 AM  Result Value Ref Range   Right ABI 1.13    Left ABI 1.06   Basic metabolic panel     Status: Abnormal   Collection Time: 10/13/23 12:30 PM  Result Value Ref Range   Sodium 135 135 - 145 mmol/L   Potassium 3.4 (L) 3.5 - 5.1 mmol/L   Chloride 103 98 - 111 mmol/L   CO2 23 22 - 32 mmol/L   Glucose, Bld 137 (H) 70 - 99 mg/dL    Comment: Glucose reference range applies only to samples taken after fasting for at least 8 hours.   BUN 18 8 - 23 mg/dL   Creatinine, Ser 9.56 0.44 - 1.00 mg/dL   Calcium 9.3 8.9 - 21.3 mg/dL   GFR, Estimated >08 >65 mL/min    Comment: (NOTE) Calculated using the CKD-EPI Creatinine Equation (2021)    Anion gap 9 5 - 15    Comment: Performed at [facility] Lab, [address] [place], [place] [address]  CBC     Status: None   Collection Time: 10/13/23 12:30 PM  Result Value Ref Range   WBC 7.2 4.0 - 10.5 K/uL   RBC 4.85 3.87 - 5.11 MIL/uL   Hemoglobin 13.1 12.0 - 15.0 g/dL   HCT 62.9 52.8 - 41.3 %   MCV 84.1 80.0 - 100.0 fL   MCH 27.0 26.0 - 34.0 pg   MCHC 32.1 30.0 - 36.0 g/dL   RDW 24.4 01.0 - 27.2 %   Platelets 313 150 - 400 K/uL    nRBC 0.0 0.0 - 0.2 %    Comment: Performed at [facility], [address], [place], [place] [address]  Troponin I (High Sensitivity)     Status: Abnormal   Collection Time: 10/13/23 12:30 PM  Result Value Ref Range   Troponin I (High Sensitivity) 48 (H) <18 ng/L    Comment: (NOTE) Elevated high sensitivity troponin I (hsTnI) values and significant  changes across serial measurements may suggest ACS but many other  chronic and acute conditions are known to elevate hsTnI results.  Refer to the "Links" section for chest pain algorithms and additional  guidance. Performed at [facility], [address] [place], [place] [address]   Troponin I (High Sensitivity)     Status: Abnormal   Collection Time: 10/13/23  4:00 PM  Result Value Ref Range   Troponin I (High Sensitivity) 48 (H) <18 ng/L    Comment: (NOTE) Elevated high sensitivity troponin I (hsTnI) values and significant  changes across serial measurements may suggest ACS but many other  chronic and acute conditions are known to elevate hsTnI results.  Refer to the "Links" section for chest pain algorithms and additional  guidance. Performed at [facility], [address] [place], [place] [address]   Magnesium     Status: None   Collection Time: 10/14/23  1:25 AM  Result Value Ref Range  Magnesium 2.3 1.7 - 2.4 mg/dL    Comment: Performed at [facility], [address] [place], [place] [address]  Brain natriuretic peptide     Status: None   Collection Time: 10/14/23  1:25 AM  Result Value Ref Range   B Natriuretic Peptide 32.7 0.0 - 100.0 pg/mL    Comment: Performed at [facility], [address], [place], [place] [address]  TSH     Status: None   Collection Time: 10/14/23  1:25 AM  Result Value Ref Range   TSH 1.357 0.350 - 4.500 uIU/mL    Comment: Performed by a 3rd Generation assay with a functional sensitivity of <=0.01 uIU/mL. Performed at [facility], [address],  [place], [place] [address]   Hepatic function panel     Status: None   Collection Time: 10/14/23  1:25 AM  Result Value Ref Range   Total Protein 6.8 6.5 - 8.1 g/dL   Albumin 3.9 3.5 - 5.0 g/dL   AST 23 15 - 41 U/L    Comment: HEMOLYSIS AT THIS LEVEL MAY AFFECT RESULT   ALT 18 0 - 44 U/L    Comment: HEMOLYSIS AT THIS LEVEL MAY AFFECT RESULT   Alkaline Phosphatase 82 38 - 126 U/L   Total Bilirubin 0.7 0.0 - 1.2 mg/dL    Comment: HEMOLYSIS AT THIS LEVEL MAY AFFECT RESULT   Bilirubin, Direct 0.2 0.0 - 0.2 mg/dL   Indirect Bilirubin 0.5 0.3 - 0.9 mg/dL    Comment: Performed at [facility], [address] [place], [place] [address]  HIV Antibody (routine testing w rflx)     Status: None   Collection Time: 10/14/23  1:25 AM  Result Value Ref Range   HIV Screen 4th Generation wRfx Non Reactive Non Reactive    Comment: Performed at [facility], [address], [place], [place] [address]  Lipid panel     Status: Abnormal   Collection Time: 10/14/23  1:25 AM  Result Value Ref Range   Cholesterol 149 0 - 200 mg/dL   Triglycerides 130 (H) <150 mg/dL   HDL 42 >86 mg/dL   Total CHOL/HDL Ratio 3.5 RATIO   VLDL 75 (H) 0 - 40 mg/dL   LDL Cholesterol 32 0 - 99 mg/dL    Comment:        Total Cholesterol/HDL:CHD Risk Coronary Heart Disease Risk Table                     Men   Women  1/2 Average Risk   3.4   3.3  Average Risk       5.0   4.4  2 X Average Risk   9.6   7.1  3 X Average Risk  23.4   11.0        Use the calculated Patient Ratio above and the CHD Risk Table to determine the patient's CHD Risk.        ATP III CLASSIFICATION (LDL):  <100     mg/dL   Optimal  578-469  mg/dL   Near or Above                    Optimal  130-159  mg/dL   Borderline  629-528  mg/dL   High  >413     mg/dL   Very High Performed at [facility], [address] [place], [place] [address]   Hemoglobin  A1c     Status: Abnormal   Collection Time: 10/14/23  1:25 AM  Result Value Ref  Range   Hgb A1c MFr Bld 7.0 (H) 4.8 - 5.6 %    Comment: (NOTE) Pre diabetes:          5.7%-6.4%  Diabetes:              >6.4%  Glycemic control for   <7.0% adults with diabetes    Mean Plasma Glucose 154.2 mg/dL    Comment: Performed at [facility], [address], [place], [place] [address]  Heparin level (unfractionated)     Status: Abnormal   Collection Time: 10/14/23  9:57 AM  Result Value Ref Range   Heparin Unfractionated 0.25 (L) 0.30 - 0.70 IU/mL    Comment: (NOTE) The clinical reportable range upper limit is being lowered to >1.10 to align with the FDA approved guidance for the current laboratory assay.  If heparin results are below expected values, and patient dosage has  been confirmed, suggest follow up testing of antithrombin III levels. Performed at [facility], [address], [place], [place] [address]   CBC     Status: Abnormal   Collection Time: 10/14/23  9:57 AM  Result Value Ref Range   WBC 6.4 4.0 - 10.5 K/uL   RBC 4.84 3.87 - 5.11 MIL/uL   Hemoglobin 13.2 12.0 - 15.0 g/dL   HCT 63.0 16.0 - 10.9 %   MCV 84.7 80.0 - 100.0 fL   MCH 27.3 26.0 - 34.0 pg   MCHC 32.2 30.0 - 36.0 g/dL   RDW 32.3 (H) 55.7 - 32.2 %   Platelets 298 150 - 400 K/uL   nRBC 0.0 0.0 - 0.2 %    Comment: Performed at [facility], [address], [place], [place] [address]  Basic metabolic panel     Status: Abnormal   Collection Time: 10/14/23  9:57 AM  Result Value Ref Range   Sodium 139 135 - 145 mmol/L   Potassium 4.1 3.5 - 5.1 mmol/L   Chloride 107 98 - 111 mmol/L   CO2 23 22 - 32 mmol/L   Glucose, Bld 119 (H) 70 - 99 mg/dL    Comment: Glucose reference range applies only to samples taken after fasting for at least 8 hours.   BUN 13 8 - 23 mg/dL   Creatinine, Ser 7.06 0.44 - 1.00 mg/dL   Calcium 9.5 8.9 - 23.7 mg/dL   GFR, Estimated >62 >83 mL/min    Comment: (NOTE) Calculated using the CKD-EPI Creatinine Equation (2021)    Anion gap 9 5 - 15     Comment: Performed at [facility], [address] [place], [place] [address]  Magnesium     Status: None   Collection Time: 10/14/23  9:57 AM  Result Value Ref Range   Magnesium 2.2 1.7 - 2.4 mg/dL    Comment: Performed at [facility], [address], [place], [place] [address]  Phosphorus     Status: None   Collection Time: 10/14/23  9:57 AM  Result Value Ref Range   Phosphorus 3.3 2.5 - 4.6 mg/dL    Comment: Performed at [facility], [address], [place], [place] [address]  CBC     Status: Abnormal   Collection Time: 10/15/23  3:42 AM  Result Value Ref Range   WBC 6.7 4.0 - 10.5 K/uL   RBC 4.45 3.87 -  5.11 MIL/uL   Hemoglobin 12.2 12.0 - 15.0 g/dL   HCT 16.1 09.6 - 04.5 %   MCV 84.0 80.0 - 100.0 fL   MCH 27.4 26.0 - 34.0 pg   MCHC 32.6 30.0 - 36.0 g/dL   RDW 40.9 (H) 81.1 - 91.4 %   Platelets 270 150 - 400 K/uL   nRBC 0.0 0.0 - 0.2 %    Comment: Performed at [facility], [address] [place], [place] [address]  Basic metabolic panel     Status: Abnormal   Collection Time: 10/15/23  3:42 AM  Result Value Ref Range   Sodium 136 135 - 145 mmol/L   Potassium 3.9 3.5 - 5.1 mmol/L   Chloride 107 98 - 111 mmol/L   CO2 23 22 - 32 mmol/L   Glucose, Bld 129 (H) 70 - 99 mg/dL    Comment: Glucose reference range applies only to samples taken after fasting for at least 8 hours.   BUN 13 8 - 23 mg/dL   Creatinine, Ser 6.21 0.44 - 1.00 mg/dL   Calcium 9.1 8.9 - 30.8 mg/dL   GFR, Estimated >65 >78 mL/min    Comment: (NOTE) Calculated using the CKD-EPI Creatinine Equation (2021)    Anion gap 6 5 - 15    Comment: Performed at [facility], [address], [place], [place] [address]  Magnesium     Status: None   Collection Time: 10/15/23  3:42 AM  Result Value Ref Range   Magnesium 2.1 1.7 - 2.4 mg/dL    Comment: Performed at [facility], [address], [place], [place] [address]  Lipoprotein A (LPA)     Status: Abnormal   Collection  Time: 10/15/23  3:42 AM  Result Value Ref Range   Lipoprotein (a) 51.0 (H) <75.0 nmol/L    Comment: (NOTE) Note:  Values greater than or equal to 75.0 nmol/L may       indicate an independent risk factor for CHD,       but must be evaluated with caution when applied       to non-Caucasian populations due to the       influence of genetic factors on Lp(a) across       ethnicities. Performed At: [facility] [address] [place], [place] [id number] Jolene Schimke MD VO:[phone]   ECHOCARDIOGRAM COMPLETE     Status: None   Collection Time: 10/22/23 10:05 AM  Result Value Ref Range   S' Lateral 3.60 cm   AV Area VTI 1.90 cm2   AV Mean grad 5.0 mmHg   Single Plane A4C EF 56.4 %   Single Plane A2C EF 62.0 %   Calc EF 59.2 %   AV Area mean vel 1.71 cm2   Area-P 1/2 2.42 cm2   AR max vel 1.71 cm2   AV Peak grad 11.2 mmHg   Ao pk vel 1.67 m/s   Radius 0.50 cm   MV M vel 4.91 m/s   MV Peak grad 96.4 mmHg   MV VTI 1.48 cm2   Est EF 55 - 60%   Microalbumin / creatinine urine ratio     Status: None   Collection Time: 10/26/23  5:27 PM  Result Value Ref Range   Creatinine, Urine 64.0 Not Estab. mg/dL   Microalbumin, Urine 6.9 Not Estab. ug/mL   Microalb/Creat Ratio 11 0 - 29 mg/g creat    Comment:  Normal:                0 -  29                        Moderately increased: 30 - 300                        Severely increased:       >300     Radiology ECHOCARDIOGRAM COMPLETE Result Date: 10/22/2023    ECHOCARDIOGRAM REPORT   Patient Name:   Mariah [name] Date of Exam: 10/22/2023 Medical Rec #:  [id number]         Height:       66.0 in Accession #:    [id number]        Weight:       188.6 lb Date of Birth:  [birth year]         BSA:          1.951 m Patient Age:    62 years          BP:           136/72 mmHg Patient Gender: F                 HR:           63 bpm. Exam Location:  Outpatient Procedure: 2D Echo, Cardiac Doppler, Color Doppler and Strain  Analysis Indications:    Cardiomyopathy  History:        Patient has prior history of Echocardiogram examinations, most                 recent 09/19/2019. Cardiomyopathy, TIA, Signs/Symptoms:Chest Pain;                 Risk Factors:Hypertension, Diabetes and Dyslipidemia.  Sonographer:    Mikki Harbor Referring Phys: [phone] Estevan Ryder MCINTYRE  Sonographer Comments: Global longitudinal strain was attempted. IMPRESSIONS  1. Left ventricular ejection fraction, by estimation, is 55 to 60%. The left ventricle has normal function. The left ventricle has no regional wall motion abnormalities. There is mild left ventricular hypertrophy. Left ventricular diastolic parameters are consistent with Grade II diastolic dysfunction (pseudonormalization). Elevated left atrial pressure.  2. Right ventricular systolic function is normal. The right ventricular size is normal. There is normal pulmonary artery systolic pressure. The estimated right ventricular systolic pressure is 31.3 mmHg.  3. Left atrial size was severely dilated.  4. The mitral valve is abnormal. Moderate lealeft thickening Mild mitral valve regurgitation.  5. The aortic valve is tricuspid. Aortic valve regurgitation is not visualized. Aortic valve sclerosis is present, with no evidence of aortic valve stenosis.  6. The inferior vena cava is normal in size with greater than 50% respiratory variability, suggesting right atrial pressure of 3 mmHg. FINDINGS  Left Ventricle: Left ventricular ejection fraction, by estimation, is 55 to 60%. The left ventricle has normal function. The left ventricle has no regional wall motion abnormalities. The left ventricular internal cavity size was normal in size. There is  mild left ventricular hypertrophy. Left ventricular diastolic parameters are consistent with Grade II diastolic dysfunction (pseudonormalization). Elevated left atrial pressure. Right Ventricle: The right ventricular size is normal. No increase in right ventricular  wall thickness. Right ventricular systolic function is normal. There is normal pulmonary artery systolic pressure. The tricuspid regurgitant velocity is 2.66 m/s, and  with an assumed right atrial pressure of 3 mmHg, the estimated  right ventricular systolic pressure is 31.3 mmHg. Left Atrium: Left atrial size was severely dilated. Right Atrium: Right atrial size was normal in size. Pericardium: There is no evidence of pericardial effusion. Mitral Valve: The mitral valve is abnormal. There is moderate thickening of the mitral valve leaflet(s). Mild mitral valve regurgitation. MV peak gradient, 8.8 mmHg. The mean mitral valve gradient is 3.0 mmHg. Tricuspid Valve: The tricuspid valve is normal in structure. Tricuspid valve regurgitation is trivial. Aortic Valve: The aortic valve is tricuspid. Aortic valve regurgitation is not visualized. Aortic valve sclerosis is present, with no evidence of aortic valve stenosis. Aortic valve mean gradient measures 5.0 mmHg. Aortic valve peak gradient measures 11.2 mmHg. Aortic valve area, by VTI measures 1.90 cm. Pulmonic Valve: The pulmonic valve was not well visualized. Pulmonic valve regurgitation is trivial. Aorta: The aortic root is normal in size and structure. Venous: The inferior vena cava is normal in size with greater than 50% respiratory variability, suggesting right atrial pressure of 3 mmHg. IAS/Shunts: The interatrial septum was not well visualized.  LEFT VENTRICLE PLAX 2D LVIDd:         5.00 cm      Diastology LVIDs:         3.60 cm      LV e' medial:    5.44 cm/s LV PW:         1.10 cm      LV E/e' medial:  21.0 LV IVS:        1.20 cm      LV e' lateral:   5.11 cm/s LVOT diam:     1.90 cm      LV E/e' lateral: 22.3 LV SV:         72 LV SV Index:   37 LVOT Area:     2.84 cm  LV Volumes (MOD) LV vol d, MOD A2C: 132.0 ml LV vol d, MOD A4C: 124.0 ml LV vol s, MOD A2C: 50.2 ml LV vol s, MOD A4C: 54.1 ml LV SV MOD A2C:     81.8 ml LV SV MOD A4C:     124.0 ml LV SV MOD  BP:      77.3 ml RIGHT VENTRICLE RV Basal diam:  3.25 cm RV Mid diam:    3.20 cm RV S prime:     11.50 cm/s TAPSE (M-mode): 2.5 cm LEFT ATRIUM             Index        RIGHT ATRIUM           Index LA diam:        5.20 cm 2.67 cm/m   RA Area:     15.80 cm LA Vol (A2C):   98.6 ml 50.55 ml/m  RA Volume:   38.50 ml  19.74 ml/m LA Vol (A4C):   90.2 ml 46.24 ml/m LA Biplane Vol: 99.4 ml 50.96 ml/m  AORTIC VALVE                     PULMONIC VALVE AV Area (Vmax):    1.71 cm      PV Vmax:       1.30 m/s AV Area (Vmean):   1.71 cm      PV Peak grad:  6.8 mmHg AV Area (VTI):     1.90 cm AV Vmax:           167.00 cm/s AV Vmean:          107.000  cm/s AV VTI:            0.377 m AV Peak Grad:      11.2 mmHg AV Mean Grad:      5.0 mmHg LVOT Vmax:         101.00 cm/s LVOT Vmean:        64.400 cm/s LVOT VTI:          0.253 m LVOT/AV VTI ratio: 0.67  AORTA Ao Root diam: 2.60 cm MITRAL VALVE                  TRICUSPID VALVE MV Area (PHT): 2.42 cm       TR Peak grad:   28.3 mmHg MV Area VTI:   1.48 cm       TR Vmax:        266.00 cm/s MV Peak grad:  8.8 mmHg MV Mean grad:  3.0 mmHg       SHUNTS MV Vmax:       1.48 m/s       Systemic VTI:  0.25 m MV Vmean:      79.2 cm/s      Systemic Diam: 1.90 cm MV Decel Time: 314 msec MR Peak grad:    96.4 mmHg MR Mean grad:    51.0 mmHg MR Vmax:         491.00 cm/s MR Vmean:        337.0 cm/s MR PISA:         1.57 cm MR PISA Eff ROA: 30 mm MR PISA Radius:  0.50 cm MV E velocity: 114.00 cm/s MV A velocity: 113.00 cm/s MV E/A ratio:  1.01 Epifanio Lesches MD Electronically signed by Epifanio Lesches MD Signature Date/Time: 10/22/2023/10:17:05 AM    Final    CARDIAC CATHETERIZATION Result Date: 10/14/2023   1st Diag lesion is 50% stenosed.   The left ventricular systolic function is normal.   LV end diastolic pressure is normal.   The left ventricular ejection fraction is 50-55% by visual estimate. Mild nonobstructive CAD Overall good LV function. The apical segments are mildly  hypokinetic Normal LVEDP Plan: medical management. Echo pending.   VAS [place] ABI WITH/WO TBI Result Date: 10/14/2023  LOWER EXTREMITY DOPPLER STUDY Patient Name:  Mariah [name]  Date of Exam:   10/13/2023 Medical Rec #: [id number]          Accession #:    [id number] Date of Birth: 1961-07-27          Patient Gender: F Patient Age:   32 years Exam Location:  [facility] Vein & Vascluar Procedure:      VAS [place] ABI WITH/WO TBI Referring Phys: --------------------------------------------------------------------------------  Indications: Peripheral artery disease. High Risk Factors: Hypertension, hyperlipidemia.  Vascular Interventions: 06/10/2022 Percutaneous transluminal angioplasty and                         stent placement left superficial femoral and above-knee                         popliteal artery in 2 locations.                         Mechanical thrombectomy using the Rota Rex catheter left                         SFA and above-knee popliteal arteries.  Percutaneous transluminal angioplasty of the left                         profunda femoris to 4 mm. Comparison Study: 03/2023 Performing Technologist: Salvadore Farber RVT  Examination Guidelines: A complete evaluation includes at minimum, Doppler waveform signals and systolic blood pressure reading at the level of bilateral brachial, anterior tibial, and posterior tibial arteries, when vessel segments are accessible. Bilateral testing is considered an integral part of a complete examination. Photoelectric Plethysmograph (PPG) waveforms and toe systolic pressure readings are included as required and additional duplex testing as needed. Limited examinations for reoccurring indications may be performed as noted.  ABI Findings: +---------+------------------+-----+---------+--------+ Right    Rt Pressure (mmHg)IndexWaveform Comment  +---------+------------------+-----+---------+--------+ Brachial 142                                       +---------+------------------+-----+---------+--------+ PTA      164               1.13 triphasic         +---------+------------------+-----+---------+--------+ DP       160               1.10 biphasic          +---------+------------------+-----+---------+--------+ Great Toe141               0.97 Normal            +---------+------------------+-----+---------+--------+ +---------+------------------+-----+---------+-------+ Left     Lt Pressure (mmHg)IndexWaveform Comment +---------+------------------+-----+---------+-------+ Brachial 145                                     +---------+------------------+-----+---------+-------+ PTA      153               1.06 triphasic        +---------+------------------+-----+---------+-------+ DP       149               1.03 triphasic        +---------+------------------+-----+---------+-------+ Great Toe150               1.03 Normal           +---------+------------------+-----+---------+-------+ +-------+-----------+-----------+------------+------------+ ABI/TBIToday's ABIToday's TBIPrevious ABIPrevious TBI +-------+-----------+-----------+------------+------------+ Right  1.13       .97        1.10        1.00         +-------+-----------+-----------+------------+------------+ Left   1.06       1.03       .89         .96          +-------+-----------+-----------+------------+------------+ Left ABIs appear increased compared to prior study on 03/2023.  Summary: Right: Resting right ankle-brachial index is within normal range. The right toe-brachial index is normal. Left: Resting left ankle-brachial index is within normal range. The left toe-brachial index is normal. *See table(s) above for measurements and observations.  Electronically signed by Festus Barren MD on 10/14/2023 at 7:24:57 AM.    Final    DG Chest 2 View Result Date: 10/13/2023 CLINICAL DATA:  Chest pain EXAM: CHEST - 2 VIEW COMPARISON:  04/20/2016  FINDINGS: The heart size and mediastinal contours are within normal limits. Both lungs are clear. The visualized skeletal structures are unremarkable. IMPRESSION:  No active cardiopulmonary disease. Electronically Signed   By: Duanne Guess D.O.   On: 10/13/2023 13:26    Assessment/Plan  Atherosclerosis of native arteries of extremity with intermittent claudication (HCC) She had ABIs done a few weeks ago which showed normal multiphasic waveforms with ABIs of 1.13 on the right and 1.06 on the left.  On aspirin and statin agent.  Recheck in one year or sooner if problems develop in the interim.   Benign essential hypertension blood pressure control important in reducing the progression of atherosclerotic disease. On appropriate oral medications.   T2DM (type 2 diabetes mellitus) (HCC) blood glucose control important in reducing the progression of atherosclerotic disease. Also, involved in wound healing. On appropriate medications.   Hyperlipidemia lipid control important in reducing the progression of atherosclerotic disease. Continue statin therapy    Festus Barren, MD  11/06/2023 12:19 PM    This note was created with Dragon medical transcription system.  Any errors from dictation are purely unintentional

## 2023-11-06 NOTE — Assessment & Plan Note (Signed)
 blood glucose control important in reducing the progression of atherosclerotic disease. Also, involved in wound healing. On appropriate medications.

## 2023-11-09 ENCOUNTER — Ambulatory Visit (HOSPITAL_BASED_OUTPATIENT_CLINIC_OR_DEPARTMENT_OTHER): Payer: Medicaid Other

## 2023-11-09 ENCOUNTER — Telehealth (HOSPITAL_BASED_OUTPATIENT_CLINIC_OR_DEPARTMENT_OTHER): Payer: Self-pay

## 2023-11-09 ENCOUNTER — Ambulatory Visit (INDEPENDENT_AMBULATORY_CARE_PROVIDER_SITE_OTHER): Payer: Medicaid Other

## 2023-11-09 DIAGNOSIS — Z0000 Encounter for general adult medical examination without abnormal findings: Secondary | ICD-10-CM

## 2023-11-09 NOTE — Telephone Encounter (Signed)
 Called patient to determine if she was in route to appointment. Patient asked me to hold because she was on another call. Call disconnected.

## 2023-11-09 NOTE — Telephone Encounter (Signed)
 Called patient back after call disconnected. Patient stated that she attempted to call earlier today to change appointment from in-person to telephonic. Verified number that patient called and patient had called the wrong number to reach me directly. Changed patient's appointment time to hold initial health coaching appointment over the phone today.   [name], MS, ERHD, [facility]  Care Guide, Health & Wellness Coach [address] [place] [address] Telephone: [phone] Email: [email or web address]

## 2023-11-09 NOTE — Progress Notes (Addendum)
 HEALTH & WELLNESS COACHING INITIAL INTAKE   Appointment Outcome: Completed, Session #: Initial Start time: 9:16am   End time: 10:17am   Total Mins: 61 mins   What are the Patient's goals from Coaching? Patient wants to gradually increase exercise and develop healthy eating habits.   Why did they seek coaching now? Patient had a myocardial infarction followed by a left heart cath and coronary angiography in late January 2025. Patient subsequent labs revealed that she has elevated triglycerides and LDL cholesterol.    Readiness - What stage is the patient in regarding their goal(s)? Patient is in the action stage of increasing her exercise and developing healthy eating habits.    Coaching Progress Notes: Patient has made some changes to implement healthy eating habits such as eliminating chips and cookies, soda, canned goods, and fried foods.  Patient is focusing on baking, grilling, roasting, sauteing, and broiling foods. Patient is including a variety fresh produce, herbs, whole wheat products, and increasing water intake.  Patient is reading food labels and uses regular plate but divide 1/2 vegetables, 1/4 lean meat, and 1/4 carb.  Patient stated that she finds it challenging to maintain eating healthy with her SNAP benefits.  Patient was exercising at [employer] with a friend that shared her membership for approximately 3 weeks, but due to her friend's schedule they were not able to continue their routine.  Patient has started walking with a neighbor for a mile to help build stamina and blood flow but has not timed her walk. Patient does take breaks as advised by provider.  Patient shared that she has some health conditions that impact her ability to engage in exercise and is looking to engage in exercises that are low impact.    Coaching Outcomes Patient is interested in going to the gym and has been informed of assistance for gym memberships via her [facility]  Medicaid.  Patient is interested in learning about additional resources/programs that helps with affordability of healthy foods.    AGREEMENTS SECTION   Overall Goal(s): To develop healthy eating habits and gradually increase physical activity to 150 minutes to lower LDL cholesterol and triglycerides to improve heart health over the next three months.                                         Agreement/Action Steps:  Improve healthy eating Read food labels to monitor cholesterol, saturated, and trans fats to make healthier food choices. Purchase fresh produce Refrain from frying foods   Increase exercise Workout at home 3 days per week for 20 minutes using YouTube videos Walk 3 days per week with friend for 1 mile (not timed)   Patient review of Health Coaching Agreement Reviewed Coaching Agreement and Code of Ethics with Patient during initial session. Answered any questions the patient had if any regarding the Coaching Agreement and Code of Ethics. Patient verbally agreed to adhere to the Coaching Agreement and to abide by the Code of Ethics.  Mailed patient with a hard/electronic copy of the Coaching Agreement and Code of Ethics.   Referrals: N/A  Resources: Patient was emailed/mailed reading food labels handout, cholesterol content in food, fiber content in food, snack time handout, and choosing healthy fats. Provided patient with the following [place] Medicaid resources:  [facility] Active & Fit [phone]  Eat Well Program [phone] [facility] Medicaid)

## 2023-11-10 ENCOUNTER — Other Ambulatory Visit: Payer: Self-pay

## 2023-11-10 MED ORDER — OMEPRAZOLE 40 MG PO CPDR: DELAYED_RELEASE_CAPSULE | ORAL | 3 refills | Status: DC

## 2023-11-14 ENCOUNTER — Telehealth: Payer: Self-pay | Admitting: Cardiology

## 2023-11-14 MED ORDER — METOPROLOL TARTRATE 25 MG PO TABS: 12.5000 mg | ORAL_TABLET | Freq: Two times a day (BID) | ORAL | 1 refills | Status: DC

## 2023-11-14 MED ORDER — ASPIRIN 81 MG PO TBEC: 81.0000 mg | DELAYED_RELEASE_TABLET | Freq: Every day | ORAL | 1 refills | Status: DC

## 2023-11-14 MED ORDER — FENOFIBRATE 160 MG PO TABS: 160.0000 mg | ORAL_TABLET | Freq: Every day | ORAL | 1 refills | Status: DC

## 2023-11-14 NOTE — Telephone Encounter (Signed)
 Patient called the answering service requesting refills of metoprolol, aspirin, and fenofibrate. Refills sent to her requested pharmacy. She was last seen by Robin Searing NP on 11/05/23. Confirmed medication doses with patient and with his most recent note  Jonita Albee, PA-C 11/14/2023 10:59 AM

## 2023-11-19 ENCOUNTER — Telehealth: Payer: Self-pay

## 2023-11-19 NOTE — Telephone Encounter (Signed)
 Patient LVM on nurse line requesting dental clearance.   She reports she is having a tooth extraction.   I attempted to call her back to discuss, however no answer. VM left asking her to return my call.   Will forward to PCP in the meantime.

## 2023-11-23 ENCOUNTER — Ambulatory Visit (HOSPITAL_BASED_OUTPATIENT_CLINIC_OR_DEPARTMENT_OTHER): Payer: Medicaid Other

## 2023-11-23 DIAGNOSIS — Z0000 Encounter for general adult medical examination without abnormal findings: Secondary | ICD-10-CM

## 2023-11-23 NOTE — Progress Notes (Signed)
 Appointment Outcome: Completed, Session #: 1                       Start time: 10:41am   End time: 11:12am   Total Mins: 31 minutes  AGREEMENTS SECTION   Overall Goal(s): To develop healthy eating habits and gradually increase physical activity to 150 minutes to lower LDL cholesterol and triglycerides to improve heart health over the next three months.                                           Agreement/Action Steps:  Improve healthy eating Read food labels to monitor cholesterol, saturated, and trans fats to make healthier food choices. Purchase fresh produce Refrain from frying foods     Increase exercise Workout at home 3 days per week for 20 minutes using YouTube videos Walk 3 days per week with friend for 1 mile (not timed)    Progress Notes:  Patient was able to implement all her action steps towards developing healthy eating habits to start lowering her cholesterol and triglycerides without any challenges.  Patient has begun to implement an exercise routine that incorporated her walking almost daily. Patient mentioned that she would take breaks if/when needed when walking. Patient exercised at home using a video from YouTube and is interested in trying an additional video.   Coaching Outcomes: Patient will continue to work on implementing her action steps over the next two weeks.  Patient is interested in incorporating more herbs and spices for seasoning her foods.  Patient is interested in reading food labels to identify artificial sugars/sugar alcohols in foods/beverages.    Attempted: Fulfilled - Patient completed the bi-weekly agreement in with modifications and was able to meet the challenge.   Referrals: N/A

## 2023-11-24 ENCOUNTER — Encounter: Payer: Self-pay | Admitting: *Deleted

## 2023-11-28 NOTE — Progress Notes (Unsigned)
 09/15/23- 62 yoF for ? Sleep evaluation Medical problem list includes Hx TIA, Mitral Prolapse, ASCVD, Hyperlipidemia, Nasal Turbinate Hypertrophy, Allergic Rhinitis, GERD, Cervical Disc/ Fusion, Lumbar DDD, Obesity,  Epworth score-5 Body weight today- 192 lbs  HPI The patient, with a history of degenerative disc disease and multiple neck surgeries, presents with snoring and difficulty sleeping. The patient reports waking up due to snoring and experiencing nasal congestion and post-nasal drip. The patient also reports difficulty falling asleep, often taking up to two hours to fall asleep. The patient describes waking up due to choking on mucus drainage and nasal congestion. The patient reports this occurs all year round and is not seasonally affected. The patient has a known history of allergies to dust mites, grass, and other allergens, and uses Flonase, azelastine, and saline mist for allergy management. The patient also reports dryness in the nose and throat, which is managed with a prescribed gel. The patient denies daytime sleepiness and does not consume caffeine. The patient has a heart murmur but denies any other known heart or lung conditions.   Assessment and Plan:    Suspected Sleep Apnea Reports snoring and waking up due to nasal congestion and postnasal drainage. No prior evaluation for sleep apnea. No daytime sleepiness reported. Physical examination reveals limited space in the throat due to long palate and thick tongue base. -Order home sleep test to evaluate for sleep apnea. -Contact patient in two weeks to discuss results.   Chronic Nasal Congestion and Postnasal Drainage Reports difficulty sleeping due to nasal congestion and postnasal drainage. Currently using Flonase, azelastine, and saline mist. History of allergies. -Recommend over-the-counter nasal saline gel for nasal dryness. -Consider over-the-counter sleep aids such as Benadryl, Z-Quil, or Unisom to help with sleep and  potentially reduce postnasal drainage.   Insomnia Reports difficulty falling asleep, taking up to two hours. Expressed interest in medication to aid sleep. -Plan to address insomnia after results of sleep study to avoid oversedation. -Consider over-the-counter sleep aids such as Benadryl, Z-Quil, or Unisom in the interim.   General Health Maintenance -Recommend zero sugar Jolly Rancher candies for throat dryness.   11/30/23- 62 yoF followed forOSA, Insomnia, Chronic Rhinitis, complicated by Hx TIA, Mitral Prolapse, ASCVD, Hyperlipidemia, Nasal Turbinate Hypertrophy, Allergic Rhinitis, GERD, Cervical Disc/ Fusion, Lumbar DDD, Obesity,  HST 10/05/23- AHI 8.3/hr, desat to 87%, body weight 162 lbs Body weight today-  For treatment decision  CXR 10/13/23- IMPRESSION: No active cardiopulmonary disease.     ROS-see HPI   + = positive Constitutional:    weight loss, night sweats, fevers, chills, fatigue, lassitude. HEENT:    headaches, difficulty swallowing, tooth/dental problems, sore throat,       sneezing, itching, ear ache, nasal congestion, post nasal drip, snoring CV:    chest pain, orthopnea, PND, swelling in lower extremities, anasarca,                                   dizziness, palpitations Resp:   shortness of breath with exertion or at rest.                productive cough,   non-productive cough, coughing up of blood.              change in color of mucus.  wheezing.   Skin:    rash or lesions. GI:  No-   heartburn, indigestion, abdominal pain, nausea, vomiting, diarrhea,  change in bowel habits, loss of appetite GU: dysuria, change in color of urine, no urgency or frequency.   flank pain. MS:   joint pain, stiffness, decreased range of motion, back pain. Neuro-     nothing unusual Psych:  change in mood or affect.  depression or anxiety.   memory loss.  OBJ- Physical Exam General- Alert, Oriented, Affect-appropriate, Distress- none acute Skin- rash-none,  lesions- none, excoriation- none Lymphadenopathy- none Head- atraumatic            Eyes- Gross vision intact, PERRLA, conjunctivae and secretions clear            Ears- Hearing, canals-normal            Nose- Clear, no-Septal dev, mucus, polyps, erosion, perforation             Throat- Mallampati IV , mucosa clear , drainage- none, tonsils- atrophic, +teeth Neck- flexible , trachea midline, no stridor , thyroid nl, carotid no bruit Chest - symmetrical excursion , unlabored           Heart/CV- RRR , no murmur heard , no gallop  , no rub, nl s1 s2                           - JVD- none , edema- none, stasis changes- none, varices- none           Lung- clear to P&A, wheeze- none, cough- none , dullness-none, rub- none           Chest wall-  Abd-  Br/ Gen/ Rectal- Not done, not indicated Extrem- cyanosis- none, clubbing, none, atrophy- none, strength- nl Neuro- grossly intact to observation

## 2023-11-30 ENCOUNTER — Encounter: Payer: Self-pay | Admitting: Internal Medicine

## 2023-11-30 ENCOUNTER — Ambulatory Visit: Payer: Medicaid Other | Admitting: Internal Medicine

## 2023-11-30 VITALS — BP 110/60 | HR 67 | Temp 98.1°F | Ht 66.0 in | Wt 190.4 lb

## 2023-11-30 DIAGNOSIS — G4700 Insomnia, unspecified: Secondary | ICD-10-CM | POA: Diagnosis not present

## 2023-11-30 DIAGNOSIS — G4730 Sleep apnea, unspecified: Secondary | ICD-10-CM | POA: Diagnosis not present

## 2023-11-30 DIAGNOSIS — G4733 Obstructive sleep apnea (adult) (pediatric): Secondary | ICD-10-CM

## 2023-11-30 DIAGNOSIS — J309 Allergic rhinitis, unspecified: Secondary | ICD-10-CM

## 2023-11-30 MED ORDER — TRAZODONE HCL 50 MG PO TABS: 50.0000 mg | ORAL_TABLET | Freq: Every day | ORAL | 2 refills | Status: DC

## 2023-11-30 NOTE — Patient Instructions (Signed)
 Script sent for trazodone- try 1/2 or 1 tab a little before bedtime to help with insomnia  For your mild sleep apnea, we suggested trying to sleep off the flat of your back, and keep you weight down.

## 2023-12-03 NOTE — Progress Notes (Signed)
 Patient sent message stating that her upcoming health coaching visit will have to be held over the phone on 12/07/23 at 10:30am.  Hi Ms. Mariah [name] .Marland KitchenMarland KitchenMarland KitchenI'm contacting you to make you aware that our visit/meeting scheduled for the 12-07-23 date @ 10:30, will have to be over the phone as opposed to in person.  my contact # = [phone] if you have any questions.  Thank you Kindly Mariah [name]   I responded and informed the patient that I will update that in the appointment notes to give her a call instead for her appointment time. Appointment notes have been updated.   Mariah Munda, MS, ERHD, [facility]  Care Guide, Health & Wellness Coach [facility] Heart & Vascular Care Navigation Telephone: [phone] Email: [name]

## 2023-12-07 ENCOUNTER — Ambulatory Visit

## 2023-12-07 ENCOUNTER — Telehealth: Payer: Self-pay | Admitting: Licensed Clinical Social Worker

## 2023-12-07 NOTE — Telephone Encounter (Signed)
 CSW contacted patient to inform that Amalie Monterey [facility] Guide is out of the office unexpectedly and appointment will be cancelled for today. Care Guide will contact patient to reschedule. Message left. Lasandra Beech, LCSW, CCSW-MCS [phone]

## 2023-12-08 ENCOUNTER — Telehealth: Payer: Self-pay

## 2023-12-08 DIAGNOSIS — Z0000 Encounter for general adult medical examination without abnormal findings: Secondary | ICD-10-CM

## 2023-12-08 NOTE — Telephone Encounter (Signed)
 Called patient to reschedule health coaching appointment due to being out of the office on 12/07/23. Patient did not answer. Left message for patient to return call.   [name], MS, ERHD, [facility]  Care Guide [facility] Heart & Vascular Care Navigation Telephone: [phone] Email: [email or web address]

## 2023-12-11 ENCOUNTER — Ambulatory Visit
Admission: RE | Admit: 2023-12-11 | Discharge: 2023-12-11 | Disposition: A | Discharge status: Home / Self Care | Source: Ambulatory Visit | Attending: Family Medicine | Admitting: Family Medicine

## 2023-12-11 DIAGNOSIS — Z1231 Encounter for screening mammogram for malignant neoplasm of breast: Secondary | ICD-10-CM

## 2023-12-15 ENCOUNTER — Encounter: Payer: Self-pay | Admitting: Family Medicine

## 2023-12-16 ENCOUNTER — Telehealth: Payer: Self-pay

## 2023-12-16 ENCOUNTER — Encounter: Payer: Self-pay | Admitting: Podiatry

## 2023-12-16 ENCOUNTER — Ambulatory Visit (INDEPENDENT_AMBULATORY_CARE_PROVIDER_SITE_OTHER)

## 2023-12-16 ENCOUNTER — Ambulatory Visit (INDEPENDENT_AMBULATORY_CARE_PROVIDER_SITE_OTHER): Admitting: Podiatry

## 2023-12-16 ENCOUNTER — Other Ambulatory Visit: Payer: Self-pay | Admitting: Family Medicine

## 2023-12-16 DIAGNOSIS — M205X1 Other deformities of toe(s) (acquired), right foot: Secondary | ICD-10-CM

## 2023-12-16 DIAGNOSIS — M79671 Pain in right foot: Secondary | ICD-10-CM

## 2023-12-16 DIAGNOSIS — Z0000 Encounter for general adult medical examination without abnormal findings: Secondary | ICD-10-CM

## 2023-12-16 DIAGNOSIS — I10 Essential (primary) hypertension: Secondary | ICD-10-CM

## 2023-12-16 NOTE — Telephone Encounter (Signed)
 Second attempt calling patient to reschedule health coaching appointment due to being out of the office on 12/07/23. Patient did not answer. Left message for patient to return call.    [name], MS, ERHD, [facility]  Care Guide [facility] Heart & Vascular Care Navigation Telephone: [phone] Email: [email or web address]

## 2023-12-16 NOTE — Progress Notes (Signed)
 Chief Complaint  Patient presents with   Foot Pain    NP- R ankle swelling and protruding out the side of R 1st toe    HPI: 63 y.o. female presenting today as a reestablish new patient for evaluation of pain and tenderness associated to the anterior lateral aspect of the right ankle.  She states that it is intermittent and only with certain close toed lace up shoes.  She also has intermittent tenderness and pain to the great toe joint of the right foot as well.  Past Medical History:  Diagnosis Date   Cramp of muscle of left upper extremity 06/24/2019   Decreased peripheral vision of left eye 08/19/2019   Degenerative disc disease, lumbar    Ear pain 11/21/2019   Elevated hemoglobin A1c 10/26/2019   Elevated hemoglobin A1c 11/23/2019   GERD (gastroesophageal reflux disease)    Hypertension    Insomnia 07/11/2010   Qualifier: Diagnosis of  By: Lelon Perla MD, [facility]     Mitral valve prolapse    Nasal congestion 04/01/2019   Numbness and tingling of right hand 01/29/2017   Pain and swelling of right knee 12/29/2019   Postprandial bloating 04/01/2019   Pre-diabetes    Prediabetes 10/24/2016   Shortness of breath 11/21/2019   TIA (transient ischemic attack)     Past Surgical History:  Procedure Laterality Date   ABDOMINAL HYSTERECTOMY     ABDOMINAL HYSTERECTOMY     BACK SURGERY     CHOLECYSTECTOMY     JOINT REPLACEMENT     KNEE SURGERY     LEFT HEART CATH AND CORONARY ANGIOGRAPHY N/A 10/14/2023   Procedure: LEFT HEART CATH AND CORONARY ANGIOGRAPHY;  Surgeon: Swaziland, Peter M, MD;  Location: [facility] INVASIVE CV LAB;  Service: Cardiovascular;  Laterality: N/A;   LOWER EXTREMITY ANGIOGRAPHY Left 06/10/2022   Procedure: Lower Extremity Angiography;  Surgeon: Renford Dills, MD;  Location: [facility] INVASIVE CV LAB;  Service: Cardiovascular;  Laterality: Left;    Allergies  Allergen Reactions   Lisinopril     REACTION: COUGH   Moxifloxacin     REACTION: N \\T \ V   Sulfa Antibiotics      Other reaction(s): Other (See Comments), Other (See Comments) Pt cannot remember rxn Pt cannot remember rxn    Sulfonamide Derivatives Other (See Comments)    Pt cannot remember rxn     Physical Exam: General: The patient is alert and oriented x3 in no acute distress.  Dermatology: Skin is warm, dry and supple bilateral lower extremities.   Vascular: Palpable pedal pulses bilaterally. Capillary refill within normal limits.  No appreciable edema.  No erythema.  Neurological: Grossly intact via light touch  Musculoskeletal Exam: Minimal tenderness with palpation to the anterior lateral aspect of the right ankle.  Limited range of motion of the first MTP with crepitus consistent with advanced severe arthritis to the joint.  Radiographic Exam RT foot and ankle 12/16/2023:  Tibiotalar joint congruent without any indication of degenerative changes.  Advanced severe degenerative changes with para-articular spurring noted to the first MTP of the right foot.  No acute fractures identified.  Assessment/Plan of Care: 1.  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  Intermittent ankle capsulitis; anterolateral  -Patient evaluated.  X-rays reviewed -Regards to the right ankle, she states that the majority the pain is when she wears certain shoes that are laced.  Recommend paying close attention to her shoes to ensure that they do not irritate the ankle -Declined cortisone injection -  Recommended [employer] for good supportive walking shoes. -The hallux limitus to the right foot, currently it is minimally symptomatic.  The x-ray was reviewed in detail demonstrating the severe arthritis with para-articular spurring to the joint.  Explained that if it becomes debilitating and affecting her quality of life on a daily basis recommend either arthroplasty with implant vs. arthrodesis surgery to the joint. -Return to clinic as needed      Felecia Shelling, DPM [facility]  Dr. Felecia Shelling, DPM    [address] [place], [place] [address]                Office [phone]  Fax [phone]

## 2023-12-21 ENCOUNTER — Ambulatory Visit: Payer: Medicaid Other | Admitting: Internal Medicine

## 2024-01-07 ENCOUNTER — Other Ambulatory Visit: Payer: Self-pay

## 2024-01-07 ENCOUNTER — Emergency Department (HOSPITAL_COMMUNITY)
Admission: EM | Admit: 2024-01-07 | Discharge: 2024-01-07 | Disposition: A | Discharge status: Home / Self Care | Attending: Emergency Medicine | Admitting: Emergency Medicine

## 2024-01-07 DIAGNOSIS — Z7982 Long term (current) use of aspirin: Secondary | ICD-10-CM | POA: Insufficient documentation

## 2024-01-07 DIAGNOSIS — M79651 Pain in right thigh: Secondary | ICD-10-CM | POA: Diagnosis present

## 2024-01-07 DIAGNOSIS — M7631 Iliotibial band syndrome, right leg: Secondary | ICD-10-CM | POA: Diagnosis not present

## 2024-01-07 MED ORDER — CYCLOBENZAPRINE HCL 10 MG PO TABS: 10.0000 mg | ORAL_TABLET | Freq: Two times a day (BID) | ORAL | 0 refills | Status: DC | PRN

## 2024-01-07 MED ORDER — NAPROXEN 500 MG PO TABS: 500.0000 mg | ORAL_TABLET | Freq: Two times a day (BID) | ORAL | 0 refills | Status: DC

## 2024-01-07 MED ORDER — ACETAMINOPHEN 500 MG PO TABS
1000.0000 mg | ORAL_TABLET | Freq: Three times a day (TID) | ORAL | Status: AC | PRN
Start: 2024-01-07 — End: ?

## 2024-01-07 NOTE — ED Provider Notes (Signed)
 [place]MENT AT [facility] Provider Note   CSN: [id number] Arrival date & time: 01/07/24  4098     History  Chief Complaint  Patient presents with   Leg Pain     Mariah [name] is a 63 y.o. female history of lumbar degenerative disc disease, right hallux limitus presenting with lateral right thigh pain for 1 week.  Patient reports that over the past several weeks she has increased her physical activity-walking and hip abductor strength exercises.  She is weightbearing, normal gait but painful.  No recent injury.  No swelling, ecchymosis.  No fevers nor systemic symptoms.  She has not been taking any medications for this.   Leg Pain Associated symptoms: no back pain, no fatigue and no fever       Home Medications Prior to Admission medications   Medication Sig Start Date End Date Taking? Authorizing Provider  aspirin  EC 81 MG tablet Take 1 tablet (81 mg total) by mouth daily. Swallow whole. 11/14/23   Debria Fang, PA-C  atorvastatin  (LIPITOR) 40 MG tablet Take 1 tablet (40 mg total) by mouth daily. 10/26/23   Jonne Netters, MD  azelastine  (ASTELIN ) 0.1 % nasal spray 1 puff in each nostril Nasally Twice a day 10/16/23   Edison Gore, MD  fluticasone  (FLONASE ) 50 MCG/ACT nasal spray INSTILL 2 SPRAY IN EACH NOSTRIL EVERY DAY 12/16/22   Jonne Netters, MD  hydrochlorothiazide  (HYDRODIURIL ) 25 MG tablet TAKE 1 TABLET(25 MG) BY MOUTH DAILY 05/14/23   Jonne Netters, MD  losartan  (COZAAR ) 25 MG tablet TAKE 1 TABLET(25 MG) BY MOUTH DAILY 12/16/23   Jonne Netters, MD  metoprolol  tartrate (LOPRESSOR ) 25 MG tablet Take 0.5 tablets (12.5 mg total) by mouth 2 (two) times daily. 11/14/23   Debria Fang, PA-C  omeprazole  (PRILOSEC) 40 MG capsule TAKE 1 CAPSULE(40 MG) BY MOUTH DAILY 11/10/23   Jonne Netters, MD  sodium chloride  (OCEAN) 0.65 % nasal spray Place into the nose as needed for congestion.    [provider]      Allergies     Lisinopril, Moxifloxacin, Sulfa antibiotics, and Sulfonamide derivatives    Review of Systems   Review of Systems  Constitutional:  Negative for activity change, fatigue and fever.  Musculoskeletal:  Negative for arthralgias, back pain, gait problem and joint swelling.  Skin:  Negative for rash.  All other systems reviewed and are negative.   Physical Exam Updated Vital Signs BP 139/76 (BP Location: Right Arm)   Pulse 66   Temp 97.7 F (36.5 C) (Oral)   Resp 16   Ht 5\' 6"  (1.676 m)   Wt 85.3 kg   SpO2 100%   BMI 30.34 kg/m  Physical Exam Constitutional:      Appearance: Normal appearance.  HENT:     Head: Normocephalic and atraumatic.  Eyes:     Extraocular Movements: Extraocular movements intact.     Pupils: Pupils are equal, round, and reactive to light.  Cardiovascular:     Rate and Rhythm: Normal rate and regular rhythm.     Pulses: Normal pulses.  Pulmonary:     Effort: Pulmonary effort is normal.  Musculoskeletal:     Comments: Right hip/leg:No gross deformity, no ecchymosis, no swelling.  TTP over IT band, mild TTP over greater trochanter.  F ROM.  5/5 strength, normal sensation. FADIR/FABER negative Straight leg negative Ober positive Right knee: No gross deformity, no ecchymosis, no swelling.  F ROM.  5/5 strength. Right foot/ankle: No  gross deformity, no ecchymosis, no swelling.  F ROM.  5/5 strength.  Skin:    General: Skin is warm and dry.     Capillary Refill: Capillary refill takes less than 2 seconds.  Neurological:     Mental Status: She is alert.     ED Results / Procedures / Treatments   Labs (all labs ordered are listed, but only abnormal results are displayed) Labs Reviewed - No data to display  EKG None  Radiology No results found.  Procedures Procedures    Medications Ordered in ED Medications - No data to display  ED Course/ Medical Decision Making/ A&P                                 Medical Decision Making 69 female  with history of lumbar DDD and right hallux limitus presenting with lateral right knee pain after increased activity for the past several weeks.  Differential includes: Osteoarthritis of hip, sciatica, greater trochanter bursitis, muscle strain, IT band syndrome.  Low concern for fracture without recent trauma, low concern for blood clot legs are symmetric (not swollen) and wells DVT score 0.  Exam consistent with IT band syndrome, possibly greater trochanter bursitis.  Do not see indication for imaging at this time.  Patient is hemodynamically stable, weightbearing and has normal gait.  Plan to treat with naproxen +tylenol  and Flexeril  + home exercise plan. Follow-up with PCP.  Risk OTC drugs. Prescription drug management.         Final Clinical Impression(s) / ED Diagnoses Final diagnoses:  None    Rx / DC Orders ED Discharge Orders     None         Lavada Porteous, DO 01/07/24 1005    Deatra Face, MD 01/07/24 [phone]

## 2024-01-07 NOTE — ED Triage Notes (Signed)
 Pt c/o R thigh pain for 1x week. No swelling or redness noted. No associated injury.  AOx4

## 2024-01-07 NOTE — Discharge Instructions (Addendum)
-   Take naproxen  500 mg twice daily as needed for pain - Take Flexeril  10 mg tablet twice daily as needed for pain - Please see the attached exercise instructions, I recommend these to help with your symptoms and strengthen your IT band -I recommend you follow-up with your PCP in the next couple weeks -If you develop significant leg swelling (wounds are larger than the other), inability to move your extremity, or severe pain despite your medication, please seek medical attention

## 2024-01-22 ENCOUNTER — Ambulatory Visit: Admitting: Family Medicine

## 2024-01-22 NOTE — Progress Notes (Deleted)
    SUBJECTIVE:   CHIEF COMPLAINT / HPI:   ED f/u Seen in ED on 4/24 for R leg pain, diagnosed with IT band syndrome.  Hypertension: - Medications: hydrochlorothiazide  25mg , Losartan  25mg , Metoprolol  12.5mg  BID - Compliance: *** - Checking BP at home: *** - Denies any SOB, CP, vision changes, LE edema, medication SEs, or symptoms of hypotension - Diet: *** - Exercise: ***   PERTINENT  PMH / PSH: ***  OBJECTIVE:   There were no vitals taken for this visit. ***  General: NAD, pleasant, able to participate in exam Cardiac: RRR, no murmurs. Respiratory: CTAB, normal effort, No wheezes, rales or rhonchi Abdomen: Bowel sounds present, nontender, nondistended Extremities: no edema or cyanosis. Skin: warm and dry, no rashes noted Neuro: alert, no obvious focal deficits Psych: Normal affect and mood  ASSESSMENT/PLAN:   No problem-specific Assessment & Plan notes found for this encounter.     Dr. Jonne Netters, DO [place] [facility]    {    This will disappear when note is signed, click to select method of visit    :1}

## 2024-01-25 NOTE — Progress Notes (Unsigned)
    SUBJECTIVE:   CHIEF COMPLAINT / HPI:   ED f/u Seen in ED on 4/24 for R leg pain, diagnosed with IT band syndrome.  Hypertension: - Medications: hydrochlorothiazide  25mg , Losartan  25mg , Metoprolol  12.5mg  BID - Compliance: *** - Checking BP at home: *** - Denies any SOB, CP, vision changes, LE edema, medication SEs, or symptoms of hypotension - Diet: *** - Exercise: ***   PERTINENT  PMH / PSH: ***  OBJECTIVE:   There were no vitals taken for this visit. ***  General: NAD, pleasant, able to participate in exam Cardiac: RRR, no murmurs. Respiratory: CTAB, normal effort, No wheezes, rales or rhonchi Abdomen: Bowel sounds present, nontender, nondistended Extremities: no edema or cyanosis. Skin: warm and dry, no rashes noted Neuro: alert, no obvious focal deficits Psych: Normal affect and mood  ASSESSMENT/PLAN:   No problem-specific Assessment & Plan notes found for this encounter.     Dr. Jonne Netters, DO [facility] [facility]    {    This will disappear when note is signed, click to select method of visit    :1}

## 2024-01-26 ENCOUNTER — Ambulatory Visit (INDEPENDENT_AMBULATORY_CARE_PROVIDER_SITE_OTHER): Admitting: Family Medicine

## 2024-01-26 VITALS — BP 142/70 | HR 68 | Wt 186.0 lb

## 2024-01-26 DIAGNOSIS — G8929 Other chronic pain: Secondary | ICD-10-CM

## 2024-01-26 DIAGNOSIS — I10 Essential (primary) hypertension: Secondary | ICD-10-CM | POA: Diagnosis not present

## 2024-01-26 DIAGNOSIS — M25561 Pain in right knee: Secondary | ICD-10-CM | POA: Diagnosis not present

## 2024-01-26 DIAGNOSIS — G4700 Insomnia, unspecified: Secondary | ICD-10-CM | POA: Diagnosis not present

## 2024-01-26 DIAGNOSIS — E119 Type 2 diabetes mellitus without complications: Secondary | ICD-10-CM | POA: Diagnosis not present

## 2024-01-26 MED ORDER — OLMESARTAN MEDOXOMIL-HCTZ 40-25 MG PO TABS: 1.0000 | ORAL_TABLET | Freq: Every day | ORAL | 1 refills | Status: DC

## 2024-01-26 NOTE — Assessment & Plan Note (Signed)
 Taking melatonin 10 mg nightly without effect, previously prescribed trazodone  by pulmonology but does not wish to take it due to possible side effect profile.  Discussed effective sleep hygiene, patient would like to try lifestyle changes for management.

## 2024-01-26 NOTE — Assessment & Plan Note (Signed)
 Declined A1c today, will follow-up in the next month to obtain.

## 2024-01-26 NOTE — Assessment & Plan Note (Signed)
 142/70 upon repeat.  Per chart review has been persistently elevated in recent months on HCTZ 25 mg and losartan  25 mg daily.  Will switch to combo pill. -Start HCTZ-olmesartan 25-40 mg daily -Stop HCTZ and losartan 

## 2024-01-26 NOTE — Patient Instructions (Signed)
 It was wonderful to see you today! Thank you for choosing [facility].   Please bring ALL of your medications with you to every visit.   Today we talked about:  For your insomnia if you do not want to take the trazodone  you can consider trying Unisom which is over-the-counter.  It is safe enough that we recommend it for pregnant women but you can do your research on if it is right for you.  As we discussed please optimize your sleep setting including sleeping in a cool dark place at the same time every night.  Please limit your caffeine intake after noon and avoid screens 30 minutes to an hour before bedtime. We are switching her blood pressure medication to a combination pill.  Please just take the pill 1 tablet 1 time per day and you do not need to take the hydrochlorothiazide  or losartan . For your leg pain as we discussed it is likely secondary to your knee arthritis.  If the pain returns you can consider using Voltaren  gel over the area as this is a topical anti-inflammatory. I would like to get your A1c at your follow-up appointment this month.  Please follow up in 1 month for blood pressure check  Call the clinic at [phone] if your symptoms worsen or you have any concerns.  Please be sure to schedule follow up at the front desk before you leave today.   Jonne Netters, DO Family Medicine

## 2024-01-26 NOTE — Assessment & Plan Note (Signed)
 Likely secondary to OA given h/o meniscal repair right knee.  Only having intermittent pain, will treat symptomatically with ice and Voltaren  gel as needed.

## 2024-02-01 NOTE — Progress Notes (Deleted)
 Cardiology Office Note    Patient Name: Mariah [name] Date of Encounter: 02/01/2024  Primary Care Provider:  Jonne Netters, MD Primary Cardiologist:  Mariah Batman, MD Primary Electrophysiologist: None   Past Medical History    Past Medical History:  Diagnosis Date   Cramp of muscle of left upper extremity 06/24/2019   Decreased peripheral vision of left eye 08/19/2019   Degenerative disc disease, lumbar    Ear pain 11/21/2019   Elevated hemoglobin A1c 10/26/2019   Elevated hemoglobin A1c 11/23/2019   GERD (gastroesophageal reflux disease)    Hypertension    Insomnia 07/11/2010   Qualifier: Diagnosis of  By: Mariah Silver MD, Mariah [name]     Mitral valve prolapse    Nasal congestion 04/01/2019   Numbness and tingling of right hand 01/29/2017   Pain and swelling of right knee 12/29/2019   Postprandial bloating 04/01/2019   Pre-diabetes    Prediabetes 10/24/2016   Shortness of breath 11/21/2019   TIA (transient ischemic attack)     History of Present Illness  Mariah [name] is a 63 y.o. female with a PMH of CVA, PAD s/p BMS to left superior femoral artery, MVR, HTN, HLD, DM type II, PVCs who presents today for 59-month follow-up.  [name] was last seen on 11/05/2023 for hospital follow-up.  She completed left heart cath which showed nonobstructive disease.  During her visit she endorsed no chest pain and was compliant with her medications.  She was interested in lifestyle modifications to help control cholesterol was referred to our health coach.  She was seen by pulmonology and evaluated for sleep apnea and was found to be negative for OSA.  Patient denies chest pain, palpitations, dyspnea, PND, orthopnea, nausea, vomiting, dizziness, syncope, edema, weight gain, or early satiety.   Discussed the use of AI scribe software for clinical note transcription with the patient, who gave verbal consent to proceed.  History of Present Illness    ***Notes:   Review  of Systems  Please see the history of present illness.    All other systems reviewed and are otherwise negative except as noted above.  Physical Exam     Wt Readings from Last 3 Encounters:  01/26/24 186 lb (84.4 kg)  01/07/24 188 lb (85.3 kg)  11/30/23 190 lb 6.4 oz (86.4 kg)   ZO:[id number] were no vitals filed for this visit.,There is no height or weight on file to calculate BMI. GEN: Well nourished, well developed in no acute distress Neck: No JVD; No carotid bruits Pulmonary: Clear to auscultation without rales, wheezing or rhonchi  Cardiovascular: Normal rate. Regular rhythm. Normal S1. Normal S2.   Murmurs: There is no murmur.  ABDOMEN: Soft, non-tender, non-distended EXTREMITIES:  No edema; No deformity   EKG/LABS/ Recent Cardiac Studies   ECG personally reviewed by me today - ***  Risk Assessment/Calculations:   {Does this patient have ATRIAL FIBRILLATION?[phone]}      Lab Results  Component Value Date   WBC 6.7 10/15/2023   HGB 12.2 10/15/2023   HCT 37.4 10/15/2023   MCV 84.0 10/15/2023   PLT 270 10/15/2023   Lab Results  Component Value Date   CREATININE 0.71 10/15/2023   BUN 13 10/15/2023   NA 136 10/15/2023   K 3.9 10/15/2023   CL 107 10/15/2023   CO2 23 10/15/2023   Lab Results  Component Value Date   CHOL 149 10/14/2023   HDL 42 10/14/2023   LDLCALC 32 10/14/2023   TRIG 374 (H) 10/14/2023  CHOLHDL 3.5 10/14/2023    Lab Results  Component Value Date   HGBA1C 7.0 (H) 10/14/2023   Assessment & Plan    Assessment and Plan Assessment & Plan     1.Nonobstructive CAD: -LHC performed on 10/14/2023 that showed mild nonobstructive disease with normal LVEDP -2D echo was completed showing normal EF of 55 to 60% with mild ventricular hypertrophy and grade 2 DD severely dilated LA and moderate leaflet thickening of the MV with mild MVR -Today patient reports   2.  Essential hypertension  3.  Hyperlipidemia  4.  Nonrheumatic MVR  5.  DM type  II:      Disposition: Follow-up with Mariah Batman, MD or APP in *** months {Are you ordering a CV Procedure (e.g. stress test, cath, DCCV, TEE, etc)?   Press F2        :[id number]}   Signed, Mariah [name], Mariah Cast, NP 02/01/2024, 7:28 PM [facility]

## 2024-02-02 ENCOUNTER — Encounter (INDEPENDENT_AMBULATORY_CARE_PROVIDER_SITE_OTHER): Payer: Self-pay

## 2024-02-02 ENCOUNTER — Ambulatory Visit: Payer: Medicaid Other | Admitting: Nurse Practitioner

## 2024-03-24 ENCOUNTER — Telehealth: Payer: Self-pay

## 2024-03-24 NOTE — Telephone Encounter (Signed)
 Patient contacted for follow-up of question RE lemongrass or hibiscus tea and effects on her medication  These medications may have mild effects on blood pressure.   Discussed with patient if she noticed any symptoms of low blood pressure, dizziness or light-headed feeling she should contact our office.  Additionally, she was advised if she saw low blood pressure numbers systolic < 100 mm Hg she should share that with us .   Last blood pressures measured were above goal of < 130/80 mmHg  Medication Plan: - No change in medications.   Instructed to proceed with her daily (limited) consumption of these teas  Total time with patient call and documentation of interaction: 11 minutes.  She thanked me for returning her call.

## 2024-03-24 NOTE — Telephone Encounter (Signed)
 Received call from patient asking if it would be safe for her to drink lemon grass tea and hibiscus tea with her current medications.   She is wanting to make sure that there would not be any interactions between these.   Will forward to PCP and Dr. Koval for further guidance.   Chiquita JAYSON English, RN

## 2024-03-25 NOTE — Telephone Encounter (Signed)
 Reviewed and agree with Dr Macky Lower plan.

## 2024-03-27 ENCOUNTER — Other Ambulatory Visit: Payer: Self-pay | Admitting: Family Medicine

## 2024-03-27 DIAGNOSIS — I10 Essential (primary) hypertension: Secondary | ICD-10-CM

## 2024-03-31 ENCOUNTER — Ambulatory Visit: Admitting: Nurse Practitioner

## 2024-05-02 NOTE — Progress Notes (Unsigned)
 Cardiology Office Note    Patient Name: Mariah [name] [name] Date of Encounter: 05/02/2024  Primary Care Provider:  Theophilus Pagan, MD Primary Cardiologist:  Mariah Cash, MD Primary Electrophysiologist: None   Past Medical History    Past Medical History:  Diagnosis Date   Cramp of muscle of left upper extremity 06/24/2019   Decreased peripheral vision of left eye 08/19/2019   Degenerative disc disease, lumbar    Ear pain 11/21/2019   Elevated hemoglobin A1c 10/26/2019   Elevated hemoglobin A1c 11/23/2019   GERD (gastroesophageal reflux disease)    Hypertension    Insomnia 07/11/2010   Qualifier: Diagnosis of  By: Zackary MD, [facility]     Mitral valve prolapse    Nasal congestion 04/01/2019   Numbness and tingling of right hand 01/29/2017   Pain and swelling of right knee 12/29/2019   Postprandial bloating 04/01/2019   Pre-diabetes    Prediabetes 10/24/2016   Shortness of breath 11/21/2019   TIA (transient ischemic attack)     History of Present Illness  Mariah [name] [name] is a 63 y.o. female with a PMH of CVA, PAD s/p BMS to left superior femoral artery, MVR, HTN, HLD, DM type II, PVCs who presents today for 71-month follow-up.   [name] was last seen on 11/05/2023 for hospital follow-up.  She completed left heart cath which showed nonobstructive disease.  During her visit she endorsed no chest pain and was compliant with her medications.  She was interested in lifestyle modifications to help control cholesterol was referred to our health coach.  She was seen by pulmonology and evaluated for sleep apnea and was found to be negative for OSA.  Patient denies chest pain, palpitations, dyspnea, PND, orthopnea, nausea, vomiting, dizziness, syncope, edema, weight gain, or early satiety.   Discussed the use of AI scribe software for clinical note transcription with the patient, who gave verbal consent to proceed.  History of Present Illness    ***Notes:   Review  of Systems  Please see the history of present illness.    All other systems reviewed and are otherwise negative except as noted above.  Physical Exam    Wt Readings from Last 3 Encounters:  01/26/24 186 lb (84.4 kg)  01/07/24 188 lb (85.3 kg)  11/30/23 190 lb 6.4 oz (86.4 kg)   CD:Uyzmz were no vitals filed for this visit.,There is no height or weight on file to calculate BMI. GEN: Well nourished, well developed in no acute distress Neck: No JVD; No carotid bruits Pulmonary: Clear to auscultation without rales, wheezing or rhonchi  Cardiovascular: Normal rate. Regular rhythm. Normal S1. Normal S2.   Murmurs: There is no murmur.  ABDOMEN: Soft, non-tender, non-distended EXTREMITIES:  No edema; No deformity   EKG/LABS/ Recent Cardiac Studies   ECG personally reviewed by me today - ***  Risk Assessment/Calculations:   {Does this patient have ATRIAL FIBRILLATION?:[phone]}      Lab Results  Component Value Date   WBC 6.7 10/15/2023   HGB 12.2 10/15/2023   HCT 37.4 10/15/2023   MCV 84.0 10/15/2023   PLT 270 10/15/2023   Lab Results  Component Value Date   CREATININE 0.71 10/15/2023   BUN 13 10/15/2023   NA 136 10/15/2023   K 3.9 10/15/2023   CL 107 10/15/2023   CO2 23 10/15/2023   Lab Results  Component Value Date   CHOL 149 10/14/2023   HDL 42 10/14/2023   LDLCALC 32 10/14/2023   TRIG 374 (H) 10/14/2023  CHOLHDL 3.5 10/14/2023    Lab Results  Component Value Date   HGBA1C 7.0 (H) 10/14/2023   Assessment & Plan    Assessment and Plan Assessment & Plan   1.Nonobstructive CAD: -LHC performed on 10/14/2023 that showed mild nonobstructive disease with normal LVEDP -2D echo was completed showing normal EF of 55 to 60% with mild ventricular hypertrophy and grade 2 DD severely dilated LA and moderate leaflet thickening of the MV with mild MVR -Today patient reports    2.  Essential hypertension   3.  Hyperlipidemia   4.  Nonrheumatic MVR   5.  DM type  II:      Disposition: Follow-up with Mariah Cash, MD or APP in *** months {Are you ordering a CV Procedure (e.g. stress test, cath, DCCV, TEE, etc)?   Press F2        :[id number]}   Signed, Mariah [name] [name], Mariah [name] Shove, NP 05/02/2024, 1:17 PM [facility]

## 2024-05-03 ENCOUNTER — Encounter: Payer: Self-pay | Admitting: Nurse Practitioner

## 2024-05-03 ENCOUNTER — Ambulatory Visit: Attending: Nurse Practitioner | Admitting: Nurse Practitioner

## 2024-05-03 VITALS — BP 118/68 | HR 72 | Ht 66.0 in | Wt 187.0 lb

## 2024-05-03 DIAGNOSIS — I10 Essential (primary) hypertension: Secondary | ICD-10-CM | POA: Diagnosis not present

## 2024-05-03 DIAGNOSIS — E1169 Type 2 diabetes mellitus with other specified complication: Secondary | ICD-10-CM | POA: Insufficient documentation

## 2024-05-03 DIAGNOSIS — I341 Nonrheumatic mitral (valve) prolapse: Secondary | ICD-10-CM | POA: Insufficient documentation

## 2024-05-03 DIAGNOSIS — E785 Hyperlipidemia, unspecified: Secondary | ICD-10-CM | POA: Diagnosis not present

## 2024-05-03 DIAGNOSIS — I2510 Atherosclerotic heart disease of native coronary artery without angina pectoris: Secondary | ICD-10-CM | POA: Diagnosis not present

## 2024-05-03 NOTE — Patient Instructions (Addendum)
 Medication Instructions:  Your physician recommends that you continue on your current medications as directed. Please refer to the Current Medication list given to you today. *If you need a refill on your cardiac medications before your next appointment, please call your pharmacy*  Lab Work: TODAY-LIPIDS & LFTS If you have labs (blood work) drawn today and your tests are completely normal, you will receive your results only by: MyChart Message (if you have MyChart) OR A paper copy in the mail If you have any lab test that is abnormal or we need to change your treatment, we will call you to review the results.  Testing/Procedures: None Ordered  Follow-Up: At [facility], you and your health needs are our priority.  As part of our continuing mission to provide you with exceptional heart care, our providers are all part of one team.  This team includes your primary Cardiologist (physician) and Advanced Practice Providers or APPs (Physician Assistants and Nurse Practitioners) who all work together to provide you with the care you need, when you need it.  Your next appointment:   6 month(s)  Provider:   Lonni Cash, MD    We recommend signing up for the patient portal called MyChart.  Sign up information is provided on this After Visit Summary.  MyChart is used to connect with patients for Virtual Visits (Telemedicine).  Patients are able to view lab/test results, encounter notes, upcoming appointments, etc.  Non-urgent messages can be sent to your provider as well.   To learn more about what you can do with MyChart, go to [email or web address].   Other Instructions You have been referred to PHARM-D (PCSK9)

## 2024-05-10 NOTE — Progress Notes (Unsigned)
    SUBJECTIVE:   CHIEF COMPLAINT / HPI:   Right hand pain Right middle finger is popping painfully.  Reports when she tries to move her finger up and down she feels like it catches.  Has not been locking in place completely but feels uncomfortable.  T2DM Seeing a well care nutritionist, starting Sept. Down 6 pounds in the past 6 months.  Feels good overall and has been trying to manage her diabetes and cholesterol with lifestyle changes.  Bump on the back of her head Unsure of how long it has been present, notices it when she is trying to do her hair at times.  States it is nonpainful and not itchy.  Wondering if it needs to be removed.  Chronic bilateral shoulder pain R rotatr cuff needs replacement, L rotater cuff needs to be fixed.  Would like referral to [facility] orthopedics with Dr. Erwin to discuss further.  Hypertension: - Medications: Benicar  40-25mg  daily, Metoprolol  12.5mg  BID. - Compliance: Yes - Denies any SOB, CP, vision changes, LE edema, medication SEs, or symptoms of hypotension  PERTINENT  PMH / PSH: CAD, HTN, GERD, T2DM   OBJECTIVE:   BP (!) 138/56   Pulse 60   Ht 5' 6 (1.676 m)   Wt 186 lb 9.6 oz (84.6 kg)   SpO2 100%   BMI 30.12 kg/m   General: NAD, pleasant, able to participate in exam Cardiac: RRR, no murmurs. Respiratory: CTAB, normal effort, No wheezes, rales or rhonchi Extremities: no edema or cyanosis. Skin: warm and dry. ~61mm subcutaneous nodule on central occipital region without surrounding skin changes or erythema.  Firm, discrete, mobile. MSK: Right hand -Mild edema of right middle finger when compared to other digits.  No surrounding erythema or ecchymosis -Noticeable popping with flexion extension of right middle digit. -Normal grip strength and range of motion Neuro: alert, no obvious focal deficits Psych: Normal affect and mood  ASSESSMENT/PLAN:   Assessment & Plan Type 2 diabetes mellitus without complication, without long-term  current use of insulin  (HCC) A1c 6.7, at goal with lifestyle management.  Will repeat A1c in 6 months. -Lipid panel, LFTs per cardiology Trigger middle finger of right hand Discussed conservative therapy versus injection, patient opted for conservative approach.  Will trial supportive care with Tylenol  and ice and return in 2 weeks for injection if not improved. Chronic pain of both shoulders Unfortunately has bilateral rotator cuff injury, will refer to orthopedics at patient request to discuss surgical management. -Referral to orthopedics Subcutaneous nodule of head Small, likely benign subcutaneous cyst and given location on posterior scalp advised monitoring.  Will consider referral for removal if enlarging or becomes painful.   Dr. Izetta Nap, DO [facility]

## 2024-05-11 ENCOUNTER — Ambulatory Visit (INDEPENDENT_AMBULATORY_CARE_PROVIDER_SITE_OTHER): Admitting: Family Medicine

## 2024-05-11 VITALS — BP 138/56 | HR 60 | Ht 66.0 in | Wt 186.6 lb

## 2024-05-11 DIAGNOSIS — E119 Type 2 diabetes mellitus without complications: Secondary | ICD-10-CM | POA: Diagnosis not present

## 2024-05-11 DIAGNOSIS — M25511 Pain in right shoulder: Secondary | ICD-10-CM | POA: Diagnosis not present

## 2024-05-11 DIAGNOSIS — G8929 Other chronic pain: Secondary | ICD-10-CM | POA: Diagnosis not present

## 2024-05-11 DIAGNOSIS — R220 Localized swelling, mass and lump, head: Secondary | ICD-10-CM

## 2024-05-11 DIAGNOSIS — M25512 Pain in left shoulder: Secondary | ICD-10-CM | POA: Diagnosis not present

## 2024-05-11 DIAGNOSIS — M65331 Trigger finger, right middle finger: Secondary | ICD-10-CM | POA: Diagnosis not present

## 2024-05-11 LAB — POCT GLYCOSYLATED HEMOGLOBIN (HGB A1C): HbA1c, POC (controlled diabetic range): 6.7 % (ref 0.0–7.0)

## 2024-05-11 NOTE — Patient Instructions (Signed)
 It was wonderful to see you today! Thank you for choosing [facility].   Please bring ALL of your medications with you to every visit.   Today we talked about:  Your A1c is 6.7! Great work with your diet and exercise.  We can repeat it in 6 months and I agree that continued weight loss will likely improve it even further. The bump on the back of your head looks like it is likely a cyst of some sort that is under the skin.  Given the depth of it it would require more extensive removal given its on the scalp.  I do think since is not causing you any trouble and looks benign that we can leave it alone for now and see if it changes at all. I did place a referral for your shoulders to Dr. Erwin at [facility], please follow-up with them for continued care. For your right hand pain you do have a trigger finger of your right middle finger.  This is when the tendon catches and can become uncomfortable.  I included some information about it as well.  Please try icing the area and buddy taping for the next 1 to 2 weeks in addition to taking Tylenol  or using Voltaren  gel for discomfort.  If this does not improve it we can consider a trigger point injection to help with your symptoms.  Please follow up in 3 months or sooner to discuss your other concerns.  If you haven't already, sign up for My Chart to have easy access to your labs results, and communication with your primary care physician.   We are checking some labs today. If they are abnormal, I will call you. If they are normal, I will send you a MyChart message (if it is active) or a letter in the mail. If you do not hear about your labs in the next 2 weeks, please call the office.  Call the clinic at [phone] if your symptoms worsen or you have any concerns.  Please be sure to schedule follow up at the front desk before you leave today.   Izetta Nap, DO Family Medicine

## 2024-05-12 LAB — HEPATIC FUNCTION PANEL
ALT: 17 IU/L (ref 0–32)
AST: 19 IU/L (ref 0–40)
Albumin: 4.6 g/dL (ref 3.9–4.9)
Alkaline Phosphatase: 105 IU/L (ref 44–121)
Bilirubin Total: 0.8 mg/dL (ref 0.0–1.2)
Bilirubin, Direct: 0.26 mg/dL (ref 0.00–0.40)
Total Protein: 7.1 g/dL (ref 6.0–8.5)

## 2024-05-12 LAB — LIPID PANEL
Chol/HDL Ratio: 3.2 ratio (ref 0.0–4.4)
Cholesterol, Total: 152 mg/dL (ref 100–199)
HDL: 47 mg/dL (ref >39)
LDL Chol Calc (NIH): 86 mg/dL (ref 0–99)
Triglycerides: 105 mg/dL (ref 0–149)
VLDL Cholesterol Cal: 19 mg/dL (ref 5–40)

## 2024-05-13 ENCOUNTER — Ambulatory Visit: Payer: Self-pay | Admitting: Family Medicine

## 2024-05-13 NOTE — Assessment & Plan Note (Signed)
 A1c 6.7, at goal with lifestyle management.  Will repeat A1c in 6 months. -Lipid panel, LFTs per cardiology

## 2024-05-13 NOTE — Assessment & Plan Note (Signed)
 Unfortunately has bilateral rotator cuff injury, will refer to orthopedics at patient request to discuss surgical management. -Referral to orthopedics

## 2024-05-17 ENCOUNTER — Other Ambulatory Visit: Payer: Self-pay | Admitting: Cardiology

## 2024-06-03 NOTE — Progress Notes (Deleted)
    SUBJECTIVE:   CHIEF COMPLAINT / HPI:   Trigger finger Right middle finger. Trialed conservative therapy at last visit.  Gluteal rash?  PERTINENT  PMH / PSH: ***  OBJECTIVE:   There were no vitals taken for this visit. ***  General: NAD, pleasant, able to participate in exam Cardiac: RRR, no murmurs. Respiratory: CTAB, normal effort, No wheezes, rales or rhonchi Abdomen: Bowel sounds present, nontender, nondistended Extremities: no edema or cyanosis. Skin: warm and dry, no rashes noted Neuro: alert, no obvious focal deficits Psych: Normal affect and mood  ASSESSMENT/PLAN:   No problem-specific Assessment & Plan notes found for this encounter.     Dr. Izetta Nap, DO [facility]    {    This will disappear when note is signed, click to select method of visit    :1}

## 2024-06-08 ENCOUNTER — Ambulatory Visit: Admitting: Family Medicine

## 2024-06-15 ENCOUNTER — Ambulatory Visit: Admitting: Orthopedic Surgery

## 2024-06-15 ENCOUNTER — Other Ambulatory Visit (INDEPENDENT_AMBULATORY_CARE_PROVIDER_SITE_OTHER): Payer: Self-pay

## 2024-06-15 DIAGNOSIS — M79641 Pain in right hand: Secondary | ICD-10-CM

## 2024-06-15 DIAGNOSIS — M25511 Pain in right shoulder: Secondary | ICD-10-CM

## 2024-06-15 DIAGNOSIS — G8929 Other chronic pain: Secondary | ICD-10-CM

## 2024-06-15 DIAGNOSIS — M25512 Pain in left shoulder: Secondary | ICD-10-CM | POA: Diagnosis not present

## 2024-06-15 DIAGNOSIS — M65331 Trigger finger, right middle finger: Secondary | ICD-10-CM | POA: Diagnosis not present

## 2024-06-15 MED ORDER — LIDOCAINE HCL 1 % IJ SOLN
1.0000 mL | INTRAMUSCULAR | Status: AC | PRN
  Administered 2024-06-15: 1 mL

## 2024-06-15 MED ORDER — BETAMETHASONE SOD PHOS & ACET 6 (3-3) MG/ML IJ SUSP
6.0000 mg | INTRAMUSCULAR | Status: AC | PRN
  Administered 2024-06-15: 6 mg via INTRA_ARTICULAR

## 2024-06-15 NOTE — Progress Notes (Signed)
 Mariah [name] - 63 y.o. female MRN [id number]  Date of birth: 04-20-1961  Office Visit Note: Visit Date: 06/15/2024 PCP: Theophilus Pagan, MD Referred by: Donzetta Rollene BRAVO, MD  Subjective: No chief complaint on file.  HPI: Mariah [name] is a pleasant 63 y.o. female who presents today for right long finger trigger digit.  She is having ongoing clicking and catching of the digit on regular basis, often requiring manual correction.  No prior treatment modalities have been tried.   Pertinent ROS were reviewed with the patient and found to be negative unless otherwise specified above in HPI.   Visit Reason: right long finger trigger Duration of symptoms: 3+ months Hand dominance: right Occupation:Disabled- door dash Diabetic: Yes/ 6.7 Smoking: yes/ marijuana Heart/Lung History: no Blood Thinners: aspirin   Prior Testing/EMG: none Injections (Date): none Treatments: none Prior Surgery: none    Assessment & Plan: Visit Diagnoses:  1. Trigger finger, right middle finger   2. Right hand pain   3. Chronic pain of both shoulders     Plan: Extensive discussion was had with the patient today regarding her right long finger trigger digit.  We discussed the etiology and pathophysiology of stenosing tenosynovitis.  We discussed conservative versus surgical treatment modalities.  From a conservative standpoint, we discussed activity modification, splinting, therapy and injections.  From a surgical standpoint, we discussed the possibility for trigger digit release as well as all risk and benefits associated.  Given that she has not trialed conservative treatments, patient is appropriate candidate for cortisone injection to the right long finger A1 pulley for symptom relief.  Risks and benefit of the cortisone injection were discussed in detail, patient agreed to proceed.  Injection was provided today without issue, patient will return in approximate 6 weeks time for a  recheck.   Follow-up: No follow-ups on file.   Meds & Orders: No orders of the defined types were placed in this encounter.   Orders Placed This Encounter  Procedures   Hand/UE Inj   XR Hand Complete Right   Ambulatory referral to Orthopedic Surgery   Ambulatory referral to Occupational Therapy     Procedures: Hand/UE Inj: R long A1 for trigger finger on 06/15/2024 11:59 AM Indications: pain Details: 25 G needle, volar approach Medications: 1 mL lidocaine  1 %; 6 mg betamethasone acetate-betamethasone sodium phosphate 6 (3-3) MG/ML Outcome: tolerated well, no immediate complications Consent was given by the patient. Patient was prepped and draped in the usual sterile fashion.          Clinical History: No specialty comments available.  She reports that she quit smoking about 28 years ago. Her smoking use included cigarettes. She has been exposed to tobacco smoke. She has never used smokeless tobacco.  Recent Labs    10/14/23 0125 05/11/24 1115  HGBA1C 7.0* 6.7    Objective:   Vital Signs: There were no vitals taken for this visit.  Physical Exam  Gen: Well-appearing, in no acute distress; non-toxic CV: Regular Rate. Well-perfused. Warm.  Resp: Breathing unlabored on room air; no wheezing. Psych: Fluid speech in conversation; appropriate affect; normal thought process  Ortho Exam Right hand: - Palpable nodule at the A1 pulley of the long finger, associated tenderness - Notable clicking with deep flexion of the long finger, there is evidence of significant locking with deep flexion - Sensation intact distally, hand remains warm well-perfused   Imaging: XR Hand Complete Right Result Date: 06/15/2024 X-rays of the right hand, multiple views obtained today.  No evidence of fracture or dislocation.  There is mild widening of the scapholunate interval, no evidence of the DISI deformity on lateral view.   Past Medical/Family/Surgical/Social History: Medications &  Allergies reviewed per EMR, new medications updated. Patient Active Problem List   Diagnosis Date Noted   NSTEMI (non-ST elevated myocardial infarction) (HCC) 10/14/2023   Trapezius muscle strain, right, initial encounter 03/21/2022   Left shoulder tendinitis 02/25/2021   Chronic pain of both shoulders 01/10/2021   Functional dyspepsia 01/10/2021   Chronic pain of right knee 12/29/2019   Atherosclerosis of native arteries of extremity with intermittent claudication 11/04/2019   Nasal turbinate hypertrophy 07/07/2019   Eczema 02/11/2018   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   Hyperlipidemia 06/26/2017   TIA (transient ischemic attack) 06/23/2017   Health care maintenance 10/24/2016   T2DM (type 2 diabetes mellitus) (HCC) 11/02/2015   Status post cervical spinal fusion 04/27/2015   Cervical myelopathy (HCC) 12/22/2014   Benign paroxysmal positional vertigo 10/06/2013   Amaurosis fugax 08/16/2013   Plantar wart 02/22/2013   Degenerative disc disease, lumbar    Mitral valve prolapse    Insomnia 07/11/2010   OBESITY 02/13/2009   Benign essential hypertension 08/07/2008   Anxiety 02/26/2007   Allergic rhinitis 02/26/2007   GERD 02/26/2007   Past Medical History:  Diagnosis Date   Cramp of muscle of left upper extremity 06/24/2019   Decreased peripheral vision of left eye 08/19/2019   Degenerative disc disease, lumbar    Ear pain 11/21/2019   Elevated hemoglobin A1c 10/26/2019   Elevated hemoglobin A1c 11/23/2019   GERD (gastroesophageal reflux disease)    Hypertension    Insomnia 07/11/2010   Qualifier: Diagnosis of  By: Zackary MD, [facility]     Mitral valve prolapse    Nasal congestion 04/01/2019   Numbness and tingling of right hand 01/29/2017   Pain and swelling of right knee 12/29/2019   Postprandial bloating 04/01/2019   Pre-diabetes    Prediabetes 10/24/2016   Shortness of breath 11/21/2019   TIA (transient ischemic attack)    Family History  Problem  Relation Age of Onset   Lymphoma Mother    Lung cancer Father    Pulmonary embolism Brother    Colon cancer Neg Hx    Esophageal cancer Neg Hx    Liver cancer Neg Hx    Pancreatic cancer Neg Hx    Rectal cancer Neg Hx    Stomach cancer Neg Hx    Past Surgical History:  Procedure Laterality Date   ABDOMINAL HYSTERECTOMY     ABDOMINAL HYSTERECTOMY     BACK SURGERY     CHOLECYSTECTOMY     JOINT REPLACEMENT     KNEE SURGERY     LEFT HEART CATH AND CORONARY ANGIOGRAPHY N/A 10/14/2023   Procedure: LEFT HEART CATH AND CORONARY ANGIOGRAPHY;  Surgeon: Swaziland, Peter M, MD;  Location: [facility] INVASIVE CV LAB;  Service: Cardiovascular;  Laterality: N/A;   LOWER EXTREMITY ANGIOGRAPHY Left 06/10/2022   Procedure: Lower Extremity Angiography;  Surgeon: Jama Cordella MATSU, MD;  Location: [facility] INVASIVE CV LAB;  Service: Cardiovascular;  Laterality: Left;   Social History   Occupational History   Not on file  Tobacco Use   Smoking status: Former    Current packs/day: 0.00    Types: Cigarettes    Quit date: 09/16/1995    Years since quitting: 28.7    Passive exposure: Past   Smokeless tobacco: Never  Substance and Sexual Activity   Alcohol  use: Yes    Alcohol/week: 5.0 standard drinks of alcohol    Types: 5 Cans of beer per week   Drug use: Yes    Types: Marijuana   Sexual activity: Yes    Birth control/protection: Condom    Dreya Buhrman Estela) Arlinda, M.D. [facility], Hand Surgery

## 2024-06-16 ENCOUNTER — Ambulatory Visit
Attending: Pharmacist Clinician (PhC)/ Clinical Pharmacy Specialist | Admitting: Pharmacist Clinician (PhC)/ Clinical Pharmacy Specialist

## 2024-06-16 NOTE — Progress Notes (Deleted)
 Office Visit    Patient Name: Mariah [name] Date of Encounter: 06/16/2024  Primary Care Provider:  Theophilus Pagan, MD Primary Cardiologist:  Lonni Cash, MD  Chief Complaint    Hyperlipidemia   Significant Past Medical History   CAD 1/25 cath w/ 50% stenosis of 1st Diag  PAD 2021 BMS to L SFA  TIA 2018  DM2 8/25 A1c 7.0  HTN Controlled on olmesartan  hct, metoprolol       Allergies  Allergen Reactions   Lisinopril     REACTION: COUGH   Moxifloxacin     REACTION: N \\T \ V   Sulfa Antibiotics     Other reaction(s): Other (See Comments), Other (See Comments) Pt cannot remember rxn Pt cannot remember rxn    Sulfonamide Derivatives Other (See Comments)    Pt cannot remember rxn    History of Present Illness    Mariah [name] is a 63 y.o. female patient of Dr Georgeann, in the office today to discuss options for cholesterol management.  She was most recently seen by Jackee Alberts NP in August and had concerns for statin myalgias as well as elevated lipid readings.    Insurance Carrier:  [employer] Medicaid  Pharmacy:     LDL Cholesterol goal:  LDL < 55  Current Medications:  atorvastatin  40 mg daily   Previously tried:    Family Hx:     Social Hx: Tobacco: Alcohol:      Diet:      Exercise:   Adherence Assessment  Do you ever forget to take your medication? [] Yes [] No  Do you ever skip doses due to side effects? [] Yes [] No  Do you have trouble affording your medicines? [] Yes [] No  Are you ever unable to pick up your medication due to transportation difficulties? [] Yes [] No  Do you ever stop taking your medications because you don't believe they are helping? [] Yes [] No  Do you check your weight daily? [] Yes [] No   Adherence strategy: ***  Barriers to obtaining medications: ***     Accessory Clinical Findings   Lab Results  Component Value Date   CHOL 152 05/11/2024   HDL 47 05/11/2024   LDLCALC 86 05/11/2024   TRIG 105  05/11/2024   CHOLHDL 3.2 05/11/2024    Lipoprotein (a)  Date/Time Value Ref Range Status  10/15/2023 03:42 AM 51.0 (H) <75.0 nmol/L Final    Comment:    (NOTE) Note:  Values greater than or equal to 75.0 nmol/L may       indicate an independent risk factor for CHD,       but must be evaluated with caution when applied       to non-Caucasian populations due to the       influence of genetic factors on Lp(a) across       ethnicities. Performed At: [facility] [address] [place], [place] [id number] Jennette Shorter MD Ey:[id number]     Lab Results  Component Value Date   ALT 17 05/11/2024   AST 19 05/11/2024   ALKPHOS 105 05/11/2024   BILITOT 0.8 05/11/2024   Lab Results  Component Value Date   CREATININE 0.71 10/15/2023   BUN 13 10/15/2023   NA 136 10/15/2023   K 3.9 10/15/2023   CL 107 10/15/2023   CO2 23 10/15/2023   Lab Results  Component Value Date   HGBA1C 6.7 05/11/2024    Home Medications    Current Outpatient Medications  Medication Sig Dispense Refill  acetaminophen  (TYLENOL ) 500 MG tablet Take 2 tablets (1,000 mg total) by mouth every 8 (eight) hours as needed.     aspirin  EC (ASPIRIN  LOW DOSE) 81 MG tablet TAKE 1 TABLET BY MOUTH DAILY, SWALLOW WHOLE 90 tablet 3   atorvastatin  (LIPITOR) 40 MG tablet Take 1 tablet (40 mg total) by mouth daily.     azelastine  (ASTELIN ) 0.1 % nasal spray 1 puff in each nostril Nasally Twice a day 30 mL 2   fluticasone  (FLONASE ) 50 MCG/ACT nasal spray INSTILL 2 SPRAY IN EACH NOSTRIL EVERY DAY 16 g 11   metoprolol  tartrate (LOPRESSOR ) 25 MG tablet TAKE 1/2 TABLET(12.5 MG) BY MOUTH TWICE DAILY 90 tablet 3   olmesartan -hydrochlorothiazide  (BENICAR  HCT) 40-25 MG tablet TAKE 1 TABLET BY MOUTH DAILY 90 tablet 1   omeprazole  (PRILOSEC) 40 MG capsule TAKE 1 CAPSULE(40 MG) BY MOUTH DAILY 90 capsule 3   sodium chloride  (OCEAN) 0.65 % nasal spray Place into the nose as needed for congestion.     No current  facility-administered medications for this visit.     Assessment & Plan    No problem-specific Assessment & Plan notes found for this encounter.   Lundon Rosier, PharmD CPP [facility] [address]   [place], [place] [address] [phone]  06/16/2024, 6:16 AM

## 2024-06-20 NOTE — Therapy (Incomplete)
 OUTPATIENT OCCUPATIONAL THERAPY ORTHO EVALUATION  Patient Name: Mariah [name] MRN: [id number] DOB:1961/04/08, 63 y.o., female Today's Date: 06/20/2024  PCP: Theophilus Pagan, MD REFERRING PROVIDER: Arlinda Buster, MD  END OF SESSION:   Past Medical History:  Diagnosis Date   Cramp of muscle of left upper extremity 06/24/2019   Decreased peripheral vision of left eye 08/19/2019   Degenerative disc disease, lumbar    Ear pain 11/21/2019   Elevated hemoglobin A1c 10/26/2019   Elevated hemoglobin A1c 11/23/2019   GERD (gastroesophageal reflux disease)    Hypertension    Insomnia 07/11/2010   Qualifier: Diagnosis of  By: Zackary MD, [facility]     Mitral valve prolapse    Nasal congestion 04/01/2019   Numbness and tingling of right hand 01/29/2017   Pain and swelling of right knee 12/29/2019   Postprandial bloating 04/01/2019   Pre-diabetes    Prediabetes 10/24/2016   Shortness of breath 11/21/2019   TIA (transient ischemic attack)    Past Surgical History:  Procedure Laterality Date   ABDOMINAL HYSTERECTOMY     ABDOMINAL HYSTERECTOMY     BACK SURGERY     CHOLECYSTECTOMY     JOINT REPLACEMENT     KNEE SURGERY     LEFT HEART CATH AND CORONARY ANGIOGRAPHY N/A 10/14/2023   Procedure: LEFT HEART CATH AND CORONARY ANGIOGRAPHY;  Surgeon: Swaziland, Peter M, MD;  Location: [facility] INVASIVE CV LAB;  Service: Cardiovascular;  Laterality: N/A;   LOWER EXTREMITY ANGIOGRAPHY Left 06/10/2022   Procedure: Lower Extremity Angiography;  Surgeon: Jama Cordella MATSU, MD;  Location: [facility] INVASIVE CV LAB;  Service: Cardiovascular;  Laterality: Left;   Patient Active Problem List   Diagnosis Date Noted   NSTEMI (non-ST elevated myocardial infarction) (HCC) 10/14/2023   Trapezius muscle strain, right, initial encounter 03/21/2022   Left shoulder tendinitis 02/25/2021   Chronic pain of both shoulders 01/10/2021   Functional dyspepsia 01/10/2021   Chronic pain of right knee 12/29/2019    Atherosclerosis of native arteries of extremity with intermittent claudication 11/04/2019   Nasal turbinate hypertrophy 07/07/2019   Eczema 02/11/2018   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   Hyperlipidemia 06/26/2017   TIA (transient ischemic attack) 06/23/2017   Health care maintenance 10/24/2016   T2DM (type 2 diabetes mellitus) (HCC) 11/02/2015   Status post cervical spinal fusion 04/27/2015   Cervical myelopathy (HCC) 12/22/2014   Benign paroxysmal positional vertigo 10/06/2013   Amaurosis fugax 08/16/2013   Plantar wart 02/22/2013   Degenerative disc disease, lumbar    Mitral valve prolapse    Insomnia 07/11/2010   OBESITY 02/13/2009   Benign essential hypertension 08/07/2008   Anxiety 02/26/2007   Allergic rhinitis 02/26/2007   GERD 02/26/2007    ONSET DATE: Referral date 06/15/24  REFERRING DIAG: M79.641 (ICD-10-CM) - Right hand pain R long finger trigger finger THERAPY DIAG:  No diagnosis found.  Rationale for Evaluation and Treatment: Rehabilitation  SUBJECTIVE:   SUBJECTIVE STATEMENT: *** Pt accompanied by: {accompnied:27141}  PERTINENT HISTORY:  Pt seen post ortho visit for R middle finger trigger finger on 06/15/24. Pt underwent cortisone injection to R long finger A1 pulley for symptom relief, educated in conservative tx and possibility of R trigger digit release. Other PMH includes: NSTEMI, R trapezius strain, L shoulder tendinitis, B shoulder chronic pain, TIA, DM2, R CTS, HTN  PRECAUTIONS: Other: diabetic, blood thinners  RED FLAGS: None   WEIGHT BEARING RESTRICTIONS: No  PAIN:  Are you having pain? {OPRCPAIN:27236}  FALLS: Has patient fallen in  last 6 months? 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  HAND DOMINANCE: {MISC; OT HAND DOMINANCE:[id number]}  ADLs: {ADLs OT:31716}  FUNCTIONAL OUTCOME MEASURES: {OTFUNCTIONALMEASURES:27238}  UPPER EXTREMITY ROM:     {AROM/PROM:27142} ROM Right eval Left eval  Shoulder flexion    Shoulder abduction    Shoulder adduction    Shoulder extension    Shoulder internal rotation    Shoulder external rotation    Elbow flexion    Elbow extension    Wrist flexion    Wrist extension    Wrist ulnar deviation    Wrist radial deviation    Wrist pronation    Wrist supination    (Blank rows = not tested)  {AROM/PROM:27142} ROM Right eval Left eval  Thumb MCP (0-60)    Thumb IP (0-80)    Thumb Radial abd/add (0-55)     Thumb Palmar abd/add (0-45)     Thumb Opposition to Small Finger     Index MCP (0-90)     Index PIP (0-100)     Index DIP (0-70)      Long MCP (0-90)      Long PIP (0-100)      Long DIP (0-70)      Ring MCP (0-90)      Ring PIP (0-100)      Ring DIP (0-70)      Little MCP (0-90)      Little PIP (0-100)      Little DIP (0-70)      (Blank rows = not tested)   UPPER EXTREMITY MMT:     MMT Right eval Left eval  Shoulder flexion    Shoulder abduction    Shoulder adduction    Shoulder extension    Shoulder internal rotation    Shoulder external rotation    Middle trapezius    Lower trapezius    Elbow flexion    Elbow extension    Wrist flexion    Wrist extension    Wrist ulnar deviation    Wrist radial deviation    Wrist pronation    Wrist supination    (Blank rows = not tested)  HAND FUNCTION: {handfunction:27230}  COORDINATION: {otcoordination:27237}  SENSATION: {sensation:27233}  EDEMA: ***  COGNITION: Overall cognitive status: {cognition:24006} Areas of impairment: {impairedcognition:27234}  OBSERVATIONS: ***   TREATMENT DATE: ***                                                                                                                             Modalities: {OPRCMODALITIES:31717}     PATIENT EDUCATION: Education details: *** Person educated: {Person educated:25204} Education method: {Education Method:25205} Education comprehension: {Education Comprehension:25206}  HOME EXERCISE PROGRAM: ***  GOALS: Goals reviewed with patient? {yes/no:20286}  SHORT TERM GOALS: Target date: ***  *** Baseline: Goal status: INITIAL  2.  *** Baseline:  Goal status: INITIAL  3.  *** Baseline:  Goal status: INITIAL  4.  *** Baseline:  Goal status: INITIAL  5.  *** Baseline:  Goal status: INITIAL  6.  *** Baseline:  Goal status: INITIAL  LONG TERM GOALS: Target date: ***  *** Baseline:  Goal status: INITIAL  2.  *** Baseline:  Goal status: INITIAL  3.  *** Baseline:  Goal status: INITIAL  4.  *** Baseline:  Goal status: INITIAL  5.  *** Baseline:  Goal status: INITIAL  6.  *** Baseline:  Goal status: INITIAL  ASSESSMENT:  CLINICAL IMPRESSION: Patient is a *** y.o. *** who was seen today for occupational therapy evaluation for ***.   PERFORMANCE DEFICITS: in functional skills including {OT physical skills:25468}, cognitive skills including {OT cognitive skills:25469}, and psychosocial skills including {OT psychosocial skills:25470}.   IMPAIRMENTS: are limiting patient from {OT performance deficits:25471}.   COMORBIDITIES: has co-morbidities such as HTN that affects occupational performance. Patient will benefit from skilled OT to address above impairments and improve overall function.  MODIFICATION OR ASSISTANCE TO COMPLETE EVALUATION: Min-Moderate modification of tasks or assist with assess necessary to complete an evaluation.  OT OCCUPATIONAL PROFILE AND HISTORY: Detailed assessment: Review of records and additional review of physical, cognitive, psychosocial history related to current functional  performance.  CLINICAL DECISION MAKING: Moderate - several treatment options, min-mod task modification necessary  REHAB POTENTIAL: Good  EVALUATION COMPLEXITY: Moderate      PLAN:  OT FREQUENCY: {rehab frequency:25116}  OT DURATION: {rehab duration:25117}  PLANNED INTERVENTIONS: 97168 OT Re-evaluation, 97535 self care/ADL training, 02889 therapeutic exercise, 97530 therapeutic activity, 97140 manual therapy, 97018 paraffin, 02960 fluidotherapy, 97010 moist heat, 97010 cryotherapy, 97760 Orthotic Initial, 97763 Orthotic/Prosthetic subsequent, passive range of motion, functional mobility training, and patient/family education  RECOMMENDED OTHER SERVICES: none at this time  CONSULTED AND AGREED WITH PLAN OF CARE: Patient  PLAN FOR NEXT SESSION: ***   Rocky Dutch, OT 06/20/2024, 2:58 PM

## 2024-06-21 ENCOUNTER — Ambulatory Visit

## 2024-06-30 ENCOUNTER — Ambulatory Visit (HOSPITAL_BASED_OUTPATIENT_CLINIC_OR_DEPARTMENT_OTHER): Admitting: Student

## 2024-06-30 ENCOUNTER — Ambulatory Visit (HOSPITAL_BASED_OUTPATIENT_CLINIC_OR_DEPARTMENT_OTHER): Admitting: Orthopaedic Surgery

## 2024-06-30 NOTE — Therapy (Signed)
 OUTPATIENT OCCUPATIONAL THERAPY ORTHO EVALUATION  Patient Name: Mariah [name] MRN: [id number] DOB:[birth year], 63 y.o., female Today's Date: 07/01/2024  PCP: Theophilus Pagan, MD REFERRING PROVIDER: Arlinda Buster, MD  END OF SESSION:  OT End of Session - 07/01/24 1151     Visit Number 1    Number of Visits 9   including eval   Date for Recertification  08/28/24    Authorization Type [employer]    OT Start Time 0849    OT Stop Time 0930    OT Time Calculation (min) 41 min    Equipment Utilized During Treatment testing materials    Activity Tolerance Patient tolerated treatment well    Behavior During Therapy [facility] for tasks assessed/performed          Past Medical History:  Diagnosis Date   Cramp of muscle of left upper extremity 06/24/2019   Decreased peripheral vision of left eye 08/19/2019   Degenerative disc disease, lumbar    Ear pain 11/21/2019   Elevated hemoglobin A1c 10/26/2019   Elevated hemoglobin A1c 11/23/2019   GERD (gastroesophageal reflux disease)    Hypertension    Insomnia 07/11/2010   Qualifier: Diagnosis of  By: Zackary MD, [facility]     Mitral valve prolapse    Nasal congestion 04/01/2019   Numbness and tingling of right hand 01/29/2017   Pain and swelling of right knee 12/29/2019   Postprandial bloating 04/01/2019   Pre-diabetes    Prediabetes 10/24/2016   Shortness of breath 11/21/2019   TIA (transient ischemic attack)    Past Surgical History:  Procedure Laterality Date   ABDOMINAL HYSTERECTOMY     ABDOMINAL HYSTERECTOMY     BACK SURGERY     CHOLECYSTECTOMY     JOINT REPLACEMENT     KNEE SURGERY     LEFT HEART CATH AND CORONARY ANGIOGRAPHY N/A 10/14/2023   Procedure: LEFT HEART CATH AND CORONARY ANGIOGRAPHY;  Surgeon: Swaziland, Peter M, MD;  Location: [facility] INVASIVE CV LAB;  Service: Cardiovascular;  Laterality: N/A;   LOWER EXTREMITY ANGIOGRAPHY Left 06/10/2022   Procedure: Lower Extremity Angiography;  Surgeon: Jama Cordella MATSU, MD;   Location: [facility] INVASIVE CV LAB;  Service: Cardiovascular;  Laterality: Left;   Patient Active Problem List   Diagnosis Date Noted   NSTEMI (non-ST elevated myocardial infarction) (HCC) 10/14/2023   Trapezius muscle strain, right, initial encounter 03/21/2022   Left shoulder tendinitis 02/25/2021   Chronic pain of both shoulders 01/10/2021   Functional dyspepsia 01/10/2021   Chronic pain of right knee 12/29/2019   Atherosclerosis of native arteries of extremity with intermittent claudication 11/04/2019   Nasal turbinate hypertrophy 07/07/2019   Eczema 02/11/2018   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   Hyperlipidemia 06/26/2017   TIA (transient ischemic attack) 06/23/2017   Health care maintenance 10/24/2016   T2DM (type 2 diabetes mellitus) (HCC) 11/02/2015   Status post cervical spinal fusion 04/27/2015   Cervical myelopathy (HCC) 12/22/2014   Benign paroxysmal positional vertigo 10/06/2013   Amaurosis fugax 08/16/2013   Plantar wart 02/22/2013   Degenerative disc disease, lumbar    Mitral valve prolapse    Insomnia 07/11/2010   OBESITY 02/13/2009   Benign essential hypertension 08/07/2008   Anxiety 02/26/2007   Allergic rhinitis 02/26/2007   GERD 02/26/2007    ONSET DATE: 06/15/24 referral date  REFERRING DIAG: M79.641 (ICD-10-CM) - Right hand pain   M25.511,G89.29,M25.512 (ICD-10-CM) - Chronic pain of both shoulders    THERAPY DIAG:  Other lack of coordination  Trigger finger, right middle finger  Other symptoms and signs involving the musculoskeletal system  Muscle weakness (generalized)  Pain in right hand  Rationale for Evaluation and Treatment: Rehabilitation  REFER TO [place]  HAND PROTOCOL VOL. 2 PGS [phone]  SUBJECTIVE:   SUBJECTIVE STATEMENT: Pt reports doing well, stated that the cortisone shot received at most recent appt was helpful.  Pt accompanied by: significant other Cordella  PERTINENT HISTORY: Pt being seen at this OP clinic  post visit with Dr. Erwin on 06/15/24 for R long finger trigger digit, which has been occurring for 3+ months. Pt choosing to manage conservatively at this time, received a cortisone injection to R long finger A1 pulley for symptom relief. Other PMH includes: CTS R wrist, TIA, DM2, NSTEMI, R trapezius muscle strain, L shoulder tendinitis, chronic pain B shoulders and R knee,atherosclerosis of native arteries of extremity with intermittent claudication, mitral valve prolapse   PRECAUTIONS: None  RED FLAGS: None  WEIGHT BEARING RESTRICTIONS: No  PAIN:  Are you having pain? No, it's feeling a lot better since the cortisone shot.  FALLS: Has patient fallen in last 6 months? No  LIVING ENVIRONMENT: Lives with: lives with their spouse Lives in: House/apartment 1 level Stairs: Yes: External: 2 steps; can reach both Has following equipment at home: Grab bars  PLOF: Independent  PATIENT GOALS: To sleep better, to get my hand back to working like it was before the trigger finger   NEXT MD VISIT: 07/14/24 with PCP; 07/27/24 with Dr. Erwin  OBJECTIVE:  Note: Objective measures were completed at Evaluation unless otherwise noted.  HAND DOMINANCE: Right  ADLs: WFL  FUNCTIONAL OUTCOME MEASURES: Upper Extremity Functional Scale (UEFS): 73/80   UPPER EXTREMITY ROM:   WNL, reports catching at R long finger at times    UPPER EXTREMITY MMT:   NT, pt has hx of B shoulder pain, pt wishes to f/u with doctor on shoulders before addressing.  MMT Right eval Left eval  Shoulder flexion    Shoulder abduction    Shoulder adduction    Shoulder extension    Shoulder internal rotation    Shoulder external rotation    Middle trapezius    Lower trapezius    Elbow flexion    Elbow extension    Wrist flexion    Wrist extension    Wrist ulnar deviation    Wrist radial deviation    Wrist pronation    Wrist supination    (Blank rows = not tested)  HAND FUNCTION: Grip strength: Right: 62.1 lbs;  Left: 69.0 lbs  COORDINATION: 9 Hole Peg test: Right: 22.91 sec; Left: 21.11 sec Box and Blocks:  Right 54blocks, Left 56 blocks  SENSATION: Numbness and tingling off and on, it was worse before the shot  EDEMA: swelling at night  COGNITION: Overall cognitive status: Within functional limits for tasks assessed   OBSERVATIONS: slightly impaired grip strength in dominant R hand as well as coordination. While pt reports ability to complete ADL/IADL reports some difficulty with certain tasks including sleeping and opening jars    TREATMENT DATE: 07/01/24  Educated pt and spouse in purpose of OT, goals, and POC. Educated in hook fist AROM/PROM per IHP protocol and to massage palm and between digits for relieve pain.    PATIENT EDUCATION: Education details: SEE ABOVE Person educated: Patient and Spouse Education method: Explanation, Demonstration, and Handouts Education comprehension: verbalized understanding and returned demonstration  HOME EXERCISE PROGRAM: A/PROM hook fist HEP (ACCESS CODE [id number])  GOALS: Goals reviewed with patient? Yes  SHORT TERM GOALS: Target date: 07/29/24  Pt will be independent with HEP for grip strengthening and coordination  Baseline: New to OP OT Goal status: INITIAL  2.  Pt will be independent with orthotic mgmt and hygiene for R long digit trigger finger if prescribed Baseline: new to OP OT Goal status: INITIAL  3.  Pt will be educated in and explore AE for reducing strain on R long finger trigger finger  Baseline: New to OP OT Goal status: INITIAL  4.  Pt will demonstrate improved functional use and coordination/dexterity of R hand by increased Box and Blocks score to at least 58 blocks Baseline: Right 54 blocks, Left 56 blocks Goal status: INITIAL    LONG TERM GOALS: Target date: 08/26/24  Pt will report improved  function by demonstrating a score of at least 77/80 Baseline: 73/80 Goal status: INITIAL  2.  Pt will increase R grip strength by 8 lbs Baseline: Right: 62.1 lbs; Left: 69.0 lbs Goal status: INITIAL  3.  Pt will demonstrate improved R FM coordination by a score of no more than 20 seconds Baseline: Right: 22.91 sec; Left: 21.11 sec Goal status: INITIAL    ASSESSMENT:  CLINICAL IMPRESSION: Patient is a 63 y.o. female who was seen today for occupational therapy evaluation for B shoulder pain and R long digit trigger finger. Hx includes DM2, HTN, CTS of R wrist, B chronic shoulder pain, TIA. Patient currently presents below baseline level of functioning demonstrating functional deficits and impairments as noted below. Pt would benefit from skilled OT services in the outpatient setting to work on impairments as noted below to help pt return to PLOF as able.     PERFORMANCE DEFICITS: in functional skills including ADLs, sensation, edema, strength, pain, flexibility, Fine motor control, and UE functional use, and psychosocial skills including environmental adaptation and routines and behaviors.   IMPAIRMENTS: are limiting patient from ADLs, rest and sleep, and leisure.   COMORBIDITIES: may have co-morbidities  that affects occupational performance. Patient will benefit from skilled OT to address above impairments and improve overall function.  MODIFICATION OR ASSISTANCE TO COMPLETE EVALUATION: Min-Moderate modification of tasks or assist with assess necessary to complete an evaluation.  OT OCCUPATIONAL PROFILE AND HISTORY: Detailed assessment: Review of records and additional review of physical, cognitive, psychosocial history related to current functional performance.  CLINICAL DECISION MAKING: Moderate - several treatment options, min-mod task modification necessary  REHAB POTENTIAL: Good  EVALUATION COMPLEXITY: Moderate   For all possible CPT codes, reference the Planned Interventions  line above.     Check all conditions that are expected to impact treatment: {Conditions expected to impact treatment:Diabetes mellitus and Musculoskeletal disorders   If treatment provided at initial evaluation, no treatment charged due to lack of authorization.        PLAN:  OT FREQUENCY: 1x/week plus eval  OT DURATION: 8 weeks  PLANNED INTERVENTIONS: 97168 OT Re-evaluation, 97535 self care/ADL training, 02889 therapeutic exercise, 97530 therapeutic activity, 97140 manual therapy, 97035 ultrasound, 97018 paraffin, 02960 fluidotherapy, 97033 iontophoresis, 97760 Orthotic Initial, [id number] Orthotic/Prosthetic subsequent,  passive range of motion, patient/family education, and DME and/or AE instructions  RECOMMENDED OTHER SERVICES: none   CONSULTED AND AGREED WITH PLAN OF CARE: Patient and family member/caregiver  PLAN FOR NEXT SESSION: consider hand based MP joint flexion blocking orthosis F/u hook fist exercises Activity modification Edema mgmt Putty exercises   Rocky Dutch, OT 07/01/2024, 11:52 AM

## 2024-07-01 ENCOUNTER — Ambulatory Visit: Attending: Orthopedic Surgery

## 2024-07-01 DIAGNOSIS — M6281 Muscle weakness (generalized): Secondary | ICD-10-CM | POA: Diagnosis not present

## 2024-07-01 DIAGNOSIS — R29898 Other symptoms and signs involving the musculoskeletal system: Secondary | ICD-10-CM | POA: Diagnosis not present

## 2024-07-01 DIAGNOSIS — M79641 Pain in right hand: Secondary | ICD-10-CM | POA: Insufficient documentation

## 2024-07-01 DIAGNOSIS — M65331 Trigger finger, right middle finger: Secondary | ICD-10-CM | POA: Insufficient documentation

## 2024-07-01 DIAGNOSIS — R278 Other lack of coordination: Secondary | ICD-10-CM | POA: Insufficient documentation

## 2024-07-07 ENCOUNTER — Ambulatory Visit

## 2024-07-07 ENCOUNTER — Telehealth: Payer: Self-pay

## 2024-07-07 NOTE — Telephone Encounter (Signed)
 Pt no-showed for OT appointment today. Therefore, OT called pt's listed phone number [phone]). OT reminded pt of next OT appointment date/time, provided education on cancellation/no-show policy, recommended to pt to call clinic office if pt is unable to make appointments, and provided contact information of clinic.

## 2024-07-12 NOTE — Progress Notes (Deleted)
    SUBJECTIVE:   CHIEF COMPLAINT / HPI:   *Colonoscopy  Trigger finger - injection with Ortho on 06/15/2024.  PERTINENT  PMH / PSH: ***  OBJECTIVE:   There were no vitals taken for this visit. ***  General: NAD, pleasant, able to participate in exam Cardiac: RRR, no murmurs. Respiratory: CTAB, normal effort, No wheezes, rales or rhonchi Abdomen: Bowel sounds present, nontender, nondistended Extremities: no edema or cyanosis. Skin: warm and dry, no rashes noted Neuro: alert, no obvious focal deficits Psych: Normal affect and mood  ASSESSMENT/PLAN:   No problem-specific Assessment & Plan notes found for this encounter.     Dr. Izetta Nap, DO [facility]    {    This will disappear when note is signed, click to select method of visit    :1}

## 2024-07-14 ENCOUNTER — Ambulatory Visit: Admitting: Family Medicine

## 2024-07-15 ENCOUNTER — Ambulatory Visit

## 2024-07-18 ENCOUNTER — Encounter: Payer: Self-pay | Admitting: Radiology

## 2024-07-18 ENCOUNTER — Ambulatory Visit: Attending: Orthopedic Surgery

## 2024-07-18 ENCOUNTER — Telehealth: Payer: Self-pay

## 2024-07-18 NOTE — Telephone Encounter (Signed)
 Pt no-showed for OT appointment today. Therefore, OT called pt's listed phone number [phone]). OT left voicemail reminding pt of next OT appointment date/time, provided education on cancellation/no-show policy, recommended to pt to call clinic office if pt is unable to make appointments, and provided contact information of clinic.

## 2024-07-20 ENCOUNTER — Ambulatory Visit: Admitting: Podiatry

## 2024-07-20 ENCOUNTER — Ambulatory Visit (INDEPENDENT_AMBULATORY_CARE_PROVIDER_SITE_OTHER)

## 2024-07-20 ENCOUNTER — Encounter: Payer: Self-pay | Admitting: Podiatry

## 2024-07-20 VITALS — Ht 66.0 in | Wt 186.6 lb

## 2024-07-20 DIAGNOSIS — M216X1 Other acquired deformities of right foot: Secondary | ICD-10-CM

## 2024-07-20 DIAGNOSIS — M216X2 Other acquired deformities of left foot: Secondary | ICD-10-CM

## 2024-07-20 DIAGNOSIS — M205X1 Other deformities of toe(s) (acquired), right foot: Secondary | ICD-10-CM

## 2024-07-20 NOTE — Progress Notes (Signed)
 Chief Complaint  Patient presents with   Toe Pain    Pt is here due to right great toe pain, she states that this has been going on for 2 weeks, having some numbness and tingling sensation, no injury to the foot.    HPI: 63 y.o. female presenting today for follow-up evaluation of pain and tenderness associated to the right great toe joint and right ankle.  Also history of flatfoot deformity.  Most recently over the last few weeks she has noticed increased pain with numbness and tingling  Past Medical History:  Diagnosis Date   Cramp of muscle of left upper extremity 06/24/2019   Decreased peripheral vision of left eye 08/19/2019   Degenerative disc disease, lumbar    Ear pain 11/21/2019   Elevated hemoglobin A1c 10/26/2019   Elevated hemoglobin A1c 11/23/2019   GERD (gastroesophageal reflux disease)    Hypertension    Insomnia 07/11/2010   Qualifier: Diagnosis of  By: Zackary MD, [facility]     Mitral valve prolapse    Nasal congestion 04/01/2019   Numbness and tingling of right hand 01/29/2017   Pain and swelling of right knee 12/29/2019   Postprandial bloating 04/01/2019   Pre-diabetes    Prediabetes 10/24/2016   Shortness of breath 11/21/2019   TIA (transient ischemic attack)     Past Surgical History:  Procedure Laterality Date   ABDOMINAL HYSTERECTOMY     ABDOMINAL HYSTERECTOMY     BACK SURGERY     CHOLECYSTECTOMY     JOINT REPLACEMENT     KNEE SURGERY     LEFT HEART CATH AND CORONARY ANGIOGRAPHY N/A 10/14/2023   Procedure: LEFT HEART CATH AND CORONARY ANGIOGRAPHY;  Surgeon: Jordan, Peter M, MD;  Location: [facility];  Service: Cardiovascular;  Laterality: N/A;   LOWER EXTREMITY ANGIOGRAPHY Left 06/10/2022   Procedure: Lower Extremity Angiography;  Surgeon: Jama Cordella MATSU, MD;  Location: [facility] INVASIVE CV LAB;  Service: Cardiovascular;  Laterality: Left;    Allergies  Allergen Reactions   Lisinopril     REACTION: COUGH   Moxifloxacin     REACTION: N  \T\ V   Sulfa Antibiotics     Other reaction(s): Other (See Comments), Other (See Comments) Pt cannot remember rxn Pt cannot remember rxn    Sulfonamide Derivatives Other (See Comments)    Pt cannot remember rxn     Physical Exam: General: The patient is alert and oriented x3 in no acute distress.  Dermatology: Skin is warm, dry and supple bilateral lower extremities.   Vascular: Palpable pedal pulses bilaterally. Capillary refill within normal limits.  No appreciable edema.  No erythema.  Neurological: Grossly intact via light touch  Musculoskeletal Exam: Minimal tenderness with palpation to the anterior lateral aspect of the right ankle.  Limited range of motion of the first MTP with crepitus consistent with advanced severe arthritis to the joint.  With weightbearing there is collapse of the medial longitudinal arch of the foot bilateral  Radiographic Exam RT foot and ankle 12/16/2023:  Tibiotalar joint congruent without any indication of degenerative changes.  Advanced severe degenerative changes with para-articular spurring noted to the first MTP of the right foot.  No acute fractures identified.  Assessment/Plan of Care: 1.  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  Intermittent ankle capsulitis; anterolateral 3.  Pes planovalgus bilateral  Hallux limitus, right - Plan: DG Foot Complete Right  Other acquired deformities of left foot  Other acquired deformities of right foot   -  Patient evaluated.   - Declined cortisone injection -I do believe that orthotics to support the medial longitudinal arch of the foot and offload pressure from the forefoot should help tremendously and alleviate a lot of the patient's pain and symptoms potentially. -Today the patient was molded for custom orthotics -Return to clinic for orthotics pickup -In the meantime continue good supportive tennis shoes and sneakers.  Refrain from going barefoot.     Thresa EMERSON Sar, DPM [facility]  Dr. Thresa EMERSON Sar, DPM    [address] [place], [place] [address]                Office [phone]  Fax [phone]

## 2024-07-21 NOTE — Progress Notes (Signed)
    SUBJECTIVE:   CHIEF COMPLAINT / HPI:   Discussed the use of AI scribe software for clinical note transcription with the patient, who gave verbal consent to proceed.  Gluteal rash - Persistent bumps on the buttocks - Lesions are worsened by frequent bowel movements and irritation from wiping - Application of A and D ointment as a barrier cream provides effective management of irritation - No use of baby wipes or bidet to reduce irritation  GERD/colonoscopy - History of stomach problems - Plans to schedule colonoscopy and endoscopy - No recent consultation with gastroenterology specialist but intends to follow up      PERTINENT  PMH / PSH: CAD, HTN, GERD, T2DM   OBJECTIVE:   BP 130/70   Ht 1' (0.305 m)   Wt 188 lb (85.3 kg)   SpO2 99%   BMI 917.91 kg/m    General: NAD, pleasant, able to participate in exam GU: Gluteal cleft with flesh-colored, small bumps around perineal region without surrounding erythema or discharge. Extremities: no edema or cyanosis. MSK: Bilateral shoulders: -No erythematous or deformity Nontender to palpation any other glenohumeral joints- -Full ROM with abduction and overhead -Numbness and tingling extending down right arm until fingertips Skin: warm and dry, no rashes noted Neuro: alert, no obvious focal deficits Psych: Normal affect and mood  ASSESSMENT/PLAN:   Assessment & Plan Type 2 diabetes mellitus without complication, without long-term current use of insulin  (HCC) Managed by lifestyle modifications. Discussed importance of regular eye exams. - Referred to ophthalmologist for diabetic eye exam. Screening for colon cancer Agreeable to colonoscopy and also desires EGD given concomitant history of GERD. - Referral to GI Perineal irritation Perianal irritation likely from frequent bowel movements and wiping. No infection or inflammation. Increased bowel frequency, possibly IBS-related. - Continue barrier cream like A and D  ointment. - Consider baby wipes or bidet for gentler cleaning. - Increase dietary fiber intake. Chronic pain of both shoulders Chronic, pending orthopedic evaluation for surgical intervention.  Right shoulder with persistent numbness to fingertips. - Follow-up with orthopedics for continued care, if declined surgical intervention could consider PT given adequate ROM without much pain upon exam     Dr. Izetta Nap, DO [facility]

## 2024-07-22 ENCOUNTER — Ambulatory Visit (INDEPENDENT_AMBULATORY_CARE_PROVIDER_SITE_OTHER): Admitting: Family Medicine

## 2024-07-22 VITALS — BP 130/70 | Ht <= 58 in | Wt 188.0 lb

## 2024-07-22 DIAGNOSIS — G8929 Other chronic pain: Secondary | ICD-10-CM | POA: Diagnosis not present

## 2024-07-22 DIAGNOSIS — M25512 Pain in left shoulder: Secondary | ICD-10-CM

## 2024-07-22 DIAGNOSIS — E119 Type 2 diabetes mellitus without complications: Secondary | ICD-10-CM | POA: Diagnosis not present

## 2024-07-22 DIAGNOSIS — L293 Anogenital pruritus, unspecified: Secondary | ICD-10-CM | POA: Diagnosis not present

## 2024-07-22 DIAGNOSIS — Z1211 Encounter for screening for malignant neoplasm of colon: Secondary | ICD-10-CM | POA: Diagnosis not present

## 2024-07-22 DIAGNOSIS — M25511 Pain in right shoulder: Secondary | ICD-10-CM | POA: Diagnosis not present

## 2024-07-22 NOTE — Patient Instructions (Addendum)
 It was wonderful to see you today! Thank you for choosing [facility].   Please bring ALL of your medications with you to every visit.   Today we talked about:  The area around your bottom looks good there is just some mild irritation.  You can continue to use the barrier cream as needed.  I recommend trialing something like baby wipes or bidet to prevent excessive wiping of the area that can breakdown the skin. I placed a referral for the eye doctor so they can complete your diabetic eye exam, our office will follow-up with that information. Please call [facility] to get your colonoscopy and get evaluated for an endoscopy using the information below.  I placed a referral today for you just in case.  [facility] [address] [place] [address] [place],  [place]  [address] Main: [phone]  Please follow up in 3 months   Call the clinic at [phone] if your symptoms worsen or you have any concerns.  Please be sure to schedule follow up at the front desk before you leave today.   Izetta Nap, DO Family Medicine

## 2024-07-22 NOTE — Assessment & Plan Note (Signed)
 Managed by lifestyle modifications. Discussed importance of regular eye exams. - Referred to ophthalmologist for diabetic eye exam.

## 2024-07-22 NOTE — Assessment & Plan Note (Signed)
 Chronic, pending orthopedic evaluation for surgical intervention.  Right shoulder with persistent numbness to fingertips. - Follow-up with orthopedics for continued care, if declined surgical intervention could consider PT given adequate ROM without much pain upon exam

## 2024-07-27 ENCOUNTER — Ambulatory Visit: Admitting: Orthopedic Surgery

## 2024-08-04 ENCOUNTER — Ambulatory Visit (HOSPITAL_BASED_OUTPATIENT_CLINIC_OR_DEPARTMENT_OTHER): Admitting: Orthopaedic Surgery

## 2024-08-04 ENCOUNTER — Ambulatory Visit (HOSPITAL_BASED_OUTPATIENT_CLINIC_OR_DEPARTMENT_OTHER)

## 2024-08-04 DIAGNOSIS — M19011 Primary osteoarthritis, right shoulder: Secondary | ICD-10-CM | POA: Diagnosis not present

## 2024-08-04 DIAGNOSIS — M25512 Pain in left shoulder: Secondary | ICD-10-CM | POA: Diagnosis not present

## 2024-08-04 DIAGNOSIS — G8929 Other chronic pain: Secondary | ICD-10-CM

## 2024-08-04 DIAGNOSIS — M19012 Primary osteoarthritis, left shoulder: Secondary | ICD-10-CM | POA: Diagnosis not present

## 2024-08-04 DIAGNOSIS — M25511 Pain in right shoulder: Secondary | ICD-10-CM

## 2024-08-04 NOTE — Progress Notes (Signed)
 Chief Complaint: Bilateral shoulder pain, bilateral hand pain and numbness     History of Present Illness:    Mariah [name] is a 63 y.o. female right-hand-dominant who presents with right worse than left shoulder pain.  She is status post right shoulder rotator cuff repair with Dr. Beverley several years prior.  She has been having persistent pain on this left side.  She did do physical therapy following her initial surgery for both shoulders.  At this time she has been having overhead limited range of motion.  She has been having difficulty with ADLs.  She is also endorsing some hand numbness as well throughout all of the digits right worse than left which has been progressive    PMH/PSH/Family History/Social History/Meds/Allergies:    Past Medical History:  Diagnosis Date   Cramp of muscle of left upper extremity 06/24/2019   Decreased peripheral vision of left eye 08/19/2019   Degenerative disc disease, lumbar    Ear pain 11/21/2019   Elevated hemoglobin A1c 10/26/2019   Elevated hemoglobin A1c 11/23/2019   GERD (gastroesophageal reflux disease)    Hypertension    Insomnia 07/11/2010   Qualifier: Diagnosis of  By: Zackary MD, [facility]     Mitral valve prolapse    Nasal congestion 04/01/2019   Numbness and tingling of right hand 01/29/2017   Pain and swelling of right knee 12/29/2019   Postprandial bloating 04/01/2019   Pre-diabetes    Prediabetes 10/24/2016   Shortness of breath 11/21/2019   TIA (transient ischemic attack)    Past Surgical History:  Procedure Laterality Date   ABDOMINAL HYSTERECTOMY     ABDOMINAL HYSTERECTOMY     BACK SURGERY     CHOLECYSTECTOMY     JOINT REPLACEMENT     KNEE SURGERY     LEFT HEART CATH AND CORONARY ANGIOGRAPHY N/A 10/14/2023   Procedure: LEFT HEART CATH AND CORONARY ANGIOGRAPHY;  Surgeon: Jordan, Peter M, MD;  Location: [facility] INVASIVE CV LAB;  Service: Cardiovascular;  Laterality: N/A;   LOWER EXTREMITY ANGIOGRAPHY Left 06/10/2022    Procedure: Lower Extremity Angiography;  Surgeon: Jama Cordella MATSU, MD;  Location: [facility] INVASIVE CV LAB;  Service: Cardiovascular;  Laterality: Left;   Social History   Socioeconomic History   Marital status: Divorced    Spouse name: Not on file   Number of children: Not on file   Years of education: Not on file   Highest education level: Some college, no degree  Occupational History   Not on file  Tobacco Use   Smoking status: Former    Current packs/day: 0.00    Types: Cigarettes    Quit date: 09/16/1995    Years since quitting: 28.9    Passive exposure: Past   Smokeless tobacco: Never  Substance and Sexual Activity   Alcohol use: Yes    Alcohol/week: 5.0 standard drinks of alcohol    Types: 5 Cans of beer per week   Drug use: Yes    Types: Marijuana   Sexual activity: Yes    Birth control/protection: Condom  Other Topics Concern   Not on file  Social History Narrative   Not on file   Social Drivers of Health   Financial Resource Strain: High Risk (01/26/2024)   Overall Financial Resource Strain (CARDIA)    Difficulty of Paying Living Expenses: Hard  Food Insecurity: Food Insecurity Present (01/26/2024)   Hunger Vital Sign    Worried About Running Out of Food in the Last Year: Sometimes true  Ran Out of Food in the Last Year: Patient declined  Transportation Needs: No Transportation Needs (01/26/2024)   PRAPARE - Administrator, Civil Service (Medical): No    Lack of Transportation (Non-Medical): No  Physical Activity: Insufficiently Active (01/26/2024)   Exercise Vital Sign    Days of Exercise per Week: 2 days    Minutes of Exercise per Session: 20 min  Stress: No Stress Concern Present (01/26/2024)   [employer] of Occupational Health - Occupational Stress Questionnaire    Feeling of Stress : Only a little  Social Connections: Unknown (01/26/2024)   Social Connection and Isolation Panel    Frequency of Communication with Friends and Family:  More than three times a week    Frequency of Social Gatherings with Friends and Family: Patient declined    Attends Religious Services: More than 4 times per year    Active Member of [employer] or Organizations: Yes    Attends Banker Meetings: 1 to 4 times per year    Marital Status: Patient declined   Family History  Problem Relation Age of Onset   Lymphoma Mother    Lung cancer Father    Pulmonary embolism Brother    Colon cancer Neg Hx    Esophageal cancer Neg Hx    Liver cancer Neg Hx    Pancreatic cancer Neg Hx    Rectal cancer Neg Hx    Stomach cancer Neg Hx    Allergies  Allergen Reactions   Lisinopril     REACTION: COUGH   Moxifloxacin     REACTION: N \\T \ V   Sulfa Antibiotics     Other reaction(s): Other (See Comments), Other (See Comments) Pt cannot remember rxn Pt cannot remember rxn    Sulfonamide Derivatives Other (See Comments)    Pt cannot remember rxn   Current Outpatient Medications  Medication Sig Dispense Refill   acetaminophen  (TYLENOL ) 500 MG tablet Take 2 tablets (1,000 mg total) by mouth every 8 (eight) hours as needed.     aspirin  EC (ASPIRIN  LOW DOSE) 81 MG tablet TAKE 1 TABLET BY MOUTH DAILY, SWALLOW WHOLE 90 tablet 3   atorvastatin  (LIPITOR) 40 MG tablet Take 1 tablet (40 mg total) by mouth daily.     azelastine  (ASTELIN ) 0.1 % nasal spray 1 puff in each nostril Nasally Twice a day 30 mL 2   fluticasone  (FLONASE ) 50 MCG/ACT nasal spray INSTILL 2 SPRAY IN EACH NOSTRIL EVERY DAY 16 g 11   metoprolol  tartrate (LOPRESSOR ) 25 MG tablet TAKE 1/2 TABLET(12.5 MG) BY MOUTH TWICE DAILY 90 tablet 3   olmesartan -hydrochlorothiazide  (BENICAR  HCT) 40-25 MG tablet TAKE 1 TABLET BY MOUTH DAILY 90 tablet 1   omeprazole  (PRILOSEC) 40 MG capsule TAKE 1 CAPSULE(40 MG) BY MOUTH DAILY 90 capsule 3   sodium chloride  (OCEAN) 0.65 % nasal spray Place into the nose as needed for congestion.     No current facility-administered medications for this visit.   No  results found.  Review of Systems:   A ROS was performed including pertinent positives and negatives as documented in the HPI.  Physical Exam :   Constitutional: NAD and appears stated age Neurological: Alert and oriented Psych: Appropriate affect and cooperative There were no vitals taken for this visit.   Comprehensive Musculoskeletal Exam:    Bilateral positive Tinel as well as Durkan positive Neer impingement bilaterally.  Forward elevation is to 160 degrees external rotation with side to 45 degrees internal rotation is to  L1 with pain   Imaging:   Xray (3 views right shoulder, 3 views left shoulder): Decreased acromiohumeral interval on the right as well as left consistent with prior rotator cuff pathology    I personally reviewed and interpreted the radiographs.   Assessment and Plan:   63 y.o. female with bilateral evidence of right as well as left rotator cuff pathology status post right rotator cuff repair with Dr. Beverley 2 years prior.  At this time I did discuss that I would like to obtain an MRI of both shoulders as she had had previous physical therapy without a change.  We also plan to send her to Dr. Eldonna for an EMG nerve conduction test of bilateral hands to rule out carpal tunnel syndrome  -return following MRI bilateral shoulder MRI   I personally saw and evaluated the patient, and participated in the management and treatment plan.  Elspeth Parker, MD Attending Physician, Orthopedic Surgery  This document was dictated using Dragon voice recognition software. A reasonable attempt at proof reading has been made to minimize errors.

## 2024-08-09 ENCOUNTER — Ambulatory Visit: Admitting: Orthopedic Surgery

## 2024-08-09 NOTE — Progress Notes (Deleted)
 Follow up right right long finger Injection 06/15/24

## 2024-08-18 DIAGNOSIS — R0981 Nasal congestion: Secondary | ICD-10-CM | POA: Diagnosis not present

## 2024-08-18 DIAGNOSIS — K219 Gastro-esophageal reflux disease without esophagitis: Secondary | ICD-10-CM | POA: Diagnosis not present

## 2024-08-18 DIAGNOSIS — R09A2 Foreign body sensation, throat: Secondary | ICD-10-CM | POA: Diagnosis not present

## 2024-08-18 DIAGNOSIS — J309 Allergic rhinitis, unspecified: Secondary | ICD-10-CM | POA: Diagnosis not present

## 2024-09-05 ENCOUNTER — Encounter: Payer: Self-pay | Admitting: Family Medicine

## 2024-09-05 ENCOUNTER — Ambulatory Visit: Payer: Self-pay | Admitting: Family Medicine

## 2024-09-05 ENCOUNTER — Ambulatory Visit (INDEPENDENT_AMBULATORY_CARE_PROVIDER_SITE_OTHER): Payer: Self-pay | Admitting: Family Medicine

## 2024-09-05 ENCOUNTER — Ambulatory Visit
Admission: RE | Admit: 2024-09-05 | Discharge: 2024-09-05 | Disposition: A | Source: Ambulatory Visit | Attending: Family Medicine | Admitting: Family Medicine

## 2024-09-05 VITALS — BP 135/68 | HR 83 | Ht 66.0 in | Wt 196.0 lb

## 2024-09-05 DIAGNOSIS — R052 Subacute cough: Secondary | ICD-10-CM | POA: Diagnosis not present

## 2024-09-05 DIAGNOSIS — R051 Acute cough: Secondary | ICD-10-CM | POA: Diagnosis not present

## 2024-09-05 DIAGNOSIS — H6121 Impacted cerumen, right ear: Secondary | ICD-10-CM | POA: Diagnosis not present

## 2024-09-05 NOTE — Patient Instructions (Addendum)
 It was wonderful to see you today! Thank you for choosing [facility].   Please bring ALL of your medications with you to every visit.   Today we talked about:  I did order the chest x-ray to be done that you can walk-in at any time at [address] which is [employer] and I will get those results fairly quickly in my box.  If he indicates you need any treatment for anything such as pneumonia or otherwise then I will follow-up with you for further discussion.  In the meantime I do think this is likely related to your severe allergies as noted by the ENT doctor.  Please continue on your Flonase , azelastine  and other allergy medication.  She did think you would be a good candidate for immunotherapy of which one treatment option is doing allergy shots.  I did provide more information and if you would like to follow-up in discussion with her please schedule an appointment with their office.  It is likely that your symptoms are ongoing due to continued triggers from cold air and other allergens, please try to reduce the amount of smoking that you do as this can further cause inflammation.  If you develop fevers and worsening shortness of breath you could have a secondary bacterial infection so please return to care for evaluation.  Please follow up in 2 weeks if persistent symptoms  Call the clinic at [phone] if your symptoms worsen or you have any concerns.  Please be sure to schedule follow up at the front desk before you leave today.   Izetta Nap, DO Family Medicine

## 2024-09-05 NOTE — Progress Notes (Signed)
" ° ° °  SUBJECTIVE:   CHIEF COMPLAINT / HPI:   Discussed the use of AI scribe software for clinical note transcription with the patient, who gave verbal consent to proceed.  Sinus congestion and allergic symptoms - Persistent sinus congestion and allergies worsened by recent weather changes - Recent steroid pack by ENT on 12/4 improved symptoms and sleep, but ear congestion and nasal stuffiness persist despite humidifier use - Uses Flonase , Astaxanthin, and saline nasal sprays with partial relief - Symptoms worsen when lying down - Thick nasal discharge becomes clearer with blowing and hydration - Intermittent ear congestion alternating sides, sometimes blocked by ear wax - Sensation of fluid or phlegm stuck in mid thoracic back, distinct from chest discomfort - No fever. Eating and drinking normally   PERTINENT  PMH / PSH: CAD, HTN, GERD, T2DM  OBJECTIVE:   BP 135/68   Pulse 83   Ht 5' 6 (1.676 m)   Wt 196 lb (88.9 kg)   SpO2 99%   BMI 31.64 kg/m    General: NAD, pleasant, able to participate in exam HEENT: Moist mucous membranes.  Mildly erythematous posterior oropharynx.  Mild rhinorrhea noted.  Erythematous nasal turbinates bilaterally.  Right TM with flesh-colored cerumen but TM normal-appearing.  Left TM normal. Cardiac: RRR, no murmurs. Respiratory: CTAB, normal effort, No wheezes, rales or rhonchi.  Mild tenderness to palpation over mid thoracic spinous process extending around to right paraspinal musculature. Extremities: no edema or cyanosis. Skin: warm and dry, no rashes noted Neuro: alert, no obvious focal deficits Psych: Normal affect and mood  ASSESSMENT/PLAN:   Assessment & Plan Subacute cough Persistent sinus congestion, ear fullness, and post-nasal drainage, likely recovery from URI on top of severe environmental allergies per ENT.  Reassuring respiratory exam but given duration of symptoms will obtain CXR. - CXR, two-view - Continue nasal sprays and  Mucinex for mucus clearance. - Encouraged hydration and avoidance of irritants such as alcohol and marijuana. - Advised follow-up with ENT discussed immunotherapy if desired Impacted cerumen of right ear Attempted right ear lavage, some wax out but will likely require further home cleanout.    Dr. Izetta Nap, DO [facility]     "

## 2024-09-10 ENCOUNTER — Emergency Department (HOSPITAL_COMMUNITY)

## 2024-09-10 ENCOUNTER — Emergency Department (HOSPITAL_COMMUNITY)
Admission: EM | Admit: 2024-09-10 | Discharge: 2024-09-10 | Discharge status: Against Medical Advice | Attending: Emergency Medicine | Admitting: Emergency Medicine

## 2024-09-10 DIAGNOSIS — Z5321 Procedure and treatment not carried out due to patient leaving prior to being seen by health care provider: Secondary | ICD-10-CM | POA: Insufficient documentation

## 2024-09-10 DIAGNOSIS — M549 Dorsalgia, unspecified: Secondary | ICD-10-CM | POA: Diagnosis not present

## 2024-09-10 DIAGNOSIS — M25511 Pain in right shoulder: Secondary | ICD-10-CM | POA: Insufficient documentation

## 2024-09-10 NOTE — ED Notes (Signed)
 Pt refused updated vitals, pt stated that she wants to see a doctor

## 2024-09-10 NOTE — ED Triage Notes (Signed)
 Patient reports back pain, hurts when she breathes  Upper right shoulder blade area Started 3-4 days ago Rated 9/10

## 2024-09-10 NOTE — ED Notes (Addendum)
 Verbalized leaving to staff member in waiting area prior to treatment completion.

## 2024-09-11 ENCOUNTER — Other Ambulatory Visit: Payer: Self-pay

## 2024-09-11 ENCOUNTER — Emergency Department (HOSPITAL_COMMUNITY)
Admission: EM | Admit: 2024-09-11 | Discharge: 2024-09-11 | Disposition: A | Discharge status: Home / Self Care | Attending: Emergency Medicine | Admitting: Emergency Medicine

## 2024-09-11 DIAGNOSIS — M546 Pain in thoracic spine: Secondary | ICD-10-CM | POA: Insufficient documentation

## 2024-09-11 DIAGNOSIS — Z7982 Long term (current) use of aspirin: Secondary | ICD-10-CM | POA: Insufficient documentation

## 2024-09-11 DIAGNOSIS — E119 Type 2 diabetes mellitus without complications: Secondary | ICD-10-CM | POA: Insufficient documentation

## 2024-09-11 DIAGNOSIS — M549 Dorsalgia, unspecified: Secondary | ICD-10-CM

## 2024-09-11 DIAGNOSIS — M62830 Muscle spasm of back: Secondary | ICD-10-CM | POA: Diagnosis not present

## 2024-09-11 DIAGNOSIS — M62838 Other muscle spasm: Secondary | ICD-10-CM | POA: Diagnosis not present

## 2024-09-11 DIAGNOSIS — I10 Essential (primary) hypertension: Secondary | ICD-10-CM | POA: Insufficient documentation

## 2024-09-11 MED ORDER — LIDOCAINE 5 % EX PTCH
1.0000 | MEDICATED_PATCH | CUTANEOUS | Status: DC
  Filled 2024-09-11: qty 1

## 2024-09-11 MED ORDER — NAPROXEN 500 MG PO TABS: 500.0000 mg | ORAL_TABLET | Freq: Two times a day (BID) | ORAL | 0 refills | Status: AC

## 2024-09-11 MED ORDER — OXYCODONE HCL 5 MG PO TABS: 5.0000 mg | ORAL_TABLET | Freq: Four times a day (QID) | ORAL | 0 refills | Status: AC | PRN

## 2024-09-11 MED ORDER — KETOROLAC TROMETHAMINE 15 MG/ML IJ SOLN
15.0000 mg | Freq: Once | INTRAMUSCULAR | Status: DC
  Filled 2024-09-11: qty 1

## 2024-09-11 NOTE — ED Notes (Signed)
 Patient REFUSED vitals.

## 2024-09-11 NOTE — ED Provider Notes (Signed)
 " [facility] EMERGENCY DEPARTMENT AT [facility] Provider Note   CSN: [id number] Arrival date & time: 09/11/24  9268     Patient presents with: No chief complaint on file.   JEZEBELLE LEDWELL is a 63 y.o. female patient with past medical history of trapezius muscle strain, NSTEMI, diabetes, hypertension, GERD presents to emergency room with complaint of right sided shoulder pain that has been ongoing for 4 days.  She reports that she is just getting over a cough and this seems to exacerbate pain.  This area is also worse when moving her right arm and tender to the touch.  She has not noticed any chest pain shortness of breath.  She does not continue to have any cough or fever.  No other injury trauma or fall.  No swelling in lower extremities or history of DVT/PE.   HPI     Prior to Admission medications  Medication Sig Start Date End Date Taking? Authorizing Provider  acetaminophen  (TYLENOL ) 500 MG tablet Take 2 tablets (1,000 mg total) by mouth every 8 (eight) hours as needed. 01/07/24   Howell Lunger, DO  aspirin  EC (ASPIRIN  LOW DOSE) 81 MG tablet TAKE 1 TABLET BY MOUTH DAILY, SWALLOW WHOLE 05/18/24   Vicci Rollo SAUNDERS, PA-C  atorvastatin  (LIPITOR) 40 MG tablet Take 1 tablet (40 mg total) by mouth daily. 10/26/23   Theophilus Pagan, MD  azelastine  (ASTELIN ) 0.1 % nasal spray 1 puff in each nostril Nasally Twice a day 10/16/23   Romelle Booty, MD  fluticasone  (FLONASE ) 50 MCG/ACT nasal spray INSTILL 2 SPRAY IN EACH NOSTRIL EVERY DAY 12/16/22   Theophilus Pagan, MD  metoprolol  tartrate (LOPRESSOR ) 25 MG tablet TAKE 1/2 TABLET(12.5 MG) BY MOUTH TWICE DAILY 05/18/24   Johnson, Kathleen R, PA-C  olmesartan -hydrochlorothiazide  (BENICAR  HCT) 40-25 MG tablet TAKE 1 TABLET BY MOUTH DAILY 03/28/24   Theophilus Pagan, MD  omeprazole  (PRILOSEC) 40 MG capsule TAKE 1 CAPSULE(40 MG) BY MOUTH DAILY 11/10/23   Theophilus Pagan, MD  sodium chloride  (OCEAN) 0.65 % nasal spray Place into the nose as  needed for congestion.    [provider]    Allergies: Lisinopril, Moxifloxacin, Sulfa antibiotics, and Sulfonamide derivatives    Review of Systems  Updated Vital Signs BP (!) 150/80 (BP Location: Left Arm)   Pulse 65   Temp 97.7 F (36.5 C) (Oral)   Resp 16   Ht 5' 6 (1.676 m)   Wt 87.1 kg   SpO2 100%   BMI 30.99 kg/m   Physical Exam Vitals and nursing note reviewed.  Constitutional:      General: She is not in acute distress.    Appearance: She is not toxic-appearing.  HENT:     Head: Normocephalic and atraumatic.  Eyes:     General: No scleral icterus.    Conjunctiva/sclera: Conjunctivae normal.  Cardiovascular:     Rate and Rhythm: Normal rate and regular rhythm.     Pulses: Normal pulses.     Heart sounds: Normal heart sounds.  Pulmonary:     Effort: Pulmonary effort is normal. No respiratory distress.     Breath sounds: Normal breath sounds.  Abdominal:     General: Abdomen is flat. Bowel sounds are normal.     Palpations: Abdomen is soft.     Tenderness: There is no abdominal tenderness.  Musculoskeletal:       Arms:     Right lower leg: No edema.     Left lower leg: No edema.  Skin:    General: Skin is warm and dry.     Findings: No lesion.  Neurological:     General: No focal deficit present.     Mental Status: She is alert and oriented to person, place, and time. Mental status is at baseline.     (all labs ordered are listed, but only abnormal results are displayed) Labs Reviewed - No data to display  EKG: None  Radiology: DG Chest 2 View Result Date: 09/10/2024 CLINICAL DATA:  Upper right shoulder blade pain when breathing. EXAM: CHEST - 2 VIEW COMPARISON:  September 05, 2024 FINDINGS: The heart size and mediastinal contours are within normal limits. Both lungs are clear. Radiopaque surgical clips are seen within the right upper quadrant. The visualized skeletal structures are unremarkable. IMPRESSION: No active cardiopulmonary  disease. Electronically Signed   By: Suzen Dials M.D.   On: 09/10/2024 17:10     Procedures   Medications Ordered in the ED  lidocaine  (LIDODERM ) 5 % 1 patch (has no administration in time range)  ketorolac  (TORADOL ) 15 MG/ML injection 15 mg (has no administration in time range)                                    Medical Decision Making Risk Prescription drug management.   This patient presents to the ED for concern of right upper back pain, this involves an extensive number of treatment options, and is a complaint that carries with it a high risk of complications and morbidity.  The differential diagnosis includes aortic dissection, pulmonary embolism, ACS, fracture, pneumonia, muscle spasm   Imaging Studies ordered:  Patient had imaging done yesterday 09/10/2024 but was unable to see provider secondary to wait time.  Ended up leaving before being seen.  I reviewed these results with her.   Problem List / ED Course / Critical interventions / Medication management  Patient presents to emergency room with right sided upper back pain.  This is located around her shoulder blade.  It is reproducible on exam.  She also has reproducible exam when she is moving her right arm.  She is neurovascularly intact.  Upper extremity sensation and strength are intact bilaterally.  She has no tenderness over cervical thoracic or lumbar spine.  She had no trauma.  She denies any chest pain shortness of breath or continued cough.  She has had imaging that is overall reassuring.  I do suspect that this is muscular in nature.  I offered medications here which she ultimately declined.  She has ultimately declined repeat vital signs as well.  I do think she is stable for conservative management of Tylenol , NSAIDs ice heat lidocaine  patch over area of pain.  She should follow-up with primary care and she was given return precautions.       Final diagnoses:  Muscle spasm  Upper back pain on right  side    ED Discharge Orders     None          Shermon Warren SAILOR, PA-C 09/11/24 1126    Ellouise Richerd POUR, DO 09/11/24 1147  "

## 2024-09-11 NOTE — Discharge Instructions (Signed)
 Follow-up with your primary care doctor in approximately 3 to 5 days for recheck of symptoms.  Take 1000mg  of Tylneol every 8 hours for pain. Take Naprosyn  every 12 hours for pain. Take Oxycodone  for breakthrough pain. Use ice and heat over area of pain.  Return to ER with new or worsening symptoms.

## 2024-09-11 NOTE — ED Triage Notes (Signed)
 Pt ambulatory to triage with complaints of RIGHT sided lower back paint that has been ongoing for days. Pt states that she was here yesterday, and the pain has not improved.

## 2024-09-13 ENCOUNTER — Ambulatory Visit (INDEPENDENT_AMBULATORY_CARE_PROVIDER_SITE_OTHER): Admitting: Student

## 2024-09-13 VITALS — BP 141/70 | HR 74 | Wt 195.8 lb

## 2024-09-13 DIAGNOSIS — I10 Essential (primary) hypertension: Secondary | ICD-10-CM

## 2024-09-13 DIAGNOSIS — R051 Acute cough: Secondary | ICD-10-CM | POA: Diagnosis not present

## 2024-09-13 DIAGNOSIS — H6123 Impacted cerumen, bilateral: Secondary | ICD-10-CM

## 2024-09-13 MED ORDER — DEBROX 6.5 % OT SOLN: 5.0000 [drp] | Freq: Two times a day (BID) | OTIC | 0 refills | Status: AC

## 2024-09-13 MED ORDER — GUAIFENESIN ER 600 MG PO TB12: 600.0000 mg | ORAL_TABLET | Freq: Two times a day (BID) | ORAL | 0 refills | Status: AC

## 2024-09-13 NOTE — Assessment & Plan Note (Addendum)
 BP above goal with acute illness  Recheck at next visit

## 2024-09-13 NOTE — Progress Notes (Addendum)
" ° ° °  SUBJECTIVE:   CHIEF COMPLAINT / HPI:   Mariah [name] is a 63 y.o. female  presenting for acute cough and congestion.   Symptoms started 12/28 after going to the ED. She reports having phlegm in the back of her throat that is hard to cough up.  Medications tried at home honey, tea.  Tmax: subjective  Sick contacts: yes Signs of respiratory distress: denies, breathing comfortably on RA  Appetite: normal  Hydration: normal   PERTINENT  PMH / PSH: Reviewed and updated   OBJECTIVE:   BP (!) 141/70   Pulse 74   Wt 195 lb 12.8 oz (88.8 kg)   SpO2 100%   BMI 31.60 kg/m   Well-appearing, no acute distress HEENT: erythematous nasal turbinates, mild cervical lymphadenopathy bilaterally. Mild maxillary sinus tenderness, Cerumen impaction bilaterally  Cardio: Regular rate, regular rhythm, no murmurs on exam. <2 sec capillary refill  Pulm: Clear, no wheezing, no crackles. No increased work of breathing Abdominal: bowel sounds present, soft, non-tender, non-distended  ASSESSMENT/PLAN:   Assessment & Plan Acute cough No signs of respiratory distress on exam. Patient appears well hydrated.  Most likely viral mediated, supportive therapy indicated with return precautions for trouble breathing or persistent fever.  Prescribing Mucinex for phlegm BID  Bilateral impacted cerumen Hard cerumen on exam  Prescribed Debrox  Recheck at next visit  Benign essential hypertension BP above goal with acute illness  Recheck at next visit     Damien Pinal, DO [facility]  "

## 2024-09-13 NOTE — Patient Instructions (Addendum)
 It was great to see you today!   I have sent Mucinex to your pharmacy. Take this twice per day for the next 10 days   Continue supportive treatment at home with honey and Tumeric   I have sent ear drops to your pharmacy to help thin the wax. Use these daily for 7 days and then 2-3 times per week after    Future Appointments  Date Time Provider Department Center  09/21/2024  9:30 AM Genelle Standing, MD [facility] [id number]  09/22/2024  7:20 AM GI-315 MR 3 GI-[facility]MRI [facility]  09/22/2024  7:50 AM GI-315 MR 3 GI-315MRI [facility]  11/04/2024  7:30 AM [facility] VASC 2 [facility] None  11/04/2024  8:45 AM Dew, Selinda RAMAN, MD [facility] None    Please arrive 15 minutes before your appointment to ensure smooth check in process.  If you are more than 15 minutes late, you may be asked to reschedule.   Please bring a list of your medications with you to all appointments.   Please call the clinic at [phone] if your symptoms worsen or you have any concerns.  Thank you for allowing me to participate in your care, Dr. Damien Pinal [facility]

## 2024-09-14 ENCOUNTER — Telehealth: Payer: Self-pay

## 2024-09-14 MED ORDER — BENZONATATE 100 MG PO CAPS: 100.0000 mg | ORAL_CAPSULE | Freq: Two times a day (BID) | ORAL | 0 refills | Status: AC | PRN

## 2024-09-14 NOTE — Telephone Encounter (Signed)
 Patient calls nurse line in regards to medications prescribed yesterday.   She reports the medications are OTC and reports her insurance will not pay for them.   She is requesting replacement medications that are not OTC for insurance coverage.   Advised will forward to provider who saw patient for concern.

## 2024-09-14 NOTE — Telephone Encounter (Signed)
 09/14/24 LVM letting pt know orthotics are in and to call back to schedule an appt

## 2024-09-14 NOTE — Telephone Encounter (Signed)
 Called patient to discuss medications as Mucinex was not covered by insurance.   Sent in prescription for Tessalon  pearls but patient was not wanting to take a cough suppressant.   Attempted to call patient but was unavailable. Left HIPAA compliant voicemail.   Mucinex is the only thing we can offer for an expectorant.   Damien Pinal, DO [facility], PGY-3 09/14/2024 4:56 PM

## 2024-09-14 NOTE — Telephone Encounter (Signed)
 Called patient to advise of update.   She states that she is specifically requesting a cough expectorant not a suppressant.   She feels that Tessalon  will not help her as she is needing something to help her get this mess up.   Please advise.   Chiquita JAYSON English, RN

## 2024-09-19 ENCOUNTER — Encounter (HOSPITAL_BASED_OUTPATIENT_CLINIC_OR_DEPARTMENT_OTHER): Payer: Self-pay | Admitting: Orthopaedic Surgery

## 2024-09-21 ENCOUNTER — Encounter (HOSPITAL_BASED_OUTPATIENT_CLINIC_OR_DEPARTMENT_OTHER): Payer: Self-pay

## 2024-09-21 ENCOUNTER — Ambulatory Visit (HOSPITAL_BASED_OUTPATIENT_CLINIC_OR_DEPARTMENT_OTHER): Payer: Self-pay | Admitting: Orthopaedic Surgery

## 2024-09-21 ENCOUNTER — Ambulatory Visit (HOSPITAL_BASED_OUTPATIENT_CLINIC_OR_DEPARTMENT_OTHER): Admitting: Orthopaedic Surgery

## 2024-09-21 DIAGNOSIS — M25512 Pain in left shoulder: Secondary | ICD-10-CM

## 2024-09-21 DIAGNOSIS — M25511 Pain in right shoulder: Secondary | ICD-10-CM

## 2024-09-21 DIAGNOSIS — M12811 Other specific arthropathies, not elsewhere classified, right shoulder: Secondary | ICD-10-CM

## 2024-09-21 DIAGNOSIS — G8929 Other chronic pain: Secondary | ICD-10-CM

## 2024-09-21 MED ORDER — OXYCODONE HCL 5 MG PO TABS: 5.0000 mg | ORAL_TABLET | ORAL | 0 refills | Status: AC | PRN

## 2024-09-21 MED ORDER — ACETAMINOPHEN 500 MG PO TABS: 500.0000 mg | ORAL_TABLET | Freq: Three times a day (TID) | ORAL | 0 refills | Status: AC

## 2024-09-21 MED ORDER — ASPIRIN 325 MG PO TBEC: 325.0000 mg | DELAYED_RELEASE_TABLET | Freq: Every day | ORAL | 0 refills | Status: AC

## 2024-09-21 NOTE — Progress Notes (Signed)
 "   Chief Complaint: Bilateral shoulder pain, bilateral hand pain and numbness     History of Present Illness:   09/21/2024: Presents today predominantly for her right shoulder which she is still painful with overhead activity  Mariah [name] is a 64 y.o. female right-hand-dominant who presents with right worse than left shoulder pain.  She is status post right shoulder rotator cuff repair with Dr. Beverley several years prior.  She has been having persistent pain on this left side.  She did do physical therapy following her initial surgery for both shoulders.  At this time she has been having overhead limited range of motion.  She has been having difficulty with ADLs.  She is also endorsing some hand numbness as well throughout all of the digits right worse than left which has been progressive    PMH/PSH/Family History/Social History/Meds/Allergies:    Past Medical History:  Diagnosis Date   Cramp of muscle of left upper extremity 06/24/2019   Decreased peripheral vision of left eye 08/19/2019   Degenerative disc disease, lumbar    Ear pain 11/21/2019   Elevated hemoglobin A1c 10/26/2019   Elevated hemoglobin A1c 11/23/2019   GERD (gastroesophageal reflux disease)    Hypertension    Insomnia 07/11/2010   Qualifier: Diagnosis of  By: Zackary MD, [facility]     Mitral valve prolapse    Nasal congestion 04/01/2019   Numbness and tingling of right hand 01/29/2017   Pain and swelling of right knee 12/29/2019   Postprandial bloating 04/01/2019   Pre-diabetes    Prediabetes 10/24/2016   Shortness of breath 11/21/2019   TIA (transient ischemic attack)    Past Surgical History:  Procedure Laterality Date   ABDOMINAL HYSTERECTOMY     ABDOMINAL HYSTERECTOMY     BACK SURGERY     CHOLECYSTECTOMY     JOINT REPLACEMENT     KNEE SURGERY     LEFT HEART CATH AND CORONARY ANGIOGRAPHY N/A 10/14/2023   Procedure: LEFT HEART CATH AND CORONARY ANGIOGRAPHY;  Surgeon: Jordan, Peter M, MD;   Location: [facility] INVASIVE CV LAB;  Service: Cardiovascular;  Laterality: N/A;   LOWER EXTREMITY ANGIOGRAPHY Left 06/10/2022   Procedure: Lower Extremity Angiography;  Surgeon: Jama Cordella MATSU, MD;  Location: [facility] INVASIVE CV LAB;  Service: Cardiovascular;  Laterality: Left;   Social History   Socioeconomic History   Marital status: Divorced    Spouse name: Not on file   Number of children: Not on file   Years of education: Not on file   Highest education level: Some college, no degree  Occupational History   Not on file  Tobacco Use   Smoking status: Former    Current packs/day: 0.00    Types: Cigarettes    Quit date: 09/16/1995    Years since quitting: 29.0    Passive exposure: Past   Smokeless tobacco: Never  Substance and Sexual Activity   Alcohol use: Yes    Alcohol/week: 5.0 standard drinks of alcohol    Types: 5 Cans of beer per week   Drug use: Yes    Types: Marijuana   Sexual activity: Yes    Birth control/protection: Condom  Other Topics Concern   Not on file  Social History Narrative   Not on file   Social Drivers of Health   Tobacco Use: Medium Risk (09/13/2024)   Patient History    Smoking Tobacco Use: Former    Smokeless Tobacco Use: Never    Passive Exposure: Past  Physicist, Medical  Strain: High Risk (01/26/2024)   Overall Financial Resource Strain (CARDIA)    Difficulty of Paying Living Expenses: Hard  Food Insecurity: Food Insecurity Present (01/26/2024)   Hunger Vital Sign    Worried About Running Out of Food in the Last Year: Sometimes true    Ran Out of Food in the Last Year: Patient declined  Transportation Needs: No Transportation Needs (01/26/2024)   PRAPARE - Administrator, Civil Service (Medical): No    Lack of Transportation (Non-Medical): No  Physical Activity: Insufficiently Active (01/26/2024)   Exercise Vital Sign    Days of Exercise per Week: 2 days    Minutes of Exercise per Session: 20 min  Stress: No Stress Concern Present  (01/26/2024)   [employer] of Occupational Health - Occupational Stress Questionnaire    Feeling of Stress : Only a little  Social Connections: Unknown (01/26/2024)   Social Connection and Isolation Panel    Frequency of Communication with Friends and Family: More than three times a week    Frequency of Social Gatherings with Friends and Family: Patient declined    Attends Religious Services: More than 4 times per year    Active Member of Clubs or Organizations: Yes    Attends Banker Meetings: 1 to 4 times per year    Marital Status: Patient declined  Depression (PHQ2-9): Low Risk (09/05/2024)   Depression (PHQ2-9)    PHQ-2 Score: 1  Alcohol Screen: Low Risk (01/26/2024)   Alcohol Screen    Last Alcohol Screening Score (AUDIT): 3  Housing: High Risk (01/26/2024)   Housing Stability Vital Sign    Unable to Pay for Housing in the Last Year: Yes    Number of Times Moved in the Last Year: 0    Homeless in the Last Year: No  Utilities: Not At Risk (10/15/2023)   AHC Utilities    Threatened with loss of utilities: No  Health Literacy: Not on file   Family History  Problem Relation Age of Onset   Lymphoma Mother    Lung cancer Father    Pulmonary embolism Brother    Colon cancer Neg Hx    Esophageal cancer Neg Hx    Liver cancer Neg Hx    Pancreatic cancer Neg Hx    Rectal cancer Neg Hx    Stomach cancer Neg Hx    Allergies  Allergen Reactions   Lisinopril     REACTION: COUGH   Moxifloxacin     REACTION: N \\T \ V   Sulfa Antibiotics     Other reaction(s): Other (See Comments), Other (See Comments) Pt cannot remember rxn Pt cannot remember rxn    Sulfonamide Derivatives Other (See Comments)    Pt cannot remember rxn   Current Outpatient Medications  Medication Sig Dispense Refill   acetaminophen  (TYLENOL ) 500 MG tablet Take 1 tablet (500 mg total) by mouth every 8 (eight) hours for 10 days. 30 tablet 0   aspirin  EC 325 MG tablet Take 1 tablet (325 mg  total) by mouth daily. 14 tablet 0   oxyCODONE  (ROXICODONE ) 5 MG immediate release tablet Take 1 tablet (5 mg total) by mouth every 4 (four) hours as needed for severe pain (pain score 7-10) or breakthrough pain. 15 tablet 0   acetaminophen  (TYLENOL ) 500 MG tablet Take 2 tablets (1,000 mg total) by mouth every 8 (eight) hours as needed.     aspirin  EC (ASPIRIN  LOW DOSE) 81 MG tablet TAKE 1 TABLET BY MOUTH DAILY,  SWALLOW WHOLE 90 tablet 3   atorvastatin  (LIPITOR) 40 MG tablet Take 1 tablet (40 mg total) by mouth daily.     azelastine  (ASTELIN ) 0.1 % nasal spray 1 puff in each nostril Nasally Twice a day 30 mL 2   benzonatate  (TESSALON ) 100 MG capsule Take 1 capsule (100 mg total) by mouth 2 (two) times daily as needed for cough. 20 capsule 0   carbamide peroxide (DEBROX) 6.5 % OTIC solution Place 5 drops into both ears 2 (two) times daily. 15 mL 0   fluticasone  (FLONASE ) 50 MCG/ACT nasal spray INSTILL 2 SPRAY IN EACH NOSTRIL EVERY DAY 16 g 11   guaiFENesin  (MUCINEX ) 600 MG 12 hr tablet Take 1 tablet (600 mg total) by mouth 2 (two) times daily for 10 days. 20 tablet 0   metoprolol  tartrate (LOPRESSOR ) 25 MG tablet TAKE 1/2 TABLET(12.5 MG) BY MOUTH TWICE DAILY 90 tablet 3   naproxen  (NAPROSYN ) 500 MG tablet Take 1 tablet (500 mg total) by mouth 2 (two) times daily. 30 tablet 0   olmesartan -hydrochlorothiazide  (BENICAR  HCT) 40-25 MG tablet TAKE 1 TABLET BY MOUTH DAILY 90 tablet 1   omeprazole  (PRILOSEC) 40 MG capsule TAKE 1 CAPSULE(40 MG) BY MOUTH DAILY 90 capsule 3   oxyCODONE  (ROXICODONE ) 5 MG immediate release tablet Take 1 tablet (5 mg total) by mouth every 6 (six) hours as needed for breakthrough pain. 7 tablet 0   sodium chloride  (OCEAN) 0.65 % nasal spray Place into the nose as needed for congestion.     No current facility-administered medications for this visit.   No results found.  Review of Systems:   A ROS was performed including pertinent positives and negatives as documented in the  HPI.  Physical Exam :   Constitutional: NAD and appears stated age Neurological: Alert and oriented Psych: Appropriate affect and cooperative There were no vitals taken for this visit.   Comprehensive Musculoskeletal Exam:    Bilateral positive Tinel as well as Durkan positive Neer impingement bilaterally.  Forward elevation is to 160 degrees external rotation with side to 45 degrees internal rotation is to L1 with pain   Imaging:   Xray (3 views right shoulder, 3 views left shoulder): Decreased acromiohumeral interval on the right as well as left consistent with prior rotator cuff pathology    I personally reviewed and interpreted the radiographs.   Assessment and Plan:   64 y.o. female with bilateral evidence of right as well as left rotator cuff pathology status post right rotator cuff repair with Dr. Beverley 2 years prior.  At this time she has now failed rotator cuff repair on the right side.  Given this we will plan for a right shoulder reverse shoulder arthroplasty.  Discussed wrist limitations as well as associate recovery timeframe.  I would like to send her to Dr. Eldonna for EMG/nerve conduction test of bilateral hands that she does appear to have evidence of carpal tunnel syndrome.  Will plan for referral to Dr. Erwin for this as well  - Plan for right shoulder reverse shoulder arthroplasty   After a lengthy discussion of treatment options, including risks, benefits, alternatives, complications of surgical and nonsurgical conservative options, the patient elected surgical repair.   The patient  is aware of the material risks  and complications including, but not limited to injury to adjacent structures, neurovascular injury, infection, numbness, bleeding, implant failure, thermal burns, stiffness, persistent pain, failure to heal, disease transmission from allograft, need for further surgery, dislocation, anesthetic risks, blood  clots, risks of death,and others. The  probabilities of surgical success and failure discussed with patient given their particular co-morbidities.The time and nature of expected rehabilitation and recovery was discussed.The patient's questions were all answered preoperatively.  No barriers to understanding were noted. I explained the natural history of the disease process and Rx rationale.  I explained to the patient what I considered to be reasonable expectations given their personal situation.  The final treatment plan was arrived at through a shared patient decision making process model.    I personally saw and evaluated the patient, and participated in the management and treatment plan.  Elspeth Parker, MD Attending Physician, Orthopedic Surgery  This document was dictated using Dragon voice recognition software. A reasonable attempt at proof reading has been made to minimize errors. "

## 2024-09-21 NOTE — Addendum Note (Signed)
 Addended by: WOLFGANG CONLEY HERO on: 09/21/2024 12:31 PM   Modules accepted: Orders

## 2024-09-22 ENCOUNTER — Other Ambulatory Visit

## 2024-09-26 ENCOUNTER — Other Ambulatory Visit: Payer: Self-pay | Admitting: Family Medicine

## 2024-09-26 DIAGNOSIS — E119 Type 2 diabetes mellitus without complications: Secondary | ICD-10-CM

## 2024-09-28 MED ORDER — ATORVASTATIN CALCIUM 40 MG PO TABS: 40.0000 mg | ORAL_TABLET | Freq: Every day | ORAL | 3 refills | Status: AC

## 2024-09-28 NOTE — Addendum Note (Signed)
 Addended by: Deliana Avalos C on: 09/28/2024 09:54 AM   Modules accepted: Orders

## 2024-09-28 NOTE — Telephone Encounter (Signed)
 Patient calls nurse line regarding refill request for atorvastatin .   Please advise.   Chiquita JAYSON English, RN

## 2024-09-30 ENCOUNTER — Encounter: Payer: Self-pay | Admitting: Family Medicine

## 2024-10-03 ENCOUNTER — Ambulatory Visit

## 2024-10-05 ENCOUNTER — Other Ambulatory Visit (HOSPITAL_BASED_OUTPATIENT_CLINIC_OR_DEPARTMENT_OTHER): Payer: Self-pay | Admitting: Orthopaedic Surgery

## 2024-10-05 ENCOUNTER — Encounter (HOSPITAL_BASED_OUTPATIENT_CLINIC_OR_DEPARTMENT_OTHER): Payer: Self-pay

## 2024-10-05 DIAGNOSIS — M12811 Other specific arthropathies, not elsewhere classified, right shoulder: Secondary | ICD-10-CM

## 2024-10-07 ENCOUNTER — Other Ambulatory Visit: Payer: Self-pay | Admitting: Family Medicine

## 2024-10-07 DIAGNOSIS — I10 Essential (primary) hypertension: Secondary | ICD-10-CM

## 2024-10-11 ENCOUNTER — Ambulatory Visit: Admitting: Orthopedic Surgery

## 2024-10-21 ENCOUNTER — Other Ambulatory Visit: Payer: Self-pay | Admitting: Family Medicine

## 2024-10-26 ENCOUNTER — Ambulatory Visit: Attending: Orthopedic Surgery

## 2024-11-04 ENCOUNTER — Encounter (INDEPENDENT_AMBULATORY_CARE_PROVIDER_SITE_OTHER): Payer: Medicaid Other

## 2024-11-04 ENCOUNTER — Ambulatory Visit (INDEPENDENT_AMBULATORY_CARE_PROVIDER_SITE_OTHER): Payer: Medicaid Other | Admitting: Vascular Surgery
